# Patient Record
Sex: Male | Born: 1965 | Race: Black or African American | Hispanic: No | Marital: Married | Smoking: Never smoker
Health system: Southern US, Community
[De-identification: ages and names within clinical notes are randomized; demographics above are authoritative.]

## PROBLEM LIST (undated history)

## (undated) DIAGNOSIS — K219 Gastro-esophageal reflux disease without esophagitis: Secondary | ICD-10-CM

## (undated) DIAGNOSIS — K760 Fatty (change of) liver, not elsewhere classified: Secondary | ICD-10-CM

## (undated) DIAGNOSIS — J45909 Unspecified asthma, uncomplicated: Secondary | ICD-10-CM

## (undated) DIAGNOSIS — J189 Pneumonia, unspecified organism: Secondary | ICD-10-CM

## (undated) DIAGNOSIS — M4712 Other spondylosis with myelopathy, cervical region: Secondary | ICD-10-CM

## (undated) DIAGNOSIS — I10 Essential (primary) hypertension: Secondary | ICD-10-CM

## (undated) DIAGNOSIS — D649 Anemia, unspecified: Secondary | ICD-10-CM

## (undated) DIAGNOSIS — G610 Guillain-Barre syndrome: Secondary | ICD-10-CM

## (undated) DIAGNOSIS — R011 Cardiac murmur, unspecified: Secondary | ICD-10-CM

## (undated) HISTORY — PX: WISDOM TOOTH EXTRACTION: SHX21

## (undated) HISTORY — DX: Unspecified asthma, uncomplicated: J45.909

## (undated) HISTORY — DX: Fatty (change of) liver, not elsewhere classified: K76.0

## (undated) HISTORY — PX: NO PAST SURGERIES: SHX2092

---

## 1998-06-14 ENCOUNTER — Emergency Department (HOSPITAL_COMMUNITY): Admission: EM | Admit: 1998-06-14 | Discharge: 1998-06-14 | Payer: Self-pay | Admitting: Emergency Medicine

## 1998-06-15 ENCOUNTER — Emergency Department (HOSPITAL_COMMUNITY): Admission: EM | Admit: 1998-06-15 | Discharge: 1998-06-15 | Payer: Self-pay | Admitting: Emergency Medicine

## 1998-09-14 ENCOUNTER — Emergency Department (HOSPITAL_COMMUNITY): Admission: EM | Admit: 1998-09-14 | Discharge: 1998-09-14 | Payer: Self-pay | Admitting: Emergency Medicine

## 2001-03-07 ENCOUNTER — Emergency Department (HOSPITAL_COMMUNITY): Admission: EM | Admit: 2001-03-07 | Discharge: 2001-03-07 | Payer: Self-pay

## 2004-06-30 ENCOUNTER — Emergency Department (HOSPITAL_COMMUNITY): Admission: EM | Admit: 2004-06-30 | Discharge: 2004-06-30 | Payer: Self-pay | Admitting: Emergency Medicine

## 2009-02-15 ENCOUNTER — Emergency Department (HOSPITAL_BASED_OUTPATIENT_CLINIC_OR_DEPARTMENT_OTHER): Admission: EM | Admit: 2009-02-15 | Discharge: 2009-02-15 | Payer: Self-pay | Admitting: Emergency Medicine

## 2009-08-30 ENCOUNTER — Emergency Department (HOSPITAL_COMMUNITY): Admission: EM | Admit: 2009-08-30 | Discharge: 2009-08-30 | Payer: Self-pay | Admitting: Emergency Medicine

## 2010-02-03 ENCOUNTER — Emergency Department (HOSPITAL_COMMUNITY): Admission: EM | Admit: 2010-02-03 | Discharge: 2010-02-03 | Payer: Self-pay | Admitting: Emergency Medicine

## 2010-05-31 LAB — POCT I-STAT, CHEM 8
BUN: 16 mg/dL (ref 6–23)
Calcium, Ion: 1.11 mmol/L — ABNORMAL LOW (ref 1.12–1.32)
Chloride: 102 mEq/L (ref 96–112)
Creatinine, Ser: 1.5 mg/dL (ref 0.4–1.5)
Glucose, Bld: 104 mg/dL — ABNORMAL HIGH (ref 70–99)
HCT: 46 % (ref 39.0–52.0)
Hemoglobin: 15.6 g/dL (ref 13.0–17.0)
Potassium: 3.8 mEq/L (ref 3.5–5.1)
Sodium: 137 mEq/L (ref 135–145)
TCO2: 28 mmol/L (ref 0–100)

## 2010-06-22 LAB — POCT CARDIAC MARKERS
CKMB, poc: 1.1 ng/mL (ref 1.0–8.0)
CKMB, poc: <1 ng/mL — ABNORMAL LOW (ref 1.0–8.0)
Myoglobin, poc: 61.4 ng/mL (ref 12–200)
Myoglobin, poc: 65.7 ng/mL (ref 12–200)
Troponin i, poc: <0.05 ng/mL (ref 0.00–0.09)
Troponin i, poc: <0.05 ng/mL (ref 0.00–0.09)

## 2010-06-22 LAB — BASIC METABOLIC PANEL
BUN: 11 mg/dL (ref 6–23)
CO2: 28 mEq/L (ref 19–32)
Calcium: 9.2 mg/dL (ref 8.4–10.5)
Chloride: 103 mEq/L (ref 96–112)
Creatinine, Ser: 1 mg/dL (ref 0.4–1.5)
GFR calc Af Amer: >60 mL/min (ref >60)
GFR calc non Af Amer: >60 mL/min (ref >60)
Glucose, Bld: 105 mg/dL — ABNORMAL HIGH (ref 70–99)
Potassium: 4.2 mEq/L (ref 3.5–5.1)
Sodium: 140 mEq/L (ref 135–145)

## 2010-06-22 LAB — CBC
HCT: 40.2 % (ref 39.0–52.0)
Hemoglobin: 13.9 g/dL (ref 13.0–17.0)
MCHC: 34.5 g/dL (ref 30.0–36.0)
MCV: 90.3 fL (ref 78.0–100.0)
Platelets: 229 10*3/uL (ref 150–400)
RBC: 4.45 MIL/uL (ref 4.22–5.81)
RDW: 13.3 % (ref 11.5–15.5)
WBC: 5.9 10*3/uL (ref 4.0–10.5)

## 2010-11-24 ENCOUNTER — Other Ambulatory Visit: Payer: Self-pay

## 2010-11-24 ENCOUNTER — Emergency Department (INDEPENDENT_AMBULATORY_CARE_PROVIDER_SITE_OTHER): Payer: Self-pay

## 2010-11-24 ENCOUNTER — Emergency Department (HOSPITAL_BASED_OUTPATIENT_CLINIC_OR_DEPARTMENT_OTHER)
Admission: EM | Admit: 2010-11-24 | Discharge: 2010-11-24 | Disposition: A | Discharge status: Home / Self Care | Payer: Self-pay | Attending: Emergency Medicine | Admitting: Emergency Medicine

## 2010-11-24 DIAGNOSIS — I10 Essential (primary) hypertension: Secondary | ICD-10-CM | POA: Insufficient documentation

## 2010-11-24 DIAGNOSIS — R42 Dizziness and giddiness: Secondary | ICD-10-CM

## 2010-11-24 DIAGNOSIS — R0602 Shortness of breath: Secondary | ICD-10-CM | POA: Insufficient documentation

## 2010-11-24 DIAGNOSIS — R079 Chest pain, unspecified: Secondary | ICD-10-CM

## 2010-11-24 HISTORY — DX: Cardiac murmur, unspecified: R01.1

## 2010-11-24 HISTORY — DX: Essential (primary) hypertension: I10

## 2010-11-24 LAB — CBC
HCT: 41 % (ref 39.0–52.0)
Hemoglobin: 14.2 g/dL (ref 13.0–17.0)
MCV: 87.2 fL (ref 78.0–100.0)
RBC: 4.7 MIL/uL (ref 4.22–5.81)
WBC: 3.4 10*3/uL — ABNORMAL LOW (ref 4.0–10.5)

## 2010-11-24 LAB — CARDIAC PANEL(CRET KIN+CKTOT+MB+TROPI)
CK, MB: 2.3 ng/mL (ref 0.3–4.0)
Relative Index: 0.9 (ref 0.0–2.5)
Total CK: 263 U/L — ABNORMAL HIGH (ref 7–232)

## 2010-11-24 LAB — DIFFERENTIAL
Basophils Absolute: 0 10*3/uL (ref 0.0–0.1)
Eosinophils Relative: 1 % (ref 0–5)
Lymphocytes Relative: 26 % (ref 12–46)
Lymphs Abs: 0.9 10*3/uL (ref 0.7–4.0)
Monocytes Absolute: 0.4 10*3/uL (ref 0.1–1.0)
Neutro Abs: 2.1 10*3/uL (ref 1.7–7.7)

## 2010-11-24 LAB — BASIC METABOLIC PANEL
CO2: 26 mEq/L (ref 19–32)
Calcium: 9.5 mg/dL (ref 8.4–10.5)
Chloride: 99 mEq/L (ref 96–112)
Creatinine, Ser: 0.9 mg/dL (ref 0.50–1.35)
Glucose, Bld: 103 mg/dL — ABNORMAL HIGH (ref 70–99)

## 2010-11-24 NOTE — ED Notes (Signed)
C/o lightheaded, sob, head "pressure", chest tightness x days

## 2010-11-24 NOTE — ED Provider Notes (Signed)
History     CSN: [id number] Arrival date & time: 11/24/2010 11:25 AM  Chief Complaint  Patient presents with  . Dizziness  . Shortness of Breath   HPI Comments: Pt states that he is having intermittent, cp pressure, sob and lightheadedness:pt states that he has similar episode about 5 months ago and was seen in the er and sent home:pt states that he had a negative stress test with a cardiologist in [place] about 5 years ago:pt is not currently having any symptoms  Patient is a 45 y.o. male presenting with shortness of breath. The history is provided by the patient. No language interpreter was used.  Shortness of Breath  The current episode started 3 to 5 days ago. The onset was gradual. The problem occurs occasionally. The problem has been resolved. The problem is mild. The symptoms are relieved by nothing. The symptoms are aggravated by nothing. Associated symptoms include chest pressure and shortness of breath. Pertinent negatives include no fever. There was no intake of a foreign body. The Heimlich maneuver was not attempted. He was not exposed to toxic fumes. He has not inhaled smoke recently. He has had no prior hospitalizations. He has had no prior ICU admissions. He has had no prior intubations. He has been behaving normally. Urine output has been normal. There were no sick contacts.    Past Medical History  Diagnosis Date  . Hypertension   . Heart murmur     History reviewed. No pertinent past surgical history.  Family History  Problem Relation Age of Onset  . Hypertension Mother     History  Substance Use Topics  . Smoking status: Never Smoker   . Smokeless tobacco: Not on file  . Alcohol Use: No      Review of Systems  Constitutional: Negative for fever.  Respiratory: Positive for shortness of breath.   All other systems reviewed and are negative.    Physical Exam  BP 134/96  Pulse 68  Temp(Src) 98 F (36.7 C) (Oral)  Resp 20  Ht 5\' 11"  (1.803 m)  Wt  235 lb (106.595 kg)  BMI 32.78 kg/m2  SpO2 100%  Physical Exam  Nursing note and vitals reviewed. Constitutional: He is oriented to person, place, and time. He appears well-developed and well-nourished.  HENT:  Head: Atraumatic.  Eyes: Pupils are equal, round, and reactive to light.  Neck: Normal range of motion. Neck supple.  Cardiovascular: Normal rate and regular rhythm.   Pulmonary/Chest: Effort normal and breath sounds normal.  Abdominal: Soft. Bowel sounds are normal.  Musculoskeletal: Normal range of motion.  Neurological: He is alert and oriented to person, place, and time.  Skin: Skin is warm and dry.  Psychiatric: He has a normal mood and affect.    ED Course  Procedures Results for orders placed during the hospital encounter of 11/24/10  CBC      Component Value Range   WBC 3.4 (*) 4.0 - 10.5 (K/uL)   RBC 4.70  4.22 - 5.81 (MIL/uL)   Hemoglobin 14.2  13.0 - 17.0 (g/dL)   HCT 40.9  81.1 - 91.4 (%)   MCV 87.2  78.0 - 100.0 (fL)   MCH 30.2  26.0 - 34.0 (pg)   MCHC 34.6  30.0 - 36.0 (g/dL)   RDW 78.2  95.6 - 21.3 (%)   Platelets 240  150 - 400 (K/uL)  DIFFERENTIAL      Component Value Range   Neutrophils Relative 62  43 - 77 (%)  Neutro Abs 2.1  1.7 - 7.7 (K/uL)   Lymphocytes Relative 26  12 - 46 (%)   Lymphs Abs 0.9  0.7 - 4.0 (K/uL)   Monocytes Relative 11  3 - 12 (%)   Monocytes Absolute 0.4  0.1 - 1.0 (K/uL)   Eosinophils Relative 1  0 - 5 (%)   Eosinophils Absolute 0.0  0.0 - 0.7 (K/uL)   Basophils Relative 0  0 - 1 (%)   Basophils Absolute 0.0  0.0 - 0.1 (K/uL)  BASIC METABOLIC PANEL      Component Value Range   Sodium 136  135 - 145 (mEq/L)   Potassium 3.9  3.5 - 5.1 (mEq/L)   Chloride 99  96 - 112 (mEq/L)   CO2 26  19 - 32 (mEq/L)   Glucose, Bld 103 (*) 70 - 99 (mg/dL)   BUN 7  6 - 23 (mg/dL)   Creatinine, Ser 1.61  0.50 - 1.35 (mg/dL)   Calcium 9.5  8.4 - 09.6 (mg/dL)   GFR calc non Af Amer >60  >60 (mL/min)   GFR calc Af Amer >60  >60  (mL/min)  CARDIAC PANEL(CRET KIN+CKTOT+MB+TROPI)      Component Value Range   Total CK 263 (*) 7 - 232 (U/L)   CK, MB 2.3  0.3 - 4.0 (ng/mL)   Troponin I <0.30  <0.30 (ng/mL)   Relative Index 0.9  0.0 - 2.5    Dg Chest 2 View  11/24/2010  *RADIOLOGY REPORT*  Clinical Data: Chest pain and shortness of breath.  Dizziness and lightheadedness.  CHEST - 2 VIEW  Comparison: 08/30/2009  Findings: Heart size and mediastinal contours are normal.  Both lungs are clear.  No evidence of pleural effusion.  No mass or lymphadenopathy identified.  No significant change compared to prior exam.  IMPRESSION: Stable exam.  No active disease.  Original Report Authenticated By: Danae Orleans, M.D.    Date: 11/24/2010  Rate: 62  Rhythm: normal sinus rhythm  QRS Axis: normal  Intervals: normal  ST/T Wave abnormalities: normal  Conduction Disutrbances:none  Narrative Interpretation:   Old EKG Reviewed: none available   MDM Pt has been having symptoms for 3 days and pt is pain free at this time:will have pt follow up with his cardiologist for continued symptoms:pt not concerning for pe or pneumonia:no signs of failure noted   Medical screening examination/treatment/procedure(s) were performed by non-physician practitioner and as supervising physician I was immediately available for consultation/collaboration. Osvaldo Human, M.D.   Teressa Lower, NP 11/24/10 1531  Teressa Lower, NP 11/24/10 1533  Carleene Cooper III, MD 11/25/10 1027

## 2010-11-24 NOTE — ED Notes (Signed)
Pt amb to room 1 with quick steady gait in nad.  Pt reports feeling dizzy and at times sob since Monday, denies any dizzy feeling right now, or any sob.  Rt at bedside performing ekg and resp assessment during triage.

## 2010-11-24 NOTE — ED Notes (Signed)
Patient states he has had tightness in head and chest for 3 days. Patient states has no pain. I took vitals and had patient stay in waiting for nurse.

## 2011-09-11 ENCOUNTER — Encounter (HOSPITAL_BASED_OUTPATIENT_CLINIC_OR_DEPARTMENT_OTHER): Payer: Self-pay | Admitting: *Deleted

## 2011-09-11 ENCOUNTER — Emergency Department (HOSPITAL_BASED_OUTPATIENT_CLINIC_OR_DEPARTMENT_OTHER)
Admission: EM | Admit: 2011-09-11 | Discharge: 2011-09-11 | Disposition: A | Discharge status: Home / Self Care | Payer: 59 | Attending: Emergency Medicine | Admitting: Emergency Medicine

## 2011-09-11 DIAGNOSIS — I10 Essential (primary) hypertension: Secondary | ICD-10-CM | POA: Insufficient documentation

## 2011-09-11 DIAGNOSIS — J020 Streptococcal pharyngitis: Secondary | ICD-10-CM

## 2011-09-11 LAB — RAPID STREP SCREEN (MED CTR MEBANE ONLY): Streptococcus, Group A Screen (Direct): POSITIVE — AB

## 2011-09-11 MED ORDER — PENICILLIN G BENZATHINE 1200000 UNIT/2ML IM SUSP
1.2000 10*6.[IU] | Freq: Once | INTRAMUSCULAR | Status: AC
  Administered 2011-09-11: 1.2 10*6.[IU] via INTRAMUSCULAR
  Filled 2011-09-11: qty 2

## 2011-09-11 MED ORDER — IBUPROFEN 400 MG PO TABS
600.0000 mg | ORAL_TABLET | Freq: Once | ORAL | Status: AC
  Administered 2011-09-11: 600 mg via ORAL
  Filled 2011-09-11: qty 1

## 2011-09-11 NOTE — Discharge Instructions (Signed)

## 2011-09-11 NOTE — ED Provider Notes (Signed)
History     CSN: [id number]  Arrival date & time 09/11/11  0548   First MD Initiated Contact with Patient 09/11/11 [phone]      Chief Complaint  Patient presents with  . Sore Throat    (Consider location/radiation/quality/duration/timing/severity/associated sxs/prior treatment) HPI Comments: PT with sore throat since Wednesday, some HA, muscle achiness.  No N/V/D.  No sig coughing.  Has not taken any meds PTA, just trynig to tough it out to see if he would improve.  Pt's spouse now also sick with similar symptoms.  No prior h/o similar.    Patient is a 47 y.o. male presenting with pharyngitis. The history is provided by the patient and the spouse.  Sore Throat Associated symptoms include headaches.    Past Medical History  Diagnosis Date  . Hypertension   . Heart murmur     History reviewed. No pertinent past surgical history.  Family History  Problem Relation Age of Onset  . Hypertension Mother     History  Substance Use Topics  . Smoking status: Never Smoker   . Smokeless tobacco: Not on file  . Alcohol Use: No      Review of Systems  Constitutional: Positive for fever and chills.  HENT: Positive for sore throat. Negative for trouble swallowing and voice change.   Respiratory: Negative for cough.   Musculoskeletal: Positive for myalgias.  Skin: Negative for rash.  Neurological: Positive for headaches.    Allergies  Other  Home Medications   Current Outpatient Rx  Name Route Sig Dispense Refill  . EXFORGE PO Oral Take by mouth.        BP 149/96  Pulse 90  Temp 98.9 F (37.2 C)  SpO2 98%  Physical Exam  Nursing note and vitals reviewed. Constitutional: He appears well-developed and well-nourished. No distress.  HENT:  Mouth/Throat: Uvula is midline and mucous membranes are normal. Posterior oropharyngeal erythema present. No oropharyngeal exudate or tonsillar abscesses.  Neck: Phonation normal. Neck supple.  Pulmonary/Chest: Effort normal. No  stridor. No respiratory distress.  Lymphadenopathy:    He has cervical adenopathy.    ED Course  Procedures (including critical care time)  Labs Reviewed  RAPID STREP SCREEN - Abnormal; Notable for the following:    Streptococcus, Group A Screen (Direct) POSITIVE (*)     All other components within normal limits   No results found.   1. Strep pharyngitis       MDM  + strep, no sresp distress, will give IM PCN.  Work note.          Gavin Pound. Oletta Lamas, MD 09/11/11 [phone]

## 2011-09-11 NOTE — ED Notes (Signed)
Pt presents to ED today with sore throat and body aches since Wed.  Pt has tried no otc remedies PTA.

## 2011-12-24 ENCOUNTER — Emergency Department (HOSPITAL_BASED_OUTPATIENT_CLINIC_OR_DEPARTMENT_OTHER): Payer: 59

## 2011-12-24 ENCOUNTER — Emergency Department (HOSPITAL_BASED_OUTPATIENT_CLINIC_OR_DEPARTMENT_OTHER)
Admission: EM | Admit: 2011-12-24 | Discharge: 2011-12-24 | Disposition: A | Discharge status: Home / Self Care | Payer: 59 | Attending: Emergency Medicine | Admitting: Emergency Medicine

## 2011-12-24 ENCOUNTER — Encounter (HOSPITAL_BASED_OUTPATIENT_CLINIC_OR_DEPARTMENT_OTHER): Payer: Self-pay | Admitting: *Deleted

## 2011-12-24 DIAGNOSIS — Y9367 Activity, basketball: Secondary | ICD-10-CM | POA: Insufficient documentation

## 2011-12-24 DIAGNOSIS — Y998 Other external cause status: Secondary | ICD-10-CM | POA: Insufficient documentation

## 2011-12-24 DIAGNOSIS — I10 Essential (primary) hypertension: Secondary | ICD-10-CM | POA: Insufficient documentation

## 2011-12-24 DIAGNOSIS — W1801XA Striking against sports equipment with subsequent fall, initial encounter: Secondary | ICD-10-CM | POA: Insufficient documentation

## 2011-12-24 DIAGNOSIS — S62109A Fracture of unspecified carpal bone, unspecified wrist, initial encounter for closed fracture: Secondary | ICD-10-CM

## 2011-12-24 DIAGNOSIS — S52509A Unspecified fracture of the lower end of unspecified radius, initial encounter for closed fracture: Secondary | ICD-10-CM | POA: Insufficient documentation

## 2011-12-24 DIAGNOSIS — S52609A Unspecified fracture of lower end of unspecified ulna, initial encounter for closed fracture: Secondary | ICD-10-CM | POA: Insufficient documentation

## 2011-12-24 MED ORDER — OXYCODONE-ACETAMINOPHEN 5-325 MG PO TABS: 2.0000 | ORAL_TABLET | ORAL | Status: DC | PRN

## 2011-12-24 MED ORDER — OXYCODONE-ACETAMINOPHEN 5-325 MG PO TABS
2.0000 | ORAL_TABLET | Freq: Once | ORAL | Status: AC
  Administered 2011-12-24: 2 via ORAL
  Filled 2011-12-24 (×2): qty 2

## 2011-12-24 NOTE — ED Provider Notes (Signed)
History     CSN: [id number]  Arrival date & time 12/24/11  5284   First MD Initiated Contact with Patient 12/24/11 2032      Chief Complaint  Patient presents with  . Wrist Injury    (Consider location/radiation/quality/duration/timing/severity/associated sxs/prior treatment) Patient is a 46 y.o. male presenting with wrist pain. The history is provided by the patient. No language interpreter was used.  Wrist Pain This is a new problem. The current episode started today. The problem occurs constantly. Associated symptoms include joint swelling and myalgias. Nothing aggravates the symptoms. He has tried nothing for the symptoms. The treatment provided moderate relief.   Pt reports he was playing basketball and fell injuring her wrist Past Medical History  Diagnosis Date  . Hypertension   . Heart murmur     History reviewed. No pertinent past surgical history.  Family History  Problem Relation Age of Onset  . Hypertension Mother     History  Substance Use Topics  . Smoking status: Never Smoker   . Smokeless tobacco: Not on file  . Alcohol Use: No      Review of Systems  Musculoskeletal: Positive for myalgias and joint swelling.  All other systems reviewed and are negative.    Allergies  Other  Home Medications   Current Outpatient Rx  Name Route Sig Dispense Refill  . EXFORGE PO Oral Take by mouth.        BP 106/75  Pulse 84  Temp 98.9 F (37.2 C) (Oral)  Resp 20  Ht 5\' 11"  (1.803 m)  Wt 250 lb (113.399 kg)  BMI 34.87 kg/m2  SpO2 94%  Physical Exam  Nursing note and vitals reviewed. Constitutional: He is oriented to person, place, and time. He appears well-developed and well-nourished.  HENT:  Head: Normocephalic.  Musculoskeletal: He exhibits tenderness.       Swollen tender left wrist,  Decreased range of motion.  nv and ns intact  Neurological: He is alert and oriented to person, place, and time.  Skin: Skin is warm.  Psychiatric: He has a  normal mood and affect.    ED Course  Procedures (including critical care time)  Labs Reviewed - No data to display Dg Wrist Complete Left  12/24/2011  *RADIOLOGY REPORT*  Clinical Data: Post fall while playing basketball, now with pain and swelling  LEFT WRIST - COMPLETE 3+ VIEW  Comparison: None.  Findings:  There is a comminuted slightly impacted fracture of the distal radial metaphysis, apex volar, without definite intra-articular extension.   Minimally displaced fracture of the ulnar styloid process.  An osseous structures within the soft tissues dorsal to the distal carpal row may represent a concomitant triquetral fracture.  No definite fracture of the scaphoid.  No definite dislocation.  Expected adjacent soft tissue swelling and displace of the pronator quadratus fat pad.  No radiopaque foreign body.  IMPRESSION: 1.  Comminuted, slightly impacted fracture of the distal radial metaphysis without definite intra-articular extension. 2.  Minimally displaced fracture of the ulnar styloid process. 3.  Possible triquetral fracture.   Original Report Authenticated By: Waynard Reeds, M.D.      No diagnosis found.    MDM  I spoke to Dr. Melvyn Novas who advised to have pt see him in the office tomorrow at 4pm for evaluation.  Pt placed in a splint and sling        Lonia Skinner [place], [place] 12/24/11 2113

## 2011-12-24 NOTE — ED Notes (Signed)
PA at bedside.

## 2011-12-24 NOTE — ED Provider Notes (Signed)
Medical screening examination/treatment/procedure(s) were performed by non-physician practitioner and as supervising physician I was immediately available for consultation/collaboration.   Clevon Khader, MD 12/24/11 2328 

## 2011-12-24 NOTE — ED Notes (Signed)
Pt states he was playing bball and fell injuring his left wrist. Presents with obvious deformity. Ice, sling and splint applied at triage. +radial pulse. Moves fingers. Feels touch. Cap refill < 3 sec

## 2011-12-26 ENCOUNTER — Encounter (HOSPITAL_COMMUNITY): Payer: Self-pay | Admitting: *Deleted

## 2011-12-26 ENCOUNTER — Encounter (HOSPITAL_COMMUNITY): Payer: Self-pay | Admitting: Pharmacy Technician

## 2011-12-26 NOTE — Progress Notes (Signed)
I called and spoke with Wynona Canes at Dr. Bari Edward office and requested orders.

## 2011-12-27 ENCOUNTER — Encounter (HOSPITAL_COMMUNITY)
Admission: RE | Disposition: A | Discharge status: Home / Self Care | Payer: Self-pay | Source: Ambulatory Visit | Attending: Orthopedic Surgery

## 2011-12-27 ENCOUNTER — Ambulatory Visit (HOSPITAL_COMMUNITY)
Admission: RE | Admit: 2011-12-27 | Discharge: 2011-12-27 | Disposition: A | Discharge status: Home / Self Care | Payer: 59 | Source: Ambulatory Visit | Attending: Orthopedic Surgery | Admitting: Orthopedic Surgery

## 2011-12-27 ENCOUNTER — Encounter (HOSPITAL_COMMUNITY): Payer: Self-pay

## 2011-12-27 ENCOUNTER — Ambulatory Visit (HOSPITAL_COMMUNITY): Payer: 59

## 2011-12-27 DIAGNOSIS — Z01810 Encounter for preprocedural cardiovascular examination: Secondary | ICD-10-CM | POA: Insufficient documentation

## 2011-12-27 DIAGNOSIS — Z538 Procedure and treatment not carried out for other reasons: Secondary | ICD-10-CM | POA: Insufficient documentation

## 2011-12-27 SURGERY — CANCELLED PROCEDURE

## 2011-12-27 MED ORDER — CEFAZOLIN SODIUM-DEXTROSE 2-3 GM-% IV SOLR
2.0000 g | INTRAVENOUS | Status: DC
  Filled 2011-12-27: qty 50

## 2011-12-27 MED ORDER — CHLORHEXIDINE GLUCONATE 4 % EX LIQD: 60.0000 mL | Freq: Once | CUTANEOUS | Status: DC

## 2011-12-27 MED ORDER — MUPIROCIN 2 % EX OINT
TOPICAL_OINTMENT | Freq: Two times a day (BID) | CUTANEOUS | Status: DC
  Filled 2011-12-27 (×2): qty 22

## 2011-12-27 SURGICAL SUPPLY — 48 items
BANDAGE ELASTIC 3 VELCRO ST LF (GAUZE/BANDAGES/DRESSINGS) ×3 IMPLANT
BANDAGE ELASTIC 4 VELCRO ST LF (GAUZE/BANDAGES/DRESSINGS) ×3 IMPLANT
BANDAGE GAUZE ELAST BULKY 4 IN (GAUZE/BANDAGES/DRESSINGS) ×3 IMPLANT
BLADE SURG ROTATE 9660 (MISCELLANEOUS) IMPLANT
BNDG CMPR 9X4 STRL LF SNTH (GAUZE/BANDAGES/DRESSINGS) ×1
BNDG ESMARK 4X9 LF (GAUZE/BANDAGES/DRESSINGS) ×3 IMPLANT
CLOTH BEACON ORANGE TIMEOUT ST (SAFETY) ×3 IMPLANT
CORDS BIPOLAR (ELECTRODE) ×3 IMPLANT
COVER SURGICAL LIGHT HANDLE (MISCELLANEOUS) ×3 IMPLANT
CUFF TOURNIQUET SINGLE 18IN (TOURNIQUET CUFF) ×3 IMPLANT
CUFF TOURNIQUET SINGLE 24IN (TOURNIQUET CUFF) IMPLANT
DRAIN TLS ROUND 10FR (DRAIN) IMPLANT
DRAPE OEC MINIVIEW 54X84 (DRAPES) ×3 IMPLANT
DRAPE SURG 17X11 SM STRL (DRAPES) ×3 IMPLANT
DRSG ADAPTIC 3X8 NADH LF (GAUZE/BANDAGES/DRESSINGS) ×3 IMPLANT
ELECT REM PT RETURN 9FT ADLT (ELECTROSURGICAL)
ELECTRODE REM PT RTRN 9FT ADLT (ELECTROSURGICAL) IMPLANT
GAUZE SPONGE 4X4 16PLY XRAY LF (GAUZE/BANDAGES/DRESSINGS) ×3 IMPLANT
GLOVE BIOGEL PI IND STRL 8.5 (GLOVE) ×2 IMPLANT
GLOVE BIOGEL PI INDICATOR 8.5 (GLOVE) ×1
GLOVE SURG ORTHO 8.0 STRL STRW (GLOVE) ×3 IMPLANT
GOWN PREVENTION PLUS XLARGE (GOWN DISPOSABLE) ×3 IMPLANT
GOWN STRL NON-REIN LRG LVL3 (GOWN DISPOSABLE) ×9 IMPLANT
KIT BASIN OR (CUSTOM PROCEDURE TRAY) ×3 IMPLANT
KIT ROOM TURNOVER OR (KITS) ×3 IMPLANT
MANIFOLD NEPTUNE II (INSTRUMENTS) ×3 IMPLANT
NDL HYPO 25X1 1.5 SAFETY (NEEDLE) ×1 IMPLANT
NEEDLE HYPO 25X1 1.5 SAFETY (NEEDLE) ×2 IMPLANT
NS IRRIG 1000ML POUR BTL (IV SOLUTION) ×3 IMPLANT
PACK ORTHO EXTREMITY (CUSTOM PROCEDURE TRAY) ×3 IMPLANT
PAD ARMBOARD 7.5X6 YLW CONV (MISCELLANEOUS) ×6 IMPLANT
PAD CAST 4YDX4 CTTN HI CHSV (CAST SUPPLIES) ×2 IMPLANT
PADDING CAST COTTON 4X4 STRL (CAST SUPPLIES) ×2
SOAP 2 % CHG 4 OZ (WOUND CARE) ×3 IMPLANT
SPONGE GAUZE 4X4 12PLY (GAUZE/BANDAGES/DRESSINGS) ×3 IMPLANT
STRIP CLOSURE SKIN 1/2X4 (GAUZE/BANDAGES/DRESSINGS) IMPLANT
SUCTION FRAZIER TIP 10 FR DISP (SUCTIONS) ×3 IMPLANT
SUT ETHILON 4 0 PS 2 18 (SUTURE) ×3 IMPLANT
SUT MNCRL AB 4-0 PS2 18 (SUTURE) IMPLANT
SUT VIC AB 2-0 FS1 27 (SUTURE) ×3 IMPLANT
SUT VICRYL 4-0 PS2 18IN ABS (SUTURE) ×3 IMPLANT
SYR CONTROL 10ML LL (SYRINGE) IMPLANT
SYSTEM CHEST DRAIN TLS 7FR (DRAIN) IMPLANT
TOWEL OR 17X24 6PK STRL BLUE (TOWEL DISPOSABLE) ×3 IMPLANT
TOWEL OR 17X26 10 PK STRL BLUE (TOWEL DISPOSABLE) ×3 IMPLANT
TUBE CONNECTING 12X1/4 (SUCTIONS) ×3 IMPLANT
WATER STERILE IRR 1000ML POUR (IV SOLUTION) ×3 IMPLANT
YANKAUER SUCT BULB TIP NO VENT (SUCTIONS) IMPLANT

## 2011-12-27 NOTE — Progress Notes (Signed)
Dr. Orlan Leavens informed that the EKG was finished before I could stop it and he asked me to send it with patient. I informed him that I couldn't due to HIPPA restrictions and that Medical Records could fax it to the other location if they  call to request it.

## 2011-12-27 NOTE — Progress Notes (Signed)
Dr. Orlan Leavens called and asked how much we had done to get patient ready. I informed him that I was asking questions and we were doing an ekg. He said to hold off and asked to speak to patient.  Patient and patient's family spoke with Dr. Orlan Leavens then said that  Patient was to get dressed and that they were to go to another location for surgery.

## 2012-03-20 HISTORY — PX: WRIST SURGERY: SHX841

## 2012-08-24 DIAGNOSIS — R5381 Other malaise: Secondary | ICD-10-CM | POA: Insufficient documentation

## 2012-08-24 DIAGNOSIS — F411 Generalized anxiety disorder: Secondary | ICD-10-CM | POA: Insufficient documentation

## 2013-01-15 DIAGNOSIS — J45909 Unspecified asthma, uncomplicated: Secondary | ICD-10-CM | POA: Insufficient documentation

## 2013-01-15 DIAGNOSIS — J45901 Unspecified asthma with (acute) exacerbation: Secondary | ICD-10-CM | POA: Insufficient documentation

## 2013-10-15 ENCOUNTER — Emergency Department (HOSPITAL_COMMUNITY)
Admission: EM | Admit: 2013-10-15 | Discharge: 2013-10-15 | Disposition: A | Discharge status: Home / Self Care | Payer: BC Managed Care – PPO | Attending: Emergency Medicine | Admitting: Emergency Medicine

## 2013-10-15 ENCOUNTER — Encounter (HOSPITAL_COMMUNITY): Payer: Self-pay | Admitting: Emergency Medicine

## 2013-10-15 ENCOUNTER — Emergency Department (HOSPITAL_COMMUNITY): Payer: BC Managed Care – PPO

## 2013-10-15 DIAGNOSIS — R011 Cardiac murmur, unspecified: Secondary | ICD-10-CM | POA: Diagnosis not present

## 2013-10-15 DIAGNOSIS — I10 Essential (primary) hypertension: Secondary | ICD-10-CM | POA: Insufficient documentation

## 2013-10-15 DIAGNOSIS — Z23 Encounter for immunization: Secondary | ICD-10-CM | POA: Diagnosis not present

## 2013-10-15 DIAGNOSIS — Y9389 Activity, other specified: Secondary | ICD-10-CM | POA: Diagnosis not present

## 2013-10-15 DIAGNOSIS — S0181XA Laceration without foreign body of other part of head, initial encounter: Secondary | ICD-10-CM

## 2013-10-15 DIAGNOSIS — Y9289 Other specified places as the place of occurrence of the external cause: Secondary | ICD-10-CM | POA: Insufficient documentation

## 2013-10-15 DIAGNOSIS — S0180XA Unspecified open wound of other part of head, initial encounter: Secondary | ICD-10-CM | POA: Insufficient documentation

## 2013-10-15 DIAGNOSIS — Z79899 Other long term (current) drug therapy: Secondary | ICD-10-CM | POA: Diagnosis not present

## 2013-10-15 LAB — I-STAT CHEM 8, ED
BUN: 10 mg/dL (ref 6–23)
BUN: 12 mg/dL (ref 6–23)
CALCIUM ION: 0.96 mmol/L — AB (ref 1.12–1.23)
CALCIUM ION: 1.07 mmol/L — AB (ref 1.12–1.23)
Chloride: 106 mEq/L (ref 96–112)
Chloride: 110 mEq/L (ref 96–112)
Creatinine, Ser: 1.6 mg/dL — ABNORMAL HIGH (ref 0.50–1.35)
Creatinine, Ser: 1.8 mg/dL — ABNORMAL HIGH (ref 0.50–1.35)
GLUCOSE: 115 mg/dL — AB (ref 70–99)
Glucose, Bld: 120 mg/dL — ABNORMAL HIGH (ref 70–99)
HCT: 36 % — ABNORMAL LOW (ref 39.0–52.0)
HEMATOCRIT: 40 % (ref 39.0–52.0)
HEMOGLOBIN: 12.2 g/dL — AB (ref 13.0–17.0)
HEMOGLOBIN: 13.6 g/dL (ref 13.0–17.0)
POTASSIUM: 4.6 meq/L (ref 3.7–5.3)
Potassium: 3.6 mEq/L — ABNORMAL LOW (ref 3.7–5.3)
SODIUM: 140 meq/L (ref 137–147)
Sodium: 139 mEq/L (ref 137–147)
TCO2: 19 mmol/L (ref 0–100)
TCO2: 20 mmol/L (ref 0–100)

## 2013-10-15 LAB — CBC WITH DIFFERENTIAL/PLATELET
BASOS ABS: 0 10*3/uL (ref 0.0–0.1)
BASOS PCT: 0 % (ref 0–1)
EOS ABS: 0 10*3/uL (ref 0.0–0.7)
EOS PCT: 0 % (ref 0–5)
HEMATOCRIT: 35.8 % — AB (ref 39.0–52.0)
Hemoglobin: 11.8 g/dL — ABNORMAL LOW (ref 13.0–17.0)
Lymphocytes Relative: 21 % (ref 12–46)
Lymphs Abs: 2 10*3/uL (ref 0.7–4.0)
MCH: 31 pg (ref 26.0–34.0)
MCHC: 33 g/dL (ref 30.0–36.0)
MCV: 94 fL (ref 78.0–100.0)
MONO ABS: 0.3 10*3/uL (ref 0.1–1.0)
Monocytes Relative: 3 % (ref 3–12)
Neutro Abs: 7.4 10*3/uL (ref 1.7–7.7)
Neutrophils Relative %: 76 % (ref 43–77)
PLATELETS: 280 10*3/uL (ref 150–400)
RBC: 3.81 MIL/uL — ABNORMAL LOW (ref 4.22–5.81)
RDW: 13.6 % (ref 11.5–15.5)
WBC: 9.8 10*3/uL (ref 4.0–10.5)

## 2013-10-15 MED ORDER — IOHEXOL 300 MG/ML  SOLN
100.0000 mL | Freq: Once | INTRAMUSCULAR | Status: AC | PRN
  Administered 2013-10-15: 100 mL via INTRAVENOUS

## 2013-10-15 MED ORDER — TRAMADOL HCL 50 MG PO TABS: 50.0000 mg | ORAL_TABLET | Freq: Four times a day (QID) | ORAL | Status: DC | PRN

## 2013-10-15 MED ORDER — LIDOCAINE-EPINEPHRINE-TETRACAINE (LET) SOLUTION
3.0000 mL | Freq: Once | NASAL | Status: AC
  Administered 2013-10-15: 3 mL via TOPICAL
  Filled 2013-10-15: qty 3

## 2013-10-15 MED ORDER — SODIUM CHLORIDE 0.9 % IV BOLUS (SEPSIS)
2000.0000 mL | Freq: Once | INTRAVENOUS | Status: AC
Start: 2013-10-15 — End: 2013-10-15
  Administered 2013-10-15: 2000 mL via INTRAVENOUS

## 2013-10-15 MED ORDER — TETANUS-DIPHTH-ACELL PERTUSSIS 5-2.5-18.5 LF-MCG/0.5 IM SUSP
0.5000 mL | Freq: Once | INTRAMUSCULAR | Status: AC
  Administered 2013-10-15: 0.5 mL via INTRAMUSCULAR
  Filled 2013-10-15: qty 0.5

## 2013-10-15 NOTE — Discharge Instructions (Signed)
Facial Laceration  A facial laceration is a cut on the face. These injuries can be painful and cause bleeding. Lacerations usually heal quickly, but they need special care to reduce scarring. DIAGNOSIS  Your health care provider will take a medical history, ask for details about how the injury occurred, and examine the wound to determine how deep the cut is. TREATMENT  Some facial lacerations may not require closure. Others may not be able to be closed because of an increased risk of infection. The risk of infection and the chance for successful closure will depend on various factors, including the amount of time since the injury occurred. The wound may be cleaned to help prevent infection. If closure is appropriate, pain medicines may be given if needed. Your health care provider will use stitches (sutures), wound glue (adhesive), or skin adhesive strips to repair the laceration. These tools bring the skin edges together to allow for faster healing and a better cosmetic outcome. If needed, you may also be given a tetanus shot. HOME CARE INSTRUCTIONS  Only take over-the-counter or prescription medicines as directed by your health care provider.  Follow your health care provider's instructions for wound care. These instructions will vary depending on the technique used for closing the wound. For Sutures:  Keep the wound clean and dry.   If you were given a bandage (dressing), you should change it at least once a day. Also change the dressing if it becomes wet or dirty, or as directed by your health care provider.   Wash the wound with soap and water 2 times a day. Rinse the wound off with water to remove all soap. Pat the wound dry with a clean towel.   After cleaning, apply a thin layer of the antibiotic ointment recommended by your health care provider. This will help prevent infection and keep the dressing from sticking.   You may shower as usual after the first 24 hours. Do not soak the  wound in water until the sutures are removed.   Get your sutures removed as directed by your health care provider. With facial lacerations, sutures should usually be taken out after 4-5 days to avoid stitch marks.   Wait a few days after your sutures are removed before applying any makeup. For Skin Adhesive Strips:  Keep the wound clean and dry.   Do not get the skin adhesive strips wet. You may bathe carefully, using caution to keep the wound dry.   If the wound gets wet, pat it dry with a clean towel.   Skin adhesive strips will fall off on their own. You may trim the strips as the wound heals. Do not remove skin adhesive strips that are still stuck to the wound. They will fall off in time.  For Wound Adhesive:  You may briefly wet your wound in the shower or bath. Do not soak or scrub the wound. Do not swim. Avoid periods of heavy sweating until the skin adhesive has fallen off on its own. After showering or bathing, gently pat the wound dry with a clean towel.   Do not apply liquid medicine, cream medicine, ointment medicine, or makeup to your wound while the skin adhesive is in place. This may loosen the film before your wound is healed.   If a dressing is placed over the wound, be careful not to apply tape directly over the skin adhesive. This may cause the adhesive to be pulled off before the wound is healed.   Avoid   prolonged exposure to sunlight or tanning lamps while the skin adhesive is in place.  The skin adhesive will usually remain in place for 5-10 days, then naturally fall off the skin. Do not pick at the adhesive film.  After Healing: Once the wound has healed, cover the wound with sunscreen during the day for 1 full year. This can help minimize scarring. Exposure to ultraviolet light in the first year will darken the scar. It can take 1-2 years for the scar to lose its redness and to heal completely.  SEEK IMMEDIATE MEDICAL CARE IF:  You have redness, pain, or  swelling around the wound.   You see ayellowish-white fluid (pus) coming from the wound.   You have chills or a fever.  MAKE SURE YOU:  Understand these instructions.  Will watch your condition.  Will get help right away if you are not doing well or get worse. Document Released: 04/13/2004 Document Revised: 12/25/2012 Document Reviewed: 10/17/2012 Exit[employer] Patient Information 2015 [employer]. This information is not intended to replace advice given to you by your health care provider. Make sure you discuss any questions you have with your health care provider.  
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
## 2013-10-15 NOTE — ED Provider Notes (Signed)
Medical screening examination/treatment/procedure(s) were performed by non-physician practitioner and as supervising physician I was immediately available for consultation/collaboration.   EKG Interpretation None       Selso Mannor K Nevaeh Korte-Rasch, MD 10/15/13 2314 

## 2013-10-15 NOTE — ED Provider Notes (Signed)
LACERATION REPAIR Performed by: Raymon MuttonSciacca, Sinclaire Artiga Authorized by: Raymon MuttonSciacca, Gresham Caetano Consent: Verbal consent obtained. Risks and benefits: risks, benefits and alternatives were discussed Consent given by: patient Patient identity confirmed: provided demographic data Prepped and Draped in normal sterile fashion Wound explored Laceration Location: right eyebrow Laceration Length: 3.5 cm No Foreign Bodies seen or palpated Anesthesia: local infiltration Local anesthetic: lidocaine 2% with epinephrine Anesthetic total: 5 ml Irrigation method: syringe Amount of cleaning: standard Skin closure: approximate Number of sutures: 7 Technique: single interrupted, 6-0 Prolene Patient tolerance: Patient tolerated the procedure well with no immediate complications. Good hemostasis throughout the procedure. Negative focal neurological deficits noted - cranial nerves grossly intact. Discussed wound care. Discussed with patient that sutures need to be removed within 5 days, no more than 7 days. Discussed with patient to closely monitor symptoms and if symptoms are to worsen or change to report back to the ED - strict return instructions given.  Patient agreed to plan of care, understood, all questions answered.    Raymon MuttonMarissa Carreen Milius, PA-C 10/15/13 [phone]

## 2013-10-27 ENCOUNTER — Emergency Department (HOSPITAL_BASED_OUTPATIENT_CLINIC_OR_DEPARTMENT_OTHER)
Admission: EM | Admit: 2013-10-27 | Discharge: 2013-10-27 | Disposition: A | Discharge status: Home / Self Care | Payer: BC Managed Care – PPO | Attending: Emergency Medicine | Admitting: Emergency Medicine

## 2013-10-27 ENCOUNTER — Encounter (HOSPITAL_BASED_OUTPATIENT_CLINIC_OR_DEPARTMENT_OTHER): Payer: Self-pay | Admitting: Emergency Medicine

## 2013-10-27 DIAGNOSIS — I10 Essential (primary) hypertension: Secondary | ICD-10-CM | POA: Diagnosis not present

## 2013-10-27 DIAGNOSIS — Z87828 Personal history of other (healed) physical injury and trauma: Secondary | ICD-10-CM | POA: Diagnosis not present

## 2013-10-27 DIAGNOSIS — Z4802 Encounter for removal of sutures: Secondary | ICD-10-CM | POA: Insufficient documentation

## 2013-10-27 DIAGNOSIS — M25569 Pain in unspecified knee: Secondary | ICD-10-CM | POA: Insufficient documentation

## 2013-10-27 DIAGNOSIS — S8011XS Contusion of right lower leg, sequela: Secondary | ICD-10-CM

## 2013-10-27 DIAGNOSIS — R000 Tachycardia, unspecified: Secondary | ICD-10-CM | POA: Diagnosis not present

## 2013-10-27 DIAGNOSIS — Z79899 Other long term (current) drug therapy: Secondary | ICD-10-CM | POA: Insufficient documentation

## 2013-10-27 MED ORDER — IBUPROFEN 800 MG PO TABS: 800.0000 mg | ORAL_TABLET | Freq: Three times a day (TID) | ORAL | Status: DC

## 2013-10-27 NOTE — ED Provider Notes (Signed)
Medical screening examination/treatment/procedure(s) were performed by non-physician practitioner and as supervising physician I was immediately available for consultation/collaboration.   EKG Interpretation None        Jorge ChurnJohn David Rushie Brazel III, MD 10/27/13 [phone]

## 2013-10-27 NOTE — Discharge Instructions (Signed)
Suture Removal, Care After Refer to this sheet in the next few weeks. These instructions provide you with information on caring for yourself after your procedure. Your health care provider may also give you more specific instructions. Your treatment has been planned according to current medical practices, but problems sometimes occur. Call your health care provider if you have any problems or questions after your procedure. WHAT TO EXPECT AFTER THE PROCEDURE After your stitches (sutures) are removed, it is typical to have the following:  Some discomfort and swelling in the wound area.  Slight redness in the area. HOME CARE INSTRUCTIONS   If you have skin adhesive strips over the wound area, do not take the strips off. They will fall off on their own in a few days. If the strips remain in place after 14 days, you may remove them.  Change any bandages (dressings) at least once a day or as directed by your health care provider. If the bandage sticks, soak it off with warm, soapy water.  Apply cream or ointment only as directed by your health care provider. If using cream or ointment, wash the area with soap and water 2 times a day to remove all the cream or ointment. Rinse off the soap and pat the area dry with a clean towel.  Keep the wound area dry and clean. If the bandage becomes wet or dirty, or if it develops a bad smell, change it as soon as possible.  Continue to protect the wound from injury.  Use sunscreen when out in the sun. New scars become sunburned easily. SEEK MEDICAL CARE IF:  You have increasing redness, swelling, or pain in the wound.  You see pus coming from the wound.  You have a fever.  You notice a bad smell coming from the wound or dressing.  Your wound breaks open (edges not staying together). Document Released: 11/29/2000 Document Revised: 12/25/2012 Document Reviewed: 10/16/2012 [facility] Patient Information 2015 [place], [place]. This information is not  intended to replace advice given to you by your health care provider. Make sure you discuss any questions you have with your health care provider. Contusion A contusion is a deep bruise. Contusions are the result of an injury that caused bleeding under the skin. The contusion may turn blue, purple, or yellow. Minor injuries will give you a painless contusion, but more severe contusions may stay painful and swollen for a few weeks.  CAUSES  A contusion is usually caused by a blow, trauma, or direct force to an area of the body. SYMPTOMS   Swelling and redness of the injured area.  Bruising of the injured area.  Tenderness and soreness of the injured area.  Pain. DIAGNOSIS  The diagnosis can be made by taking a history and physical exam. An X-ray, CT scan, or MRI may be needed to determine if there were any associated injuries, such as fractures. TREATMENT  Specific treatment will depend on what area of the body was injured. In general, the best treatment for a contusion is resting, icing, elevating, and applying cold compresses to the injured area. Over-the-counter medicines may also be recommended for pain control. Ask your caregiver what the best treatment is for your contusion. HOME CARE INSTRUCTIONS   Put ice on the injured area.  Put ice in a plastic bag.  Place a towel between your skin and the bag.  Leave the ice on for 15-20 minutes, 3-4 times a day, or as directed by your health care provider.  Only  take over-the-counter or prescription medicines for pain, discomfort, or fever as directed by your caregiver. Your caregiver may recommend avoiding anti-inflammatory medicines (aspirin, ibuprofen, and naproxen) for 48 hours because these medicines may increase bruising.  Rest the injured area.  If possible, elevate the injured area to reduce swelling. SEEK IMMEDIATE MEDICAL CARE IF:   You have increased bruising or swelling.  You have pain that is getting worse.  Your  swelling or pain is not relieved with medicines. MAKE SURE YOU:   Understand these instructions.  Will watch your condition.  Will get help right away if you are not doing well or get worse. Document Released: 12/14/2004 Document Revised: 03/11/2013 Document Reviewed: 01/09/2011 [facility] Patient Information 2015 [place]. This information is not intended to replace advice given to you by your health care provider. Make sure you discuss any questions you have with your health care provider.
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
## 2014-04-02 ENCOUNTER — Encounter (HOSPITAL_COMMUNITY): Payer: Self-pay | Admitting: Orthopedic Surgery

## 2014-05-21 DIAGNOSIS — J40 Bronchitis, not specified as acute or chronic: Secondary | ICD-10-CM | POA: Insufficient documentation

## 2014-05-21 DIAGNOSIS — F1010 Alcohol abuse, uncomplicated: Secondary | ICD-10-CM | POA: Insufficient documentation

## 2015-08-23 DIAGNOSIS — F5101 Primary insomnia: Secondary | ICD-10-CM | POA: Diagnosis not present

## 2015-08-23 DIAGNOSIS — Z6838 Body mass index (BMI) 38.0-38.9, adult: Secondary | ICD-10-CM | POA: Diagnosis not present

## 2015-08-23 DIAGNOSIS — I10 Essential (primary) hypertension: Secondary | ICD-10-CM | POA: Diagnosis not present

## 2015-12-13 DIAGNOSIS — I10 Essential (primary) hypertension: Secondary | ICD-10-CM | POA: Diagnosis not present

## 2015-12-13 DIAGNOSIS — R42 Dizziness and giddiness: Secondary | ICD-10-CM | POA: Diagnosis not present

## 2015-12-13 DIAGNOSIS — J40 Bronchitis, not specified as acute or chronic: Secondary | ICD-10-CM | POA: Diagnosis not present

## 2015-12-13 DIAGNOSIS — Z6838 Body mass index (BMI) 38.0-38.9, adult: Secondary | ICD-10-CM | POA: Diagnosis not present

## 2016-01-03 DIAGNOSIS — I10 Essential (primary) hypertension: Secondary | ICD-10-CM | POA: Diagnosis not present

## 2016-01-03 DIAGNOSIS — Z6841 Body Mass Index (BMI) 40.0 and over, adult: Secondary | ICD-10-CM | POA: Diagnosis not present

## 2016-04-11 DIAGNOSIS — K122 Cellulitis and abscess of mouth: Secondary | ICD-10-CM | POA: Diagnosis not present

## 2016-04-11 DIAGNOSIS — Z6838 Body mass index (BMI) 38.0-38.9, adult: Secondary | ICD-10-CM | POA: Diagnosis not present

## 2016-04-11 DIAGNOSIS — J029 Acute pharyngitis, unspecified: Secondary | ICD-10-CM | POA: Diagnosis not present

## 2016-05-15 ENCOUNTER — Ambulatory Visit (HOSPITAL_COMMUNITY)
Admission: EM | Admit: 2016-05-15 | Discharge: 2016-05-15 | Disposition: A | Discharge status: Home / Self Care | Payer: BLUE CROSS/BLUE SHIELD | Attending: Family Medicine | Admitting: Family Medicine

## 2016-05-15 ENCOUNTER — Encounter (HOSPITAL_COMMUNITY): Payer: Self-pay | Admitting: Family Medicine

## 2016-05-15 DIAGNOSIS — R079 Chest pain, unspecified: Secondary | ICD-10-CM

## 2016-05-15 DIAGNOSIS — I10 Essential (primary) hypertension: Secondary | ICD-10-CM | POA: Diagnosis not present

## 2016-05-15 DIAGNOSIS — K2100 Gastro-esophageal reflux disease with esophagitis, without bleeding: Secondary | ICD-10-CM

## 2016-05-15 DIAGNOSIS — R42 Dizziness and giddiness: Secondary | ICD-10-CM

## 2016-05-15 DIAGNOSIS — G43A0 Cyclical vomiting, not intractable: Secondary | ICD-10-CM | POA: Diagnosis not present

## 2016-05-15 DIAGNOSIS — K2900 Acute gastritis without bleeding: Secondary | ICD-10-CM | POA: Diagnosis not present

## 2016-05-15 DIAGNOSIS — R1115 Cyclical vomiting syndrome unrelated to migraine: Secondary | ICD-10-CM

## 2016-05-15 MED ORDER — ONDANSETRON 8 MG PO TBDP: 8.0000 mg | ORAL_TABLET | Freq: Three times a day (TID) | ORAL | 0 refills | Status: DC | PRN

## 2016-05-15 MED ORDER — GI COCKTAIL ~~LOC~~
ORAL | Status: AC
  Filled 2016-05-15: qty 30

## 2016-05-15 MED ORDER — ONDANSETRON 4 MG PO TBDP
ORAL_TABLET | ORAL | Status: AC
  Filled 2016-05-15: qty 1

## 2016-05-15 MED ORDER — GI COCKTAIL ~~LOC~~
30.0000 mL | Freq: Once | ORAL | Status: AC
  Administered 2016-05-15: 30 mL via ORAL

## 2016-05-15 MED ORDER — ONDANSETRON 4 MG PO TBDP
4.0000 mg | ORAL_TABLET | Freq: Once | ORAL | Status: AC
  Administered 2016-05-15: 4 mg via ORAL

## 2016-05-15 MED ORDER — OMEPRAZOLE 20 MG PO CPDR: 20.0000 mg | DELAYED_RELEASE_CAPSULE | Freq: Every day | ORAL | 0 refills | Status: DC

## 2016-05-15 MED ORDER — MECLIZINE HCL 25 MG PO TABS: 25.0000 mg | ORAL_TABLET | Freq: Three times a day (TID) | ORAL | 0 refills | Status: DC | PRN

## 2016-05-15 NOTE — ED Provider Notes (Signed)
CSN: [id number]     Arrival date & time 05/15/16  1428 History   First MD Initiated Contact with Patient 05/15/16 1505     Chief Complaint  Patient presents with  . Chest Pain  . Cough   (Consider location/radiation/quality/duration/timing/severity/associated sxs/prior Treatment) Patient c/o nausea vomiting yesterday and today.  He is c/o chest pain.  He is c/o nausea that is mild.  He is c/o vertigo.   The history is provided by the patient.  Chest Pain  Pain location:  Epigastric Pain quality: aching   Pain radiates to:  Epigastrium Pain severity:  Mild Onset quality:  Sudden Duration:  2 days Timing:  Constant Progression:  Worsening Chronicity:  New Relieved by:  Nothing Worsened by:  Nothing Ineffective treatments:  None tried Associated symptoms: cough, dizziness and nausea   Cough  Associated symptoms: chest pain     Past Medical History:  Diagnosis Date  . Heart murmur   . Hypertension    Past Surgical History:  Procedure Laterality Date  . NO PAST SURGERIES     Family History  Problem Relation Age of Onset  . Hypertension Mother    Social History  Substance Use Topics  . Smoking status: Never Smoker  . Smokeless tobacco: Never Used  . Alcohol use 1.8 oz/week    3 Cans of beer per week    Review of Systems  Constitutional: Negative.   HENT: Negative.   Eyes: Negative.   Respiratory: Positive for cough.   Cardiovascular: Positive for chest pain.  Gastrointestinal: Positive for nausea. Negative for diarrhea.  Endocrine: Negative.   Genitourinary: Negative.   Skin: Negative.   Neurological: Positive for dizziness.  Hematological: Negative.   Psychiatric/Behavioral: Negative.     Allergies  Other  Home Medications   Prior to Admission medications   Medication Sig Start Date End Date Taking? Authorizing Provider  amLODipine-valsartan (EXFORGE) 5-320 MG per tablet Take 1 tablet by mouth daily.    Historical Provider, MD  ibuprofen  (ADVIL,MOTRIN) 800 MG tablet Take 1 tablet (800 mg total) by mouth 3 (three) times daily. 10/27/13   Elson AreasLeslie K Sofia, PA-C  meclizine (ANTIVERT) 25 MG tablet Take 1 tablet (25 mg total) by mouth 3 (three) times daily as needed for dizziness. 05/15/16   Deatra CanterWilliam J Connelly Spruell, FNP  omeprazole (PRILOSEC) 20 MG capsule Take 1 capsule (20 mg total) by mouth daily. 05/15/16   Deatra CanterWilliam J Aiken [place]hem, FNP  ondansetron (ZOFRAN ODT) 8 MG disintegrating tablet Take 1 tablet (8 mg total) by mouth every 8 (eight) hours as needed for nausea or vomiting. 05/15/16   Deatra CanterWilliam J Sabien Umland, FNP  traMADol (ULTRAM) 50 MG tablet Take 1 tablet (50 mg total) by mouth every 6 (six) hours as needed. 10/15/13   April Palumbo, MD   Meds Ordered and Administered this Visit   Medications  ondansetron (ZOFRAN-ODT) disintegrating tablet 4 mg (4 mg Oral Given 05/15/16 1550)  gi cocktail (Maalox,Lidocaine,Donnatal) (30 mLs Oral Given 05/15/16 1549)    BP (!) 146/101   Pulse 111   Temp 98.7 F (37.1 C)   Resp 18   SpO2 100%  No data found.   Physical Exam  Constitutional: He is oriented to person, place, and time. He appears well-developed and well-nourished.  HENT:  Head: Normocephalic and atraumatic.  Right Ear: External ear normal.  Left Ear: External ear normal.  Mouth/Throat: Oropharynx is clear and moist.  Eyes: Conjunctivae are normal. Pupils are equal, round, and reactive to light.  Neck:  Normal range of motion. Neck supple.  Cardiovascular: Normal rate, regular rhythm and normal heart sounds.   Pulmonary/Chest: Effort normal and breath sounds normal.  Abdominal: Soft. Bowel sounds are normal. There is tenderness.  Epigastric discomfort  Neurological: He is alert and oriented to person, place, and time.  Nursing note and vitals reviewed.   Urgent Care Course     Procedures (including critical care time)  Labs Review Labs Reviewed - No data to display  Imaging Review No results found.   Visual Acuity Review  Right  Eye Distance:   Left Eye Distance:   Bilateral Distance:    Right Eye Near:   Left Eye Near:    Bilateral Near:         MDM   1. Acute gastritis without hemorrhage, unspecified gastritis type   2. GERD with esophagitis   3. Non-intractable cyclical vomiting with nausea   4. Vertigo   5. Essential hypertension    Antivert 25mg  one po tid prn #30 Prilosec 20mg  one po qd #30 Zofran 8mg  one po tid prn #21  Follow up with PCP for HTN  Push po fluids, rest, tylenol and motrin otc prn as directed for fever, arthralgias, and myalgias.  Follow up prn if sx's continue or persist.    Deatra Canter, FNP 05/15/16 [phone]

## 2016-05-15 NOTE — ED Triage Notes (Signed)
Pt here for cough, chest pain and light headed for the past week.

## 2017-01-17 DIAGNOSIS — R7309 Other abnormal glucose: Secondary | ICD-10-CM | POA: Diagnosis not present

## 2017-01-17 DIAGNOSIS — I10 Essential (primary) hypertension: Secondary | ICD-10-CM | POA: Diagnosis not present

## 2017-01-17 DIAGNOSIS — Z13220 Encounter for screening for lipoid disorders: Secondary | ICD-10-CM | POA: Diagnosis not present

## 2017-01-18 DIAGNOSIS — R7303 Prediabetes: Secondary | ICD-10-CM | POA: Insufficient documentation

## 2017-03-28 ENCOUNTER — Encounter: Payer: Self-pay | Admitting: Family Medicine

## 2017-03-28 ENCOUNTER — Ambulatory Visit: Payer: BLUE CROSS/BLUE SHIELD | Admitting: Family Medicine

## 2017-03-28 VITALS — BP 132/78 | HR 84 | Temp 98.6°F | Ht 70.0 in | Wt 294.4 lb

## 2017-03-28 DIAGNOSIS — Z1211 Encounter for screening for malignant neoplasm of colon: Secondary | ICD-10-CM

## 2017-03-28 DIAGNOSIS — J181 Lobar pneumonia, unspecified organism: Secondary | ICD-10-CM | POA: Diagnosis not present

## 2017-03-28 DIAGNOSIS — R0681 Apnea, not elsewhere classified: Secondary | ICD-10-CM

## 2017-03-28 DIAGNOSIS — M7711 Lateral epicondylitis, right elbow: Secondary | ICD-10-CM | POA: Diagnosis not present

## 2017-03-28 DIAGNOSIS — J189 Pneumonia, unspecified organism: Secondary | ICD-10-CM

## 2017-03-28 MED ORDER — AZITHROMYCIN 250 MG PO TABS: ORAL_TABLET | ORAL | 0 refills | Status: DC

## 2017-03-28 NOTE — Patient Instructions (Addendum)
Continue to push fluids, practice good hand hygiene, and cover your mouth if you cough.  If you start having fevers, shaking or shortness of breath, seek immediate care.  Forearm strap for tennis elbow- Band-It is a brand you could try.   Ice/cold pack over area for 10-15 min every 2-3 hours while awake.  Ibuprofen 400-600 mg (2-3 over the counter strength tabs) every 6 hours as needed for pain.  OK to take Tylenol 1000 mg (2 extra strength tabs) or 975 mg (3 regular strength tabs) every 6 hours as needed.  Aim to do some physical exertion for 150 minutes per week. This is typically divided into 5 days per week, 30 minutes per day. The activity should be enough to get your heart rate up. Anything is better than nothing if you have time constraints.  If you do not hear anything about your referrals in the next 1-2 weeks, call our office and ask for an update.

## 2017-03-28 NOTE — Progress Notes (Signed)
Chief Complaint  Patient presents with  . Establish Care       New Patient Visit SUBJECTIVE: HPI: Jorge Weaver is an 52 y.o.male who is being seen for establishing care.  The patient has not had pcp in many years.  R outer elbow pain that started 2 mo ago. No injury or change in activity. He works with maintenance. Sharp pain. 4/10. No radiation.  No numbness, tingling, weakness, bruising, swelling. Ibuprofen, Tylenol, Advil, icy hot equivalent.   Duration: 3 weeks  Associated symptoms: itchy watery eyes, ear fullness and cough Denies: sinus congestion, sinus pain, rhinorrhea, ear pain, ear drainage, sore throat, shortness of breath, myalgia and fevers/rigors Treatment to date: Nyquil, Alkaseltzer Sick contacts: Yes  + apneic episodes, snoring, daytime fatigue. He was supposed to have a sleep study around 7-8 years ago, but never got one.   Allergies  Allergen Reactions  . Other     Pt denies any drug allegies   Past Medical History:  Diagnosis Date  . Heart murmur   . Hypertension    Past Surgical History:  Procedure Laterality Date  . NO PAST SURGERIES    . WRIST SURGERY Left 2014   Social History   Socioeconomic History  . Marital status: Married  Tobacco Use  . Smoking status: Never Smoker  . Smokeless tobacco: Never Used  Substance and Sexual Activity  . Alcohol use: Yes    Alcohol/week: 1.8 oz    Types: 3 Cans of beer per week  . Drug use: No   Family History  Problem Relation Age of Onset  . Hypertension Mother      Current Outpatient Medications:  .  amLODipine (NORVASC) 10 MG tablet, Take 10 mg by mouth daily., Disp: , Rfl:  .  hydrochlorothiazide (HYDRODIURIL) 25 MG tablet, Take 25 mg by mouth daily., Disp: , Rfl:  .  lisinopril (PRINIVIL,ZESTRIL) 20 MG tablet, Take 20 mg by mouth daily., Disp: , Rfl:  .  azithromycin (ZITHROMAX) 250 MG tablet, Take 2 tabs the first day and then 1 tab daily until you run out., Disp: 6 tablet, Rfl:  0  ROS Cardiovascular: Denies chest pain  Respiratory: +cough   OBJECTIVE: BP 132/78 (BP Location: Left Arm, Patient Position: Sitting, Cuff Size: Large)   Pulse 84   Temp 98.6 F (37 C) (Oral)   Ht 5\' 10"  (1.778 m)   Wt 294 lb 6 oz (133.5 kg)   SpO2 95%   BMI 42.24 kg/m   Constitutional: -  VS reviewed -  Well developed, well nourished, appears stated age -  No apparent distress  Psychiatric: -  Oriented to person, place, and time -  Memory intact -  Affect and mood normal -  Fluent conversation, good eye contact -  Judgment and insight age appropriate  Eye: -  Conjunctivae clear, no discharge -  Pupils symmetric, round, reactive to light  ENMT: -  MMM    Pharynx moist, no exudate, no erythema  Neck: -  No gross swelling, no palpable masses -  Thyroid midline, not enlarged, mobile, no palpable masses  Cardiovascular: -  RRR -  No LE edema  Respiratory: -  Normal respiratory effort, no accessory muscle use, no retraction -  +LLL coarse breath sounds  Neurological:  -  CN II - XII grossly intact -  Sensation intact to light touch, equal bilaterally  Musculoskeletal: -  No clubbing, no cyanosis -  +TTP over lat epicondyle, +pain with resisted R wrist extension -  No edema  Skin: -  No significant lesion on inspection -  Warm and dry to palpation   ASSESSMENT/PLAN: Lateral epicondylitis of right elbow  Community acquired pneumonia of left lower lobe of lung (HCC)  Screen for colon cancer - Plan: Ambulatory referral to Gastroenterology  Witnessed apneic spells - Plan: Ambulatory referral to Pulmonology  Ice, Tylenol, NSAIDs, forearm strap. Tx for abn breath sounds. Supportive care also. Referrals as above. Refused flu shot. Patient should return in 1 mo for CPE. The patient voiced understanding and agreement to the plan.   [name] [place], DO 03/28/17  4:48 PM

## 2017-03-28 NOTE — Progress Notes (Signed)
Pre visit review using our clinic review tool, if applicable. No additional management support is needed unless otherwise documented below in the visit note. 

## 2017-05-03 ENCOUNTER — Encounter: Payer: Self-pay | Admitting: Family Medicine

## 2017-05-04 ENCOUNTER — Encounter: Payer: BLUE CROSS/BLUE SHIELD | Admitting: Family Medicine

## 2017-05-07 ENCOUNTER — Ambulatory Visit (INDEPENDENT_AMBULATORY_CARE_PROVIDER_SITE_OTHER): Payer: BLUE CROSS/BLUE SHIELD | Admitting: Family Medicine

## 2017-05-07 ENCOUNTER — Encounter: Payer: Self-pay | Admitting: Family Medicine

## 2017-05-07 VITALS — BP 128/80 | HR 73 | Temp 98.6°F | Ht 70.0 in | Wt 292.0 lb

## 2017-05-07 DIAGNOSIS — Z1211 Encounter for screening for malignant neoplasm of colon: Secondary | ICD-10-CM

## 2017-05-07 DIAGNOSIS — Z0000 Encounter for general adult medical examination without abnormal findings: Secondary | ICD-10-CM | POA: Diagnosis not present

## 2017-05-07 DIAGNOSIS — Z125 Encounter for screening for malignant neoplasm of prostate: Secondary | ICD-10-CM | POA: Diagnosis not present

## 2017-05-07 NOTE — Progress Notes (Signed)
Pre visit review using our clinic review tool, if applicable. No additional management support is needed unless otherwise documented below in the visit note. 

## 2017-05-07 NOTE — Progress Notes (Signed)
Chief Complaint  Patient presents with  . Annual Exam    Well Male Jorge Weaver is here for a complete physical.   His last physical was >1 year ago.  Current diet: in general, an "unhealthy" diet   Current exercise: none Weight trend: stable  Does pt snore? No. Daytime fatigue? No. Seat belt? Yes.    Health maintenance Colonoscopy- No Tetanus- Yes HIV- Yes 6-7 mo Hep C- Yes 6-7 mo Prostate cancer screening- No   Past Medical History:  Diagnosis Date  . Heart murmur   . Hypertension     Past Surgical History:  Procedure Laterality Date  . NO PAST SURGERIES    . WRIST SURGERY Left 2014   Medications  Current Outpatient Medications on File Prior to Visit  Medication Sig Dispense Refill  . amLODipine (NORVASC) 10 MG tablet Take 10 mg by mouth daily.    . hydrochlorothiazide (HYDRODIURIL) 25 MG tablet Take 25 mg by mouth daily.    Marland Kitchen. lisinopril (PRINIVIL,ZESTRIL) 20 MG tablet Take 20 mg by mouth daily.     Allergies Allergies  Allergen Reactions  . Other     Pt denies any drug allegies   Family History Family History  Problem Relation Age of Onset  . Hypertension Mother     Review of Systems: Constitutional:  no fevers or chills Eye:  no recent significant change in vision Ear/Nose/Mouth/Throat:  Ears:  no hearing loss Nose/Mouth/Throat:  no complaints of nasal congestion or bleeding, no sore throat Cardiovascular:  no chest pain, no palpitations Respiratory:  no cough and no shortness of breath Gastrointestinal:  no abdominal pain, no change in bowel habits, no nausea, vomiting, diarrhea, or constipation and no black or bloody stool GU:  Male: negative for dysuria, frequency, and incontinence and negative for prostate symptoms Musculoskeletal/Extremities:  no pain, redness, or swelling of the joints Integumentary (Skin/Breast):  no abnormal skin lesions reported Neurologic:  no headaches Endocrine: No unexpected weight changes Hematologic/Lymphatic:  no  abnormal bleeding  Exam BP 128/80 (BP Location: Left Arm, Patient Position: Sitting, Cuff Size: Large)   Pulse 73   Temp 98.6 F (37 C) (Oral)   Ht 5\' 10"  (1.778 m)   Wt 292 lb (132.5 kg)   SpO2 97%   BMI 41.90 kg/m  General:  well developed, well nourished, in no apparent distress Skin:  no significant moles, warts, or growths Head:  no masses, lesions, or tenderness Eyes:  pupils equal and round, sclera anicteric without injection Ears:  canals without lesions, TMs shiny without retraction, no obvious effusion, no erythema Nose:  nares patent, septum midline, mucosa normal Throat/Pharynx:  lips and gingiva without lesion; tongue and uvula midline; non-inflamed pharynx; no exudates or postnasal drainage Neck: neck supple without adenopathy, thyromegaly, or masses Lungs:  clear to auscultation, breath sounds equal bilaterally, no respiratory distress Cardio:  regular rate and rhythm without murmurs, heart sounds without clicks or rubs Abdomen:  abdomen soft, nontender; bowel sounds normal; no masses or organomegaly Genital (male): circumcised penis, no lesions or discharge; testes present bilaterally without masses or tenderness Rectal: Deferred Musculoskeletal:  symmetrical muscle groups noted without atrophy or deformity Extremities:  no clubbing, cyanosis, or edema, no deformities, no skin discoloration Neuro:  gait normal; deep tendon reflexes normal and symmetric Psych: well oriented with normal range of affect and appropriate judgment/insight  Assessment and Plan  Well adult exam - Plan: Lipid panel, Comprehensive metabolic panel  Screening for prostate cancer - Plan: PSA  Screen for  colon cancer - Plan: Ambulatory referral to Gastroenterology   Well 52 y.o. male. Counseled on diet and exercise. Pt's mailbox has been full and he has not been receiving calls from GI team. I will refer him again.  Counseled on risks and benefits of prostate cancer screening with PSA. The  patient agrees to undergo testing. Immunizations, labs, and further orders as above. Follow up for fasting labs, 6 mo after that or prn pending results. The patient voiced understanding and agreement to the plan.  [name] [place], DO 05/07/17 3:30 PM

## 2017-05-07 NOTE — Patient Instructions (Addendum)
Aim to do some physical exertion for 150 minutes per week. This is typically divided into 5 days per week, 30 minutes per day. The activity should be enough to get your heart rate up. Anything is better than nothing if you have time constraints.  Clear our your voicemail so the GI team can get a hold of you.   Healthy Eating Plan Many factors influence your heart health, including eating and exercise habits. Heart (coronary) risk increases with abnormal blood fat (lipid) levels. Heart-healthy meal planning includes limiting unhealthy fats, increasing healthy fats, and making other small dietary changes. This includes maintaining a healthy body weight to help keep lipid levels within a normal range.  WHAT IS MY PLAN?  Your health care provider recommends that you:  Drink a glass of water before meals to help with satiety.  Eat slowly.  An alternative to the water is to add Metamucil. This will help with satiety as well. It does contain calories, unlike water.  WHAT TYPES OF FAT SHOULD I CHOOSE?  Choose healthy fats more often. Choose monounsaturated and polyunsaturated fats, such as olive oil and canola oil, flaxseeds, walnuts, almonds, and seeds.  Eat more omega-3 fats. Good choices include salmon, mackerel, sardines, tuna, flaxseed oil, and ground flaxseeds. Aim to eat fish at least two times each week.  Avoid foods with partially hydrogenated oils in them. These contain trans fats. Examples of foods that contain trans fats are stick margarine, some tub margarines, cookies, crackers, and other baked goods. If you are going to avoid a fat, this is the one to avoid!  WHAT GENERAL GUIDELINES DO I NEED TO FOLLOW?  Check food labels carefully to identify foods with trans fats. Avoid these types of options when possible.  Fill one half of your plate with vegetables and green salads. Eat 4-5 servings of vegetables per day. A serving of vegetables equals 1 cup of raw leafy vegetables,  cup of  raw or cooked cut-up vegetables, or  cup of vegetable juice.  Fill one fourth of your plate with whole grains. Look for the word "whole" as the first word in the ingredient list.  Fill one fourth of your plate with lean protein foods.  Eat 4-5 servings of fruit per day. A serving of fruit equals one medium whole fruit,  cup of dried fruit,  cup of fresh, frozen, or canned fruit. Try to avoid fruits in cups/syrups as the sugar content can be high.  Eat more foods that contain soluble fiber. Examples of foods that contain this type of fiber are apples, broccoli, carrots, beans, peas, and barley. Aim to get 20-30 g of fiber per day.  Eat more home-cooked food and less restaurant, buffet, and fast food.  Limit or avoid alcohol.  Limit foods that are high in starch and sugar.  Avoid fried foods when able.  Cook foods by using methods other than frying. Baking, boiling, grilling, and broiling are all great options. Other fat-reducing suggestions include: ? Removing the skin from poultry. ? Removing all visible fats from meats. ? Skimming the fat off of stews, soups, and gravies before serving them. ? Steaming vegetables in water or broth.  Lose weight if you are overweight. Losing just 5-10% of your initial body weight can help your overall health and prevent diseases such as diabetes and heart disease.  Increase your consumption of nuts, legumes, and seeds to 4-5 servings per week. One serving of dried beans or legumes equals  cup after being cooked,  one serving of nuts equals 1 ounces, and one serving of seeds equals  ounce or 1 tablespoon.  WHAT ARE GOOD FOODS CAN I EAT? Grains Grainy breads (try to find bread that is 3 g of fiber per slice or greater), oatmeal, light popcorn. Whole-grain cereals. Rice and pasta, including brown rice and those that are made with whole wheat. Edamame pasta is a great alternative to grain pasta. It has a higher protein content. Try to avoid significant  consumption of white bread, sugary cereals, or pastries/baked goods.  Vegetables All vegetables. Cooked white potatoes do not count as vegetables.  Fruits All fruits, but limit pineapple and bananas as these fruits have a higher sugar content.  Meats and Other Protein Sources Lean, well-trimmed beef, veal, pork, and lamb. Chicken and [place] without skin. All fish and shellfish. Wild duck, rabbit, pheasant, and venison. Egg whites or low-cholesterol egg substitutes. Dried beans, peas, lentils, and tofu.Seeds and most nuts.  Dairy Low-fat or nonfat cheeses, including ricotta, string, and mozzarella. Skim or 1% milk that is liquid, powdered, or evaporated. Buttermilk that is made with low-fat milk. Nonfat or low-fat yogurt. Soy/Almond milk are good alternatives if you cannot handle dairy.  Beverages Water is the best for you. Sports drinks with less sugar are more desirable unless you are a highly active athlete.  Sweets and Desserts Sherbets and fruit ices. Honey, jam, marmalade, jelly, and syrups. Dark chocolate.  Eat all sweets and desserts in moderation.  Fats and Oils Nonhydrogenated (trans-free) margarines. Vegetable oils, including soybean, sesame, sunflower, olive, peanut, safflower, corn, canola, and cottonseed. Salad dressings or mayonnaise that are made with a vegetable oil. Limit added fats and oils that you use for cooking, baking, salads, and as spreads.  Other Cocoa powder. Coffee and tea. Most condiments.  The items listed above may not be a complete list of recommended foods or beverages. Contact your dietitian for more options.

## 2017-05-09 ENCOUNTER — Other Ambulatory Visit (INDEPENDENT_AMBULATORY_CARE_PROVIDER_SITE_OTHER): Payer: BLUE CROSS/BLUE SHIELD

## 2017-05-09 ENCOUNTER — Other Ambulatory Visit: Payer: Self-pay | Admitting: Family Medicine

## 2017-05-09 DIAGNOSIS — Z0000 Encounter for general adult medical examination without abnormal findings: Secondary | ICD-10-CM | POA: Diagnosis not present

## 2017-05-09 DIAGNOSIS — R739 Hyperglycemia, unspecified: Secondary | ICD-10-CM

## 2017-05-09 LAB — LIPID PANEL
CHOL/HDL RATIO: 3
Cholesterol: 147 mg/dL (ref 0–200)
HDL: 57.3 mg/dL (ref >39.00)
LDL Cholesterol: 82 mg/dL (ref 0–99)
NONHDL: 89.66
TRIGLYCERIDES: 36 mg/dL (ref 0.0–149.0)
VLDL: 7.2 mg/dL (ref 0.0–40.0)

## 2017-05-09 LAB — COMPREHENSIVE METABOLIC PANEL
ALT: 14 U/L (ref 0–53)
AST: 16 U/L (ref 0–37)
Albumin: 3.9 g/dL (ref 3.5–5.2)
Alkaline Phosphatase: 64 U/L (ref 39–117)
BILIRUBIN TOTAL: 0.3 mg/dL (ref 0.2–1.2)
BUN: 16 mg/dL (ref 6–23)
CO2: 35 meq/L — AB (ref 19–32)
Calcium: 9.6 mg/dL (ref 8.4–10.5)
Chloride: 98 mEq/L (ref 96–112)
Creatinine, Ser: 1.01 mg/dL (ref 0.40–1.50)
GFR: 99.22 mL/min (ref >60.00)
GLUCOSE: 106 mg/dL — AB (ref 70–99)
POTASSIUM: 4 meq/L (ref 3.5–5.1)
Sodium: 139 mEq/L (ref 135–145)
Total Protein: 7.2 g/dL (ref 6.0–8.3)

## 2017-05-09 LAB — PSA: PSA: 0.69 ng/mL (ref 0.10–4.00)

## 2017-05-10 ENCOUNTER — Other Ambulatory Visit (INDEPENDENT_AMBULATORY_CARE_PROVIDER_SITE_OTHER): Payer: BLUE CROSS/BLUE SHIELD

## 2017-05-10 ENCOUNTER — Other Ambulatory Visit: Payer: Self-pay | Admitting: Family Medicine

## 2017-05-10 DIAGNOSIS — R739 Hyperglycemia, unspecified: Secondary | ICD-10-CM | POA: Diagnosis not present

## 2017-05-10 DIAGNOSIS — R7303 Prediabetes: Secondary | ICD-10-CM

## 2017-05-10 LAB — HEMOGLOBIN A1C: HEMOGLOBIN A1C: 6.3 % (ref 4.6–6.5)

## 2017-06-05 ENCOUNTER — Encounter: Payer: Self-pay | Admitting: Family Medicine

## 2017-07-09 ENCOUNTER — Encounter: Payer: Self-pay | Admitting: Family Medicine

## 2017-08-07 ENCOUNTER — Other Ambulatory Visit (INDEPENDENT_AMBULATORY_CARE_PROVIDER_SITE_OTHER): Payer: BLUE CROSS/BLUE SHIELD

## 2017-08-07 DIAGNOSIS — R7303 Prediabetes: Secondary | ICD-10-CM | POA: Diagnosis not present

## 2017-08-07 LAB — HEMOGLOBIN A1C: Hgb A1c MFr Bld: 6.1 % (ref 4.6–6.5)

## 2017-09-12 ENCOUNTER — Telehealth: Payer: Self-pay | Admitting: *Deleted

## 2017-09-12 ENCOUNTER — Ambulatory Visit: Payer: Self-pay

## 2017-09-12 NOTE — Telephone Encounter (Signed)
Called the patient left message to call back. PCP offer to see him at 11:30//or he can just wait and see how it goes.

## 2017-09-12 NOTE — Telephone Encounter (Signed)
Pt reports "Bug got in my ear yesterday."  States heard the insect "Buzzing." States irrigated ear with hydrogen peroxide; denies, pain, drainage. Does not feel like it's in ear presently. States unsure if he flushed it out, also "Picking at it and using Q-Tip." States no longer hears it. Questioning if needs to be seen;  Please advise: [phone]

## 2017-09-12 NOTE — Telephone Encounter (Signed)
Pt returned call; addressed in telephone encounter.

## 2018-02-25 ENCOUNTER — Ambulatory Visit: Payer: Self-pay

## 2018-02-25 NOTE — Telephone Encounter (Signed)
Phone call from pt. with c/o 3 day hx of headache, body aches, chills, sore throat, earache, chest tightness, and productive cough with "tan-brown phlegm."   Denied nasal drainage.  Reported cough is intermittent, and has had difficulty coughing up the mucus.  Denied shortness or breath, or any resp. Distress, with the c/o chest tightness.  Stated he has not checked temperature, but has chills and body aches.  Reported has been taking Catering managerAlka Seltzer for his symptoms.  Appt. Scheduled with PCP office in AM.  Care advice given per protocol.  Verb. Understanding.  Pt. Advised to call back or go to UC if symptoms worsen.  Verb. Understanding.  Agrees with plan.     Reason for Disposition . Fever present > 3 days (72 hours)  Answer Assessment - Initial Assessment Questions 1. WORST SYMPTOM: "What is your worst symptom?" (e.g., cough, runny nose, muscle aches, headache, sore throat, fever)      "all of it."  C/o Headache, body aches, chills, sore throat, chest tightness, prod. Cough. earache 2. ONSET: "When did your flu symptoms start?"      Friday 3. COUGH: "How bad is the cough?"       Intermittent  4. RESPIRATORY DISTRESS: "Describe your breathing."      Denied  5. FEVER: "Do you have a fever?" If so, ask: "What is your temperature, how was it measured, and when did it start?"     Chills  6. EXPOSURE: "Were you exposed to someone with influenza?"       Not aware   7. FLU VACCINE: "Did you get a flu shot this year?"     No 8.  HIGH RISK DISEASE: "Do you any chronic medical problems?" (e.g., heart or lung disease, asthma, weak immune system, or other HIGH RISK conditions)     Hx of heart murmur; denied any other chronic medical problems.  9. PREGNANCY: "Is there any chance you are pregnant?" "When was your last menstrual period?"     N/a  10. OTHER SYMPTOMS: "Do you have any other symptoms?"  (e.g., runny nose, muscle aches, headache, sore throat)       See # 1; multiple symptoms  Protocols used:  INFLUENZA - SEASONAL-A-AH

## 2018-02-26 ENCOUNTER — Ambulatory Visit: Payer: BLUE CROSS/BLUE SHIELD | Admitting: Medical

## 2018-02-26 ENCOUNTER — Telehealth: Payer: Self-pay | Admitting: Medical

## 2018-02-26 ENCOUNTER — Encounter: Payer: Self-pay | Admitting: Medical

## 2018-02-26 VITALS — BP 127/87 | HR 70 | Temp 98.1°F | Resp 16 | Ht 70.0 in | Wt 283.8 lb

## 2018-02-26 DIAGNOSIS — J029 Acute pharyngitis, unspecified: Secondary | ICD-10-CM

## 2018-02-26 DIAGNOSIS — J40 Bronchitis, not specified as acute or chronic: Secondary | ICD-10-CM

## 2018-02-26 MED ORDER — ALBUTEROL SULFATE HFA 108 (90 BASE) MCG/ACT IN AERS: 2.0000 | INHALATION_SPRAY | Freq: Four times a day (QID) | RESPIRATORY_TRACT | 2 refills | Status: DC | PRN

## 2018-02-26 MED ORDER — DOXYCYCLINE HYCLATE 100 MG PO TABS: 100.0000 mg | ORAL_TABLET | Freq: Two times a day (BID) | ORAL | 0 refills | Status: DC

## 2018-02-26 MED ORDER — BENZONATATE 100 MG PO CAPS: 100.0000 mg | ORAL_CAPSULE | Freq: Three times a day (TID) | ORAL | 0 refills | Status: DC | PRN

## 2018-02-26 NOTE — Patient Instructions (Addendum)
You appear to have bronchitis following viral syndrome like illness. Marland Kitchen. Rest hydrate and tylenol for fever. I am prescribing cough medicine benzonate, and doxycycline antibiotic.  Both flu and rapid strep were negative.  Will rx albuterol inhaler to use if wheezing worsens.   You should gradually get better. If not then notify us and would recommend a chest xray.  Follow up in 7-10 days or as needed

## 2018-02-26 NOTE — Progress Notes (Signed)
Subjective:    Patient ID: Jasper LoserJoe Leising, male    DOB: 1965/08/05, 52 y.o.   MRN: [id number]  HPI   Pt in for sick since Friday.  Pt states on Friday he started to feel tired. He states he had st and diffuse body aches all over. He was fatigued with fever and chills. Monday he felt better. Now residual body aches.   Pt is coughing up mucus and  chest congested now. Occasional wheeze.  Never smoked.   Review of Systems  Constitutional: Positive for chills and fever. Negative for fatigue.  HENT: Positive for sore throat. Negative for congestion, facial swelling, mouth sores, postnasal drip, sinus pressure and sinus pain.   Respiratory: Positive for cough and wheezing. Negative for chest tightness and shortness of breath.        Mild rare wheeze.  Chest congestion.  Cardiovascular: Negative for chest pain and palpitations.  Gastrointestinal: Negative for abdominal pain, constipation, diarrhea and nausea.  Musculoskeletal: Positive for myalgias. Negative for back pain and neck stiffness.  Skin: Negative for rash.  Neurological: Negative for dizziness, syncope, weakness and headaches.  Hematological: Negative for adenopathy. Does not bruise/bleed easily.  Psychiatric/Behavioral: Negative for behavioral problems and confusion. The patient is not nervous/anxious.     Past Medical History:  Diagnosis Date  . Heart murmur   . Hypertension      Social History   Socioeconomic History  . Marital status: Married    Spouse name: Not on file  . Number of children: Not on file  . Years of education: Not on file  . Highest education level: Not on file  Occupational History  . Not on file  Social Needs  . Financial resource strain: Not on file  . Food insecurity:    Worry: Not on file    Inability: Not on file  . Transportation needs:    Medical: Not on file    Non-medical: Not on file  Tobacco Use  . Smoking status: Never Smoker  . Smokeless tobacco: Never Used  Substance  and Sexual Activity  . Alcohol use: Yes    Alcohol/week: 3.0 standard drinks    Types: 3 Cans of beer per week  . Drug use: No  . Sexual activity: Not on file  Lifestyle  . Physical activity:    Days per week: Not on file    Minutes per session: Not on file  . Stress: Not on file  Relationships  . Social connections:    Talks on phone: Not on file    Gets together: Not on file    Attends religious service: Not on file    Active member of club or organization: Not on file    Attends meetings of clubs or organizations: Not on file    Relationship status: Not on file  . Intimate partner violence:    Fear of current or ex partner: Not on file    Emotionally abused: Not on file    Physically abused: Not on file    Forced sexual activity: Not on file  Other Topics Concern  . Not on file  Social History Narrative  . Not on file    Past Surgical History:  Procedure Laterality Date  . NO PAST SURGERIES    . WRIST SURGERY Left 2014    Family History  Problem Relation Age of Onset  . Hypertension Mother     Allergies  Allergen Reactions  . Other     Pt denies any  drug allegies    Current Outpatient Medications on File Prior to Visit  Medication Sig Dispense Refill  . amLODipine (NORVASC) 10 MG tablet Take 10 mg by mouth daily.    . hydrochlorothiazide (HYDRODIURIL) 25 MG tablet Take 25 mg by mouth daily.    Marland Kitchen lisinopril (PRINIVIL,ZESTRIL) 20 MG tablet Take 20 mg by mouth daily.     No current facility-administered medications on file prior to visit.     Pulse 70   Temp 98.1 F (36.7 C) (Oral) Comment (Src): o  Resp 16   Ht 5\' 10"  (1.778 m)   Wt 283 lb 12.8 oz (128.7 kg)   SpO2 97%   BMI 40.72 kg/m       Objective:   Physical Exam  General  Mental Status - Alert. General Appearance - Well groomed. Not in acute distress.  Skin Rashes- No Rashes.  HEENT Head- Normal. Ear Auditory Canal - Left- Normal. Right - Normal.Tympanic Membrane- Left- Normal.  Right- Normal. Eye Sclera/Conjunctiva- Left- Normal. Right- Normal. Nose & Sinuses Nasal Mucosa- Left-  Boggy and Congested. Right-  Boggy and  Congested.Bilateral no maxillary and no frontal sinus pressure. Mouth & Throat Lips: Upper Lip- Normal: no dryness, cracking, pallor, cyanosis, or vesicular eruption. Lower Lip-Normal: no dryness, cracking, pallor, cyanosis or vesicular eruption. Buccal Mucosa- Bilateral- No Aphthous ulcers. Oropharynx- No Discharge or Erythema. Tonsils: Characteristics- Bilateral- moderate Erythema. Size/Enlargement- Bilateral- No enlargement. Discharge- bilateral-None.  Neck Neck- Supple. No Masses. Faint submandibular node enlargement.   Chest and Lung Exam Auscultation: Breath Sounds:-even and unlabored. Faint upper lobe rough breath sounds  Cardiovascular Auscultation:Rythm- Regular, rate and rhythm. Murmurs & Other Heart Sounds:Ausculatation of the heart reveal- No Murmurs.  Lymphatic Head & Neck General Head & Neck Lymphatics: Bilateral: Description- No Localized lymphadenopathy.       Assessment & Plan:  You appear to have bronchitis following viral syndrome like illness. Marland Kitchen Rest hydrate and tylenol for fever. I am prescribing cough medicine benzonate, and doxycycline  Antibiotic.  Both flu and rapid strep were negative.  Will rx albuterol inhaler to use if wheezing worsens.   You should gradually get better. If not then notify us and would recommend a chest xray.  Follow up in 7-10 days or as needed  [employer], [employer]

## 2018-02-26 NOTE — Telephone Encounter (Signed)
Pt bp not on vital section. Will you put that in if was done.

## 2018-05-30 ENCOUNTER — Ambulatory Visit: Payer: BLUE CROSS/BLUE SHIELD | Admitting: Family Medicine

## 2018-05-30 ENCOUNTER — Encounter: Payer: Self-pay | Admitting: Family Medicine

## 2018-05-30 ENCOUNTER — Other Ambulatory Visit: Payer: Self-pay

## 2018-05-30 VITALS — BP 132/90 | HR 42 | Temp 98.5°F | Ht 70.0 in | Wt 285.5 lb

## 2018-05-30 DIAGNOSIS — J029 Acute pharyngitis, unspecified: Secondary | ICD-10-CM | POA: Diagnosis not present

## 2018-05-30 DIAGNOSIS — R030 Elevated blood-pressure reading, without diagnosis of hypertension: Secondary | ICD-10-CM

## 2018-05-30 LAB — POCT RAPID STREP A (OFFICE): Rapid Strep A Screen: NEGATIVE

## 2018-05-30 MED ORDER — METHYLPREDNISOLONE ACETATE 80 MG/ML IJ SUSP
80.0000 mg | Freq: Once | INTRAMUSCULAR | Status: AC
  Administered 2018-05-30: 80 mg via INTRAMUSCULAR

## 2018-05-30 NOTE — Progress Notes (Signed)
Chief Complaint  Patient presents with  . Sore Throat  . Fatigue  . Nasal Congestion    Jorge Weaver here for URI complaints.  Duration: 2 weeks  Associated symptoms: subjective fever, sinus headache, sinus congestion, rhinorrhea, itchy watery eyes, ST, wheezing and cough Denies: ear fullness, ear pain, ear drainage, shortness of breath and myalgia Treatment to date: none Sick contacts: Yes  ROS:  HEENT: As noted in HPI Lungs: No SOB  Past Medical History:  Diagnosis Date  . Heart murmur   . Hypertension     BP 132/90 (BP Location: Left Arm, Patient Position: Sitting, Cuff Size: Large)   Pulse (!) 42   Temp 98.5 F (36.9 C)   Ht 5\' 10"  (1.778 m)   Wt 285 lb 8 oz (129.5 kg)   SpO2 95%   BMI 40.96 kg/m  General: Awake, alert, appears stated age HEENT: AT, [place], ears patent b/l and TM's neg, nares patent w/o discharge, pharynx erythematous though without exudates, MMM Neck: No masses or asymmetry Heart: Bradycardic, reg rhythm Lungs: CTAB, no accessory muscle use Psych: Age appropriate judgment and insight, normal mood and flat affect  Sore throat - Plan: POCT rapid strep A, methylPREDNISolone acetate (DEPO-MEDROL) injection 80 mg  Orders as above. Pred start tomorrow, [name]. Continue to push fluids, practice good hand hygiene, cover mouth when coughing. F/u prn. If starting to experience fevers, shaking, or shortness of breath, seek immediate care. Pt voiced understanding and agreement to the plan.  [name] [place], DO 05/30/18 10:45 AM

## 2018-06-14 ENCOUNTER — Emergency Department (HOSPITAL_BASED_OUTPATIENT_CLINIC_OR_DEPARTMENT_OTHER): Payer: BLUE CROSS/BLUE SHIELD

## 2018-06-14 ENCOUNTER — Encounter (HOSPITAL_BASED_OUTPATIENT_CLINIC_OR_DEPARTMENT_OTHER): Payer: Self-pay

## 2018-06-14 ENCOUNTER — Emergency Department (HOSPITAL_BASED_OUTPATIENT_CLINIC_OR_DEPARTMENT_OTHER)
Admission: EM | Admit: 2018-06-14 | Discharge: 2018-06-14 | Disposition: A | Discharge status: Home / Self Care | Payer: BLUE CROSS/BLUE SHIELD | Attending: Emergency Medicine | Admitting: Emergency Medicine

## 2018-06-14 ENCOUNTER — Other Ambulatory Visit: Payer: Self-pay

## 2018-06-14 DIAGNOSIS — R945 Abnormal results of liver function studies: Secondary | ICD-10-CM | POA: Diagnosis not present

## 2018-06-14 DIAGNOSIS — Z79899 Other long term (current) drug therapy: Secondary | ICD-10-CM | POA: Diagnosis not present

## 2018-06-14 DIAGNOSIS — R5383 Other fatigue: Secondary | ICD-10-CM | POA: Insufficient documentation

## 2018-06-14 DIAGNOSIS — I10 Essential (primary) hypertension: Secondary | ICD-10-CM | POA: Diagnosis not present

## 2018-06-14 DIAGNOSIS — R748 Abnormal levels of other serum enzymes: Secondary | ICD-10-CM | POA: Diagnosis not present

## 2018-06-14 DIAGNOSIS — R05 Cough: Secondary | ICD-10-CM | POA: Diagnosis not present

## 2018-06-14 DIAGNOSIS — R7989 Other specified abnormal findings of blood chemistry: Secondary | ICD-10-CM

## 2018-06-14 LAB — CBC
HCT: 38.1 % — ABNORMAL LOW (ref 39.0–52.0)
Hemoglobin: 12.8 g/dL — ABNORMAL LOW (ref 13.0–17.0)
MCH: 30.5 pg (ref 26.0–34.0)
MCHC: 33.6 g/dL (ref 30.0–36.0)
MCV: 90.7 fL (ref 80.0–100.0)
Platelets: 178 10*3/uL (ref 150–400)
RBC: 4.2 MIL/uL — ABNORMAL LOW (ref 4.22–5.81)
RDW: 15.1 % (ref 11.5–15.5)
WBC: 3.3 10*3/uL — ABNORMAL LOW (ref 4.0–10.5)
nRBC: 0 % (ref 0.0–0.2)

## 2018-06-14 LAB — URINALYSIS, ROUTINE W REFLEX MICROSCOPIC
BILIRUBIN URINE: NEGATIVE
Glucose, UA: NEGATIVE mg/dL
Ketones, ur: NEGATIVE mg/dL
Leukocytes,Ua: NEGATIVE
NITRITE: NEGATIVE
Protein, ur: NEGATIVE mg/dL
Specific Gravity, Urine: 1.01 (ref 1.005–1.030)
pH: 6 (ref 5.0–8.0)

## 2018-06-14 LAB — COMPREHENSIVE METABOLIC PANEL
ALT: 420 U/L — ABNORMAL HIGH (ref 0–44)
AST: 758 U/L — ABNORMAL HIGH (ref 15–41)
Albumin: 3.1 g/dL — ABNORMAL LOW (ref 3.5–5.0)
Alkaline Phosphatase: 117 U/L (ref 38–126)
Anion gap: 8 (ref 5–15)
BUN: 11 mg/dL (ref 6–20)
CO2: 28 mmol/L (ref 22–32)
CREATININE: 1 mg/dL (ref 0.61–1.24)
Calcium: 8 mg/dL — ABNORMAL LOW (ref 8.9–10.3)
Chloride: 101 mmol/L (ref 98–111)
GFR calc Af Amer: >60 mL/min (ref >60)
GFR calc non Af Amer: >60 mL/min (ref >60)
Glucose, Bld: 140 mg/dL — ABNORMAL HIGH (ref 70–99)
Potassium: 3.2 mmol/L — ABNORMAL LOW (ref 3.5–5.1)
Sodium: 137 mmol/L (ref 135–145)
Total Bilirubin: 3.1 mg/dL — ABNORMAL HIGH (ref 0.3–1.2)
Total Protein: 6.8 g/dL (ref 6.5–8.1)

## 2018-06-14 LAB — LIPASE, BLOOD: Lipase: 29 U/L (ref 11–51)

## 2018-06-14 LAB — URINALYSIS, MICROSCOPIC (REFLEX)

## 2018-06-14 NOTE — ED Triage Notes (Signed)
Pt is very vague about symptoms, states he doesn't feel good and has not felt good for about a month. He denies fevers, denies known exposure to COVID, is not taking anything to help his symptoms and is a very passive participant in triage

## 2018-06-14 NOTE — ED Provider Notes (Signed)
[facility] EMERGENCY DEPARTMENT Provider Note   CSN: [id number] Arrival date & time: 06/14/18  1707    History   Chief Complaint Chief Complaint  Patient presents with   Fatigue    HPI Jorge Weaver is a 53 y.o. male.     HPI Patient is a 53 year old male who reports for the last 4 weeks he is generally not felt well.  He cannot describe this much further.  He states generalized fatigue and decreased energy.  He denies nausea vomiting.  Denies chest pain and abdominal pain.  Denies urinary symptoms.  No fevers.  No rash.  No weakness of his arms or legs.  He cannot describe this symptoms any further than "I just do not feel good".  No fevers or recent travel.  No recent interaction with COVID-19 known persons or persons under investigation   Past Medical History:  Diagnosis Date   Heart murmur    Hypertension     Patient Active Problem List   Diagnosis Date Noted   Witnessed apneic spells 03/28/2017    Past Surgical History:  Procedure Laterality Date   NO PAST SURGERIES     WRIST SURGERY Left 2014        Home Medications    Prior to Admission medications   Medication Sig Start Date End Date Taking? Authorizing Provider  albuterol (PROVENTIL HFA;VENTOLIN HFA) 108 (90 Base) MCG/ACT inhaler Inhale 2 puffs into the lungs every 6 (six) hours as needed for wheezing or shortness of breath. 02/26/18   Saguier, Ramon Dredge, PA-C  amLODipine (NORVASC) 10 MG tablet Take 10 mg by mouth daily.    [provider]  benzonatate (TESSALON) 100 MG capsule Take 1 capsule (100 mg total) by mouth 3 (three) times daily as needed for cough. 02/26/18   Saguier, Ramon Dredge, PA-C  doxycycline (VIBRA-TABS) 100 MG tablet Take 1 tablet (100 mg total) by mouth 2 (two) times daily. Can give caps or generic 02/26/18   Saguier, Ramon Dredge, PA-C  hydrochlorothiazide (HYDRODIURIL) 25 MG tablet Take 25 mg by mouth daily.    [provider]  lisinopril (PRINIVIL,ZESTRIL) 20 MG  tablet Take 20 mg by mouth daily.    [provider]    Family History Family History  Problem Relation Age of Onset   Hypertension Mother     Social History Social History   Tobacco Use   Smoking status: Never Smoker   Smokeless tobacco: Never Used  Substance Use Topics   Alcohol use: Yes    Alcohol/week: 3.0 standard drinks    Types: 3 Cans of beer per week   Drug use: No     Allergies   Patient has no known allergies.   Review of Systems Review of Systems  All other systems reviewed and are negative.    Physical Exam Updated Vital Signs BP (!) 129/96 (BP Location: Left Arm)    Pulse 94    Temp 98 F (36.7 C) (Oral)    Resp 20    Ht 5\' 10"  (1.778 m)    Wt 129.5 kg    SpO2 94%    BMI 40.96 kg/m   Physical Exam Vitals signs and nursing note reviewed.  Constitutional:      Appearance: He is well-developed.  HENT:     Head: Normocephalic and atraumatic.  Neck:     Musculoskeletal: Normal range of motion.  Cardiovascular:     Rate and Rhythm: Normal rate and regular rhythm.     Heart sounds: Normal  heart sounds.  Pulmonary:     Effort: Pulmonary effort is normal. No respiratory distress.     Breath sounds: Normal breath sounds.  Abdominal:     General: There is no distension.     Palpations: Abdomen is soft.     Tenderness: There is no abdominal tenderness.  Musculoskeletal: Normal range of motion.  Skin:    General: Skin is warm and dry.  Neurological:     Mental Status: He is alert and oriented to person, place, and time.  Psychiatric:        Judgment: Judgment normal.      ED Treatments / Results  Labs (all labs ordered are listed, but only abnormal results are displayed) Labs Reviewed  CBC - Abnormal; Notable for the following components:      Result Value   WBC 3.3 (*)    RBC 4.20 (*)    Hemoglobin 12.8 (*)    HCT 38.1 (*)    All other components within normal limits  COMPREHENSIVE METABOLIC PANEL - Abnormal; Notable for  the following components:   Potassium 3.2 (*)    Glucose, Bld 140 (*)    Calcium 8.0 (*)    Albumin 3.1 (*)    AST 758 (*)    ALT 420 (*)    Total Bilirubin 3.1 (*)    All other components within normal limits  URINALYSIS, ROUTINE W REFLEX MICROSCOPIC - Abnormal; Notable for the following components:   Hgb urine dipstick TRACE (*)    All other components within normal limits  URINALYSIS, MICROSCOPIC (REFLEX) - Abnormal; Notable for the following components:   Bacteria, UA RARE (*)    All other components within normal limits  LIPASE, BLOOD  HEPATITIS PANEL, ACUTE    EKG None  Radiology Dg Chest 2 View  Result Date: 06/14/2018 CLINICAL DATA:  Cough generalized weakness. Headache and body aches. Symptoms for 4 weeks. EXAM: CHEST - 2 VIEW COMPARISON:  10/15/2014 FINDINGS: Low lung volumes with bronchovascular crowding.The cardiomediastinal contours are normal. Minor bibasilar atelectasis. Pulmonary vasculature is normal. No confluent consolidation, pleural effusion, or pneumothorax. No acute osseous abnormalities are seen. IMPRESSION: Low lung volumes with mild bibasilar atelectasis. Electronically Signed   By: Narda Rutherford M.D.   On: 06/14/2018 19:00   US Abdomen Limited Ruq  Result Date: 06/14/2018 CLINICAL DATA:  Elevated LFTs. EXAM: ULTRASOUND ABDOMEN LIMITED RIGHT UPPER QUADRANT COMPARISON:  CT 10/15/2013 FINDINGS: Gallbladder: Physiologically distended. No gallstones or wall thickening visualized. No sonographic Murphy sign noted by sonographer. Common bile duct: Diameter: 3 mm, but not well visualized.  No biliary dilatation. Liver: No focal lesion identified. Diffusely increased and heterogeneous in parenchymal echogenicity. Portal vein is patent on color Doppler imaging with normal direction of blood flow towards the liver. IMPRESSION: 1. Hepatic steatosis.  No focal hepatic abnormality. 2. Unremarkable sonographic appearance of the gallbladder. No biliary dilatation.  Electronically Signed   By: Narda Rutherford M.D.   On: 06/14/2018 20:37    Procedures Procedures (including critical care time)  Medications Ordered in ED Medications - No data to display   Initial Impression / Assessment and Plan / ED Course  I have reviewed the triage vital signs and the nursing notes.  Pertinent labs & imaging results that were available during my care of the patient were reviewed by me and considered in my medical decision making (see chart for details).       Likely with some type of hepatitis.  Elevated liver function test.  Ultrasound is reassuring.  No other significant symptoms.  Vital signs are stable.  Acute hepatitis panel ordered.  He will need close outpatient follow-up with his primary care physician and with a gastroenterologist.  No indication for additional management or acute treatment here in the emergency department tonight.  He does not need admission to the hospital.  He understands the importance of close GI follow-up and repeat liver function tests and the results of his acute hepatitis panel   Final Clinical Impressions(s) / ED Diagnoses   Final diagnoses:  Elevated liver enzymes  Elevated liver function tests  Fatigue, unspecified type    ED Discharge Orders    None       Azalia Bilis, MD 06/14/18 2156

## 2018-06-14 NOTE — Discharge Instructions (Addendum)
Please call and follow up with the gastroenterologist regarding your abnormal liver function tests  Follow-up and additional testing is required

## 2018-06-14 NOTE — ED Notes (Signed)
Patient transported to Ultrasound 

## 2018-06-16 LAB — HEPATITIS PANEL, ACUTE
HCV Ab: <0.1 s/co ratio (ref 0.0–0.9)
Hep A IgM: NEGATIVE
Hep B C IgM: NEGATIVE
Hepatitis B Surface Ag: NEGATIVE

## 2018-06-17 ENCOUNTER — Encounter: Payer: Self-pay | Admitting: Family Medicine

## 2018-06-17 ENCOUNTER — Ambulatory Visit: Payer: BLUE CROSS/BLUE SHIELD | Admitting: Family Medicine

## 2018-06-17 ENCOUNTER — Other Ambulatory Visit: Payer: Self-pay

## 2018-06-17 VITALS — BP 138/90 | HR 91 | Temp 98.7°F | Ht 70.0 in | Wt 278.0 lb

## 2018-06-17 DIAGNOSIS — E876 Hypokalemia: Secondary | ICD-10-CM | POA: Diagnosis not present

## 2018-06-17 DIAGNOSIS — Z800 Family history of malignant neoplasm of digestive organs: Secondary | ICD-10-CM

## 2018-06-17 DIAGNOSIS — D649 Anemia, unspecified: Secondary | ICD-10-CM

## 2018-06-17 DIAGNOSIS — R5383 Other fatigue: Secondary | ICD-10-CM | POA: Diagnosis not present

## 2018-06-17 DIAGNOSIS — Z114 Encounter for screening for human immunodeficiency virus [HIV]: Secondary | ICD-10-CM | POA: Diagnosis not present

## 2018-06-17 DIAGNOSIS — I10 Essential (primary) hypertension: Secondary | ICD-10-CM

## 2018-06-17 DIAGNOSIS — K760 Fatty (change of) liver, not elsewhere classified: Secondary | ICD-10-CM

## 2018-06-17 LAB — COMPREHENSIVE METABOLIC PANEL
ALBUMIN: 3.3 g/dL — AB (ref 3.5–5.2)
ALT: 324 U/L — ABNORMAL HIGH (ref 0–53)
AST: 363 U/L — ABNORMAL HIGH (ref 0–37)
Alkaline Phosphatase: 147 U/L — ABNORMAL HIGH (ref 39–117)
BUN: 12 mg/dL (ref 6–23)
CO2: 31 mEq/L (ref 19–32)
Calcium: 8.5 mg/dL (ref 8.4–10.5)
Chloride: 95 mEq/L — ABNORMAL LOW (ref 96–112)
Creatinine, Ser: 1.17 mg/dL (ref 0.40–1.50)
GFR: 78.45 mL/min (ref >60.00)
Glucose, Bld: 118 mg/dL — ABNORMAL HIGH (ref 70–99)
Potassium: 3.4 mEq/L — ABNORMAL LOW (ref 3.5–5.1)
SODIUM: 137 meq/L (ref 135–145)
Total Bilirubin: 2.3 mg/dL — ABNORMAL HIGH (ref 0.2–1.2)
Total Protein: 7.1 g/dL (ref 6.0–8.3)

## 2018-06-17 LAB — CBC
HCT: 40.2 % (ref 39.0–52.0)
Hemoglobin: 13.9 g/dL (ref 13.0–17.0)
MCHC: 34.4 g/dL (ref 30.0–36.0)
MCV: 91.1 fl (ref 78.0–100.0)
Platelets: 183 10*3/uL (ref 150.0–400.0)
RBC: 4.42 Mil/uL (ref 4.22–5.81)
RDW: 16.5 % — ABNORMAL HIGH (ref 11.5–15.5)
WBC: 3.6 10*3/uL — ABNORMAL LOW (ref 4.0–10.5)

## 2018-06-17 MED ORDER — LOSARTAN POTASSIUM-HCTZ 100-25 MG PO TABS: 1.0000 | ORAL_TABLET | Freq: Every day | ORAL | 3 refills | Status: DC

## 2018-06-17 MED ORDER — AMLODIPINE BESYLATE 10 MG PO TABS: 10.0000 mg | ORAL_TABLET | Freq: Every day | ORAL | 3 refills | Status: DC

## 2018-06-17 NOTE — Patient Instructions (Signed)
If you do not hear anything about your referral in the next 1-2 weeks, call our office and ask for an update.  Give [place] 2-3 business days to get the results of your labs back.   Keep the diet clean and stay active.  Stay hydrated- 64-80 oz is a good goal to shoot for. Consider something with electrolytes (Gatorade/Powerade) to help with cramping.   Take your blood pressure medication daily.  Let us know if you need anything.

## 2018-06-17 NOTE — Progress Notes (Signed)
Chief Complaint  Patient presents with  . Hospitalization Follow-up    fatigue    Subjective: Patient is a 53 y.o. male here for f/u fatigue.  Around 1 mo, has been having low energy. Assoc intermittent sob, back pain and cramping. Admits to drinking around 32 oz daily of water. Not physically active. Went to ED, CXR showed b/l atelectasis in addition to having anemia on CBC. He has not responded to outreach to setting up colonoscopy, but is very interested in this today. His sister was dx'd w colon cancer at age 19. He denies any unintentional wt loss, fevers, ST or other URI s/s's, recent travel, rashes, bleeding, stool changes or urinary complaints.   ROS: Const: No fevers Endo: No wt loss  Past Medical History:  Diagnosis Date  . Fatty liver   . Heart murmur   . Hypertension     Objective: BP 138/90 (BP Location: Left Arm, Patient Position: Sitting, Cuff Size: Large)   Pulse 91   Temp 98.7 F (37.1 C) (Oral)   Ht 5\' 10"  (1.778 m)   Wt 278 lb (126.1 kg)   SpO2 94%   BMI 39.89 kg/m  General: Awake, appears stated age HEENT: MMM, EOMi, ears neg, nares patent w/o dc, no pharyngeal exudate/erythema Heart: RRR, no LE edema Abd: S, mild distension, NT, no masses or organomegal Lungs: CTAB, no rales, wheezes or rhonchi. No accessory muscle use Psych: Age appropriate judgment and insight, normal affect and mood  Assessment and Plan: Family history of colon cancer - Plan: Ambulatory referral to Gastroenterology  Fatty liver - Plan: Comprehensive metabolic panel  Normocytic anemia - Plan: CBC, Ambulatory referral to Gastroenterology  Fatigue, unspecified type - Plan: TSH, T4, free, Vitamin D (25 hydroxy)  Hypokalemia - Plan: Comprehensive metabolic panel  Screening for HIV (human immunodeficiency virus) - Plan: HIV Antibody (routine testing w rflx)  Needs to see GI. Referral placed yet again. Reck labs. US showed fatty liver.  Needs to stay hydrated. 64-80 oz daily  rec'd.  Counseled on diet and exercise. I think his sob is related to deconditioning. He needs to move that will force him to take deep breaths and thus open up his lungs more.  Take BP meds daily.  Fu pending above.  The patient voiced understanding and agreement to the plan.  [name] [place], DO 06/17/18  1:55 PM

## 2018-06-18 ENCOUNTER — Other Ambulatory Visit: Payer: Self-pay | Admitting: Family Medicine

## 2018-06-18 LAB — VITAMIN D 25 HYDROXY (VIT D DEFICIENCY, FRACTURES): VITD: 12.85 ng/mL — ABNORMAL LOW (ref 30.00–100.00)

## 2018-06-18 LAB — HIV ANTIBODY (ROUTINE TESTING W REFLEX): HIV 1&2 Ab, 4th Generation: NONREACTIVE

## 2018-06-18 LAB — T4, FREE: Free T4: 1.04 ng/dL (ref 0.60–1.60)

## 2018-06-18 LAB — TSH: TSH: 0.56 u[IU]/mL (ref 0.35–4.50)

## 2018-06-18 MED ORDER — VITAMIN D (ERGOCALCIFEROL) 1.25 MG (50000 UNIT) PO CAPS: 50000.0000 [IU] | ORAL_CAPSULE | ORAL | 0 refills | Status: DC

## 2018-06-19 ENCOUNTER — Other Ambulatory Visit: Payer: Self-pay | Admitting: Family Medicine

## 2018-06-19 DIAGNOSIS — E559 Vitamin D deficiency, unspecified: Secondary | ICD-10-CM

## 2018-06-21 ENCOUNTER — Telehealth: Payer: Self-pay

## 2018-06-21 NOTE — Telephone Encounter (Signed)
Assuming she is on HIPPA form for him: Jorge Weaver needs to increase his fluid intake and activity levels. His vitamin D levels are very low. Additionally, his blood count was slightly low in ED, thus, we need to have him see a GI specialist for colonoscopy as this can reveal GI bleeding. We have tried to set this up in past, and are trying again. Ty.

## 2018-06-21 NOTE — Telephone Encounter (Signed)
Copied from CRM [id number]. Topic: General - Inquiry >> Jun 21, 2018 10:58 AM Terisa Starr wrote: Reason for CRM: patient's wife, [name] is calling to see if she can get some information from Dr [name] about his test that he done at the ER. She said she was unable to go due to the restrictions. She said it is like pulling teeth to get her husband to make sense of what is wrong with him and she is trying to make sure he is doing what he needs to do.

## 2018-06-21 NOTE — Telephone Encounter (Signed)
CALLED THE patient/wife left message to call back.

## 2018-06-25 NOTE — Telephone Encounter (Signed)
Called unable to get on the phone 

## 2018-06-26 ENCOUNTER — Encounter: Payer: Self-pay | Admitting: Family Medicine

## 2018-06-26 ENCOUNTER — Telehealth: Payer: Self-pay

## 2018-06-26 ENCOUNTER — Ambulatory Visit (INDEPENDENT_AMBULATORY_CARE_PROVIDER_SITE_OTHER): Payer: BLUE CROSS/BLUE SHIELD | Admitting: Family Medicine

## 2018-06-26 ENCOUNTER — Other Ambulatory Visit: Payer: Self-pay

## 2018-06-26 ENCOUNTER — Telehealth: Payer: Self-pay | Admitting: *Deleted

## 2018-06-26 ENCOUNTER — Ambulatory Visit: Payer: Self-pay | Admitting: Family Medicine

## 2018-06-26 DIAGNOSIS — K219 Gastro-esophageal reflux disease without esophagitis: Secondary | ICD-10-CM | POA: Diagnosis not present

## 2018-06-26 DIAGNOSIS — I10 Essential (primary) hypertension: Secondary | ICD-10-CM | POA: Diagnosis not present

## 2018-06-26 LAB — BASIC METABOLIC PANEL
BUN: 33 mg/dL — ABNORMAL HIGH (ref 6–23)
CO2: 31 mEq/L (ref 19–32)
Calcium: 9.1 mg/dL (ref 8.4–10.5)
Chloride: 83 mEq/L — ABNORMAL LOW (ref 96–112)
Creatinine, Ser: 3.33 mg/dL — ABNORMAL HIGH (ref 0.40–1.50)
GFR: 23.46 mL/min — ABNORMAL LOW (ref >60.00)
Glucose, Bld: 127 mg/dL — ABNORMAL HIGH (ref 70–99)
Potassium: 3 mEq/L — ABNORMAL LOW (ref 3.5–5.1)
Sodium: 128 mEq/L — ABNORMAL LOW (ref 135–145)

## 2018-06-26 MED ORDER — PANTOPRAZOLE SODIUM 40 MG PO TBEC: 40.0000 mg | DELAYED_RELEASE_TABLET | Freq: Every day | ORAL | 3 refills | Status: DC

## 2018-06-26 NOTE — Telephone Encounter (Signed)
Letter was faxed by Silas Flood. And faxed to correct number

## 2018-06-26 NOTE — Telephone Encounter (Signed)
Work note faxed to employer at [phone] with confirmation/SLS 04/08

## 2018-06-26 NOTE — Telephone Encounter (Addendum)
Received call from [facility] center, pt's wife had called about patient [spouse] having lightheadedness and vomiting. Spoke with wife and she conferenced in patient on a 3-way call.  Message relayed to Dr, [name] via Carollee Herter: Dr. [name], spoke with Jorge Weaver and his wife MRN [id number], he has been having lightheadedness, nausea & vomiting since Fri or Sat. He reports his dizziness is from changing positions, he reports pain in left shoulder and lower back, he reports sweats & chills, no diarrhea [he has been drinking water]. He reports starting new BP medication on 06/17/18 Losartan-HCTZ and feels like it is coming from medication. He reports has already taken today. No fever reported. His wife is going out to his work and is going to take orthostatic BP readings and call me back. PLease Advise & Thanks !   Per Carollee Herter, Dr. [name] wants a visual visit with patient today after we receive BP readings from wife. Awaiting call back/SLS 04/08  Wife returned call, pt is mild Orhtostatic: Sit 101/62 and Stand 92/59, pt placed on schedule for 12pm/SLS 04/08

## 2018-06-26 NOTE — Progress Notes (Signed)
Virtual Visit via Video Note  I connected with Jorge Weaver on 06/26/18 at 11:15 AM EDT by a video enabled telemedicine application and verified that I am speaking with the correct person using two identifiers.   I discussed the limitations of evaluation and management by telemedicine and the availability of in person appointments. The patient expressed understanding and agreed to proceed.  History of Present Illness: Over past couple days, has been light headed, nauseated, and w poor energy/appetite. BP's have been low. Currently on Hyzaar 100-25 mg/d and Norvasc 10 mg/d, reports compliance. Diet is fair, not active. No CP or SOB. BP today was 102/58 and it decreased when he stood up.  Hx of reflux. Not taking anything currently. Will also regurgitate, which for him is making it difficult to keep down fluids.    Observations/Objective: No conversational dyspnea Age appropriate judgment and insight Nml affect and mood  Assessment and Plan: Essential hypertension - Plan: Basic metabolic panel  Gastroesophageal reflux disease, esophagitis presence not specified - Plan: pantoprazole (PROTONIX) 40 MG tablet  Orders as above. Cont Hyzaar, take away Norvasc. Ck lytes. Counseled on diet and exercise. Monitor BP. Add Protonix. Elevate HOB, monitor food triggers. Needs to contact GI.   Follow Up Instructions: 1 week   I discussed the assessment and treatment plan with the patient. The patient was provided an opportunity to ask questions and all were answered. The patient agreed with the plan and demonstrated an understanding of the instructions.   The patient was advised to call back or seek an in-person evaluation if the symptoms worsen or if the condition fails to improve as anticipated.   [name] [place], DO

## 2018-06-26 NOTE — Telephone Encounter (Signed)
I returned wife's call.  Her husband (pt) is at work.   She had called yesterday.   I let her know Robin from Dr. Hollie Beach office had tried to call them back yesterday.  She replied,  "They must have called his phone and he didn't answer".   I called the [place] office to connect her with Robin Ewing.  Zella Ball was out of the office so they connected me with someone else.   When I attempted to conference the wife into the office she got disconnected.   I got a Skype message from the agent saying she had Mrs. Sterling on her line.   I instructed her to put her through to the office because they had been trying to get a hold of her. I let the person I was talking to know and she said she would go up front to let the front desk know she needed to talk with the wife.     Answer Assessment - Initial Assessment Questions 1. SYMPTOMS: "Do you have any symptoms?"     Wife concerned.    Pt is lightheaded.  He is at work now.  It's hard to find out what is going on with him from him.   2. SEVERITY: If symptoms are present, ask "Are they mild, moderate or severe?"     He vomited.  Protocols used: MEDICATION QUESTION CALL-A-AH

## 2018-06-26 NOTE — Telephone Encounter (Signed)
Copied from CRM [id number]. Topic: General - Other >> Jun 26, 2018 12:28 PM Lorrine Kin, NT wrote: Reason for CRM: Patient's wife calling and states that the patient was just on a virtual visit with Dr [name] and had given the wrong fax number for his work. States that a letter is supposed to be sent to employer.  Updated fax#: [phone]

## 2018-06-26 NOTE — Addendum Note (Signed)
Addended by: Mervin Kung A on: 06/26/2018 01:18 PM   Modules accepted: Orders

## 2018-06-27 ENCOUNTER — Other Ambulatory Visit: Payer: Self-pay | Admitting: Family Medicine

## 2018-06-27 DIAGNOSIS — E876 Hypokalemia: Secondary | ICD-10-CM

## 2018-06-27 DIAGNOSIS — N179 Acute kidney failure, unspecified: Secondary | ICD-10-CM

## 2018-06-27 MED ORDER — POTASSIUM CHLORIDE ER 10 MEQ PO TBCR: 20.0000 meq | EXTENDED_RELEASE_TABLET | Freq: Every day | ORAL | 0 refills | Status: DC

## 2018-06-27 MED ORDER — METOPROLOL SUCCINATE ER 50 MG PO TB24: 50.0000 mg | ORAL_TABLET | Freq: Every day | ORAL | 3 refills | Status: DC

## 2018-06-27 NOTE — Progress Notes (Signed)
Stop ARB/HCTZ. Go back on Norvasc, add BB. Temp K supp, reck on Mon.

## 2018-07-01 ENCOUNTER — Other Ambulatory Visit (INDEPENDENT_AMBULATORY_CARE_PROVIDER_SITE_OTHER): Payer: BLUE CROSS/BLUE SHIELD

## 2018-07-01 ENCOUNTER — Other Ambulatory Visit: Payer: Self-pay

## 2018-07-01 DIAGNOSIS — E876 Hypokalemia: Secondary | ICD-10-CM | POA: Diagnosis not present

## 2018-07-01 LAB — BASIC METABOLIC PANEL
BUN: 17 mg/dL (ref 6–23)
CO2: 28 mEq/L (ref 19–32)
Calcium: 8.9 mg/dL (ref 8.4–10.5)
Chloride: 98 mEq/L (ref 96–112)
Creatinine, Ser: 1.28 mg/dL (ref 0.40–1.50)
GFR: 70.72 mL/min (ref >60.00)
Glucose, Bld: 95 mg/dL (ref 70–99)
Potassium: 3.4 mEq/L — ABNORMAL LOW (ref 3.5–5.1)
Sodium: 135 mEq/L (ref 135–145)

## 2018-07-02 ENCOUNTER — Other Ambulatory Visit: Payer: Self-pay | Admitting: Family Medicine

## 2018-07-02 DIAGNOSIS — E876 Hypokalemia: Secondary | ICD-10-CM

## 2018-07-02 MED ORDER — POTASSIUM CHLORIDE ER 10 MEQ PO TBCR: 20.0000 meq | EXTENDED_RELEASE_TABLET | Freq: Every day | ORAL | 0 refills | Status: DC

## 2018-07-02 NOTE — Progress Notes (Signed)
Reck and also ck mag.

## 2018-07-05 ENCOUNTER — Other Ambulatory Visit: Payer: Self-pay

## 2018-07-05 ENCOUNTER — Other Ambulatory Visit (INDEPENDENT_AMBULATORY_CARE_PROVIDER_SITE_OTHER): Payer: BLUE CROSS/BLUE SHIELD

## 2018-07-05 ENCOUNTER — Other Ambulatory Visit: Payer: Self-pay | Admitting: Family Medicine

## 2018-07-05 DIAGNOSIS — E876 Hypokalemia: Secondary | ICD-10-CM | POA: Diagnosis not present

## 2018-07-05 LAB — BASIC METABOLIC PANEL
BUN: 12 mg/dL (ref 6–23)
CO2: 27 mEq/L (ref 19–32)
Calcium: 8.7 mg/dL (ref 8.4–10.5)
Chloride: 104 mEq/L (ref 96–112)
Creatinine, Ser: 1.05 mg/dL (ref 0.40–1.50)
GFR: 88.87 mL/min (ref >60.00)
Glucose, Bld: 89 mg/dL (ref 70–99)
Potassium: 4.3 mEq/L (ref 3.5–5.1)
Sodium: 138 mEq/L (ref 135–145)

## 2018-07-05 LAB — MAGNESIUM: Magnesium: 1.6 mg/dL (ref 1.5–2.5)

## 2018-08-02 ENCOUNTER — Other Ambulatory Visit: Payer: BLUE CROSS/BLUE SHIELD

## 2018-08-30 ENCOUNTER — Encounter: Payer: Self-pay | Admitting: Family Medicine

## 2018-08-30 ENCOUNTER — Ambulatory Visit: Payer: BC Managed Care – PPO | Admitting: Family Medicine

## 2018-08-30 ENCOUNTER — Other Ambulatory Visit: Payer: Self-pay

## 2018-08-30 VITALS — BP 114/76 | HR 80 | Temp 98.9°F | Ht 70.0 in | Wt 270.0 lb

## 2018-08-30 DIAGNOSIS — R0681 Apnea, not elsewhere classified: Secondary | ICD-10-CM | POA: Diagnosis not present

## 2018-08-30 DIAGNOSIS — L219 Seborrheic dermatitis, unspecified: Secondary | ICD-10-CM | POA: Diagnosis not present

## 2018-08-30 MED ORDER — KETOCONAZOLE 2 % EX CREA: 1.0000 "application " | TOPICAL_CREAM | Freq: Every day | CUTANEOUS | 0 refills | Status: DC

## 2018-08-30 NOTE — Progress Notes (Signed)
Chief Complaint  Patient presents with  . Rash    face    Jorge Weaver is a 53 y.o. male here for a skin complaint.  Duration: 1 week Location: face Pruritic? No Painful? No Drainage? No New soaps/lotions/topicals/detergents? No Sick contacts? No Other associated symptoms: scaling Therapies tried thus far: facial lotion  Continued fatigue. Does snore, +witnessed apneic episodes. He was referred to pulm 1.5 yrs ago but never went. He is motivated now. No exercise, diet is poor.   ROS:  Const: No fevers Skin: As noted in HPI  Past Medical History:  Diagnosis Date  . Fatty liver   . Heart murmur   . Hypertension     BP 114/76 (BP Location: Left Arm, Patient Position: Sitting, Cuff Size: Large)   Pulse 80   Temp 98.9 F (37.2 C) (Oral)   Ht 5\' 10"  (1.778 m)   Wt 270 lb (122.5 kg)   SpO2 93%   BMI 38.74 kg/m  Gen: awake, alert, appearing stated age Lungs: No accessory muscle use Skin: See below. No drainage, erythema, TTP, fluctuance, excoriation Psych: Age appropriate judgment and insight        Seborrheic dermatitis - Plan: ketoconazole (NIZORAL) 2 % cream, Trial cream, if no better will trial 1% HC cream.   Witnessed apneic spells - Plan: Ambulatory referral to Pulmonology, likely has OSA.   Orders as above. F/u prn. The patient voiced understanding and agreement to the plan.  Wal[place], DO 08/30/18 1:18 PM

## 2018-08-30 NOTE — Patient Instructions (Signed)
Try to keep the area clean and dry.  Let us know if you need anything.

## 2018-09-12 ENCOUNTER — Telehealth: Payer: Self-pay | Admitting: Family Medicine

## 2018-09-12 DIAGNOSIS — K219 Gastro-esophageal reflux disease without esophagitis: Secondary | ICD-10-CM

## 2018-09-12 DIAGNOSIS — L219 Seborrheic dermatitis, unspecified: Secondary | ICD-10-CM

## 2018-09-12 NOTE — Telephone Encounter (Signed)
REFILL amLODipine (NORVASC) 10 MG tablet  ketoconazole (NIZORAL) 2 % cream metoprolol succinate (TOPROL-XL) 50 MG 24 hr  pantoprazole (PROTONIX) 40 MG tablet potassium chloride (KLOR-CON 10) 10 MEQ tablet  Vitamin D, Ergocalciferol, (DRISDOL) 1.25 MG (50000 UT) CAPS capsule  PH[facility]Y [id number] - [place], [place] - [address] AT [address] [phone] (Phone) [phone] (Fax)   Significant History/Details

## 2018-09-13 MED ORDER — METOPROLOL SUCCINATE ER 50 MG PO TB24: 50.0000 mg | ORAL_TABLET | Freq: Every day | ORAL | 3 refills | Status: DC

## 2018-09-13 MED ORDER — KETOCONAZOLE 2 % EX CREA: 1.0000 "application " | TOPICAL_CREAM | Freq: Every day | CUTANEOUS | 0 refills | Status: DC

## 2018-09-13 MED ORDER — PANTOPRAZOLE SODIUM 40 MG PO TBEC: 40.0000 mg | DELAYED_RELEASE_TABLET | Freq: Every day | ORAL | 3 refills | Status: DC

## 2018-09-13 MED ORDER — AMLODIPINE BESYLATE 10 MG PO TABS: 10.0000 mg | ORAL_TABLET | Freq: Every day | ORAL | 3 refills | Status: DC

## 2018-09-13 MED ORDER — VITAMIN D (ERGOCALCIFEROL) 1.25 MG (50000 UNIT) PO CAPS: 50000.0000 [IU] | ORAL_CAPSULE | ORAL | 0 refills | Status: DC

## 2018-09-13 MED ORDER — POTASSIUM CHLORIDE ER 10 MEQ PO TBCR: 20.0000 meq | EXTENDED_RELEASE_TABLET | Freq: Every day | ORAL | 0 refills | Status: DC

## 2018-09-18 ENCOUNTER — Other Ambulatory Visit (INDEPENDENT_AMBULATORY_CARE_PROVIDER_SITE_OTHER): Payer: BC Managed Care – PPO

## 2018-09-18 ENCOUNTER — Other Ambulatory Visit: Payer: Self-pay | Admitting: Family Medicine

## 2018-09-18 ENCOUNTER — Other Ambulatory Visit: Payer: Self-pay

## 2018-09-18 DIAGNOSIS — E559 Vitamin D deficiency, unspecified: Secondary | ICD-10-CM

## 2018-09-18 DIAGNOSIS — E876 Hypokalemia: Secondary | ICD-10-CM | POA: Diagnosis not present

## 2018-09-18 LAB — BASIC METABOLIC PANEL
BUN: 11 mg/dL (ref 6–23)
CO2: 28 mEq/L (ref 19–32)
Calcium: 8.1 mg/dL — ABNORMAL LOW (ref 8.4–10.5)
Chloride: 100 mEq/L (ref 96–112)
Creatinine, Ser: 1.04 mg/dL (ref 0.40–1.50)
GFR: 89.79 mL/min (ref >60.00)
Glucose, Bld: 110 mg/dL — ABNORMAL HIGH (ref 70–99)
Potassium: 3.2 mEq/L — ABNORMAL LOW (ref 3.5–5.1)
Sodium: 139 mEq/L (ref 135–145)

## 2018-09-18 LAB — VITAMIN D 25 HYDROXY (VIT D DEFICIENCY, FRACTURES): VITD: 36.32 ng/mL (ref 30.00–100.00)

## 2018-09-18 LAB — MAGNESIUM: Magnesium: 1.7 mg/dL (ref 1.5–2.5)

## 2018-09-18 MED ORDER — POTASSIUM CHLORIDE ER 10 MEQ PO TBCR: 20.0000 meq | EXTENDED_RELEASE_TABLET | Freq: Every day | ORAL | 0 refills | Status: DC

## 2018-09-23 ENCOUNTER — Encounter: Payer: Self-pay | Admitting: Family Medicine

## 2018-09-23 ENCOUNTER — Telehealth: Payer: Self-pay | Admitting: Family Medicine

## 2018-09-23 NOTE — Telephone Encounter (Signed)
Called again left message to call back. See result notes

## 2018-09-23 NOTE — Telephone Encounter (Signed)
Pt calling back

## 2018-09-23 NOTE — Telephone Encounter (Signed)
Copied from [place] [id number]. Topic: General - Other >> Sep 23, 2018 10:43 AM Nils Flack wrote: Reason for CRM: pt is calling back, he says he had a missed call Please call [phone]   Please see result notes///I have called him twice left messages

## 2018-09-24 ENCOUNTER — Other Ambulatory Visit: Payer: Self-pay | Admitting: Family Medicine

## 2018-09-24 ENCOUNTER — Telehealth: Payer: Self-pay | Admitting: Family Medicine

## 2018-09-24 DIAGNOSIS — E876 Hypokalemia: Secondary | ICD-10-CM

## 2018-09-24 NOTE — Telephone Encounter (Signed)
Pt returning your call about lab results  

## 2018-09-24 NOTE — Telephone Encounter (Signed)
Have spoken to the patient already////see result notes

## 2018-09-25 ENCOUNTER — Telehealth: Payer: Self-pay | Admitting: Family Medicine

## 2018-09-25 ENCOUNTER — Encounter: Payer: Self-pay | Admitting: Family Medicine

## 2018-09-25 ENCOUNTER — Other Ambulatory Visit: Payer: Self-pay

## 2018-09-25 ENCOUNTER — Ambulatory Visit (INDEPENDENT_AMBULATORY_CARE_PROVIDER_SITE_OTHER): Payer: BC Managed Care – PPO | Admitting: Family Medicine

## 2018-09-25 DIAGNOSIS — R5383 Other fatigue: Secondary | ICD-10-CM | POA: Diagnosis not present

## 2018-09-25 DIAGNOSIS — Z20828 Contact with and (suspected) exposure to other viral communicable diseases: Secondary | ICD-10-CM

## 2018-09-25 DIAGNOSIS — M791 Myalgia, unspecified site: Secondary | ICD-10-CM | POA: Diagnosis not present

## 2018-09-25 NOTE — Telephone Encounter (Signed)
Pt wife called stating pt is still not feeling well. Yesterday he came home from work early, he was hanging his head, and just doesn't seem like himself. He started the new medication yesterday. She said he is nauseous, dizzy, lightheaded, and generally tired. He slept all evening/night last night. She mentioned pts father passed last year and that has been hard for him too. Please call pt at [phone].  Wife also asking for a return call at [phone].

## 2018-09-25 NOTE — Telephone Encounter (Signed)
Spoke to the patient informed of PCP instructions, He discussed his symptoms as exhaustion//nausea//vomiting and thinks something else going on. Scheduled doxy today with PCP to evaluate

## 2018-09-25 NOTE — Telephone Encounter (Signed)
Please schedule testing.

## 2018-09-25 NOTE — Telephone Encounter (Signed)
Copied from [place] [id number]. Topic: General - Other >> Sep 25, 2018 11:59 AM Keene Breath wrote: Reason for CRM: Patient's wife called to speak with the nurse regarding an upcoming appt.  CB# [phone] Patient has virtual visit with PCP today and will discuss

## 2018-09-25 NOTE — Progress Notes (Signed)
Chief Complaint  Patient presents with  . Fatigue  . Nausea  . Emesis    Subjective: Patient is a 53 y.o. male here for fatigue. Due to COVID-19 pandemic, we are interacting via web portal for an electronic face-to-face visit. I verified patient's ID using 2 identifiers. Patient agreed to proceed with visit via this method. Patient is at home, I am at office. Patient and I are present for visit.   Over past week, fatigue and vomiting. Has had muscle aches also. No sick contacts. Has not tried anything at home. Needs colonoscopy due to strong famhx in addition to sleep study for likely OSA which he has not had. Denies URI s/s's that are new, fevers, diarrhea.   ROS: Const: No fevers  Past Medical History:  Diagnosis Date  . Fatty liver   . Heart murmur   . Hypertension     Objective: No conversational dyspnea Age appropriate judgment and insight Nml affect and mood  Assessment and Plan: Myalgia - Plan: screen for covid  Fatigue, unspecified type - Plan: COVID screen, needs to set up appts with GI and pulm.   Will fax letter to work, will excuse through [place].  The patient voiced understanding and agreement to the plan.  [place], DO 09/25/18  3:33 PM

## 2018-09-25 NOTE — Telephone Encounter (Signed)
What is the status of his pulm referral for sleep study? That's the top of my list for fatigue and not feeling his normal self. I would not wait too long to schedule colonoscopy either. If he is able to monitor his BP at home, we could hold the metoprolol until these appointments are taken care of. Ty.

## 2018-09-26 ENCOUNTER — Telehealth: Payer: Self-pay | Admitting: General Practice

## 2018-09-26 DIAGNOSIS — Z20822 Contact with and (suspected) exposure to covid-19: Secondary | ICD-10-CM

## 2018-09-26 NOTE — Telephone Encounter (Signed)
lvm for pt at [facility] to return call for Covid test scheduling.  °

## 2018-09-26 NOTE — Telephone Encounter (Signed)
Pt has been scheduled for covid testing.  Scheduled apt with pt directly. Pt was referred by: [name], DO

## 2018-09-26 NOTE — Addendum Note (Signed)
Addended by: Dimple Nanas on: 09/26/2018 01:28 PM   Modules accepted: Orders

## 2018-09-27 ENCOUNTER — Other Ambulatory Visit: Payer: Self-pay | Admitting: Family Medicine

## 2018-09-27 ENCOUNTER — Other Ambulatory Visit: Payer: Self-pay

## 2018-09-27 DIAGNOSIS — R6889 Other general symptoms and signs: Secondary | ICD-10-CM | POA: Diagnosis not present

## 2018-09-27 DIAGNOSIS — Z20822 Contact with and (suspected) exposure to covid-19: Secondary | ICD-10-CM

## 2018-09-30 ENCOUNTER — Other Ambulatory Visit: Payer: BC Managed Care – PPO

## 2018-09-30 DIAGNOSIS — Z20822 Contact with and (suspected) exposure to covid-19: Secondary | ICD-10-CM

## 2018-10-01 LAB — NOVEL CORONAVIRUS, NAA: SARS-CoV-2, NAA: NOT DETECTED

## 2018-10-02 ENCOUNTER — Telehealth: Payer: Self-pay | Admitting: Family Medicine

## 2018-10-02 NOTE — Telephone Encounter (Signed)
Patient informed of negative test results.

## 2018-10-03 NOTE — Telephone Encounter (Signed)
Results faxed.

## 2018-10-03 NOTE — Telephone Encounter (Signed)
Pt's spouse called in requesting to have pt's results faxed to pt's employer?   Fax: [phone]- [address]

## 2018-10-15 ENCOUNTER — Other Ambulatory Visit: Payer: BC Managed Care – PPO

## 2018-10-18 ENCOUNTER — Other Ambulatory Visit (INDEPENDENT_AMBULATORY_CARE_PROVIDER_SITE_OTHER): Payer: BC Managed Care – PPO

## 2018-10-18 ENCOUNTER — Other Ambulatory Visit: Payer: Self-pay

## 2018-10-18 DIAGNOSIS — E876 Hypokalemia: Secondary | ICD-10-CM

## 2018-10-18 LAB — BASIC METABOLIC PANEL
BUN: 6 mg/dL (ref 6–23)
CO2: 22 mEq/L (ref 19–32)
Calcium: 8.9 mg/dL (ref 8.4–10.5)
Chloride: 103 mEq/L (ref 96–112)
Creatinine, Ser: 1.18 mg/dL (ref 0.40–1.50)
GFR: 77.59 mL/min (ref >60.00)
Glucose, Bld: 109 mg/dL — ABNORMAL HIGH (ref 70–99)
Potassium: 4.1 mEq/L (ref 3.5–5.1)
Sodium: 137 mEq/L (ref 135–145)

## 2018-10-21 ENCOUNTER — Encounter: Payer: Self-pay | Admitting: Family Medicine

## 2018-11-07 ENCOUNTER — Ambulatory Visit: Payer: Self-pay

## 2018-11-07 NOTE — Telephone Encounter (Signed)
Pt called stating that he feels weak He has numbness in his legs and feet He has back pain. His feet are cold to touch. He denies any discoloration to his feet. He states sometime he is numb between his legs groin area. He is SOB and light headed at times. He feels he has no appetite. Per protocol patient will go to ER for evaluation of symptoms. Care advice read to patient. Patient verbalized understanding of all instructions.  Reason for Disposition . [1] Back pain AND [2] numbness (loss of sensation) in groin or rectal area  Answer Assessment - Initial Assessment Questions 1. SYMPTOM: "What is the main symptom you are concerned about?" (e.g., weakness, numbness)     Weak numb feet feet cold 2. ONSET: "When did this start?" (minutes, hours, days; while sleeping)    4-5 months ago 3. LAST NORMAL: "When was the last time you were normal (no symptoms)?"    4-5 months 4. PATTERN "Does this come and go, or has it been constant since it started?"  "Is it present now?"   Relieves for moment and then comes back 5. CARDIAC SYMPTOMS: "Have you had any of the following symptoms: chest pain, difficulty breathing, palpitations?"     SOB sometimes 6. NEUROLOGIC SYMPTOMS: "Have you had any of the following symptoms: headache, dizziness, vision loss, double vision, changes in speech, unsteady on your feet?"     lightheaded 7. OTHER SYMPTOMS: "Do you have any other symptoms?"     No appitite 8. PREGNANCY: "Is there any chance you are pregnant?" "When was your last menstrual period?"     N/A  Protocols used: NEUROLOGIC DEFICIT-A-AH

## 2018-11-08 ENCOUNTER — Other Ambulatory Visit: Payer: Self-pay

## 2018-11-08 ENCOUNTER — Encounter: Payer: Self-pay | Admitting: Family Medicine

## 2018-11-08 ENCOUNTER — Ambulatory Visit (INDEPENDENT_AMBULATORY_CARE_PROVIDER_SITE_OTHER): Payer: BC Managed Care – PPO | Admitting: Family Medicine

## 2018-11-08 DIAGNOSIS — R0681 Apnea, not elsewhere classified: Secondary | ICD-10-CM | POA: Diagnosis not present

## 2018-11-08 DIAGNOSIS — M791 Myalgia, unspecified site: Secondary | ICD-10-CM

## 2018-11-08 DIAGNOSIS — R5383 Other fatigue: Secondary | ICD-10-CM | POA: Diagnosis not present

## 2018-11-08 MED ORDER — DULOXETINE HCL 30 MG PO CPEP: 30.0000 mg | ORAL_CAPSULE | Freq: Every day | ORAL | 3 refills | Status: DC

## 2018-11-08 NOTE — Telephone Encounter (Signed)
He would like an appt /// virtual or office visit.

## 2018-11-08 NOTE — Telephone Encounter (Signed)
Noted. Plz reach out to pt to see how he is doing. See if he had his colonoscopy yet also. Ty.

## 2018-11-08 NOTE — Telephone Encounter (Signed)
Colonscopy has not been scheduled and did give him the number to GI to schedule appt Symptoms are still the same.

## 2018-11-08 NOTE — Progress Notes (Signed)
CC: f/u fatigue  Subjective: Patient is a 53 y.o. male here for f/u. Due to COVID-19 pandemic, we are interacting via web portal for an electronic face-to-face visit. I verified patient's ID using 2 identifiers. Patient agreed to proceed with visit via this method. Patient is at home, I am at office. Patient and I are present for visit.   Pt continues to have fatigue. He also has diffuse aches and some tingling in his toes. He was referred to sleep team in June but has not gone yet.  He also has not had his colonoscopy.  There have been no changes in his diet or physical activity.  He is not having any fevers, nausea, or vomiting.  ROS: Endo: No wt changes Const: no fevers  Past Medical History:  Diagnosis Date  . Fatty liver   . Heart murmur   . Hypertension     Objective: No conversational dyspnea Age appropriate judgment and insight Nml affect and mood  Assessment and Plan: Witnessed apneic spells  Fatigue, unspecified type - Plan: DULoxetine (CYMBALTA) 30 MG capsule  Myalgia - Plan: DULoxetine (CYMBALTA) 30 MG capsule  Contact info for [facility]m provided. He was given [facility] info earlier today. I think he has sleep apnea causing most of his issues. Trial SNRI as above to see if things are helpful while he awaits specialist evaluation. F/u in 6 weeks.  The patient voiced understanding and agreement to the plan.  [place], DO 11/08/18  11:52 AM

## 2018-11-20 ENCOUNTER — Encounter: Payer: Self-pay | Admitting: Pulmonary Disease

## 2018-11-20 ENCOUNTER — Ambulatory Visit: Payer: BC Managed Care – PPO | Admitting: Pulmonary Disease

## 2018-11-20 ENCOUNTER — Other Ambulatory Visit: Payer: Self-pay

## 2018-11-20 VITALS — BP 124/80 | HR 100 | Temp 98.1°F | Ht 70.0 in | Wt 257.8 lb

## 2018-11-20 DIAGNOSIS — R0683 Snoring: Secondary | ICD-10-CM

## 2018-11-20 MED ORDER — ZOLPIDEM TARTRATE 10 MG PO TABS: 10.0000 mg | ORAL_TABLET | Freq: Every evening | ORAL | 1 refills | Status: DC | PRN

## 2018-11-20 NOTE — Patient Instructions (Signed)
Sleep onset and sleep maintenance insomnia The probability of significant obstructive sleep apnea  We will set you up for home sleep study  Trial with Ambien 10 mg nightly  We will see you back in the office in a couple of months  Call with significant concerns

## 2018-11-20 NOTE — Progress Notes (Signed)
Jorge Weaver    [id number]    1965-04-03  Primary Care Physician:Wendling, Crosby Oyster, DO  Referring Physician: [name], [address] [place],  [place] [address]  Chief complaint:   Patient with tiredness, fatigue  HPI:  Has had sleep issues for many years Has difficulty falling asleep, difficulty staying asleep  Normal bedtimes about 10 PM, takes him about 2 to 3 hours before he falls asleep wakes up between 2 and 3 times a night Final awakening time is 7 AM  Has a history of snoring, no witnessed apneas Admits to dryness of his mouth in the mornings No morning headaches  Occasional gasping respirations noted at night  No family history of obstructive sleep apnea Does not smoke, occasional beer  Memory fluctuates  Sleep is nonrestorative   Pets: No pets Occupation: No pertinent occupational history Exposures: No significant history of exposure Smoking history: Does not smoke  Outpatient Encounter Medications as of 11/20/2018  Medication Sig  . amLODipine (NORVASC) 10 MG tablet Take 1 tablet (10 mg total) by mouth daily.  . DULoxetine (CYMBALTA) 30 MG capsule Take 1 capsule (30 mg total) by mouth daily.  . metoprolol succinate (TOPROL-XL) 50 MG 24 hr tablet Take 1 tablet (50 mg total) by mouth daily. Take with or immediately following a meal.  . pantoprazole (PROTONIX) 40 MG tablet Take 1 tablet (40 mg total) by mouth daily.  . potassium chloride (KLOR-CON 10) 10 MEQ tablet Take 2 tablets (20 mEq total) by mouth daily.  . Vitamin D, Ergocalciferol, (DRISDOL) 1.25 MG (50000 UT) CAPS capsule Take 1 capsule (50,000 Units total) by mouth every 7 (seven) days.  Marland Kitchen ketoconazole (NIZORAL) 2 % cream Apply 1 application topically daily. (Patient not taking: Reported on 11/20/2018)  . zolpidem (AMBIEN) 10 MG tablet Take 1 tablet (10 mg total) by mouth at bedtime as needed for sleep.   No facility-administered encounter medications  on file as of 11/20/2018.     Allergies as of 11/20/2018  . (No Known Allergies)    Past Medical History:  Diagnosis Date  . Fatty liver   . Heart murmur   . Hypertension     Past Surgical History:  Procedure Laterality Date  . NO PAST SURGERIES    . WRIST SURGERY Left 2014    Family History  Problem Relation Age of Onset  . Hypertension Mother     Social History   Socioeconomic History  . Marital status: Married    Spouse name: Not on file  . Number of children: Not on file  . Years of education: Not on file  . Highest education level: Not on file  Occupational History  . Not on file  Social Needs  . Financial resource strain: Not on file  . Food insecurity    Worry: Not on file    Inability: Not on file  . Transportation needs    Medical: Not on file    Non-medical: Not on file  Tobacco Use  . Smoking status: Never Smoker  . Smokeless tobacco: Never Used  Substance and Sexual Activity  . Alcohol use: Yes    Alcohol/week: 3.0 standard drinks    Types: 3 Cans of beer per week  . Drug use: No  . Sexual activity: Not on file  Lifestyle  . Physical activity    Days per week: Not on file    Minutes per session: Not on  file  . Stress: Not on file  Relationships  . Social Musicianconnections    Talks on phone: Not on file    Gets together: Not on file    Attends religious service: Not on file    Active member of club or organization: Not on file    Attends meetings of clubs or organizations: Not on file    Relationship status: Not on file  . Intimate partner violence    Fear of current or ex partner: Not on file    Emotionally abused: Not on file    Physically abused: Not on file    Forced sexual activity: Not on file  Other Topics Concern  . Not on file  Social History Narrative  . Not on file    Review of Systems  Constitutional: Negative.   HENT: Negative.   Respiratory: Negative.  Negative for apnea and shortness of breath.   Cardiovascular:  Negative.   Gastrointestinal: Negative.   Psychiatric/Behavioral: Positive for sleep disturbance.    Vitals:   11/20/18 1351  BP: 124/80  Pulse: 100  Temp: 98.1 F (36.7 C)  SpO2: 95%     Physical Exam  Constitutional: He appears well-developed and well-nourished.  HENT:  Head: Normocephalic and atraumatic.  Mallampati 3, crowded oropharynx  Eyes: Conjunctivae are normal. Right eye exhibits no discharge.  Neck: Normal range of motion. Neck supple. No tracheal deviation present. No thyromegaly present.  Cardiovascular: Normal rate, regular rhythm and normal heart sounds.  Pulmonary/Chest: Breath sounds normal. No respiratory distress. He has no wheezes. He has no rales.  Abdominal: Soft. Bowel sounds are normal. He exhibits no distension. There is no abdominal tenderness. There is no rebound.   Epworth sleepiness scale of 4   Assessment:  Sleep onset and sleep maintenance insomnia -He is a Product/process development scientistworrier -No history of depression or anxiety  Fatigue  Nonrestorative sleep  Plan/Recommendations: Pathophysiology of sleep disordered breathing discussed with the patient Treatment options for sleep disordered breathing discussed with the patient Importance of regular exercises discussed with patient  We will schedule him for home sleep study  Trial with Ambien 10 mg for management of insomnia  Encouraged to call with any significant concerns   Virl DiamondAdewale Lori Popowski MD [facility] 11/20/2018, 2:08 PM  CC: Sharlene DoryWendling, Nicholas Paul*

## 2018-11-28 ENCOUNTER — Other Ambulatory Visit: Payer: Self-pay

## 2018-11-29 ENCOUNTER — Ambulatory Visit: Payer: BC Managed Care – PPO | Admitting: Medical

## 2018-11-29 ENCOUNTER — Encounter: Payer: Self-pay | Admitting: Medical

## 2018-11-29 ENCOUNTER — Ambulatory Visit (HOSPITAL_BASED_OUTPATIENT_CLINIC_OR_DEPARTMENT_OTHER)
Admission: RE | Admit: 2018-11-29 | Discharge: 2018-11-29 | Disposition: A | Discharge status: Home / Self Care | Payer: BC Managed Care – PPO | Source: Ambulatory Visit | Attending: Medical | Admitting: Medical

## 2018-11-29 VITALS — BP 116/85 | HR 94 | Temp 98.3°F | Resp 16 | Ht 70.0 in | Wt 262.4 lb

## 2018-11-29 DIAGNOSIS — G8929 Other chronic pain: Secondary | ICD-10-CM

## 2018-11-29 DIAGNOSIS — M545 Low back pain, unspecified: Secondary | ICD-10-CM

## 2018-11-29 DIAGNOSIS — M5137 Other intervertebral disc degeneration, lumbosacral region: Secondary | ICD-10-CM | POA: Diagnosis not present

## 2018-11-29 DIAGNOSIS — M4317 Spondylolisthesis, lumbosacral region: Secondary | ICD-10-CM | POA: Diagnosis not present

## 2018-11-29 MED ORDER — CYCLOBENZAPRINE HCL 5 MG PO TABS: ORAL_TABLET | ORAL | 0 refills | Status: DC

## 2018-11-29 MED ORDER — DICLOFENAC SODIUM 75 MG PO TBEC: 75.0000 mg | DELAYED_RELEASE_TABLET | Freq: Two times a day (BID) | ORAL | 0 refills | Status: DC

## 2018-11-29 NOTE — Patient Instructions (Addendum)
You had about 3 months of lumbar area back pain.  Some intermittent episodes of numbness both medial thigh areas of time.  Also some moderate to severe pain at times.  Will prescribe diclofenac and a low dose Flexeril.  Hopefully this combination will give you some relief.  Red flag signs symptoms reviewed that would indicate need for immediate evaluation.  I do want to go ahead and get lumbar spine x-ray today.  If your back pain begins to taper down then I want you to start back exercises below.  If symptoms persist or worsen will consider referral to sports medicine.  You might need MRI if symptoms persist or worsen.  Follow-up in 7 to 10 days or as needed.   Back Exercises These exercises help to make your trunk and back strong. They also help to keep the lower back flexible. Doing these exercises can help to prevent back pain or lessen existing pain.  If you have back pain, try to do these exercises 2-3 times each day or as told by your doctor.  As you get better, do the exercises once each day. Repeat the exercises more often as told by your doctor.  To stop back pain from coming back, do the exercises once each day, or as told by your doctor. Exercises Single knee to chest Do these steps 3-5 times in a row for each leg: 1. Lie on your back on a firm bed or the floor with your legs stretched out. 2. Bring one knee to your chest. 3. Grab your knee or thigh with both hands and hold them it in place. 4. Pull on your knee until you feel a gentle stretch in your lower back or buttocks. 5. Keep doing the stretch for 10-30 seconds. 6. Slowly let go of your leg and straighten it. Pelvic tilt Do these steps 5-10 times in a row: 1. Lie on your back on a firm bed or the floor with your legs stretched out. 2. Bend your knees so they point up to the ceiling. Your feet should be flat on the floor. 3. Tighten your lower belly (abdomen) muscles to press your lower back against the floor. This will  make your tailbone point up to the ceiling instead of pointing down to your feet or the floor. 4. Stay in this position for 5-10 seconds while you gently tighten your muscles and breathe evenly. Cat-cow Do these steps until your lower back bends more easily: 1. Get on your hands and knees on a firm surface. Keep your hands under your shoulders, and keep your knees under your hips. You may put padding under your knees. 2. Let your head hang down toward your chest. Tighten (contract) the muscles in your belly. Point your tailbone toward the floor so your lower back becomes rounded like the back of a cat. 3. Stay in this position for 5 seconds. 4. Slowly lift your head. Let the muscles of your belly relax. Point your tailbone up toward the ceiling so your back forms a sagging arch like the back of a cow. 5. Stay in this position for 5 seconds.  Press-ups Do these steps 5-10 times in a row: 1. Lie on your belly (face-down) on the floor. 2. Place your hands near your head, about shoulder-width apart. 3. While you keep your back relaxed and keep your hips on the floor, slowly straighten your arms to raise the top half of your body and lift your shoulders. Do not use your back  muscles. You may change where you place your hands in order to make yourself more comfortable. 4. Stay in this position for 5 seconds. 5. Slowly return to lying flat on the floor.  Bridges Do these steps 10 times in a row: 1. Lie on your back on a firm surface. 2. Bend your knees so they point up to the ceiling. Your feet should be flat on the floor. Your arms should be flat at your sides, next to your body. 3. Tighten your butt muscles and lift your butt off the floor until your waist is almost as high as your knees. If you do not feel the muscles working in your butt and the back of your thighs, slide your feet 1-2 inches farther away from your butt. 4. Stay in this position for 3-5 seconds. 5. Slowly lower your butt to the  floor, and let your butt muscles relax. If this exercise is too easy, try doing it with your arms crossed over your chest. Belly crunches Do these steps 5-10 times in a row: 1. Lie on your back on a firm bed or the floor with your legs stretched out. 2. Bend your knees so they point up to the ceiling. Your feet should be flat on the floor. 3. Cross your arms over your chest. 4. Tip your chin a little bit toward your chest but do not bend your neck. 5. Tighten your belly muscles and slowly raise your chest just enough to lift your shoulder blades a tiny bit off of the floor. Avoid raising your body higher than that, because it can put too much stress on your low back. 6. Slowly lower your chest and your head to the floor. Back lifts Do these steps 5-10 times in a row: 1. Lie on your belly (face-down) with your arms at your sides, and rest your forehead on the floor. 2. Tighten the muscles in your legs and your butt. 3. Slowly lift your chest off of the floor while you keep your hips on the floor. Keep the back of your head in line with the curve in your back. Look at the floor while you do this. 4. Stay in this position for 3-5 seconds. 5. Slowly lower your chest and your face to the floor. Contact a doctor if:  Your back pain gets a lot worse when you do an exercise.  Your back pain does not get better 2 hours after you exercise. If you have any of these problems, stop doing the exercises. Do not do them again unless your doctor says it is okay. Get help right away if:  You have sudden, very bad back pain. If this happens, stop doing the exercises. Do not do them again unless your doctor says it is okay. This information is not intended to replace advice given to you by your health care provider. Make sure you discuss any questions you have with your health care provider. Document Released: 04/08/2010 Document Revised: 11/29/2017 Document Reviewed: 11/29/2017 Elsevier Patient Education   2020 [employer].

## 2018-11-29 NOTE — Progress Notes (Signed)
Subjective:    Patient ID: Jorge Weaver, male    DOB: 1965-12-26, 53 y.o.   MRN: [id number]  HPI  Pt in for some back pain. Pt states pain for 2-3 months. Pain is gradually getting worse. No trauma or fall prior to onset. Pain lower back and beginning to have numbness medial aspect of thigh. Numbness comes and goes but no sharp radiating pain. Some pain in rt hip area.  Pain can vary and increase with activity but some times hurts just to sit.  Pt has not been evaluated for this pain. Pt has not been taking anything for pain.  At times will get 7-8/10 level pain. On average about 5/10 pain.   Review of Systems  Constitutional: Negative for chills, fatigue and fever.  Respiratory: Negative for cough, chest tightness, shortness of breath and wheezing.   Cardiovascular: Negative for chest pain and palpitations.  Gastrointestinal: Negative for abdominal pain.  Musculoskeletal: Positive for back pain.  Skin: Negative for rash.  Neurological: Negative for dizziness, speech difficulty, weakness and numbness.       Both side medial thigh sensation of numbness intermittent.   No incontinence or leg weakness. No sharp radicular type pain.  Hematological: Negative for adenopathy. Does not bruise/bleed easily.  Psychiatric/Behavioral: Negative for behavioral problems and confusion.    Past Medical History:  Diagnosis Date   Fatty liver    Heart murmur    Hypertension      Social History   Socioeconomic History   Marital status: Married    Spouse name: Not on file   Number of children: Not on file   Years of education: Not on file   Highest education level: Not on file  Occupational History   Not on file  Social Needs   Financial resource strain: Not on file   Food insecurity    Worry: Not on file    Inability: Not on file   Transportation needs    Medical: Not on file    Non-medical: Not on file  Tobacco Use   Smoking status: Never Smoker   Smokeless  tobacco: Never Used  Substance and Sexual Activity   Alcohol use: Yes    Alcohol/week: 3.0 standard drinks    Types: 3 Cans of beer per week   Drug use: No   Sexual activity: Not on file  Lifestyle   Physical activity    Days per week: Not on file    Minutes per session: Not on file   Stress: Not on file  Relationships   Social connections    Talks on phone: Not on file    Gets together: Not on file    Attends religious service: Not on file    Active member of club or organization: Not on file    Attends meetings of clubs or organizations: Not on file    Relationship status: Not on file   Intimate partner violence    Fear of current or ex partner: Not on file    Emotionally abused: Not on file    Physically abused: Not on file    Forced sexual activity: Not on file  Other Topics Concern   Not on file  Social History Narrative   Not on file    Past Surgical History:  Procedure Laterality Date   NO PAST SURGERIES     WRIST SURGERY Left 2014    Family History  Problem Relation Age of Onset   Hypertension Mother  No Known Allergies  Current Outpatient Medications on File Prior to Visit  Medication Sig Dispense Refill   amLODipine (NORVASC) 10 MG tablet Take 1 tablet (10 mg total) by mouth daily. 90 tablet 3   DULoxetine (CYMBALTA) 30 MG capsule Take 1 capsule (30 mg total) by mouth daily. 30 capsule 3   ketoconazole (NIZORAL) 2 % cream Apply 1 application topically daily. 15 g 0   metoprolol succinate (TOPROL-XL) 50 MG 24 hr tablet Take 1 tablet (50 mg total) by mouth daily. Take with or immediately following a meal. 90 tablet 3   pantoprazole (PROTONIX) 40 MG tablet Take 1 tablet (40 mg total) by mouth daily. 90 tablet 3   potassium chloride (KLOR-CON 10) 10 MEQ tablet Take 2 tablets (20 mEq total) by mouth daily. 180 tablet 0   Vitamin D, Ergocalciferol, (DRISDOL) 1.25 MG (50000 UT) CAPS capsule Take 1 capsule (50,000 Units total) by mouth every  7 (seven) days. 12 capsule 0   zolpidem (AMBIEN) 10 MG tablet Take 1 tablet (10 mg total) by mouth at bedtime as needed for sleep. 30 tablet 1   No current facility-administered medications on file prior to visit.     There were no vitals taken for this visit.      Objective:   Physical Exam  General Appearance- Not in acute distress.    Chest and Lung Exam Auscultation: Breath sounds:-Normal. Clear even and unlabored. Adventitious sounds:- No Adventitious sounds.  Cardiovascular Auscultation:Rythm - Regular, rate and rythm. Heart Sounds -Normal heart sounds.  Abdomen Inspection:-Inspection Normal.  Palpation/Perucssion: Palpation and Percussion of the abdomen reveal- Non Tender, No Rebound tenderness, No rigidity(Guarding) and No Palpable abdominal masses.  Liver:-Normal.  Spleen:- Normal.   Back Mid lumbar spine tenderness to palpation.  Bilateral SI tenderness to palpation. Pain on straight leg lift. Pain on lateral movements and flexion/extension of the spine.  Lower ext neurologic  L5-S1 sensation intact bilaterally. Normal patellar reflexes bilaterally. No foot drop bilaterally. No weakness of lower extremity.     Assessment & Plan:  You had about 3 months of lumbar area back pain.  Some intermittent episodes of numbness both medial thigh areas of time.  Also some moderate to severe pain at times.  Will prescribe diclofenac and a low dose Flexeril.  Hopefully this combination will give you some relief.  Red flag signs symptoms reviewed that would indicate need for immediate evaluation.  I do want to go ahead and get lumbar spine x-ray today.  If your back pain begins to taper down then I want you to start back exercises below.  If symptoms persist or worsen will consider referral to sports medicine.  You might need MRI if symptoms persist or worsen.  Follow-up in 7 to 10 days or as needed.  Mackie Pai, PA-C

## 2018-11-30 ENCOUNTER — Telehealth: Payer: Self-pay | Admitting: Medical

## 2018-11-30 DIAGNOSIS — G8929 Other chronic pain: Secondary | ICD-10-CM

## 2018-11-30 DIAGNOSIS — M5442 Lumbago with sciatica, left side: Secondary | ICD-10-CM

## 2018-11-30 NOTE — Telephone Encounter (Signed)
Mri lumbar spine placed.

## 2018-12-04 ENCOUNTER — Telehealth: Payer: Self-pay | Admitting: Medical

## 2018-12-04 MED ORDER — CYCLOBENZAPRINE HCL 5 MG PO TABS: ORAL_TABLET | ORAL | 0 refills | Status: DC

## 2018-12-04 MED ORDER — HYDROCODONE-ACETAMINOPHEN 5-325 MG PO TABS: 1.0000 | ORAL_TABLET | Freq: Four times a day (QID) | ORAL | 0 refills | Status: DC | PRN

## 2018-12-04 NOTE — Telephone Encounter (Signed)
Rx sent to pt pharmacy 

## 2018-12-07 ENCOUNTER — Ambulatory Visit (HOSPITAL_BASED_OUTPATIENT_CLINIC_OR_DEPARTMENT_OTHER)
Admission: RE | Admit: 2018-12-07 | Discharge: 2018-12-07 | Disposition: A | Discharge status: Home / Self Care | Payer: BC Managed Care – PPO | Source: Ambulatory Visit | Attending: Medical | Admitting: Medical

## 2018-12-07 ENCOUNTER — Telehealth: Payer: Self-pay | Admitting: Medical

## 2018-12-07 ENCOUNTER — Other Ambulatory Visit: Payer: Self-pay

## 2018-12-07 DIAGNOSIS — M5126 Other intervertebral disc displacement, lumbar region: Secondary | ICD-10-CM

## 2018-12-07 DIAGNOSIS — M5136 Other intervertebral disc degeneration, lumbar region: Secondary | ICD-10-CM | POA: Diagnosis not present

## 2018-12-07 DIAGNOSIS — M5127 Other intervertebral disc displacement, lumbosacral region: Secondary | ICD-10-CM | POA: Diagnosis not present

## 2018-12-07 DIAGNOSIS — G8929 Other chronic pain: Secondary | ICD-10-CM

## 2018-12-07 DIAGNOSIS — M5442 Lumbago with sciatica, left side: Secondary | ICD-10-CM | POA: Insufficient documentation

## 2018-12-07 DIAGNOSIS — M4807 Spinal stenosis, lumbosacral region: Secondary | ICD-10-CM | POA: Diagnosis not present

## 2018-12-07 DIAGNOSIS — M5441 Lumbago with sciatica, right side: Secondary | ICD-10-CM | POA: Diagnosis not present

## 2018-12-07 NOTE — Telephone Encounter (Signed)
Referral to neurosurgeon placed. 

## 2018-12-10 ENCOUNTER — Telehealth: Payer: Self-pay | Admitting: *Deleted

## 2018-12-10 DIAGNOSIS — Z6837 Body mass index (BMI) 37.0-37.9, adult: Secondary | ICD-10-CM | POA: Diagnosis not present

## 2018-12-10 DIAGNOSIS — M4317 Spondylolisthesis, lumbosacral region: Secondary | ICD-10-CM | POA: Diagnosis not present

## 2018-12-10 DIAGNOSIS — I10 Essential (primary) hypertension: Secondary | ICD-10-CM | POA: Diagnosis not present

## 2018-12-10 NOTE — Telephone Encounter (Signed)
Will await notes

## 2018-12-10 NOTE — Telephone Encounter (Signed)
Copied from [place] [id number]. Topic: General - Inquiry >> Dec 10, 2018 12:12 PM Richardo Priest, NT wrote: Reason for CRM: Patient's wife called in stating that husband has gone ahead and seen a neurologist. Neurologist is going to be sending over notes from findings, however, is wanting to have an order placed for a colonoscopy, prostate check, neurology, and testosterone level check ASAP. Doctor is wanting this all done ASAP before October 22nd so that way they can reevaluate patient at next appointment. Please advise and call wife back at [phone], when ordered and ready to schedule.

## 2018-12-11 ENCOUNTER — Other Ambulatory Visit: Payer: Self-pay

## 2018-12-11 ENCOUNTER — Ambulatory Visit: Payer: BC Managed Care – PPO

## 2018-12-11 DIAGNOSIS — R0683 Snoring: Secondary | ICD-10-CM

## 2018-12-11 DIAGNOSIS — G4710 Hypersomnia, unspecified: Secondary | ICD-10-CM | POA: Diagnosis not present

## 2018-12-13 ENCOUNTER — Encounter: Payer: Self-pay | Admitting: Gastroenterology

## 2018-12-24 ENCOUNTER — Ambulatory Visit: Payer: BC Managed Care – PPO | Admitting: Family Medicine

## 2018-12-24 ENCOUNTER — Encounter: Payer: Self-pay | Admitting: Family Medicine

## 2018-12-24 ENCOUNTER — Other Ambulatory Visit: Payer: Self-pay

## 2018-12-24 VITALS — BP 130/88 | HR 101 | Temp 95.0°F | Ht 70.0 in | Wt 270.0 lb

## 2018-12-24 DIAGNOSIS — G4710 Hypersomnia, unspecified: Secondary | ICD-10-CM | POA: Diagnosis not present

## 2018-12-24 DIAGNOSIS — R634 Abnormal weight loss: Secondary | ICD-10-CM

## 2018-12-24 DIAGNOSIS — Z125 Encounter for screening for malignant neoplasm of prostate: Secondary | ICD-10-CM

## 2018-12-24 DIAGNOSIS — M48061 Spinal stenosis, lumbar region without neurogenic claudication: Secondary | ICD-10-CM

## 2018-12-24 MED ORDER — GABAPENTIN 400 MG PO CAPS: 400.0000 mg | ORAL_CAPSULE | Freq: Three times a day (TID) | ORAL | 2 refills | Status: DC

## 2018-12-24 NOTE — Patient Instructions (Addendum)
Call the sleep team and ask for results of your sleep study.  If you do not hear anything about your referral in the next 1-2 weeks, call our office and ask for an update.  Heat (pad or rice pillow in microwave) over affected area, 10-15 minutes twice daily.   Ice/cold pack over area for 10-15 min twice daily.  OK to take Tylenol 1000 mg (2 extra strength tabs) or 975 mg (3 regular strength tabs) every 6 hours as needed.  Gabapentin/Neurontin: take 1 capsule at night for 3 days. If you do OK, add a capsule in the morning and continue the evening dose for 3 days. Lastly, add one in the afternoon for 3 times daily.   Let us know if you need anything.

## 2018-12-24 NOTE — Progress Notes (Signed)
Chief Complaint  Patient presents with  . Follow-up    Subjective: Patient is a 53 y.o. male here for f/u.  Patient was having issues with unintentional weight loss.  His last PSA was over a year ago.  It was recommended he have this checked again today.  He is okay to have his labs drawn.  The patient has a history of spinal stenosis and chronic low back pain.  He had an MRI that showed stenosis and a disc bulge without nerve impingement.  He will have intermittent weakness and numbness/tingling in his lower extremities.  He does not exercise routinely and has put on some weight again.  He has not had any issues with bowel/bladder incontinence.  He has tried without success anti-inflammatories, muscle relaxers, prednisone, and narcotics.  He has a history of a sleep disordered breathing with likely sleep apnea waiting on the result of his home sleep test.  He has not called his sleep team yet.  He has a colonoscopy later this month.   ROS: MSK: +back pain Neuro: +intermittent weakness  Past Medical History:  Diagnosis Date  . Fatty liver   . Heart murmur   . Hypertension     Objective: BP 130/88 (BP Location: Left Arm, Patient Position: Sitting, Cuff Size: Large)   Pulse (!) 101   Temp (!) 95 F (35 C) (Temporal)   Ht 5\' 10"  (1.778 m)   Wt 270 lb (122.5 kg)   SpO2 93%   BMI 38.74 kg/m  General: Awake, appears stated age HEENT: MMM, EOMi Heart: RRR Lungs: CTAB, no rales, wheezes or rhonchi. No accessory muscle use Neuro: DTRs equal and symmetric in the lower extremities bilaterally, no clonus, no cerebellar signs, antalgic gait, negative straight leg bilaterally MSK: Mild tenderness to palpation over the right lateral erector spinae muscle group at approximately T10-L2; there is very poor range of motion of his hamstrings; no tenderness to palpation over the midline, SI joint, or lumbar paraspinal musculature Psych: Age appropriate judgment and insight, normal affect and  mood  Assessment and Plan: Spinal stenosis of lumbar region, unspecified whether neurogenic claudication present - Plan: Ambulatory referral to Physical Therapy, gabapentin (NEURONTIN) 400 MG capsule  Screening for prostate cancer - Plan: PSA  Unintentional weight loss  Refer to physical therapy, heat, encourage (intentional) weight loss. Check PSA. I think his unintentional weight loss is improved as has many other of his issues since his neurosurgery visit.  Does not sound like a surgical issue so will treat conservatively, which he is okay with.  I will add gabapentin. Follow-up in 6 weeks. The patient voiced understanding and agreement to the plan.  [place], DO 12/24/18  5:05 PM

## 2018-12-25 ENCOUNTER — Telehealth: Payer: Self-pay | Admitting: Pulmonary Disease

## 2018-12-25 LAB — PSA: PSA: 0.3 ng/mL (ref 0.10–4.00)

## 2018-12-25 NOTE — Telephone Encounter (Signed)
Home sleep study results and recommendations-  Dr. Ander Slade has reviewed the home sleep test this test was negative for sleep apnea.  Dr . Ander Slade recommendation are optimize medications for sleep.   Sleep position optimization by encouraging sleep in lateral position, elevating the head of the bed by 30 degrees may help.   Encourage regular exercise and weight loss efforts. Caution against driving when sleepy and against medications with sedative side effects.  Called and spoke with Patient.  Dr. Judson Roch results and recommendations given.  Understanding stated.  Nothing further at this time.

## 2018-12-27 ENCOUNTER — Telehealth: Payer: Self-pay | Admitting: Family Medicine

## 2018-12-27 ENCOUNTER — Encounter: Payer: Self-pay | Admitting: Family Medicine

## 2018-12-27 DIAGNOSIS — R296 Repeated falls: Secondary | ICD-10-CM

## 2018-12-27 DIAGNOSIS — R531 Weakness: Secondary | ICD-10-CM

## 2018-12-27 NOTE — Telephone Encounter (Signed)
Patient's wife requesting a call back on her cell # [phone]

## 2018-12-27 NOTE — Telephone Encounter (Signed)
Called left message to call back 

## 2018-12-27 NOTE — Telephone Encounter (Signed)
The patients wife called this am with concern. The patient has been having many falls, they found him at the bottom of the steps last night and when trying to get him up his legs he was unable to move.  She also said he has lost control of his bladder many times and having numbness of legs and is causing falls.  She did say the neurosurgeon he saw was to send over notes of his visit with him. She is terrible concerned and does not know what to do and states the patient has not given PCP all information. She thinks he should see a neurologist Of referral is made please attach note to call his wife Lovell Sheehan as contact number [phone].

## 2018-12-27 NOTE — Telephone Encounter (Signed)
Pt's wife called back.  [facility] line states not in service.  Can't get through to appts line.

## 2018-12-27 NOTE — Telephone Encounter (Signed)
Jorge Weaver did not tell me he was falling or experiencing continued incontinence and it was not in note from [facility]. Agree with Neuro referral. I think that PT is still a good idea. Order placed. Ty.

## 2018-12-30 NOTE — Telephone Encounter (Signed)
Called and spoke to the patient informed of PCP response/referral placed. She verbalized understanding...had no other questions or concerns. Did let her know I will followup with referral coordinator on his PT/Neurology referral and make sure number to call is his wife's number for appts.

## 2018-12-31 ENCOUNTER — Encounter: Payer: Self-pay | Admitting: Diagnostic Neuroimaging

## 2018-12-31 ENCOUNTER — Other Ambulatory Visit: Payer: Self-pay

## 2018-12-31 ENCOUNTER — Encounter: Payer: Self-pay | Admitting: *Deleted

## 2018-12-31 ENCOUNTER — Ambulatory Visit: Payer: BC Managed Care – PPO | Admitting: Diagnostic Neuroimaging

## 2018-12-31 ENCOUNTER — Telehealth: Payer: Self-pay | Admitting: Diagnostic Neuroimaging

## 2018-12-31 VITALS — BP 121/82 | HR 93 | Temp 98.1°F | Ht 70.0 in | Wt 273.6 lb

## 2018-12-31 DIAGNOSIS — G959 Disease of spinal cord, unspecified: Secondary | ICD-10-CM

## 2018-12-31 NOTE — Patient Instructions (Signed)
-   MRI cervical and thoracic spine  - use cane  - no ladders or lifting weights

## 2018-12-31 NOTE — Telephone Encounter (Signed)
[employer] Auth: [id number] (exp. 12/31/18 to 06/28/19) order faxed to [facility] they will reach out to the patient to schedule. They will reach out to the patient to schedule.

## 2018-12-31 NOTE — Progress Notes (Signed)
[facility]  PATIENT: Jorge Weaver DOB: 01/05/1966  REFERRING CLINICIAN: N Wendling HISTORY FROM: patient  REASON FOR VISIT: new consult    HISTORICAL  CHIEF COMPLAINT:  Chief Complaint  Patient presents with  . Weakness, freq falls    rm 6 New Pt, "in past month my legs, feet went numb, can't keep my balance, loss of strength in my legs"    HISTORY OF PRESENT ILLNESS:   53 year old male here for evaluation of lower extremity numbness and weakness.  Symptoms started in June 2020 with numbness and weakness in his feet and legs.  Also having pain in his thighs, cramps in his hands, pain and low back.  Patient having erectile dysfunction and bladder incontinence.  Patient went to PCP and had MRI of the lumbar spine showing some degenerative changes, followed up with neurosurgery who recommended conservative management.    REVIEW OF SYSTEMS: Full 14 system review of systems performed and negative with exception of: As per HPI.  ALLERGIES: No Known Allergies  HOME MEDICATIONS: Outpatient Medications Prior to Visit  Medication Sig Dispense Refill  . amLODipine (NORVASC) 10 MG tablet Take 1 tablet (10 mg total) by mouth daily. 90 tablet 3  . diclofenac (VOLTAREN) 75 MG EC tablet Take 1 tablet (75 mg total) by mouth 2 (two) times daily. 30 tablet 0  . DULoxetine (CYMBALTA) 30 MG capsule Take 1 capsule (30 mg total) by mouth daily. 30 capsule 3  . gabapentin (NEURONTIN) 400 MG capsule Take 1 capsule (400 mg total) by mouth 3 (three) times daily. 90 capsule 2  . metoprolol succinate (TOPROL-XL) 50 MG 24 hr tablet Take 1 tablet (50 mg total) by mouth daily. Take with or immediately following a meal. 90 tablet 3  . potassium chloride (KLOR-CON 10) 10 MEQ tablet Take 2 tablets (20 mEq total) by mouth daily. 180 tablet 0  . pantoprazole (PROTONIX) 40 MG tablet Take 1 tablet (40 mg total) by mouth daily. 90 tablet 3  . Vitamin D, Ergocalciferol, (DRISDOL) 1.25 MG  (50000 UT) CAPS capsule Take 1 capsule (50,000 Units total) by mouth every 7 (seven) days. 12 capsule 0  . zolpidem (AMBIEN) 10 MG tablet Take 1 tablet (10 mg total) by mouth at bedtime as needed for sleep. 30 tablet 1   No facility-administered medications prior to visit.     PAST MEDICAL HISTORY: Past Medical History:  Diagnosis Date  . Fatty liver   . Heart murmur   . Hypertension     PAST SURGICAL HISTORY: Past Surgical History:  Procedure Laterality Date  . NO PAST SURGERIES    . WISDOM TOOTH EXTRACTION    . WRIST SURGERY Left 2014   fx    FAMILY HISTORY: Family History  Problem Relation Age of Onset  . Hypertension Mother   . Heart attack Father     SOCIAL HISTORY: Social History   Socioeconomic History  . Marital status: Married    Spouse name: Ella Bodo  . Number of children: 5  . Years of education: Not on file  . Highest education level: Associate degree: occupational, Hotel manager, or vocational program  Occupational History    Comment: electrician, heat/AC  Social Needs  . Financial resource strain: Not on file  . Food insecurity    Worry: Not on file    Inability: Not on file  . Transportation needs    Medical: Not on file    Non-medical: Not on file  Tobacco Use  . Smoking status:  Never Smoker  . Smokeless tobacco: Never Used  Substance and Sexual Activity  . Alcohol use: Yes    Alcohol/week: 3.0 standard drinks    Types: 3 Cans of beer per week  . Drug use: No  . Sexual activity: Not on file  Lifestyle  . Physical activity    Days per week: Not on file    Minutes per session: Not on file  . Stress: Not on file  Relationships  . Social Musician on phone: Not on file    Gets together: Not on file    Attends religious service: Not on file    Active member of club or organization: Not on file    Attends meetings of clubs or organizations: Not on file    Relationship status: Not on file  . Intimate partner violence    Fear of  current or ex partner: Not on file    Emotionally abused: Not on file    Physically abused: Not on file    Forced sexual activity: Not on file  Other Topics Concern  . Not on file  Social History Narrative   Lives with wife, child   Caffeine- occas     PHYSICAL EXAM  GENERAL EXAM/CONSTITUTIONAL: Vitals:  Vitals:   12/31/18 1051  BP: 121/82  Pulse: 93  Temp: 98.1 F (36.7 C)  Weight: 273 lb 9.6 oz (124.1 kg)  Height: 5\' 10"  (1.778 m)     Body mass index is 39.26 kg/m. Wt Readings from Last 3 Encounters:  12/31/18 273 lb 9.6 oz (124.1 kg)  12/24/18 270 lb (122.5 kg)  11/29/18 262 lb 6.4 oz (119 kg)     Patient is in no distress; well developed, nourished and groomed; neck is supple  CARDIOVASCULAR:  Examination of carotid arteries is normal; no carotid bruits  Regular rate and rhythm, no murmurs  Examination of peripheral vascular system by observation and palpation is normal  EYES:  Ophthalmoscopic exam of optic discs and posterior segments is normal; no papilledema or hemorrhages  No exam data present  MUSCULOSKELETAL:  Gait, strength, tone, movements noted in Neurologic exam below  NEUROLOGIC: MENTAL STATUS:  No flowsheet data found.  awake, alert, oriented to person, place and time  recent and remote memory intact  normal attention and concentration  language fluent, comprehension intact, naming intact  fund of knowledge appropriate  CRANIAL NERVE:   2nd - no papilledema on fundoscopic exam  2nd, 3rd, 4th, 6th - pupils equal and reactive to light, visual fields full to confrontation, extraocular muscles intact, no nystagmus  5th - facial sensation symmetric  7th - facial strength symmetric  8th - hearing intact  9th - palate elevates symmetrically, uvula midline  11th - shoulder shrug symmetric  12th - tongue protrusion midline  MOTOR:   normal bulk and tone, full strength in the BUE, BLE; EXCEPT DECR IN RIGHT TRICEPS (4) AND  RIGHT HIP FLEXION (4); SLIGHTLY INCR TONE IN RLE  SENSORY:   normal and symmetric to light touch; DECR IN RIGHT ARM AND RIGHT LEG  COORDINATION:   finger-nose-finger, fine finger movements normal  REFLEXES:   deep tendon reflexes --> SLIGHTLY BRISK IN RUE AND BLE; POSITIVE HOFFMAN'S BILATERALLY  GAIT/STATION:   ANTALGIC, UNSTEADY GAIT     DIAGNOSTIC DATA (LABS, IMAGING, TESTING) - I reviewed patient records, labs, notes, testing and imaging myself where available.  Lab Results  Component Value Date   WBC 3.6 (L) 06/17/2018   HGB  13.9 06/17/2018   HCT 40.2 06/17/2018   MCV 91.1 06/17/2018   PLT 183.0 06/17/2018      Component Value Date/Time   NA 137 10/18/2018 1021   K 4.1 10/18/2018 1021   CL 103 10/18/2018 1021   CO2 22 10/18/2018 1021   GLUCOSE 109 (H) 10/18/2018 1021   BUN 6 10/18/2018 1021   CREATININE 1.18 10/18/2018 1021   CALCIUM 8.9 10/18/2018 1021   PROT 7.1 06/17/2018 1355   ALBUMIN 3.3 (L) 06/17/2018 1355   AST 363 (H) 06/17/2018 1355   ALT 324 (H) 06/17/2018 1355   ALKPHOS 147 (H) 06/17/2018 1355   BILITOT 2.3 (H) 06/17/2018 1355   GFRNONAA >60 06/14/2018 1800   GFRAA >60 06/14/2018 1800   Lab Results  Component Value Date   CHOL 147 05/09/2017   HDL 57.30 05/09/2017   LDLCALC 82 05/09/2017   TRIG 36.0 05/09/2017   CHOLHDL 3 05/09/2017   Lab Results  Component Value Date   HGBA1C 6.1 08/07/2017   No results found for: VITAMINB12 Lab Results  Component Value Date   TSH 0.56 06/17/2018    12/07/18 MRI LUMBAR SPINE [I reviewed images myself and agree with interpretation. -VRP]  1.  No acute osseous injury of the lumbar spine. 2. At L5-S1 there is a broad-based disc bulge. Moderate bilateral facet arthropathy. Mild right and severe left foraminal stenosis. Prominence of the epidural fat deforming the thecal sac as can be seen with epidural lipomatosis. 3. At L4-5 there is a mild broad-based disc bulge with a right lateral annular  fissure. Moderate bilateral facet arthropathy with bilateral facet effusions. Mild spinal stenosis.     ASSESSMENT AND PLAN  53 y.o. year old male here with new onset upper and lower extremity weakness, numbness, erectile dysfunction, bladder dysfunction; exam shows right upper and lower extremity weakness, unsteady spastic gait, hyperreflexia.  Concern for progressive cervical myelopathy.  Localization:  Ddx:  1. Myelopathy (HCC)      PLAN:  - MRI cervical and thoracic spine (eval for myelopathy) - STAT - use cane; fall precautions - no ladders or lifting weights; no work until work further testing (works as Teaching laboratory technicianmaintenance supervisor)  Orders Placed This Encounter  Procedures  . MR CERVICAL SPINE WO CONTRAST  . MR THORACIC SPINE WO CONTRAST   Return pending test results, for pending if symptoms worsen or fail to improve.    Suanne MarkerVIKRAM R. Kuper Rennels, MD 12/31/2018, 11:52 AM Certified in Neurology, Neurophysiology and Neuroimaging  [facility] [address], [address] [place], [place] [id number] [phone]

## 2019-01-01 ENCOUNTER — Ambulatory Visit (AMBULATORY_SURGERY_CENTER): Payer: Self-pay | Admitting: *Deleted

## 2019-01-01 ENCOUNTER — Other Ambulatory Visit: Payer: Self-pay

## 2019-01-01 VITALS — Temp 95.1°F | Ht 70.0 in | Wt 274.8 lb

## 2019-01-01 DIAGNOSIS — Z1211 Encounter for screening for malignant neoplasm of colon: Secondary | ICD-10-CM

## 2019-01-01 MED ORDER — NA SULFATE-K SULFATE-MG SULF 17.5-3.13-1.6 GM/177ML PO SOLN: 1.0000 | Freq: Once | ORAL | 0 refills | Status: AC

## 2019-01-01 NOTE — Progress Notes (Signed)
No egg or soy allergy known to patient  No issues with past sedation with any surgeries  or procedures, no intubation problems  No diet pills per patient No home 02 use per patient  No blood thinners per patient  Pt denies issues with constipation  No A fib or A flutter  EMMI video sent to pt's e mail   Due to the COVID-19 pandemic we are asking patients to follow these guidelines. Please only bring one care partner. Please be aware that your care partner may wait in the car in the parking lot or if they feel like they will be too hot to wait in the car, they may wait in the lobby on the 4th floor. All care partners are required to wear a mask the entire time (we do not have any that we can provide them), they need to practice social distancing, and we will do a Covid check for all patient's and care partners when you arrive. Also we will check their temperature and your temperature. If the care partner waits in their car they need to stay in the parking lot the entire time and we will call them on their cell phone when the patient is ready for discharge so they can bring the car to the front of the building. Also all patient's will need to wear a mask into building.  Pt. Declined covid screening test stated "I just took one."

## 2019-01-01 NOTE — Telephone Encounter (Signed)
pt scheduled for 01/02/19 at [place].

## 2019-01-02 DIAGNOSIS — G959 Disease of spinal cord, unspecified: Secondary | ICD-10-CM | POA: Diagnosis not present

## 2019-01-02 DIAGNOSIS — M47812 Spondylosis without myelopathy or radiculopathy, cervical region: Secondary | ICD-10-CM | POA: Diagnosis not present

## 2019-01-02 DIAGNOSIS — M47819 Spondylosis without myelopathy or radiculopathy, site unspecified: Secondary | ICD-10-CM | POA: Diagnosis not present

## 2019-01-03 ENCOUNTER — Telehealth: Payer: Self-pay | Admitting: Neurology

## 2019-01-03 ENCOUNTER — Encounter: Payer: Self-pay | Admitting: Gastroenterology

## 2019-01-03 DIAGNOSIS — G959 Disease of spinal cord, unspecified: Secondary | ICD-10-CM

## 2019-01-03 NOTE — Telephone Encounter (Signed)
I got a page from [facility], re: MRI c spine showing abn finding:  Spoke with Margaretha Sheffield, the MR technologist, who read report to me and also spoke with the radiologist, who read the T and C spine MRIs:  The scans were wo contrast and pt has degen. Spondylosis on multiple levels, no overt disc herniation, signal abn in the cord at C5/6 and C6/7 and DD: would include sp cord contusion, however, no Hx of trauma documented. Also, albeit less typical presentation, could be underlying demyelinating d/s vs. Sp cord metastasis. Rec: MRI c spine w contrast.  I also wonder, if a brain MRI w/wo should be added?  Dr. Leta Baptist, pls review on Monday and order additional test and get in touch with pt. Thanks.

## 2019-01-06 ENCOUNTER — Telehealth: Payer: Self-pay | Admitting: Diagnostic Neuroimaging

## 2019-01-06 DIAGNOSIS — G959 Disease of spinal cord, unspecified: Secondary | ICD-10-CM

## 2019-01-06 NOTE — Telephone Encounter (Signed)
MRI cervical spine with contrast placed.

## 2019-01-06 NOTE — Telephone Encounter (Signed)
Jorge Weaver with [facility] said the MRI Cervical spine needs to be MR Cervical spine with contrast. When you get a chance can you switch the order thank you!

## 2019-01-06 NOTE — Telephone Encounter (Signed)
I reviewed imaging via disc and there is T2 hyperintense signal within the spinal cord at the C6-7 level adjacent to degenerative spondylosis and disc protrusion resulting in moderate spinal stenosis.  However abnormal signal extends cranially and caudally.  Could represent degenerative cervical myelopathy, spinal cord contusion versus other autoimmune, inflammatory or neoplastic etiologies.  Will order MRI brain with and without, postcontrast cervical spine imaging, for expedited work-up.  We will also refer to neurosurgery for consultation. Overall, I think this finding is more likely related to degenerative spondylosis and myelopathy rather than an autoimmune, inflammatory or neoplastic etiology.   Orders Placed This Encounter  Procedures  . MR BRAIN W WO CONTRAST  . MR CERVICAL SPINE W WO CONTRAST  . Ambulatory referral to Neurosurgery    Penni Bombard, MD 76/22/6333, 54:56 AM Certified in Neurology, Neurophysiology and Neuroimaging  [facility] [address] [place], [place] [address] [phone]

## 2019-01-06 NOTE — Addendum Note (Signed)
Addended by: Andrey Spearman R on: 01/06/2019 10:38 AM   Modules accepted: Orders

## 2019-01-06 NOTE — Addendum Note (Signed)
Addended by: Florian Buff C on: 01/06/2019 12:01 PM   Modules accepted: Orders

## 2019-01-06 NOTE — Telephone Encounter (Signed)
Received fax from [facility] re:of MRI reports of cervical, thoracic spine. Placed on Dr [employer] desk for review.

## 2019-01-06 NOTE — Telephone Encounter (Signed)
[employer] Auth: [id number] (exp. 01/06/19 to 07/04/19) order faxed to [facility] they will reach out to the patient to schedule.

## 2019-01-07 NOTE — Telephone Encounter (Signed)
Patient wants to know why so many MRI's are being ing ordered. Please call .

## 2019-01-07 NOTE — Telephone Encounter (Signed)
Just an Delena Serve with [facility] spoke to the patient  Okay so I just spoke with Jorge Weaver and he wants the results of the first scans before he schedules these 2 scans. He said no one has called him with results nor told him he needs more exams.  I told him to call your office for the results and discuss with Dr. Leta Baptist.

## 2019-01-07 NOTE — Telephone Encounter (Signed)
Called patient and male answered. When I asked to speak to patient the call was ended. I called back, went to voice mail. I LVM requesting call back for MRI results.

## 2019-01-07 NOTE — Telephone Encounter (Signed)
See phone note dated 01/03/2019.

## 2019-01-07 NOTE — Telephone Encounter (Signed)
I called patient's wife to review MRI results.  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  He has neurosurgery appointment on Thursday and follow-up MRI scans next week, to rule out other autoimmune or inflammatory etiologies. - VRP

## 2019-01-07 NOTE — Telephone Encounter (Signed)
Patient will go ahead with scans now. -VRP

## 2019-01-07 NOTE — Telephone Encounter (Signed)
Patient wife called back wanting the results of the MRI. The best number to contact her is [phone].

## 2019-01-07 NOTE — Telephone Encounter (Signed)
Patients wife call wanting her husband MRI results. I was advise by Stanton Kidney RN  to transfer call to [place]. The call was transfer for results and test order.

## 2019-01-09 ENCOUNTER — Other Ambulatory Visit: Payer: Self-pay | Admitting: Neurosurgery

## 2019-01-09 DIAGNOSIS — M4712 Other spondylosis with myelopathy, cervical region: Secondary | ICD-10-CM | POA: Diagnosis not present

## 2019-01-13 NOTE — Progress Notes (Signed)
[facility] - [place],  - [address] [place] [address] Phone: [phone] Fax: [phone]  [id number], [place] - [address] AT [facility] [address] [place] [address] Phone: [phone] Fax: [phone]    Your procedure is scheduled on Thursday, October 29th.  Report to [facility] Main Entrance "A" at 10:30 A.M., and check in at the Admitting office.  Call this number if you have problems the morning of surgery:  [phone]  Call [phone] if you have any questions prior to your surgery date Monday-Friday 8am-4pm   Remember:  Do not eat or drink after midnight the night before your surgery   Take these medicines the morning of surgery with A SIP OF WATER  amLODipine (NORVASC)  metoprolol succinate (TOPROL-XL)   If needed -   As of today, STOP taking any Aspirin (unless otherwise instructed by your surgeon), diclofenac (VOLTAREN), Aleve, Naproxen, Ibuprofen, Motrin, Advil, Goody's, BC's, all herbal medications, fish oil, and all vitamins.    The Morning of Surgery  Do not wear jewelry, make-up or nail polish.  Do not wear lotions, powders, colognes, or deodorant  Do not shave 48 hours prior to surgery.    Men may shave face and neck.  Do not bring valuables to the hospital.  [facility] is not responsible for any belongings or valuables.  If you are a smoker, DO NOT Smoke 24 hours prior to surgery IF you wear a CPAP at night please bring your mask, tubing, and machine the morning of surgery   Remember that you must have someone to transport you home after your surgery, and remain with you for 24 hours if you are discharged the same day.  Contacts, glasses, hearing aids, dentures or bridgework may not be worn into surgery.   Leave your suitcase in the car.  After surgery it may be brought to your room.  For patients admitted to the hospital, discharge time will be determined by your  treatment team.  Patients discharged the day of surgery will not be allowed to drive home.   Special instructions:   [facility]- Preparing For Surgery  Before surgery, you can play an important role. Because skin is not sterile, your skin needs to be as free of germs as possible. You can reduce the number of germs on your skin by washing with CHG (chlorahexidine gluconate) Soap before surgery.  CHG is an antiseptic cleaner which kills germs and bonds with the skin to continue killing germs even after washing.    Oral Hygiene is also important to reduce your risk of infection.  Remember - BRUSH YOUR TEETH THE MORNING OF SURGERY WITH YOUR REGULAR TOOTHPASTE  Please do not use if you have an allergy to CHG or antibacterial soaps. If your skin becomes reddened/irritated stop using the CHG.  Do not shave (including legs and underarms) for at least 48 hours prior to first CHG shower. It is OK to shave your face.  Please follow these instructions carefully.   1. Shower the NIGHT BEFORE SURGERY and the MORNING OF SURGERY with CHG Soap.   2. If you chose to wash your hair, wash your hair first as usual with your normal shampoo.  3. After you shampoo, rinse your hair and body thoroughly to remove the shampoo.  4. Use CHG as you would any other liquid soap. You can apply CHG directly to the skin and wash  gently with a scrungie or a clean washcloth.   5. Apply the CHG Soap to your body ONLY FROM THE NECK DOWN.  Do not use on open wounds or open sores. Avoid contact with your eyes, ears, mouth and genitals (private parts). Wash Face and genitals (private parts)  with your normal soap.   6. Wash thoroughly, paying special attention to the area where your surgery will be performed.  7. Thoroughly rinse your body with warm water from the neck down.  8. DO NOT shower/wash with your normal soap after using and rinsing off the CHG Soap.  9. Pat yourself dry with a CLEAN TOWEL.  10. Wear CLEAN PAJAMAS  to bed the night before surgery, wear comfortable clothes the morning of surgery  11. Place CLEAN SHEETS on your bed the night of your first shower and DO NOT SLEEP WITH PETS.  Day of Surgery: Do not apply any deodorants/lotions. Please shower the morning of surgery with the CHG soap  Please wear clean clothes to the hospital/surgery center.   Remember to brush your teeth WITH YOUR REGULAR TOOTHPASTE.  Please read over the following fact sheets that you were given.

## 2019-01-14 ENCOUNTER — Other Ambulatory Visit: Payer: Self-pay

## 2019-01-14 ENCOUNTER — Encounter (HOSPITAL_COMMUNITY): Payer: Self-pay

## 2019-01-14 ENCOUNTER — Other Ambulatory Visit (HOSPITAL_COMMUNITY)
Admission: RE | Admit: 2019-01-14 | Discharge: 2019-01-14 | Disposition: A | Discharge status: Home / Self Care | Payer: BC Managed Care – PPO | Source: Ambulatory Visit | Attending: Neurosurgery | Admitting: Neurosurgery

## 2019-01-14 ENCOUNTER — Encounter (HOSPITAL_COMMUNITY)
Admission: RE | Admit: 2019-01-14 | Discharge: 2019-01-14 | Disposition: A | Discharge status: Home / Self Care | Payer: BC Managed Care – PPO | Source: Ambulatory Visit | Attending: Neurosurgery | Admitting: Neurosurgery

## 2019-01-14 DIAGNOSIS — Z01818 Encounter for other preprocedural examination: Secondary | ICD-10-CM | POA: Insufficient documentation

## 2019-01-14 DIAGNOSIS — I10 Essential (primary) hypertension: Secondary | ICD-10-CM | POA: Insufficient documentation

## 2019-01-14 HISTORY — DX: Anemia, unspecified: D64.9

## 2019-01-14 HISTORY — DX: Gastro-esophageal reflux disease without esophagitis: K21.9

## 2019-01-14 HISTORY — DX: Pneumonia, unspecified organism: J18.9

## 2019-01-14 LAB — CBC
HCT: 42.1 % (ref 39.0–52.0)
Hemoglobin: 14.2 g/dL (ref 13.0–17.0)
MCH: 35.1 pg — ABNORMAL HIGH (ref 26.0–34.0)
MCHC: 33.7 g/dL (ref 30.0–36.0)
MCV: 104.2 fL — ABNORMAL HIGH (ref 80.0–100.0)
Platelets: 203 10*3/uL (ref 150–400)
RBC: 4.04 MIL/uL — ABNORMAL LOW (ref 4.22–5.81)
RDW: 14.1 % (ref 11.5–15.5)
WBC: 4.4 10*3/uL (ref 4.0–10.5)
nRBC: 0 % (ref 0.0–0.2)

## 2019-01-14 LAB — SARS CORONAVIRUS 2 (TAT 6-24 HRS): SARS Coronavirus 2: NEGATIVE

## 2019-01-14 LAB — BASIC METABOLIC PANEL
Anion gap: 11 (ref 5–15)
BUN: 3 mg/dL — ABNORMAL LOW (ref 6–20)
CO2: 27 mmol/L (ref 22–32)
Calcium: 9.1 mg/dL (ref 8.9–10.3)
Chloride: 102 mmol/L (ref 98–111)
Creatinine, Ser: 1.02 mg/dL (ref 0.61–1.24)
GFR calc Af Amer: >60 mL/min (ref >60)
GFR calc non Af Amer: >60 mL/min (ref >60)
Glucose, Bld: 111 mg/dL — ABNORMAL HIGH (ref 70–99)
Potassium: 3.8 mmol/L (ref 3.5–5.1)
Sodium: 140 mmol/L (ref 135–145)

## 2019-01-14 LAB — ABO/RH: ABO/RH(D): A POS

## 2019-01-14 LAB — TYPE AND SCREEN
ABO/RH(D): A POS
Antibody Screen: NEGATIVE

## 2019-01-14 LAB — SURGICAL PCR SCREEN
MRSA, PCR: NEGATIVE
Staphylococcus aureus: POSITIVE — AB

## 2019-01-14 NOTE — Progress Notes (Addendum)
PCP - Dr. Riki Sheer Cardiologist - denies  PPM/ICD - N/A Device Orders -  Rep Notified -   Chest x-ray - N/A EKG - today Stress Test - denies ECHO - denies Cardiac Cath - denies  Sleep Study - 12/11/18 (negative per pt) CPAP - no  Fasting Blood Sugar -  N/A Checks Blood Sugar _____ times a day  Blood Thinner Instructions: N/A Aspirin Instructions: N/A  ERAS Protcol - No PRE-SURGERY Ensure or G2- N/A  COVID TEST- to be done today 82/70/78  Visitation policy reviewed with pt and he voiced understanding.   Anesthesia review: No  Patient denies shortness of breath, fever, cough and chest pain at PAT appointment   All instructions explained to the patient, with a verbal understanding of the material. Patient agrees to go over the instructions while at home for a better understanding. Patient also instructed to self quarantine after being tested for COVID-19. The opportunity to ask questions was provided.

## 2019-01-14 NOTE — Pre-Procedure Instructions (Signed)
Jorge Weaver  01/14/2019    Your procedure is scheduled on Thursday, January 16, 2019 at 12:30 PM.   Report to [facility] Entrance "A" Admitting Office at 10:30 AM.   Call this number if you have problems the morning of surgery: [phone]   Questions prior to day of surgery, please call [phone] between 8 & 4 PM.   Remember:  Do not eat or drink after midnight Wednesday, 01/15/19.  Take these medicines the morning of surgery with A SIP OF WATER: Amlodipine (Norvasc), Metoprolol (Toprol XL)  Stop NSAIDS (Diclofenac (Voltaren), Ibuprofen, Aleve, etc) as of today prior to surgery. Do not use Aspirin products (BC Powders, Goody's, etc), Fish Oil, Herbal medications or Multivitamins prior to surgery.    Do not wear jewelry.  Do not wear lotions, powders, cologne deodorant.  Men may shave face and neck.  Do not bring valuables to the hospital.  [facility] is not responsible for any belongings or valuables.  Contacts, dentures or bridgework may not be worn into surgery.  Leave your suitcase in the car.  After surgery it may be brought to your room.  For patients admitted to the hospital, discharge time will be determined by your treatment team.  Patients discharged the day of surgery will not be allowed to drive home.   [facility] - Preparing for Surgery  Before surgery, you can play an important role.  Because skin is not sterile, your skin needs to be as free of germs as possible.  You can reduce the number of germs on you skin by washing with CHG (chlorahexidine gluconate) soap before surgery.  CHG is an antiseptic cleaner which kills germs and bonds with the skin to continue killing germs even after washing.  Oral Hygiene is also important in reducing the risk of infection.  Remember to brush your teeth with your regular toothpaste the morning of surgery.  Please DO NOT use if you have an allergy to CHG or antibacterial soaps.  If your skin becomes  reddened/irritated stop using the CHG and inform your nurse when you arrive at Short Stay.  Do not shave (including legs and underarms) for at least 48 hours prior to the first CHG shower.  You may shave your face.  Please follow these instructions carefully:   1.  Shower with CHG Soap the night before surgery and the morning of Surgery.  2.  If you choose to wash your hair, wash your hair first as usual with your normal shampoo.  3.  After you shampoo, rinse your hair and body thoroughly to remove the shampoo. 4.  Use CHG as you would any other liquid soap.  You can apply chg directly to the skin and wash gently with a      scrungie or washcloth.           5.  Apply the CHG Soap to your body ONLY FROM THE NECK DOWN.   Do not use on open wounds or open sores. Avoid contact with your eyes, ears, mouth and genitals (private parts).  Wash genitals (private parts) with your normal soap - do this prior to using the CHG soap.  6.  Wash thoroughly, paying special attention to the area where your surgery will be performed.  7.  Thoroughly rinse your body with warm water from the neck down.  8.  DO NOT shower/wash with your normal soap after using and rinsing off the CHG Soap.  9.  Pat yourself dry  with a clean towel.            10.  Wear clean pajamas.            11.  Place clean sheets on your bed the night of your first shower and do not sleep with pets.  Day of Surgery  Shower as above. Do not apply any lotions/deodorants the morning of surgery.   Please wear clean clothes to the hospital. Remember to brush your teeth with toothpaste.   Please read over the fact sheets that you were given.

## 2019-01-14 NOTE — Telephone Encounter (Signed)
Jorge Weaver with [facility] messaged me and informed me  Patient no show for his double study.  Wife just called and said they were at a preop appt. at [facility] for surgery.  She said she will call back to r/s.

## 2019-01-15 ENCOUNTER — Encounter: Payer: BC Managed Care – PPO | Admitting: Gastroenterology

## 2019-01-15 MED ORDER — DEXTROSE 5 % IV SOLN
3.0000 g | INTRAVENOUS | Status: AC
  Administered 2019-01-16: 3 g via INTRAVENOUS
  Filled 2019-01-15: qty 3

## 2019-01-16 ENCOUNTER — Ambulatory Visit (HOSPITAL_COMMUNITY): Payer: BC Managed Care – PPO

## 2019-01-16 ENCOUNTER — Observation Stay (HOSPITAL_COMMUNITY)
Admission: AD | Admit: 2019-01-16 | Discharge: 2019-01-17 | DRG: 473 | Disposition: A | Discharge status: Home / Self Care | Payer: BC Managed Care – PPO | Attending: Neurosurgery | Admitting: Neurosurgery

## 2019-01-16 ENCOUNTER — Encounter (HOSPITAL_COMMUNITY)
Admission: AD | Disposition: A | Discharge status: Home / Self Care | Payer: Self-pay | Source: Home / Self Care | Attending: Neurosurgery

## 2019-01-16 ENCOUNTER — Other Ambulatory Visit: Payer: Self-pay

## 2019-01-16 ENCOUNTER — Encounter (HOSPITAL_COMMUNITY): Payer: Self-pay

## 2019-01-16 DIAGNOSIS — R32 Unspecified urinary incontinence: Secondary | ICD-10-CM | POA: Insufficient documentation

## 2019-01-16 DIAGNOSIS — I10 Essential (primary) hypertension: Secondary | ICD-10-CM | POA: Insufficient documentation

## 2019-01-16 DIAGNOSIS — Z79899 Other long term (current) drug therapy: Secondary | ICD-10-CM | POA: Insufficient documentation

## 2019-01-16 DIAGNOSIS — M4722 Other spondylosis with radiculopathy, cervical region: Secondary | ICD-10-CM | POA: Insufficient documentation

## 2019-01-16 DIAGNOSIS — N539 Unspecified male sexual dysfunction: Secondary | ICD-10-CM | POA: Insufficient documentation

## 2019-01-16 DIAGNOSIS — M4712 Other spondylosis with myelopathy, cervical region: Secondary | ICD-10-CM | POA: Diagnosis present

## 2019-01-16 DIAGNOSIS — R2689 Other abnormalities of gait and mobility: Secondary | ICD-10-CM | POA: Insufficient documentation

## 2019-01-16 DIAGNOSIS — M4802 Spinal stenosis, cervical region: Secondary | ICD-10-CM | POA: Diagnosis not present

## 2019-01-16 DIAGNOSIS — Z419 Encounter for procedure for purposes other than remedying health state, unspecified: Secondary | ICD-10-CM

## 2019-01-16 DIAGNOSIS — F419 Anxiety disorder, unspecified: Secondary | ICD-10-CM | POA: Diagnosis not present

## 2019-01-16 DIAGNOSIS — M4322 Fusion of spine, cervical region: Secondary | ICD-10-CM | POA: Diagnosis not present

## 2019-01-16 DIAGNOSIS — J45909 Unspecified asthma, uncomplicated: Secondary | ICD-10-CM | POA: Diagnosis not present

## 2019-01-16 DIAGNOSIS — R7303 Prediabetes: Secondary | ICD-10-CM | POA: Diagnosis not present

## 2019-01-16 HISTORY — PX: ANTERIOR CERVICAL DECOMP/DISCECTOMY FUSION: SHX1161

## 2019-01-16 HISTORY — DX: Other spondylosis with myelopathy, cervical region: M47.12

## 2019-01-16 LAB — GLUCOSE, CAPILLARY: Glucose-Capillary: 203 mg/dL — ABNORMAL HIGH (ref 70–99)

## 2019-01-16 SURGERY — ANTERIOR CERVICAL DECOMPRESSION/DISCECTOMY FUSION 2 LEVELS
Anesthesia: General | Site: Neck

## 2019-01-16 MED ORDER — ACETAMINOPHEN 500 MG PO TABS
1000.0000 mg | ORAL_TABLET | Freq: Four times a day (QID) | ORAL | Status: AC
  Administered 2019-01-16 – 2019-01-17 (×3): 1000 mg via ORAL
  Filled 2019-01-16 (×3): qty 2

## 2019-01-16 MED ORDER — POTASSIUM CHLORIDE ER 10 MEQ PO TBCR
10.0000 meq | EXTENDED_RELEASE_TABLET | Freq: Two times a day (BID) | ORAL | Status: DC
  Administered 2019-01-16 – 2019-01-17 (×2): 10 meq via ORAL
  Filled 2019-01-16 (×3): qty 1

## 2019-01-16 MED ORDER — THROMBIN 5000 UNITS EX SOLR
CUTANEOUS | Status: AC
  Filled 2019-01-16: qty 5000

## 2019-01-16 MED ORDER — CHLORHEXIDINE GLUCONATE CLOTH 2 % EX PADS: 6.0000 | MEDICATED_PAD | Freq: Once | CUTANEOUS | Status: DC

## 2019-01-16 MED ORDER — ALBUTEROL SULFATE HFA 108 (90 BASE) MCG/ACT IN AERS
INHALATION_SPRAY | RESPIRATORY_TRACT | Status: AC
  Filled 2019-01-16: qty 6.7

## 2019-01-16 MED ORDER — DEXAMETHASONE SODIUM PHOSPHATE 10 MG/ML IJ SOLN
INTRAMUSCULAR | Status: DC | PRN
  Administered 2019-01-16: 10 mg via INTRAVENOUS

## 2019-01-16 MED ORDER — PROPOFOL 10 MG/ML IV BOLUS
INTRAVENOUS | Status: AC
  Filled 2019-01-16: qty 20

## 2019-01-16 MED ORDER — DIAZEPAM 5 MG PO TABS
5.0000 mg | ORAL_TABLET | Freq: Four times a day (QID) | ORAL | Status: DC | PRN
  Administered 2019-01-16: 5 mg via ORAL
  Filled 2019-01-16: qty 1

## 2019-01-16 MED ORDER — FENTANYL CITRATE (PF) 250 MCG/5ML IJ SOLN
INTRAMUSCULAR | Status: AC
  Filled 2019-01-16: qty 5

## 2019-01-16 MED ORDER — HEMOSTATIC AGENTS (NO CHARGE) OPTIME
TOPICAL | Status: DC | PRN
  Administered 2019-01-16: 1 via TOPICAL

## 2019-01-16 MED ORDER — LACTATED RINGERS IV SOLN
INTRAVENOUS | Status: DC
  Administered 2019-01-16: 13:00:00 via INTRAVENOUS

## 2019-01-16 MED ORDER — OXYCODONE HCL 5 MG PO TABS
5.0000 mg | ORAL_TABLET | ORAL | Status: DC | PRN
  Administered 2019-01-17 (×2): 5 mg via ORAL
  Filled 2019-01-16 (×2): qty 1

## 2019-01-16 MED ORDER — LIDOCAINE 2% (20 MG/ML) 5 ML SYRINGE
INTRAMUSCULAR | Status: AC
  Filled 2019-01-16: qty 5

## 2019-01-16 MED ORDER — FENTANYL CITRATE (PF) 100 MCG/2ML IJ SOLN
25.0000 ug | INTRAMUSCULAR | Status: DC | PRN
  Administered 2019-01-16: 50 ug via INTRAVENOUS

## 2019-01-16 MED ORDER — PHENYLEPHRINE HCL-NACL 10-0.9 MG/250ML-% IV SOLN
INTRAVENOUS | Status: DC | PRN
  Administered 2019-01-16: 25 ug/min via INTRAVENOUS

## 2019-01-16 MED ORDER — DEXAMETHASONE 4 MG PO TABS
4.0000 mg | ORAL_TABLET | Freq: Two times a day (BID) | ORAL | Status: DC
  Administered 2019-01-16 – 2019-01-17 (×2): 4 mg via ORAL
  Filled 2019-01-16 (×2): qty 1

## 2019-01-16 MED ORDER — ROCURONIUM BROMIDE 10 MG/ML (PF) SYRINGE
PREFILLED_SYRINGE | INTRAVENOUS | Status: AC
  Filled 2019-01-16: qty 10

## 2019-01-16 MED ORDER — ONDANSETRON HCL 4 MG/2ML IJ SOLN: 4.0000 mg | Freq: Four times a day (QID) | INTRAMUSCULAR | Status: DC | PRN

## 2019-01-16 MED ORDER — ALBUTEROL SULFATE HFA 108 (90 BASE) MCG/ACT IN AERS
INHALATION_SPRAY | RESPIRATORY_TRACT | Status: DC | PRN
  Administered 2019-01-16: 6 via RESPIRATORY_TRACT

## 2019-01-16 MED ORDER — DEXAMETHASONE SODIUM PHOSPHATE 10 MG/ML IJ SOLN
INTRAMUSCULAR | Status: AC
  Filled 2019-01-16: qty 1

## 2019-01-16 MED ORDER — ONDANSETRON HCL 4 MG/2ML IJ SOLN
INTRAMUSCULAR | Status: AC
  Filled 2019-01-16: qty 2

## 2019-01-16 MED ORDER — MIDAZOLAM HCL 2 MG/2ML IJ SOLN
INTRAMUSCULAR | Status: AC
  Filled 2019-01-16: qty 2

## 2019-01-16 MED ORDER — MENTHOL 3 MG MT LOZG
1.0000 | LOZENGE | OROMUCOSAL | Status: DC | PRN
  Filled 2019-01-16: qty 9

## 2019-01-16 MED ORDER — ONDANSETRON HCL 4 MG PO TABS: 4.0000 mg | ORAL_TABLET | Freq: Four times a day (QID) | ORAL | Status: DC | PRN

## 2019-01-16 MED ORDER — LACTATED RINGERS IV SOLN
INTRAVENOUS | Status: DC | PRN
  Administered 2019-01-16: 16:00:00 via INTRAVENOUS

## 2019-01-16 MED ORDER — SODIUM CHLORIDE 0.9% FLUSH: 3.0000 mL | INTRAVENOUS | Status: DC | PRN

## 2019-01-16 MED ORDER — SUGAMMADEX SODIUM 200 MG/2ML IV SOLN
INTRAVENOUS | Status: DC | PRN
  Administered 2019-01-16: 250 mg via INTRAVENOUS

## 2019-01-16 MED ORDER — OXYCODONE HCL ER 10 MG PO T12A
10.0000 mg | EXTENDED_RELEASE_TABLET | Freq: Two times a day (BID) | ORAL | Status: DC
  Administered 2019-01-16 – 2019-01-17 (×2): 10 mg via ORAL
  Filled 2019-01-16 (×2): qty 1

## 2019-01-16 MED ORDER — FENTANYL CITRATE (PF) 100 MCG/2ML IJ SOLN
INTRAMUSCULAR | Status: AC
  Filled 2019-01-16: qty 2

## 2019-01-16 MED ORDER — POTASSIUM CHLORIDE IN NACL 20-0.9 MEQ/L-% IV SOLN: INTRAVENOUS | Status: DC

## 2019-01-16 MED ORDER — ONDANSETRON HCL 4 MG/2ML IJ SOLN
INTRAMUSCULAR | Status: DC | PRN
  Administered 2019-01-16: 4 mg via INTRAVENOUS

## 2019-01-16 MED ORDER — METOPROLOL SUCCINATE ER 25 MG PO TB24
50.0000 mg | ORAL_TABLET | Freq: Every day | ORAL | Status: DC
  Administered 2019-01-16: 50 mg via ORAL
  Filled 2019-01-16: qty 2

## 2019-01-16 MED ORDER — THROMBIN 5000 UNITS EX SOLR
CUTANEOUS | Status: DC | PRN
  Administered 2019-01-16 (×2): 5000 [IU] via TOPICAL

## 2019-01-16 MED ORDER — ROCURONIUM BROMIDE 100 MG/10ML IV SOLN
INTRAVENOUS | Status: DC | PRN
  Administered 2019-01-16: 80 mg via INTRAVENOUS
  Administered 2019-01-16 (×2): 20 mg via INTRAVENOUS

## 2019-01-16 MED ORDER — FENTANYL CITRATE (PF) 100 MCG/2ML IJ SOLN
INTRAMUSCULAR | Status: DC | PRN
  Administered 2019-01-16 (×2): 50 ug via INTRAVENOUS
  Administered 2019-01-16: 100 ug via INTRAVENOUS
  Administered 2019-01-16 (×4): 50 ug via INTRAVENOUS

## 2019-01-16 MED ORDER — SODIUM CHLORIDE 0.9 % IV SOLN: 250.0000 mL | INTRAVENOUS | Status: DC

## 2019-01-16 MED ORDER — SODIUM CHLORIDE 0.9% FLUSH
3.0000 mL | Freq: Two times a day (BID) | INTRAVENOUS | Status: DC
  Administered 2019-01-16: 3 mL via INTRAVENOUS

## 2019-01-16 MED ORDER — ACETAMINOPHEN 500 MG PO TABS
1000.0000 mg | ORAL_TABLET | Freq: Once | ORAL | Status: AC
  Administered 2019-01-16: 1000 mg via ORAL
  Filled 2019-01-16: qty 2

## 2019-01-16 MED ORDER — LIDOCAINE-EPINEPHRINE 0.5 %-1:200000 IJ SOLN
INTRAMUSCULAR | Status: DC | PRN
  Administered 2019-01-16: 4 mL

## 2019-01-16 MED ORDER — GADOBUTROL 1 MMOL/ML IV SOLN
10.0000 mL | Freq: Once | INTRAVENOUS | Status: AC | PRN
  Administered 2019-01-16: 10 mL via INTRAVENOUS

## 2019-01-16 MED ORDER — 0.9 % SODIUM CHLORIDE (POUR BTL) OPTIME
TOPICAL | Status: DC | PRN
  Administered 2019-01-16: 18:00:00 1000 mL

## 2019-01-16 MED ORDER — PHENOL 1.4 % MT LIQD: 1.0000 | OROMUCOSAL | Status: DC | PRN

## 2019-01-16 MED ORDER — MORPHINE SULFATE (PF) 2 MG/ML IV SOLN: 2.0000 mg | INTRAVENOUS | Status: DC | PRN

## 2019-01-16 MED ORDER — LIDOCAINE 2% (20 MG/ML) 5 ML SYRINGE
INTRAMUSCULAR | Status: DC | PRN
  Administered 2019-01-16: 40 mg via INTRAVENOUS
  Administered 2019-01-16: 60 mg via INTRAVENOUS

## 2019-01-16 MED ORDER — PROPOFOL 10 MG/ML IV BOLUS
INTRAVENOUS | Status: DC | PRN
  Administered 2019-01-16: 200 mg via INTRAVENOUS

## 2019-01-16 MED ORDER — MIDAZOLAM HCL 5 MG/5ML IJ SOLN
INTRAMUSCULAR | Status: DC | PRN
  Administered 2019-01-16: 2 mg via INTRAVENOUS

## 2019-01-16 MED ORDER — AMLODIPINE BESYLATE 5 MG PO TABS
10.0000 mg | ORAL_TABLET | Freq: Every day | ORAL | Status: DC
  Administered 2019-01-16 – 2019-01-17 (×2): 10 mg via ORAL
  Filled 2019-01-16 (×2): qty 2

## 2019-01-16 MED ORDER — THROMBIN 5000 UNITS EX SOLR
OROMUCOSAL | Status: DC | PRN
  Administered 2019-01-16: 18:00:00 via TOPICAL

## 2019-01-16 MED ORDER — METOPROLOL SUCCINATE ER 50 MG PO TB24
50.0000 mg | ORAL_TABLET | Freq: Every day | ORAL | Status: DC
  Administered 2019-01-17: 50 mg via ORAL
  Filled 2019-01-16: qty 1

## 2019-01-16 SURGICAL SUPPLY — 59 items
ADH SKN CLS APL DERMABOND .7 (GAUZE/BANDAGES/DRESSINGS) ×1
BASKET BONE COLLECTION (BASKET) ×1 IMPLANT
BUR DRUM 4.0 (BURR) ×2 IMPLANT
BUR MATCHSTICK NEURO 3.0 LAGG (BURR) ×2 IMPLANT
BUR ROUND FLUTED 4 SOFT TCH (BURR) ×1 IMPLANT
BUR SABER DIAMOND 3.0 (BURR) ×1 IMPLANT
CAGE LORD STD BENGAL 16X14X34 (Cage) ×1 IMPLANT
CANISTER SUCT 3000ML PPV (MISCELLANEOUS) ×2 IMPLANT
CARTRIDGE OIL MAESTRO DRILL (MISCELLANEOUS) ×1 IMPLANT
COVER WAND RF STERILE (DRAPES) ×2 IMPLANT
DECANTER SPIKE VIAL GLASS SM (MISCELLANEOUS) ×2 IMPLANT
DERMABOND ADVANCED (GAUZE/BANDAGES/DRESSINGS) ×1
DERMABOND ADVANCED .7 DNX12 (GAUZE/BANDAGES/DRESSINGS) ×1 IMPLANT
DIFFUSER DRILL AIR PNEUMATIC (MISCELLANEOUS) ×2 IMPLANT
DRAPE HALF SHEET 40X57 (DRAPES) ×1 IMPLANT
DRAPE LAPAROTOMY 100X72 PEDS (DRAPES) ×2 IMPLANT
DRAPE MICROSCOPE LEICA (MISCELLANEOUS) ×2 IMPLANT
DURAPREP 6ML APPLICATOR 50/CS (WOUND CARE) ×2 IMPLANT
ELECT COATED BLADE 2.86 ST (ELECTRODE) ×2 IMPLANT
ELECT REM PT RETURN 9FT ADLT (ELECTROSURGICAL) ×2
ELECTRODE REM PT RTRN 9FT ADLT (ELECTROSURGICAL) ×1 IMPLANT
GAUZE 4X4 16PLY RFD (DISPOSABLE) IMPLANT
GLOVE BIO SURGEON STRL SZ 6.5 (GLOVE) ×3 IMPLANT
GLOVE BIOGEL PI IND STRL 6.5 (GLOVE) IMPLANT
GLOVE BIOGEL PI IND STRL 7.5 (GLOVE) IMPLANT
GLOVE BIOGEL PI INDICATOR 6.5 (GLOVE) ×1
GLOVE BIOGEL PI INDICATOR 7.5 (GLOVE) ×1
GLOVE ECLIPSE 6.5 STRL STRAW (GLOVE) ×2 IMPLANT
GLOVE ECLIPSE 7.0 STRL STRAW (GLOVE) ×1 IMPLANT
GLOVE EXAM NITRILE XL STR (GLOVE) IMPLANT
GOWN STRL REUS W/ TWL LRG LVL3 (GOWN DISPOSABLE) ×3 IMPLANT
GOWN STRL REUS W/ TWL XL LVL3 (GOWN DISPOSABLE) IMPLANT
GOWN STRL REUS W/TWL 2XL LVL3 (GOWN DISPOSABLE) IMPLANT
GOWN STRL REUS W/TWL LRG LVL3 (GOWN DISPOSABLE) ×6
GOWN STRL REUS W/TWL XL LVL3 (GOWN DISPOSABLE)
HEMOSTAT POWDER KIT SURGIFOAM (HEMOSTASIS) ×1 IMPLANT
KIT BASIN OR (CUSTOM PROCEDURE TRAY) ×2 IMPLANT
KIT TURNOVER KIT B (KITS) ×2 IMPLANT
NDL HYPO 25X1 1.5 SAFETY (NEEDLE) ×1 IMPLANT
NDL SPNL 22GX3.5 QUINCKE BK (NEEDLE) ×1 IMPLANT
NEEDLE HYPO 25X1 1.5 SAFETY (NEEDLE) ×2 IMPLANT
NEEDLE SPNL 22GX3.5 QUINCKE BK (NEEDLE) ×6 IMPLANT
NS IRRIG 1000ML POUR BTL (IV SOLUTION) ×2 IMPLANT
OIL CARTRIDGE MAESTRO DRILL (MISCELLANEOUS) ×2
PACK LAMINECTOMY NEURO (CUSTOM PROCEDURE TRAY) ×2 IMPLANT
PAD ARMBOARD 7.5X6 YLW CONV (MISCELLANEOUS) ×6 IMPLANT
PIN DISTRACTION 14MM (PIN) ×2 IMPLANT
PLATE AVIATOR ASSY 2LVL SZ 28 (Plate) ×1 IMPLANT
RUBBERBAND STERILE (MISCELLANEOUS) ×4 IMPLANT
SCREW AVIAT VAR SLFDRL 4X14 (Screw) ×1 IMPLANT
SCREW AVIATOR VAR SELFTAP 4X14 (Screw) ×3 IMPLANT
SPONGE INTESTINAL PEANUT (DISPOSABLE) ×4 IMPLANT
SPONGE SURGIFOAM ABS GEL SZ50 (HEMOSTASIS) ×2 IMPLANT
SUT VIC AB 0 CT1 27 (SUTURE) ×2
SUT VIC AB 0 CT1 27XBRD ANTBC (SUTURE) IMPLANT
SUT VIC AB 3-0 SH 8-18 (SUTURE) ×2 IMPLANT
TOWEL GREEN STERILE (TOWEL DISPOSABLE) ×2 IMPLANT
TOWEL GREEN STERILE FF (TOWEL DISPOSABLE) ×2 IMPLANT
WATER STERILE IRR 1000ML POUR (IV SOLUTION) ×2 IMPLANT

## 2019-01-16 NOTE — Anesthesia Procedure Notes (Signed)
Procedure Name: Intubation Date/Time: 01/16/2019 3:59 PM Performed by: Candis Shine, CRNA Pre-anesthesia Checklist: Patient identified, Emergency Drugs available, Suction available and Patient being monitored Patient Re-evaluated:Patient Re-evaluated prior to induction Oxygen Delivery Method: Circle System Utilized Preoxygenation: Pre-oxygenation with 100% oxygen Induction Type: IV induction Ventilation: Mask ventilation with difficulty, Two handed mask ventilation required and Oral airway inserted - appropriate to patient size Laryngoscope Size: Glidescope and 4 Grade View: Grade I Tube type: Oral Tube size: 7.5 mm Number of attempts: 1 Airway Equipment and Method: Oral airway,  Video-laryngoscopy and Rigid stylet Placement Confirmation: ETT inserted through vocal cords under direct vision,  positive ETCO2 and breath sounds checked- equal and bilateral Secured at: 23 cm Tube secured with: Tape Dental Injury: Teeth and Oropharynx as per pre-operative assessment  Difficulty Due To: Difficulty was anticipated

## 2019-01-16 NOTE — Op Note (Signed)
01/16/2019  7:23 PM  PATIENT:  Jorge Weaver  53 y.o. 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  BLOOD ADMINISTERED:none  CELL SAVER GIVEN:none  COUNT:per nursing  DRAINS: none   SPECIMEN:  No Specimen  DICTATION: [name] was taken to the operating room, intubated, and placed under general anesthesia without difficulty. He was positioned supine with his head in slight extension on a horseshoe headrest. The neck was prepped and draped in a sterile manner. I infiltrated 5 cc's 1/2%lidocaine/1:200,000 strength epinephrine into the planned incision starting from the midline to the medial border of the left sternocleidomastoid muscle. I opened the incision with a 10 blade and dissected sharply through soft tissue to the platysma. I dissected in the plane superior to the platysma both rostrally and caudally. I then opened the platysma in a horizontal fashion with Metzenbaum scissors, and dissected in the inferior plane rostrally and caudally. With both blunt and sharp technique I created an avascular corridor to the cervical spine. I placed a spinal needle(s) in the disc space at C5/6 . I then reflected the longus colli from C5 to C7 and placed self retaining retractors. I opened the disc space(s) at C5/6, and C6/7 with a 15 blade. I removed disc with curettes, Kerrison punches, and the drill. Using the drill I removed osteophytes and prepared for the decompression.  I decompressed the spinal canal via a corpectomy of C6. We also decompressed  the C6,and C7 root(s) with the drill, Kerrison punches, and the curettes. I used the microscope to aid in microdissection. We removed the posterior longitudinal ligament to fully expose and decompress the thecal sac. We exposed the roots laterally taking down the 5/6,6/7 uncovertebral joints. With the decompression complete we moved on to the arthrodesis. I used the drill to level the surfaces of C5, and C7. I removed soft tissue to prepare the  bony surfaces. I measured the space and placed a 19mm lordotic peek cage into the corpectomy site.  I then placed the anterior instrumentation. I placed 2 screws in each vertebral body through the plate. I locked the screws into place. Intraoperative xray showed the graft, plate, and screws to be in good position. I irrigated the wound, achieved hemostasis, and closed the wound in layers. I approximated the platysma, and the subcuticular plane with vicryl sutures. I used Dermabond for a sterile dressing.   PLAN OF CARE: Admit to inpatient   PATIENT DISPOSITION:  PACU - hemodynamically stable.   Delay start of Pharmacological VTE agent (>24hrs) due to surgical blood loss or risk of bleeding:  yes

## 2019-01-16 NOTE — H&P (Signed)
BP (!) 132/96   Pulse 85   Temp 98.2 F (36.8 C) (Oral)   Resp 20   Ht 5\' 10"  (1.778 m)   Wt 120.2 kg   SpO2 100%   BMI 38.02 kg/m  is a pressure on his bladder and a numbness that just goes down the legs.  It is absolutely worse when he stands.  He can relieve the numbness by lying down.  If he walks, he says that the numbness will eventually get better.     VITAL SIGNS :  He is 5 feet 10 inches.  He weighs 264 pounds.  Blood pressure is 145/99, pulse is 102, temperature is 96.9.  Pain is 8/10.     SOCIAL HISTORY :  He is currently working as an and has continued to work during this time of discomfort.  He is right-handed.  He does not smoke.  He does not use alcohol.  He has no history of substance abuse.     PAST MEDICAL HISTORY :  He does have hypertension.     ALLERGIES :  He has no known drug allergies.     MEDICATIONS :  He listed no medications that he is taking, but he was given a prescription on September 11 for Voltaren and a muscle relaxant secondary to the back pain that he was having.  He was sent for an MRI on 09/21.     FAMILY HISTORY :  Mother is 8 years of age.  His mother overall is in good health.  There is a possibility that she will be diagnosed with Alzheimer.  Father is deceased.  No family medical problems.     REVIEW OF SYSTEMS :  In his own words, he says for the last 3 to 4 months he is not sure what started the pain.  The pain is in the legs and back.  He said medications will help relieve it.  He says the pain is 10/10.  He feels some weakness in his back and legs, numbness and tingling in the legs.  He has had urinary incontinence on a number of occasions.  He has only had one episode of bowel incontinence.  Review of systems is positive for leg weakness and back pain.     PHYSICAL EXAMINATION :  He is alert and oriented by 4.  He answers all questions appropriately.  Memory, language, attention span, and fund of  knowledge are normal. Speech is clear, it is also fluent. Hearing intact to voice.  He has symmetric facial movements and sensation.  He has 5/5 strength in the upper and lower extremities.  2+ reflexes at the biceps, triceps, brachioradialis, knees, and ankles.  He had a negative Romberg.  He was able to tandem walk.  Difficulty with toe walking and heel walking.  Gait is antalgic.  He wobbles a bit when he walks.     IMAGING :  Jorge Weaver MRI is reviewed.  What it shows is that he has spondylolisthesis, which is actually a retrolisthesis of L5 on S1.  He has epidural lipomatosis at that level and probably at L4, also.  At L4, he certainly has some facet arthropathy and some fluid in the facets.  Lateral recess stenosis is present.  But, he has no overt compression of the canal outside of the epidural lipomatosis.  The conus medullaris is normal.  The cauda equina is otherwise normal.     ASSESSMENT AND PLAN :  I believe Jorge Weaver  needs to be followed for a number of things.  He has the unintentional weight loss.  He has not had a colonoscopy, but is scheduled to get one he believes next week. That is important.  He is unable to remember if he has had his prostate evaluated.  He needs to have a prostate exam done.  The urinary incontinence is a big deal and that goes along with diminished sexual function now for approximately a year.  He needs to have that evaluated.  He has had only one episode of bowel incontinence, but it still did occur.  There is certainly nothing on the neurological exam or on his MRI which would give a reason for either the urinary incontinence, sexual dysfunction, or the bowel problems.  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  He is again unsure if he has had his testosterone checked or if he has had a pituitary panel at all.  He has not reported any problems with his visual field.  I will give him a Medrol Dosepak to take.  I will have  him call me after he is done to let me know if it is better or worse.  We are trying to avoid any kind of surgery at all costs at this time.  If the oral steroids are not helpful, then I would certainly recommend we can try the epidural steroid injections.  This is despite the fact that he has the lipomatosis.  I will see him again in approximately 3 to 4 weeks.  He does know to contact me if there are any problems in the interim.      Jorge Weaver returns today.  He underwent an MRI of the cervical spine and thoracic spine.  What it showed is that he has a lesion in the cord just behind C6-7 where he is stenotic.  He is stenotic also at 5-6, but 6-7 is impressive and there is cord signal which is hyperintense on T2, which is tracking rostrally and caudally from the C6-7 center point.  I believe that this is the most likely reason for the gait that he has and the slight unsteadiness.  He has weak grips, but his biceps and triceps today were strong again.  I am not sure that this is responsible for the sexual dysfunction.  I explained all this to his family, but I would like to take him to the operating room quickly and that means next week.  He is going to get contrast because radiologist mentioned the possibility of a tumor.  This does not have the appearance of a tumor.

## 2019-01-16 NOTE — Transfer of Care (Signed)
Immediate Anesthesia Transfer of Care Note  Patient: Taevyn Hausen  Procedure(s) Performed: Cervical six corpectomy with Cervical five-seven fusion and plating (N/A Neck)  Patient Location: PACU  Anesthesia Type:General  Level of Consciousness: awake, alert , oriented and patient cooperative  Airway & Oxygen Therapy: Patient Spontanous Breathing and Patient connected to nasal cannula oxygen  Post-op Assessment: Report given to RN and Post -op Vital signs reviewed and stable  Post vital signs: Reviewed and stable  Last Vitals:  Vitals Value Taken Time  BP 139/101 01/16/19 1905  Temp 37 C 01/16/19 1905  Pulse 85 01/16/19 1910  Resp 17 01/16/19 1910  SpO2 98 % 01/16/19 1910  Vitals shown include unvalidated device data.  Last Pain:  Vitals:   01/16/19 1137  TempSrc:   PainSc: 8       Patients Stated Pain Goal: 4 (66/06/30 1601)  Complications: No apparent anesthesia complications

## 2019-01-16 NOTE — Anesthesia Preprocedure Evaluation (Addendum)
Anesthesia Evaluation  Patient identified by MRN, date of birth, ID band Patient awake    Reviewed: Allergy & Precautions, NPO status , Patient's Chart, lab work & pertinent test results, reviewed documented beta blocker date and time   Airway Mallampati: II  TM Distance: >3 FB Neck ROM: Full    Dental no notable dental hx. (+) Partial Upper, Dental Advisory Given   Pulmonary asthma ,    Pulmonary exam normal breath sounds clear to auscultation       Cardiovascular hypertension, Pt. on home beta blockers and Pt. on medications negative cardio ROS Normal cardiovascular exam Rhythm:Regular Rate:Normal     Neuro/Psych PSYCHIATRIC DISORDERS Anxiety negative neurological ROS     GI/Hepatic GERD  Controlled,(+)     substance abuse  alcohol use,   Endo/Other  negative endocrine ROS  Renal/GU negative Renal ROS  negative genitourinary   Musculoskeletal negative musculoskeletal ROS (+)   Abdominal   Peds  Hematology negative hematology ROS (+)   Anesthesia Other Findings   Reproductive/Obstetrics                            Anesthesia Physical Anesthesia Plan  ASA: II  Anesthesia Plan: General   Post-op Pain Management:    Induction: Intravenous  PONV Risk Score and Plan: 3 and Midazolam, Dexamethasone and Ondansetron  Airway Management Planned: Oral ETT  Additional Equipment:   Intra-op Plan:   Post-operative Plan: Extubation in OR  Informed Consent: I have reviewed the patients History and Physical, chart, labs and discussed the procedure including the risks, benefits and alternatives for the proposed anesthesia with the patient or authorized representative who has indicated his/her understanding and acceptance.     Dental advisory given  Plan Discussed with: CRNA  Anesthesia Plan Comments:         Anesthesia Quick Evaluation

## 2019-01-16 NOTE — Progress Notes (Signed)
Films reviewed. This is not a tumor, will proceed with decompression of the spinal canal and spinal cord.

## 2019-01-17 ENCOUNTER — Telehealth: Payer: Self-pay | Admitting: Family Medicine

## 2019-01-17 ENCOUNTER — Encounter (HOSPITAL_COMMUNITY): Payer: Self-pay | Admitting: Neurosurgery

## 2019-01-17 ENCOUNTER — Other Ambulatory Visit: Payer: Self-pay | Admitting: Family Medicine

## 2019-01-17 DIAGNOSIS — M4712 Other spondylosis with myelopathy, cervical region: Secondary | ICD-10-CM | POA: Diagnosis not present

## 2019-01-17 DIAGNOSIS — R32 Unspecified urinary incontinence: Secondary | ICD-10-CM

## 2019-01-17 DIAGNOSIS — N539 Unspecified male sexual dysfunction: Secondary | ICD-10-CM | POA: Diagnosis not present

## 2019-01-17 DIAGNOSIS — Z79899 Other long term (current) drug therapy: Secondary | ICD-10-CM | POA: Diagnosis not present

## 2019-01-17 DIAGNOSIS — M4722 Other spondylosis with radiculopathy, cervical region: Secondary | ICD-10-CM | POA: Diagnosis not present

## 2019-01-17 DIAGNOSIS — F419 Anxiety disorder, unspecified: Secondary | ICD-10-CM | POA: Diagnosis not present

## 2019-01-17 DIAGNOSIS — R2689 Other abnormalities of gait and mobility: Secondary | ICD-10-CM | POA: Diagnosis not present

## 2019-01-17 DIAGNOSIS — M4802 Spinal stenosis, cervical region: Secondary | ICD-10-CM | POA: Diagnosis not present

## 2019-01-17 DIAGNOSIS — I10 Essential (primary) hypertension: Secondary | ICD-10-CM | POA: Diagnosis not present

## 2019-01-17 MED ORDER — TIZANIDINE HCL 4 MG PO TABS: 4.0000 mg | ORAL_TABLET | Freq: Four times a day (QID) | ORAL | 0 refills | Status: DC | PRN

## 2019-01-17 MED ORDER — OXYCODONE HCL 5 MG PO TABS: 5.0000 mg | ORAL_TABLET | Freq: Four times a day (QID) | ORAL | 0 refills | Status: AC | PRN

## 2019-01-17 NOTE — Telephone Encounter (Signed)
Referral done

## 2019-01-17 NOTE — Telephone Encounter (Signed)
He saw neurosurgery and had a procedure, I received the note, anything in particular that was concerning? They might have wanted him to schedule a hosp follow up, but that might not be necessary if his course was unremarkable. Ty.

## 2019-01-17 NOTE — Plan of Care (Signed)
Patient alert and oriented, Jorge Weaver's well, voiding adequate amount of urine, swallowing without difficulty, c/o moderate pain and meds given prior to discharged for ride and discomfort. Patient discharged home with family. Script and discharged instructions given to patient. Patient and family stated understanding of instructions given. Patient has an appointment with Dr. Christella Noa

## 2019-01-17 NOTE — Progress Notes (Signed)
refe

## 2019-01-17 NOTE — Evaluation (Signed)
Occupational Therapy Evaluation Patient Details Name: Jorge Weaver MRN: [id number] DOB: 11/20/1965 Today's Date: 01/17/2019    History of Present Illness 53yo male s/p C6 corporectomy and C5-C7 fusion/plating. PMH HTN   Clinical Impression   Patient evaluated by Occupational Therapy with no further acute OT needs identified. All education has been completed and the patient has no further questions. See below for any follow-up Occupational Therapy or equipment needs. OT to sign off. Thank you for referral.      Follow Up Recommendations  Follow surgeon's recommendation for DC plan and follow-up therapies    Equipment Recommendations  None recommended by OT    Recommendations for Other Services       Precautions / Restrictions Precautions Precautions: Cervical Precaution Booklet Issued: Yes (comment) Precaution Comments: reviewed cervical precautions. provided handout for fine motor and theraputty Restrictions Weight Bearing Restrictions: No Other Position/Activity Restrictions: no brace      Mobility Bed Mobility Overal bed mobility: Modified Independent             General bed mobility comments: cues for rolling/cervical precautions, patient slightly impulsive  Transfers Overall transfer level: Needs assistance Equipment used: None Transfers: Sit to/from Stand Sit to Stand: Supervision         General transfer comment: observed     Balance Overall balance assessment: Mild deficits observed, not formally tested                                         ADL either performed or assessed with clinical judgement   ADL Overall ADL's : Modified independent         Cervical precautions ( handout provided): Educated patient on never to wash directly on incision site, avoid neck rotation flexion and extension, positioning with pillows in chair for bil UE, sleeping positioning, avoiding pushing / pulling with bil UE, and fine motor exercises. Pt  educated on need to notify doctor / RN of swallowing changes or choking..                                 General ADL Comments: reviewed cervical precautions in detail. pt able to figure 4 cross. wife present for entire session and able to ask questions      Vision Patient Visual Report: No change from baseline       Perception     Praxis      Pertinent Vitals/Pain Pain Assessment: No/denies pain     Hand Dominance Right   Extremity/Trunk Assessment Upper Extremity Assessment Upper Extremity Assessment: RUE deficits/detail;LUE deficits/detail RUE Deficits / Details: reports numbness 4th and 5th digit, MMT 4 out 5 RUE Sensation: decreased light touch RUE Coordination: decreased fine motor;decreased gross motor LUE Deficits / Details: reports numbness 4th and 5th digit, MMT 4 out 5, previous injury to 5th digit and wrist surgery LUE Sensation: decreased light touch LUE Coordination: decreased fine motor;decreased gross motor   Lower Extremity Assessment Lower Extremity Assessment: Defer to PT evaluation   Cervical / Trunk Assessment Cervical / Trunk Assessment: Other exceptions Cervical / Trunk Exceptions: s/p cervical surgery   Communication Communication Communication: No difficulties   Cognition Arousal/Alertness: Awake/alert Behavior During Therapy: WFL for tasks assessed/performed Overall Cognitive Status: Within Functional Limits for tasks assessed  General Comments  mild balance deficits noted, able to self correct with S    Exercises     Shoulder Instructions      Home Living Family/patient expects to be discharged to:: Private residence Living Arrangements: Spouse/significant other;Children Available Help at Discharge: Available PRN/intermittently;Family Type of Home: House Home Access: Stairs to enter [employer] of Steps: 9 Entrance Stairs-Rails: Right Home Layout: One  level     Bathroom Shower/Tub: Teacher, early years/pre: Standard     Home Equipment: Environmental consultant - 2 wheels;Cane - single point   Additional Comments: usually does not use RW or cane, only when he absolutely needed it prior to surgery. 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 has x3 grandchildren      Prior Functioning/Environment Level of Independence: Independent                 OT Problem List:        OT Treatment/Interventions:      OT Goals(Current goals can be found in the care plan section) Acute Rehab OT Goals Patient Stated Goal: go home  OT Frequency:     Barriers to D/C:            Co-evaluation              AM-PAC OT "6 Clicks" Daily Activity     Outcome Measure Help from another person eating meals?: None Help from another person taking care of personal grooming?: None Help from another person toileting, which includes using toliet, bedpan, or urinal?: None Help from another person bathing (including washing, rinsing, drying)?: None Help from another person to put on and taking off regular upper body clothing?: None Help from another person to put on and taking off regular lower body clothing?: None 6 Click Score: 24   End of Session Nurse Communication: Mobility status;Precautions  Activity Tolerance: Patient tolerated treatment well Patient left: in bed;with call bell/phone within reach;with family/visitor present  OT Visit Diagnosis: Muscle weakness (generalized) (M62.81)                Time: 6629-4765 OT Time Calculation (min): 28 min Charges:  OT General Charges $OT Visit: 1 Visit OT Evaluation $OT Eval Moderate Complexity: 1 Mod   Brynn , OTR/L  Acute Rehabilitation Services Pager: [phone] Office: [phone] .   Jeri Modena 01/17/2019, 3:02 PM

## 2019-01-17 NOTE — Telephone Encounter (Signed)
That's fine, for the incontinence? OK to place. Ty.

## 2019-01-17 NOTE — Discharge Instructions (Addendum)
Wound Care Leave incision open to air. You may shower. Do not scrub directly on incision.  Do not put any creams, lotions, or ointments on incision. Activity Walk each and every day, increasing distance each day. No lifting greater than 5 lbs.  Avoid excessive neck motion. No driving for 2 weeks; may ride as a passenger locally. Wear neck brace at all times except when showering.  If provided soft collar, may wear for comfort unless otherwise instructed. Diet Resume your normal diet.  Return to Work Will be discussed at you follow up appointment. Call Your Doctor If Any of These Occur Redness, drainage, or swelling at the wound.  Temperature greater than 101 degrees. Severe pain not relieved by pain medication. Increased difficulty swallowing. Incision starts to come apart. Follow Up Appt Call today for appointment in 4 weeks [phone]) or for problems.  If you have any hardware placed in your spine, you will need an x-ray before your appointment.      Anterior Cervical Fusion Care After Pinching of the nerves is a common cause of long-term pain. When this happens, a procedure called an anterior cervical fusion is sometimes performed. It relieves the pressure on the pinched nerve roots or spinal cord in the neck. An anterior cervical fusion means that the operation is done through the front (anterior) of your neck to fuse bones in your neck together. This procedure is done to relieve the pressure on pinched nerve roots or spinal cord. This operation is done to control the movement of your spine, which may be pressing on the nerves. This may relieve the pain. The procedure that stops the movement of the spine is called a fusion. The cut by the surgeon (incision) is usually within a skin fold line under your chin. After moving the neck muscles gently apart, the neurosurgeon uses an operating microscope and removes the injured intervertebral disk (the cushion or pad of tissue between the  bones of the spine). This takes the pressure off the nerves or spinal cord. This is called decompression. The area where the disc was removed is then filled with a bone graft. The graft will fuse the vertebrae together over time. This means it causes the vertebral bodies to grow together. The bone graft may be obtained from your own bone (your hip for example), or may be obtained from a bone bank. Receiving bone from a bone bank is similar to a blood bank, only the bone comes from human donors who have recently died. This type of graft is referred to as allograft bone. The preformed bone plug is safe and will not be rejected by your body. It does not contain blood cells. In some cases, the surgeon may use hardware in your neck to help stabilize it. This means that metal plates or pins or screws may be used to:  Provide extra support to the neck.   Help the bones to grow together more easily.  A cervical fusion procedure takes a couple hours to several hours, depending on what needs to be done. Your caregiver will be able to answer your questions for you. HOME CARE INSTRUCTIONS   It will be normal to have a sore throat and have difficulty swallowing foods for a couple weeks following surgery. See your caregiver if this seems to be getting worse rather than better.   You may resume normal diet and activities as directed or allowed. Generally, walking and stair climbing are fine. Avoid lifting more than ten pounds and do   no lifting above your head.   If given a cervical collar, remove only for bathing and eating, or as directed.   Use only showers for cleaning up, with no bathing, until seen.   You may apply ice to the surgical or bone donor site for 15 to 20 minutes each hour while awake for the first couple days following surgery. Put the ice in a plastic bag and place a towel between the bag of ice and your skin.   Change dressings if necessary or as directed.   You may drive in 10 days   Take  prescribed medication as directed. Only take over-the-counter or prescription medicines for pain, discomfort, or fever as directed by your caregiver.   Make an appointment to see your caregiver for suture or staple removal when instructed.   If physical therapy was prescribed, follow your caregiver's directions.  SEEK IMMEDIATE MEDICAL CARE IF:  There is redness, swelling, or increasing pain in the wound.   There is pus coming from the wound.   An unexplained oral temperature over 102 F (38.9 C) develops.   There is a bad smell coming from the wound or dressing.   You have swelling in your calf or leg.   You develop shortness of breath or chest pain.   The wound edges break open after sutures or staples have been removed.   Your pain is not controlled with medicine.   You seem to be getting worse rather than better.  Document Released: 10/19/2003 Document Revised: 11/16/2010 Document Reviewed: 12/25/2007 [employer] Patient Information 2012 [employer]. 

## 2019-01-17 NOTE — Discharge Summary (Signed)
Physician Discharge Summary  Patient ID: Jorge Weaver MRN: [id number] DOB/AGE: 04-07-1965 53 y.o.  Admit date: 01/16/2019 Discharge date: 01/17/2019  Admission Diagnoses:cervical spondylosis with myelopathy C5/6,6/7  Discharge Diagnoses:  Active Problems:   Cervical spondylosis with myelopathy and radiculopathy   Discharged Condition: good  Hospital Course: Mr. Boughner  Treatments: surgery: Cervical 6 corpectomy and C5-7 arthrodesis with peek cage and aviator plating  Discharge Exam: Blood pressure 115/84, pulse 62, temperature 98.1 F (36.7 C), temperature source Oral, resp. rate 18, height 5\' 10"  (1.778 m), weight 120.2 kg, SpO2 99 %. General appearance: alert, cooperative, appears stated age and no distress Neurologic: Grossly normal  Disposition: Discharge disposition: 01-Home or Self Care      Cervical spondylosis with myelopathy  Allergies as of 01/17/2019   No Known Allergies     Medication List    STOP taking these medications   diclofenac 75 MG EC tablet Commonly known as: VOLTAREN     TAKE these medications   amLODipine 10 MG tablet Commonly known as: NORVASC Take 1 tablet (10 mg total) by mouth daily.   metoprolol succinate 50 MG 24 hr tablet Commonly known as: TOPROL-XL Take 1 tablet (50 mg total) by mouth daily. Take with or immediately following a meal.   oxyCODONE 5 MG immediate release tablet Commonly known as: Oxy IR/ROXICODONE Take 1 tablet (5 mg total) by mouth every 6 (six) hours as needed for up to 7 days for moderate pain ((score 4 to 6)).   potassium chloride 10 MEQ tablet Commonly known as: Klor-Con 10 Take 2 tablets (20 mEq total) by mouth daily. What changed:   how much to take  when to take this   tiZANidine 4 MG tablet Commonly known as: ZANAFLEX Take 1 tablet (4 mg total) by mouth every 6 (six) hours as needed for muscle spasms.      Follow-up Information    Ashok Pall, MD Follow up in 3 week(s).   Specialty:  Neurosurgery Why: please call to make an appointment Contact information: [address] [place] [address] [phone]           Signed: Ashok Pall 01/17/2019, 2:11 PM

## 2019-01-17 NOTE — Evaluation (Signed)
Physical Therapy Evaluation Patient Details Name: Jorge Weaver MRN: [id number] DOB: 1966/01/10 Today's Date: 01/17/2019   History of Present Illness  53yo male s/p C6 corporectomy and C5-C7 fusion/plating. PMH HTN  Clinical Impression  Patient received in bed, reports he has been up with nursing and has no concerns regarding mobility  but willing to participate in PT session. Able to complete bed mobility with Mod(I), functional transfers with S and no device, and gait approximately 550f with no device, politely declines stair practice and reports he has little concern he will have problems with this. Mild ataxia and reduced proprioception noted B LEs, he reports this is better than it was before surgery. Intermittent VC during session to maintain cervical precautions. Mild impulsivity noted but this is likely due to eagerness to discharge home. He was left in bed with all needs met, RN aware of patient status and PT recommendations. PT signing off for now- if he has urgent PT needs prior to DC, please reorder therapy. Thank you for the referral.     Follow Up Recommendations Outpatient PT;Other (comment)(when cleared by surgeon)    Equipment Recommendations  None recommended by PT    Recommendations for Other Services       Precautions / Restrictions Precautions Precautions: Cervical Precaution Booklet Issued: Yes (comment) Precaution Comments: provided handout, patient states he already has this and declines review Restrictions Weight Bearing Restrictions: No Other Position/Activity Restrictions: no brace needed per order set      Mobility  Bed Mobility Overal bed mobility: Modified Independent             General bed mobility comments: cues for rolling/cervical precautions, patient slightly impulsive  Transfers Overall transfer level: Needs assistance Equipment used: None Transfers: Sit to/from Stand Sit to Stand: Supervision         General transfer comment: S  for safety, no physical assist given  Ambulation/Gait Ambulation/Gait assistance: Supervision Gait Distance (Feet): 500 Feet Assistive device: None Gait Pattern/deviations: Step-through pattern;Ataxic Gait velocity: WNL   General Gait Details: noted mild ataxia/reduced proprioception in BLEs, improved with extended gait and patient reports this is better compared to before surgery  Stairs            Wheelchair Mobility    Modified Rankin (Stroke Patients Only)       Balance Overall balance assessment: Mild deficits observed, not formally tested                                           Pertinent Vitals/Pain Pain Assessment: No/denies pain    Home Living Family/patient expects to be discharged to:: Private residence Living Arrangements: Spouse/significant other;Children Available Help at Discharge: Available PRN/intermittently;Family Type of Home: House Home Access: Stairs to enter Entrance Stairs-Rails: Right Entrance Stairs-Number of Steps: 9 Home Layout: One level Home Equipment: Walker - 2 wheels;Cane - single point Additional Comments: usually does not use RW or cane, only when he absolutely needed it prior to surgery. 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    Prior Function Level of Independence: Independent               HJournalist, newspaper       Extremity/Trunk Assessment   Upper Extremity Assessment Upper Extremity Assessment: Defer to OT evaluation    Lower Extremity Assessment Lower Extremity Assessment: Overall WFL for tasks assessed  Cervical / Trunk Assessment Cervical / Trunk Assessment: Other exceptions Cervical / Trunk Exceptions: s/p cervical surgery  Communication   Communication: No difficulties  Cognition Arousal/Alertness: Awake/alert Behavior During Therapy: WFL for tasks assessed/performed Overall Cognitive Status: Within Functional Limits for tasks assessed                                         General Comments General comments (skin integrity, edema, etc.): mild balance deficits noted, able to self correct with S    Exercises     Assessment/Plan    PT Assessment All further PT needs can be met in the next venue of care  PT Problem List Obesity;Decreased safety awareness;Pain;Decreased balance;Impaired sensation       PT Treatment Interventions      PT Goals (Current goals can be found in the Care Plan section)  Acute Rehab PT Goals Patient Stated Goal: go home PT Goal Formulation: With patient Time For Goal Achievement: 01/31/19 Potential to Achieve Goals: Good    Frequency     Barriers to discharge        Co-evaluation               AM-PAC PT "6 Clicks" Mobility  Outcome Measure Help needed turning from your back to your side while in a flat bed without using bedrails?: None Help needed moving from lying on your back to sitting on the side of a flat bed without using bedrails?: None Help needed moving to and from a bed to a chair (including a wheelchair)?: None Help needed standing up from a chair using your arms (e.g., wheelchair or bedside chair)?: None Help needed to walk in hospital room?: A Little Help needed climbing 3-5 steps with a railing? : A Little 6 Click Score: 22    End of Session   Activity Tolerance: Patient tolerated treatment well Patient left: in bed;with call bell/phone within reach Nurse Communication: Mobility status PT Visit Diagnosis: Ataxic gait (R26.0);Other abnormalities of gait and mobility (R26.89)    Time: 4712-5271 PT Time Calculation (min) (ACUTE ONLY): 10 min   Charges:   PT Evaluation $PT Eval Low Complexity: 1 Low          Windell Norfolk, DPT, CBIS  Supplemental Physical Therapist [place]    Pager [phone] Acute Rehab Office [phone]

## 2019-01-17 NOTE — Telephone Encounter (Signed)
Copied from [place] [id number]. Topic: Referral - Request for Referral >> Jan 17, 2019 12:42 PM Alease Frame wrote: Has patient seen PCP for this complaint? Yes patient has had surgery and was asked to reach out Referral for which specialty: eurologist  Reason for referral: patient has had surgery and was asked to reach out

## 2019-01-17 NOTE — Anesthesia Postprocedure Evaluation (Signed)
Anesthesia Post Note  Patient: Jorge Weaver  Procedure(s) Performed: Cervical six corpectomy with Cervical five-seven fusion and plating (N/A Neck)     Patient location during evaluation: PACU Anesthesia Type: General Level of consciousness: awake and alert Pain management: pain level controlled Vital Signs Assessment: post-procedure vital signs reviewed and stable Respiratory status: spontaneous breathing, nonlabored ventilation, respiratory function stable and patient connected to nasal cannula oxygen Cardiovascular status: blood pressure returned to baseline and stable Postop Assessment: no apparent nausea or vomiting Anesthetic complications: no    Last Vitals:  Vitals:   01/17/19 0349 01/17/19 0759  BP: 126/88 126/88  Pulse: 64 71  Resp: 20 19  Temp: 36.5 C 36.6 C  SpO2: 97% 98%    Last Pain:  Vitals:   01/17/19 0759  TempSrc: Oral  PainSc:                  Effie Berkshire

## 2019-01-17 NOTE — Telephone Encounter (Signed)
Called and spoke to his wife and they need a urologist referral

## 2019-01-29 ENCOUNTER — Telehealth: Payer: Self-pay | Admitting: Family Medicine

## 2019-01-29 NOTE — Telephone Encounter (Signed)
Pt has continued to lose control of his urine and his neurosurgeon did not believe it was related to his low back, thus recommending referral to the urology team. Ty.

## 2019-01-29 NOTE — Telephone Encounter (Signed)
Copied from [place] [id number]. Topic: General - Other >> Jan 29, 2019 11:45 AM Carolyn Stare wrote: Bethena Roys with [facility] call to say they need real office notes  and labs, need notes that say why the pt is coming to the office, there are no notes backing up pt  diagnosis, papges that were sent was not what the doctor needed

## 2019-01-29 NOTE — Telephone Encounter (Signed)
The neurosurgery team did send something over. I believe they did end up performing a procedure. We can hold off for now unless he continues to have issues following the procedure. Ty.

## 2019-01-29 NOTE — Telephone Encounter (Signed)
ok 

## 2019-01-29 NOTE — Telephone Encounter (Signed)
So would those notes be from that office?

## 2019-02-04 ENCOUNTER — Ambulatory Visit: Payer: BC Managed Care – PPO | Admitting: Family Medicine

## 2019-02-06 DIAGNOSIS — R3911 Hesitancy of micturition: Secondary | ICD-10-CM | POA: Diagnosis not present

## 2019-02-06 DIAGNOSIS — M4712 Other spondylosis with myelopathy, cervical region: Secondary | ICD-10-CM | POA: Diagnosis not present

## 2019-02-06 DIAGNOSIS — N5201 Erectile dysfunction due to arterial insufficiency: Secondary | ICD-10-CM | POA: Diagnosis not present

## 2019-03-19 ENCOUNTER — Ambulatory Visit (INDEPENDENT_AMBULATORY_CARE_PROVIDER_SITE_OTHER): Payer: BC Managed Care – PPO | Admitting: Physical Therapy

## 2019-03-19 ENCOUNTER — Other Ambulatory Visit: Payer: Self-pay

## 2019-03-19 ENCOUNTER — Encounter: Payer: Self-pay | Admitting: Physical Therapy

## 2019-03-19 DIAGNOSIS — M5441 Lumbago with sciatica, right side: Secondary | ICD-10-CM

## 2019-03-19 DIAGNOSIS — R262 Difficulty in walking, not elsewhere classified: Secondary | ICD-10-CM

## 2019-03-19 DIAGNOSIS — M542 Cervicalgia: Secondary | ICD-10-CM | POA: Diagnosis not present

## 2019-03-19 DIAGNOSIS — M6281 Muscle weakness (generalized): Secondary | ICD-10-CM

## 2019-03-19 DIAGNOSIS — R2689 Other abnormalities of gait and mobility: Secondary | ICD-10-CM | POA: Diagnosis not present

## 2019-03-19 DIAGNOSIS — G8929 Other chronic pain: Secondary | ICD-10-CM

## 2019-03-19 DIAGNOSIS — M5442 Lumbago with sciatica, left side: Secondary | ICD-10-CM | POA: Diagnosis not present

## 2019-03-19 NOTE — Therapy (Signed)
[facility] [place] [address] [place], [place], [address] Phone: [phone]   Fax:  [phone]  Physical Therapy Evaluation  Patient Details  Name: Jorge GladJoe Hainsworth Jr. MRN: [id number] Date of Birth: 25-Oct-1965 Referring Provider (PT): Coletta Memosabbell, Kyle MD   Encounter Date: 03/19/2019  PT End of Session - 03/19/19 1316    Visit Number  1    Number of Visits  13    Date for PT Re-Evaluation  05/14/19    PT Start Time  1015    PT Stop Time  1102    PT Time Calculation (min)  47 min    Activity Tolerance  Patient tolerated treatment well    Behavior During Therapy  [facility] for tasks assessed/performed       Past Medical History:  Diagnosis Date  . Anemia    low iron  . Asthma    as a child  . Cervical spondylosis with myelopathy   . Fatty liver   . GERD (gastroesophageal reflux disease)   . Heart murmur    when he was younger   . Hypertension   . Pneumonia     Past Surgical History:  Procedure Laterality Date  . ANTERIOR CERVICAL DECOMP/DISCECTOMY FUSION N/A 01/16/2019   Procedure: Cervical six corpectomy with Cervical five-seven fusion and plating;  Surgeon: Coletta Memosabbell, Kyle, MD;  [facility];  Service: Neurosurgery;  Laterality: N/A;  . NO PAST SURGERIES    . WISDOM TOOTH EXTRACTION    . WRIST SURGERY Left 2014   fx    There were no vitals filed for this visit.   Subjective Assessment - 03/19/19 1016    Subjective  Pt arriving to therapy reporting cervical pain and low back pain. Pt reporting his LBP is worse than his neck pain. Pt s/p ACDF10/29/2020. pt reporting before surgery he was having multiple falls. Pt reporting 1 fall since his surgery. Pt also reporting numbness down bilateral LE's which extends into pt's bilateral feet.    Pertinent History  s/p ACDF 01/16/2019, L wrist surgery, anemia, asthmas, GERD, heart murmur, HTN, penumonia, fatty liver. cervical myelopathy    Limitations   Sitting;Lifting;Standing;Walking;House hold activities    How long can you walk comfortably?  10 minutes    Patient Stated Goals  Stop hurting    Currently in Pain?  Yes    Pain Score  5    pt reported taking pain meds prior to therapy   Pain Location  Back    Pain Orientation  Lower    Pain Descriptors / Indicators  Spasm;Pressure    Pain Type  Acute pain    Pain Radiating Towards  down legs to bilateral feet    Pain Onset  More than a month ago    Pain Frequency  Intermittent    Aggravating Factors   standing prolonged    Pain Relieving Factors  changing positions    Effect of Pain on Daily Activities  difficulty with getting in/out of bed, difficulties with dressing and bathing, decreased balance.         [facility] - 03/19/19 0001      Assessment   Medical Diagnosis  cervicalgia, M47.12, low back pain    Referring Provider (PT)  Coletta Memosabbell, Kyle MD    Hand Dominance  Right    Next MD Visit  follow up after therapy    Prior Therapy  no      Precautions   Precautions  None  Restrictions   Weight Bearing Restrictions  No      Balance Screen   Has the patient fallen in the past 6 months  Yes    How many times?  5   one fall since 01/16/2019 following ACDF surgery   Has the patient had a decrease in activity level because of a fear of falling?   Yes    Is the patient reluctant to leave their home because of a fear of falling?   No      Home Environment   Living Environment  Private residence    Living Arrangements  Spouse/significant other;Children    Home Access  Level entry    Home Layout  Multi-level    Alternate Level Stairs-Number of Steps  10 steps    Alternate Level Stairs-Rails  None    Home Equipment  None      Prior Function   Level of Independence  Independent    Vocation  Full time employment    Therapist, music    Leisure  fish      Cognition   Overall Cognitive Status  Within Functional Limits for tasks  assessed      Observation/Other Assessments   Focus on Therapeutic Outcomes (FOTO)    52 % limitiation      Posture/Postural Control   Posture/Postural Control  Postural limitations    Postural Limitations  Rounded Shoulders;Forward head;Increased lumbar lordosis      ROM / Strength   AROM / PROM / Strength  AROM;Strength      AROM   AROM Assessment Site  Cervical;Lumbar    Cervical Flexion  12    Cervical Extension  25    Cervical - Right Side Bend  24    Cervical - Left Side Bend  22    Cervical - Right Rotation  55    Cervical - Left Rotation  55    Lumbar Flexion  65     Lumbar Extension  18     Lumbar - Right Side Bend  22   increased pain   Lumbar - Left Side Bend  20   increased pain   Lumbar - Right Rotation  50% limitation   limited by pain   Lumbar - Left Rotation  50% limitation   limited by pain     Strength   Strength Assessment Site  Hip;Knee    Right/Left Hip  Right;Left    Right Hip Flexion  3+/5    Right Hip ABduction  3+/5    Right Hip ADduction  3+/5    Left Hip Flexion  3+/5    Left Hip ABduction  3+/5    Left Hip ADduction  3+/5      Flexibility   Soft Tissue Assessment /Muscle Length  yes    Hamstrings  measured with opposite knee bent: R : 70 degrees, L: 60 degrees      Palpation   Palpation comment  tender to palpation on bilateral lumbar paraspinals and iliac crest, pt reporting numbness into bilatereal gluteals      Transfers   Five time sit to stand comments   32 seconds with UE support      Ambulation/Gait   Assistive device  None    Gait Pattern  Step-through pattern;Decreased step length - right;Decreased step length - left;Decreased stride length;Decreased dorsiflexion - right;Decreased dorsiflexion - left;Antalgic;Trunk rotated posteriorly on left;Wide base of support;Poor foot clearance - left;Poor foot clearance - right  Ambulation Surface  Level;Indoor      High Level Balance   High Level Balance Comments  Pt unable to  perform SLR bilaterally                Objective measurements completed on examination: See above findings.              PT Education - 03/19/19 1315    Education Details  PT POC, HEP, postural edu, basic anatomy    Person(s) Educated  Patient    Methods  Explanation;Demonstration;Other (comment)    Comprehension  Verbalized understanding;Returned demonstration;Need further instruction          PT Long Term Goals - 03/19/19 1339      PT LONG TERM GOAL #1   Title  Pt will be independent in his HEP and progression.    Time  6    Period  Weeks    Status  New    Target Date  04/30/19      PT LONG TERM GOAL #2   Title  Pt will be able to perform 5 time sit to stand </= 20 seconds using UE support as needed.    Baseline  32 seconds using UE support    Time  6    Period  Weeks    Status  New    Target Date  04/30/19      PT LONG TERM GOAL #3   Title  Pt will improve his bilateral LE strength to >/= 4+/5 in order to improve functional mobility.    Baseline  see flow sheets    Time  6    Period  Weeks    Status  New    Target Date  04/30/19      PT LONG TERM GOAL #4   Title  Pt will be able to donne his clothes with no pain reported without assistance.    Baseline  requires assistance due to pain    Time  6    Period  Weeks    Status  New    Target Date  04/30/19      PT LONG TERM GOAL #5   Title  Assess BERG and establish balance goal    Baseline  unable to perform a SLS on bilateral LE's    Time  6    Period  Weeks    Status  New             Plan - 03/19/19 1058    Clinical Impression Statement  Pt arriving to therapy reporting cervical pain and low back pain. Pt reporting his LBP (5/10) is worse than his neck pain. Pt s/p ACDF on 01/16/2019. Pt reporting before surgery he was having multiple falls. Pt reporting 1 fall since his surgery. Pt presenting into clinic with antalgic gait pattern using no device. Pt with wide BOS and decreased  foot clearance bilaterally. Pt also reporting numbness down bilateral LE's which extends into pt's bilateral feet. Pt with limited LE strength in bilateral hips with balance deficit noted. Pt with inability to perform SLS on bilateral LE's. Recomending BERG to be performed at next visit. Pt was issued a HEP. Skilled PT needed to progress pt toward LTG's set with the below interventions.    Personal Factors and Comorbidities  Comorbidity 3+;Fitness    Comorbidities  anemia, asthma, heart murmur, HTN, pneumonia, cervical myelopathy, ACDF 01/16/2019, L wrist surgery    Examination-Activity Limitations  Bathing;Dressing;Lift;Reach Overhead;Squat;Stand;Transfers    Examination-Participation  Restrictions  Community Activity;Driving;Other    Stability/Clinical Decision Making  Evolving/Moderate complexity    Clinical Decision Making  Moderate    Rehab Potential  Fair    PT Frequency  2x / week    PT Duration  6 weeks    PT Treatment/Interventions  ADLs/Self Care Home Management;Cryotherapy;Moist Heat;Dry needling;Electrical Stimulation;Iontophoresis /ml Dexamethasone;Traction;Ultrasound;Manual techniques;Passive range of motion;Taping;Functional mobility training;Stair training;Gait training;Therapeutic activities;Therapeutic exercise;Balance training;Neuromuscular re-education;Patient/family education    PT Next Visit Plan  Perform BERG, Nustep, LE strengthening, stretching, trunk mobility, STM and modalities as needed.    PT Home Exercise Plan  [id number] (PPT, hamstring streteches, hip abduction with green band)    Consulted and Agree with Plan of Care  Patient       Patient will benefit from skilled therapeutic intervention in order to improve the following deficits and impairments:  Pain, Postural dysfunction, Decreased strength, Decreased activity tolerance, Obesity, Decreased range of motion, Difficulty walking, Decreased mobility, Decreased balance  Visit Diagnosis: Chronic bilateral low back  pain with bilateral sciatica  Cervicalgia  Difficulty in walking, not elsewhere classified  Other abnormalities of gait and mobility  Muscle weakness (generalized)     Problem List Patient Active Problem List   Diagnosis Date Noted  . Cervical spondylosis with myelopathy and radiculopathy 01/16/2019  . Spinal stenosis of lumbar region 12/24/2018  . Gastroesophageal reflux disease 06/26/2018  . Essential hypertension 06/17/2018  . Fatty liver   . Witnessed apneic spells 03/28/2017  . Prediabetes 01/18/2017  . Bronchitis 05/21/2014  . ETOH abuse 05/21/2014  . Asthma 01/15/2013  . Asthma with exacerbation 01/15/2013  . Generalized anxiety disorder 08/24/2012  . Malaise and fatigue 08/24/2012    Sharmon Leyden, PT 03/19/2019, 1:59 PM  [facility] [address] [place], [place], [address] Phone: [phone]   Fax:  [phone]  Name: Jorge Trella. MRN: [id number] Date of Birth: [birth year]

## 2019-03-26 ENCOUNTER — Encounter: Payer: BC Managed Care – PPO | Admitting: Physical Therapy

## 2019-03-28 ENCOUNTER — Encounter: Payer: BC Managed Care – PPO | Admitting: Physical Therapy

## 2019-04-02 ENCOUNTER — Encounter: Payer: Self-pay | Admitting: Physical Therapy

## 2019-04-02 ENCOUNTER — Ambulatory Visit: Payer: BC Managed Care – PPO | Admitting: Physical Therapy

## 2019-04-02 ENCOUNTER — Other Ambulatory Visit: Payer: Self-pay

## 2019-04-02 DIAGNOSIS — R262 Difficulty in walking, not elsewhere classified: Secondary | ICD-10-CM | POA: Diagnosis not present

## 2019-04-02 DIAGNOSIS — R2689 Other abnormalities of gait and mobility: Secondary | ICD-10-CM

## 2019-04-02 DIAGNOSIS — M542 Cervicalgia: Secondary | ICD-10-CM | POA: Diagnosis not present

## 2019-04-02 DIAGNOSIS — G8929 Other chronic pain: Secondary | ICD-10-CM

## 2019-04-02 DIAGNOSIS — M5441 Lumbago with sciatica, right side: Secondary | ICD-10-CM

## 2019-04-02 DIAGNOSIS — M5442 Lumbago with sciatica, left side: Secondary | ICD-10-CM | POA: Diagnosis not present

## 2019-04-02 DIAGNOSIS — M6281 Muscle weakness (generalized): Secondary | ICD-10-CM

## 2019-04-02 NOTE — Therapy (Signed)
[place], [place], [address] Phone: [phone]   Fax:  [phone]  Physical Therapy Treatment  Patient Details  Name: Jorge Weaver. MRN: [id number] Date of Birth: 1966-02-11 Referring Provider (PT): Ashok Pall MD   Encounter Date: 04/02/2019  PT End of Session - 04/02/19 1023    Visit Number  2    Number of Visits  13    Date for PT Re-Evaluation  05/14/19    PT Start Time  1022    PT Stop Time  1103    PT Time Calculation (min)  41 min    Activity Tolerance  Patient tolerated treatment well    Behavior During Therapy  [facility] for tasks assessed/performed       Past Medical History:  Diagnosis Date  . Anemia    low iron  . Asthma    as a child  . Cervical spondylosis with myelopathy   . Fatty liver   . GERD (gastroesophageal reflux disease)   . Heart murmur    when he was younger   . Hypertension   . Pneumonia     Past Surgical History:  Procedure Laterality Date  . ANTERIOR CERVICAL DECOMP/DISCECTOMY FUSION N/A 01/16/2019   Procedure: Cervical six corpectomy with Cervical five-seven fusion and plating;  Surgeon: Ashok Pall, MD;  Location: [place];  Service: Neurosurgery;  Laterality: N/A;  . NO PAST SURGERIES    . WISDOM TOOTH EXTRACTION    . WRIST SURGERY Left 2014   fx    There were no vitals filed for this visit.  Subjective Assessment - 04/02/19 1024    Subjective  Patient reporting stiffness today.    Pertinent History  s/p ACDF 01/16/2019, L wrist surgery, anemia, asthmas, GERD, heart murmur, HTN, penumonia, fatty liver. cervical myelopathy    Limitations  Sitting;Lifting;Standing;Walking;House hold activities    Patient Stated Goals  Stop hurting    Currently in Pain?  Yes    Pain Score  4     Pain Location  Back    Pain Orientation  Lower    Pain Descriptors / Indicators  Spasm;Pressure         [facility] PT Assessment - 04/02/19 0001      Standardized Balance  Assessment   Standardized Balance Assessment  Berg Balance Test;Timed Up and Go Test      Berg Balance Test   Sit to Stand  Able to stand without using hands and stabilize independently    Standing Unsupported  Able to stand safely 2 minutes    Sitting with Back Unsupported but Feet Supported on Floor or Stool  Able to sit safely and securely 2 minutes    Stand to Sit  Sits safely with minimal use of hands    Transfers  Able to transfer safely, minor use of hands    Standing Unsupported with Eyes Closed  Able to stand 10 seconds safely    Standing Unsupported with Feet Together  Able to place feet together independently and stand 1 minute safely    From Standing, Reach Forward with Outstretched Arm  Can reach confidently >25 cm (10")    From Standing Position, Pick up Object from Floor  Able to pick up shoe safely and easily    From Standing Position, Turn to Look Behind Over each Shoulder  Looks behind one side only/other side shows less weight shift    Turn 360 Degrees  Able to turn 360 degrees safely  in 4 seconds or less    Standing Unsupported, Alternately Place Feet on Step/Stool  Able to stand independently and safely and complete 8 steps in 20 seconds    Standing Unsupported, One Foot in Front  Able to plae foot ahead of the other independently and hold 30 seconds    Standing on One Leg  Tries to lift leg/unable to hold 3 seconds but remains standing independently    Total Score  51      Timed Up and Go Test   Normal TUG (seconds)  13                   [facility] Adult PT Treatment/Exercise - 04/02/19 0001      Exercises   Exercises  Lumbar      Lumbar Exercises: Stretches   Active Hamstring Stretch  Right;Left;1 rep;30 seconds    Active Hamstring Stretch Limitations  seated with leg on stool       Lumbar Exercises: Aerobic   Nustep  L5 X 5 MIN      Lumbar Exercises: Standing   Other Standing Lumbar Exercises  SLS at TM using 2 finger support 2 reps max hold bil     Other Standing Lumbar Exercises  step ups 6 inch fwd and lateral x 10 each bil      Lumbar Exercises: Seated   Sit to Stand  10 reps      Lumbar Exercises: Supine   Pelvic Tilt  10 reps   3 SEC   Clam  10 reps    Bridge  20 reps;Non-compliant                  PT Long Term Goals - 03/19/19 1339      PT LONG TERM GOAL #1   Title  Pt will be independent in his HEP and progression.    Time  6    Period  Weeks    Status  New    Target Date  04/30/19      PT LONG TERM GOAL #2   Title  Pt will be able to perform 5 time sit to stand </= 20 seconds using UE support as needed.    Baseline  32 seconds using UE support    Time  6    Period  Weeks    Status  New    Target Date  04/30/19      PT LONG TERM GOAL #3   Title  Pt will improve his bilateral LE strength to >/= 4+/5 in order to improve functional mobility.    Baseline  see flow sheets    Time  6    Period  Weeks    Status  New    Target Date  04/30/19      PT LONG TERM GOAL #4   Title  Pt will be able to donne his clothes with no pain reported without assistance.    Baseline  requires assistance due to pain    Time  6    Period  Weeks    Status  New    Target Date  04/30/19      PT LONG TERM GOAL #5   Title  Assess BERG and establish balance goal    Baseline  unable to perform a SLS on bilateral LE's    Time  6    Period  Weeks    Status  New  Plan - 04/02/19 1107    Clinical Impression Statement  Patient did well today with TE. We completed the BERG and he mainly has difficulty with high level balance activities. TUG was also completed with score of 13 sec. He will benefit from core and SLS activities.    Comorbidities  anemia, asthma, heart murmur, HTN, pneumonia, cervical myelopathy, ACDF 01/16/2019, L wrist surgery    PT Frequency  2x / week    PT Duration  6 weeks    PT Treatment/Interventions  ADLs/Self Care Home Management;Cryotherapy;Moist Heat;Dry needling;Electrical  Stimulation;Iontophoresis 4mg /ml Dexamethasone;Traction;Ultrasound;Manual techniques;Passive range of motion;Taping;Functional mobility training;Stair training;Gait training;Therapeutic activities;Therapeutic exercise;Balance training;Neuromuscular re-education;Patient/family education    PT Next Visit Plan  Nustep, LE strengthening progressing to functional activities needed for work, stretching, trunk mobility, STM and modalities as needed.    PT Home Exercise Plan  [email or web address]       Patient will benefit from skilled therapeutic intervention in order to improve the following deficits and impairments:  Pain, Postural dysfunction, Decreased strength, Decreased activity tolerance, Obesity, Decreased range of motion, Difficulty walking, Decreased mobility, Decreased balance  Visit Diagnosis: Chronic bilateral low back pain with bilateral sciatica  Cervicalgia  Difficulty in walking, not elsewhere classified  Other abnormalities of gait and mobility  Muscle weakness (generalized)     Problem List Patient Active Problem List   Diagnosis Date Noted  . Cervical spondylosis with myelopathy and radiculopathy 01/16/2019  . Spinal stenosis of lumbar region 12/24/2018  . Gastroesophageal reflux disease 06/26/2018  . Essential hypertension 06/17/2018  . Fatty liver   . Witnessed apneic spells 03/28/2017  . Prediabetes 01/18/2017  . Bronchitis 05/21/2014  . ETOH abuse 05/21/2014  . Asthma 01/15/2013  . Asthma with exacerbation 01/15/2013  . Generalized anxiety disorder 08/24/2012  . Malaise and fatigue 08/24/2012    10/24/2012 PT 04/02/2019, 11:15 AM  [facility] [address] [place], [place], [place] Phone: [phone]   Fax:  [phone]  Name: Jorge Weaver. MRN: Santiago Glad Date of Birth: 03/16/1966

## 2019-04-09 ENCOUNTER — Other Ambulatory Visit: Payer: Self-pay

## 2019-04-09 ENCOUNTER — Ambulatory Visit: Payer: BC Managed Care – PPO | Admitting: Physical Therapy

## 2019-04-09 ENCOUNTER — Encounter: Payer: Self-pay | Admitting: Physical Therapy

## 2019-04-09 DIAGNOSIS — G8929 Other chronic pain: Secondary | ICD-10-CM

## 2019-04-09 DIAGNOSIS — M5442 Lumbago with sciatica, left side: Secondary | ICD-10-CM | POA: Diagnosis not present

## 2019-04-09 DIAGNOSIS — M5441 Lumbago with sciatica, right side: Secondary | ICD-10-CM

## 2019-04-09 DIAGNOSIS — R262 Difficulty in walking, not elsewhere classified: Secondary | ICD-10-CM

## 2019-04-09 DIAGNOSIS — M6281 Muscle weakness (generalized): Secondary | ICD-10-CM

## 2019-04-09 DIAGNOSIS — M542 Cervicalgia: Secondary | ICD-10-CM

## 2019-04-09 DIAGNOSIS — R2689 Other abnormalities of gait and mobility: Secondary | ICD-10-CM

## 2019-04-09 NOTE — Therapy (Signed)
[place], [place], [address] Phone: [phone]   Fax:  [phone]  Physical Therapy Treatment  Patient Details  Name: Jorge Weaver. MRN: [id number] Date of Birth: Aug 15, 1965 Referring Provider (PT): Ashok Pall MD   Encounter Date: 04/09/2019  PT End of Session - 04/09/19 1019    Visit Number  3    Number of Visits  13    Date for PT Re-Evaluation  05/14/19    PT Start Time  1017    PT Stop Time  1101    PT Time Calculation (min)  44 min    Activity Tolerance  Patient tolerated treatment well    Behavior During Therapy  [facility] for tasks assessed/performed       Past Medical History:  Diagnosis Date  . Anemia    low iron  . Asthma    as a child  . Cervical spondylosis with myelopathy   . Fatty liver   . GERD (gastroesophageal reflux disease)   . Heart murmur    when he was younger   . Hypertension   . Pneumonia     Past Surgical History:  Procedure Laterality Date  . ANTERIOR CERVICAL DECOMP/DISCECTOMY FUSION N/A 01/16/2019   Procedure: Cervical six corpectomy with Cervical five-seven fusion and plating;  Surgeon: Ashok Pall, MD;  Location: [place];  Service: Neurosurgery;  Laterality: N/A;  . NO PAST SURGERIES    . WISDOM TOOTH EXTRACTION    . WRIST SURGERY Left 2014   fx    There were no vitals filed for this visit.  Subjective Assessment - 04/09/19 1019    Subjective  I feel like I'm walking faster and my balance is getting better. MRI is being scheduled. Denies pain but reports feeling spasms in the low back.    Pertinent History  s/p ACDF 01/16/2019, L wrist surgery, anemia, asthmas, GERD, heart murmur, HTN, penumonia, fatty liver. cervical myelopathy    Patient Stated Goals  Stop hurting    Currently in Pain?  No/denies                       [facility] Adult PT Treatment/Exercise - 04/09/19 0001      Exercises   Exercises  Knee/Hip      Lumbar  Exercises: Aerobic   Nustep  L5 X 5 MIN      Lumbar Exercises: Standing   Heel Raises  10 reps      Lumbar Exercises: Seated   Sit to Stand  10 reps      Lumbar Exercises: Supine   Bridge  5 seconds;10 reps    Bridge Limitations  2 sets of 5 after standing TE    Straight Leg Raise  5 reps    Straight Leg Raises Limitations  bil some pain in low back    Other Supine Lumbar Exercises  adductor squeeze with ball 5 sec x 10      Knee/Hip Exercises: Standing   Hip Flexion  Both;2 sets;10 reps;Knee bent    Hip Abduction  Both;2 sets;10 reps;Knee straight    Abduction Limitations  cues to avoid leaning    Hip Extension  Both;2 sets;10 reps;Knee straight    Extension Limitations  cues to avoid leaning forward    Lateral Step Up  Both;10 reps;Hand Hold: 1;Step Height: 6"    Forward Step Up  Both;10 reps;Hand Hold: 0;Step Height: 6"      Modalities  Modalities  Electrical Stimulation;Moist Heat      Moist Heat Therapy   Moist Heat Location  Lumbar Spine      Electrical Stimulation   Electrical Stimulation Location  lumbar    Electrical Stimulation Action  IFC    Electrical Stimulation Parameters  to tolerance    Electrical Stimulation Goals  Pain;Tone             PT Education - 04/09/19 1055    Education Details  HEP PROGRESSED    Person(s) Educated  Patient    Methods  Explanation;Demonstration;Handout    Comprehension  Verbalized understanding;Returned demonstration          PT Long Term Goals - 04/09/19 1207      PT LONG TERM GOAL #1   Title  Pt will be independent in his HEP and progression.    Status  Partially Met      PT LONG TERM GOAL #3   Title  Pt will improve his bilateral LE strength to >/= 4+/5 in order to improve functional mobility.    Status  On-going      PT LONG TERM GOAL #4   Title  Pt will be able to donne his clothes with no pain reported without assistance.    Status  Achieved      PT LONG TERM GOAL #5   Title  Assess BERG and  establish balance goal    Status  Achieved      PT LONG TERM GOAL #6   Title  Improved SLS 7 seconds or longer bil to decrease fall risk.    Status  New    Target Date  04/30/19            Plan - 04/09/19 1058    Clinical Impression Statement  Patient did very well with TE today. He needs VCs for correct form with standing hip exercises due to compensations for weakness. He did report continued stiffness and spasm with TE so did trial of estim with heat. Patient reported decreased tightness at end of session.    Comorbidities  anemia, asthma, heart murmur, HTN, pneumonia, cervical myelopathy, ACDF 01/16/2019, L wrist surgery    PT Frequency  2x / week    PT Duration  6 weeks    PT Treatment/Interventions  ADLs/Self Care Home Management;Cryotherapy;Moist Heat;Dry needling;Electrical Stimulation;Iontophoresis 56m/ml Dexamethasone;Traction;Ultrasound;Manual techniques;Passive range of motion;Taping;Functional mobility training;Stair training;Gait training;Therapeutic activities;Therapeutic exercise;Balance training;Neuromuscular re-education;Patient/family education    PT Next Visit Plan  Nustep, LE strengthening progressing to functional activities needed for work, stretching, trunk mobility, STM and modalities as needed.    PT Home Exercise Plan  [email or web address]    Consulted and Agree with Plan of Care  Patient       Patient will benefit from skilled therapeutic intervention in order to improve the following deficits and impairments:  Pain, Postural dysfunction, Decreased strength, Decreased activity tolerance, Obesity, Decreased range of motion, Difficulty walking, Decreased mobility, Decreased balance  Visit Diagnosis: Chronic bilateral low back pain with bilateral sciatica  Cervicalgia  Difficulty in walking, not elsewhere classified  Other abnormalities of gait and mobility  Muscle weakness (generalized)     Problem List Patient Active Problem List   Diagnosis Date Noted  .  Cervical spondylosis with myelopathy and radiculopathy 01/16/2019  . Spinal stenosis of lumbar region 12/24/2018  . Gastroesophageal reflux disease 06/26/2018  . Essential hypertension 06/17/2018  . Fatty liver   . Witnessed apneic spells 03/28/2017  . Prediabetes 01/18/2017  . Bronchitis  05/21/2014  . ETOH abuse 05/21/2014  . Asthma 01/15/2013  . Asthma with exacerbation 01/15/2013  . Generalized anxiety disorder 08/24/2012  . Malaise and fatigue 08/24/2012    Madelyn Flavors PT 04/09/2019, 12:08 PM  [facility] [place], [place], [address] Phone: [phone]   Fax:  [phone]  Name: Jorge Weaver. MRN: [id number] Date of Birth: 11-17-1965

## 2019-04-09 NOTE — Patient Instructions (Signed)
Access Code: [email or web address]  URL: [email or web address]  Date: 04/09/2019  Prepared by: Raynelle Fanning Iyad Deroo   Exercises Hooklying Hamstring Stretch with Strap - 5 reps - 30 seocnds hold - 3x daily - 7x weekly Hooklying Isometric Clamshell - 10 reps - 2 sets - 3-5 seconds hold - 3x daily - 7x weekly Supine Posterior Pelvic Tilt - 10 reps - 2 sets - 5 seconds hold - 3x daily - 7x weekly Bridge - 10 reps - 3 sets - 1x daily - 7x weekly Step Up - 10 reps - 3 sets - 1x daily - 7x weekly Lateral Step Up - 10 reps - 3 sets - 1x daily - 7x weekly Standing Hip Flexion - 10 reps - 3 sets - 1x daily - 7x weekly Standing Hip Extension - 10 reps - 3 sets - 1x daily - 7x weekly Standing Hip Abduction - 10 reps - 3 sets - 1x daily - 7x weekly Patient Education Hip Osteoarthritis Ice Heat

## 2019-04-14 ENCOUNTER — Other Ambulatory Visit (HOSPITAL_BASED_OUTPATIENT_CLINIC_OR_DEPARTMENT_OTHER): Payer: Self-pay | Admitting: Neurosurgery

## 2019-04-14 DIAGNOSIS — M4317 Spondylolisthesis, lumbosacral region: Secondary | ICD-10-CM

## 2019-04-16 ENCOUNTER — Encounter: Payer: Self-pay | Admitting: Physical Therapy

## 2019-04-16 ENCOUNTER — Other Ambulatory Visit: Payer: Self-pay

## 2019-04-16 ENCOUNTER — Ambulatory Visit: Payer: BC Managed Care – PPO | Admitting: Physical Therapy

## 2019-04-16 DIAGNOSIS — R2689 Other abnormalities of gait and mobility: Secondary | ICD-10-CM | POA: Diagnosis not present

## 2019-04-16 DIAGNOSIS — M5442 Lumbago with sciatica, left side: Secondary | ICD-10-CM | POA: Diagnosis not present

## 2019-04-16 DIAGNOSIS — G8929 Other chronic pain: Secondary | ICD-10-CM

## 2019-04-16 DIAGNOSIS — M6281 Muscle weakness (generalized): Secondary | ICD-10-CM

## 2019-04-16 DIAGNOSIS — M5441 Lumbago with sciatica, right side: Secondary | ICD-10-CM

## 2019-04-16 DIAGNOSIS — R262 Difficulty in walking, not elsewhere classified: Secondary | ICD-10-CM

## 2019-04-16 DIAGNOSIS — M542 Cervicalgia: Secondary | ICD-10-CM

## 2019-04-16 NOTE — Therapy (Addendum)
[place], [place], [address] Phone: [phone]   Fax:  [phone]  Physical Therapy Treatment and Discharge Summary  Patient Details  Name: Jorge Weaver. MRN: [id number] Date of Birth: 01/26/1966 Referring Provider (PT): Ashok Pall MD   Encounter Date: 04/16/2019  PT End of Session - 04/16/19 1023    Visit Number  4    Number of Visits  13    Date for PT Re-Evaluation  05/14/19    PT Start Time  1020    PT Stop Time  1111    PT Time Calculation (min)  51 min    Activity Tolerance  Patient tolerated treatment well    Behavior During Therapy  [facility] for tasks assessed/performed       Past Medical History:  Diagnosis Date  . Anemia    low iron  . Asthma    as a child  . Cervical spondylosis with myelopathy   . Fatty liver   . GERD (gastroesophageal reflux disease)   . Heart murmur    when he was younger   . Hypertension   . Pneumonia     Past Surgical History:  Procedure Laterality Date  . ANTERIOR CERVICAL DECOMP/DISCECTOMY FUSION N/A 01/16/2019   Procedure: Cervical six corpectomy with Cervical five-seven fusion and plating;  Surgeon: Ashok Pall, MD;  Location: [place];  Service: Neurosurgery;  Laterality: N/A;  . NO PAST SURGERIES    . WISDOM TOOTH EXTRACTION    . WRIST SURGERY Left 2014   fx    There were no vitals filed for this visit.  Subjective Assessment - 04/16/19 1023    Subjective  Still feeling spasms in low back. MRI scheduled for this Saturday.    Pertinent History  s/p ACDF 01/16/2019, L wrist surgery, anemia, asthmas, GERD, heart murmur, HTN, penumonia, fatty liver. cervical myelopathy    Limitations  Sitting;Lifting;Standing;Walking;House hold activities    Patient Stated Goals  Stop hurting    Currently in Pain?  No/denies                       [facility] Adult PT Treatment/Exercise - 04/16/19 0001      Lumbar Exercises: Stretches   Active  Hamstring Stretch  Right;Left;1 rep;60 seconds    Active Hamstring Stretch Limitations  seated with leg on stool     Gastroc Stretch  Right;Left;1 rep;60 seconds      Lumbar Exercises: Aerobic   Nustep  L5 X 5 MIN      Lumbar Exercises: Machines for Strengthening   Other Lumbar Machine Exercise  triceps ext 2 plates; lat pull 4 plates 2x12      Lumbar Exercises: Standing   Row  Both;15 reps;Theraband    Theraband Level (Row)  Level 3 (Green)    Row Limitations  Also Conservation officer, nature with 5# x 10 bil    Shoulder Extension  Strengthening;15 reps    Theraband Level (Shoulder Extension)  Level 3 (Green)      Lumbar Exercises: Seated   Other Seated Lumbar Exercises  rows green x 15      Knee/Hip Exercises: Standing   Functional Squat  2 sets   1x8, 1x10   Functional Squat Limitations  cues for wider stance    Other Standing Knee Exercises  5 x to 1/2 kneel bil with one UE support      Modalities   Modalities  Electrical Stimulation;Moist Heat  Moist Heat Therapy   Number Minutes Moist Heat  10 Minutes    Moist Heat Location  Lumbar Spine      Electrical Stimulation   Electrical Stimulation Location  lumbar    Electrical Stimulation Action  IFC     Electrical Stimulation Parameters  to tolerance x 65mn    Electrical Stimulation Goals  Pain             PT Education - 04/16/19 1026    Education Details  TENs ed and HEP progressed    Person(s) Educated  Patient    Methods  Explanation;Handout    Comprehension  Verbalized understanding          PT Long Term Goals - 04/09/19 1207      PT LONG TERM GOAL #1   Title  Pt will be independent in his HEP and progression.    Status  Partially Met      PT LONG TERM GOAL #3   Title  Pt will improve his bilateral LE strength to >/= 4+/5 in order to improve functional mobility.    Status  On-going      PT LONG TERM GOAL #4   Title  Pt will be able to donne his clothes with no pain reported without assistance.    Status   Achieved      PT LONG TERM GOAL #5   Title  Assess BERG and establish balance goal    Status  Achieved      PT LONG TERM GOAL #6   Title  Improved SLS 7 seconds or longer bil to decrease fall risk.    Status  New    Target Date  04/30/19            Plan - 04/16/19 1249    Clinical Impression Statement  Patient doing well with strengthening. He reports he is supposed to RTW 04/29/19, so we worked on upper and lower body strengthening as well as functional activities. He did well with these although week. No pain reported in back with TE, just pressure.    Comorbidities  anemia, asthma, heart murmur, HTN, pneumonia, cervical myelopathy, ACDF 01/16/2019, L wrist surgery    PT Treatment/Interventions  ADLs/Self Care Home Management;Cryotherapy;Moist Heat;Dry needling;Electrical Stimulation;Iontophoresis 457mml Dexamethasone;Traction;Ultrasound;Manual techniques;Passive range of motion;Taping;Functional mobility training;Stair training;Gait training;Therapeutic activities;Therapeutic exercise;Balance training;Neuromuscular re-education;Patient/family education    PT Next Visit Plan  Nustep, LE strengthening progressing to functional activities needed for work, stretching, trunk mobility, STM and modalities as needed.    PT Home Exercise Plan  [email or web address]       Patient will benefit from skilled therapeutic intervention in order to improve the following deficits and impairments:  Pain, Postural dysfunction, Decreased strength, Decreased activity tolerance, Obesity, Decreased range of motion, Difficulty walking, Decreased mobility, Decreased balance  Visit Diagnosis: Chronic bilateral low back pain with bilateral sciatica  Cervicalgia  Difficulty in walking, not elsewhere classified  Other abnormalities of gait and mobility  Muscle weakness (generalized)     Problem List Patient Active Problem List   Diagnosis Date Noted  . Cervical spondylosis with myelopathy and radiculopathy  01/16/2019  . Spinal stenosis of lumbar region 12/24/2018  . Gastroesophageal reflux disease 06/26/2018  . Essential hypertension 06/17/2018  . Fatty liver   . Witnessed apneic spells 03/28/2017  . Prediabetes 01/18/2017  . Bronchitis 05/21/2014  . ETOH abuse 05/21/2014  . Asthma 01/15/2013  . Asthma with exacerbation 01/15/2013  . Generalized anxiety disorder 08/24/2012  . Malaise and fatigue  08/24/2012    Madelyn Flavors PT 04/16/2019, 1:00 PM  [facility] [place], [place], [address] Phone: [phone]   Fax:  [phone]  Name: Mareon Robinette. MRN: [id number] Date of Birth: 01-09-1966  PHYSICAL THERAPY DISCHARGE SUMMARY  Visits from Start of Care: 4  Current functional level related to goals / functional outcomes: Unable to assess   Remaining deficits: Unable to assess   Education / Equipment: HEP Plan: Patient agrees to discharge.  Patient goals were partially met. Patient is being discharged due to not returning since the last visit.  ?????    Madelyn Flavors, PT 05/21/19 8:49 PM  [facility] [place], [place] [address]  [phone] (office) [phone] (fax)

## 2019-04-16 NOTE — Patient Instructions (Signed)
Access Code: [email or web address]  URL: [email or web address]  Date: 04/16/2019  Prepared by: Raynelle Fanning Annaclaire Walsworth   Exercises Hooklying Hamstring Stretch with Strap - 5 reps - 30 seocnds hold - 3x daily - 7x weekly Hooklying Isometric Clamshell - 10 reps - 2 sets - 3-5 seconds hold - 3x daily - 7x weekly Supine Posterior Pelvic Tilt - 10 reps - 2 sets - 5 seconds hold - 3x daily - 7x weekly Bridge - 10 reps - 3 sets - 1x daily - 7x weekly Step Up - 10 reps - 3 sets - 1x daily - 7x weekly Lateral Step Up - 10 reps - 3 sets - 1x daily - 7x weekly Standing Hip Flexion - 10 reps - 3 sets - 1x daily - 7x weekly Standing Hip Extension - 10 reps - 3 sets - 1x daily - 7x weekly Standing Hip Abduction - 10 reps - 3 sets - 1x daily - 7x weekly Scapular Retraction with Resistance - 10 reps - 3 sets - 2-3 sec hold - 1x daily - 7x weekly Scapular Retraction with Resistance Advanced - 10 reps - 3 sets - 2-3 sec hold - 1x daily - 7x weekly Patient Education Hip Osteoarthritis Ice Heat TENS Unit

## 2019-04-19 ENCOUNTER — Other Ambulatory Visit: Payer: Self-pay

## 2019-04-19 ENCOUNTER — Ambulatory Visit (HOSPITAL_BASED_OUTPATIENT_CLINIC_OR_DEPARTMENT_OTHER)
Admission: RE | Admit: 2019-04-19 | Discharge: 2019-04-19 | Disposition: A | Discharge status: Home / Self Care | Payer: BC Managed Care – PPO | Source: Ambulatory Visit | Attending: Neurosurgery | Admitting: Neurosurgery

## 2019-04-19 DIAGNOSIS — M4317 Spondylolisthesis, lumbosacral region: Secondary | ICD-10-CM | POA: Diagnosis not present

## 2019-04-19 DIAGNOSIS — M545 Low back pain: Secondary | ICD-10-CM | POA: Diagnosis not present

## 2019-04-29 DIAGNOSIS — M4317 Spondylolisthesis, lumbosacral region: Secondary | ICD-10-CM | POA: Diagnosis not present

## 2019-04-29 DIAGNOSIS — Z6839 Body mass index (BMI) 39.0-39.9, adult: Secondary | ICD-10-CM | POA: Diagnosis not present

## 2019-04-29 DIAGNOSIS — M4712 Other spondylosis with myelopathy, cervical region: Secondary | ICD-10-CM | POA: Diagnosis not present

## 2019-04-29 DIAGNOSIS — I10 Essential (primary) hypertension: Secondary | ICD-10-CM | POA: Diagnosis not present

## 2019-06-09 ENCOUNTER — Encounter: Payer: Self-pay | Admitting: Family Medicine

## 2019-06-09 ENCOUNTER — Other Ambulatory Visit: Payer: Self-pay

## 2019-06-09 ENCOUNTER — Ambulatory Visit: Payer: BC Managed Care – PPO | Admitting: Family Medicine

## 2019-06-09 VITALS — BP 106/76 | HR 99 | Temp 97.6°F | Ht 70.0 in | Wt 269.5 lb

## 2019-06-09 DIAGNOSIS — R42 Dizziness and giddiness: Secondary | ICD-10-CM | POA: Diagnosis not present

## 2019-06-09 DIAGNOSIS — R221 Localized swelling, mass and lump, neck: Secondary | ICD-10-CM | POA: Diagnosis not present

## 2019-06-09 DIAGNOSIS — R220 Localized swelling, mass and lump, head: Secondary | ICD-10-CM | POA: Diagnosis not present

## 2019-06-09 LAB — EKG 12-LEAD

## 2019-06-09 MED ORDER — FAMOTIDINE 20 MG PO TABS: 20.0000 mg | ORAL_TABLET | Freq: Two times a day (BID) | ORAL | 2 refills | Status: DC

## 2019-06-09 MED ORDER — LEVOCETIRIZINE DIHYDROCHLORIDE 5 MG PO TABS: 5.0000 mg | ORAL_TABLET | Freq: Every evening | ORAL | 2 refills | Status: DC

## 2019-06-09 MED ORDER — PREDNISONE 20 MG PO TABS: 40.0000 mg | ORAL_TABLET | Freq: Every day | ORAL | 0 refills | Status: AC

## 2019-06-09 NOTE — Progress Notes (Signed)
Chief Complaint  Patient presents with  . Allergic Reaction    one week  . Dizziness    Jorge Weaver. is a 54 y.o. male here for an allergic reaction.  Duration: 1 week Any new medications, lotions, soaps, topicals or detergents? No ACEi/ARB/Estrogen? No Hx of allergic rxn/angioedema/anaphylaxis? No he  specifically denies shortness of breath, tongue or lip swelling, or swelling in the throat. Took Aleve. No antihistamines.    Past Medical History:  Diagnosis Date  . Anemia    low iron  . Asthma    as a child  . Cervical spondylosis with myelopathy   . Fatty liver   . GERD (gastroesophageal reflux disease)   . Heart murmur    when he was younger   . Hypertension   . Pneumonia     Family History  Problem Relation Age of Onset  . Hypertension Mother   . Heart attack Father   . Colon cancer Sister   . Esophageal cancer Neg Hx   . Rectal cancer Neg Hx   . Stomach cancer Neg Hx     BP 106/76 (BP Location: Right Arm, Patient Position: Sitting, Cuff Size: Large)   Pulse 99   Temp 97.6 F (36.4 C) (Oral)   Ht 5\' 10"  (1.778 m)   Wt 269 lb 8 oz (122.2 kg)   SpO2 95%   BMI 38.67 kg/m  General: Diaphoretic, appearing stated age, well-nourished, awake HEENT: Ears are patent, TM's negative, Nose patent without discharge, MMM, tongue without deviation or edema, uvula without edema, pharynx without erythema or petechiae; Neck without masses, edema or asymmetry Heart: RRR Lungs: CTAB, no rales or stridor, normal respiratory effort without accessory muscle use Skin: Exposed skin is warm and dry without lesion Psych: Age appropriate judgment and insight, normal affect and mood  Light headedness - Plan: EKG 12-Lead  Swelling of lip, tongue, and throat - Plan: Ambulatory referral to Allergy  I do not see that he is on any medications that would cause concern for angioedema.  I do not see the patient often enough to appreciate any significant edema of his tongue or lips.   There is no asymmetry.  We will treat with prednisone, Pepcid, and Xyzal.  We will also refer to the allergy team. Due to him being diaphoretic, we checked an EKG that is unremarkable.  He denies any exertional chest pain or shortness of breath.  He does need to get his colonoscopy which was strongly recommended that he get done.  He will call them.  He was instructed to seek emergent care if he starts having increased swelling in his mouth/neck, shortness of breath, chest pain, or worsening symptoms. He has an EpiPen at home. He needs to drink more water, I think he is getting less than 20 ounces daily. The patient voiced understanding and agreement to the plan.  [place], DO 06/09/19 4:37 PM

## 2019-06-09 NOTE — Patient Instructions (Signed)
Get your colonoscopy!  If you have chest pain, swelling in your throat or shortness of breath, go to ER.  If you do not hear anything about your referral in the next 1-2 weeks, call our office and ask for an update.  Let us know if you need anything.

## 2019-06-13 ENCOUNTER — Telehealth: Payer: Self-pay | Admitting: *Deleted

## 2019-06-13 NOTE — Telephone Encounter (Signed)
Who Is Calling Patient / Member / Family / Caregiver Call Type Triage / Clinical Relationship To Patient Self Return Phone Number [phone] (Primary) Chief Complaint Swelling (generalized) Reason for Call Symptomatic / Request for Health Information Initial Comment Caller's husband was seen on Monday and is still having sxs and thinks he may need to be seen again. Was prescribed Rx for swelling and inflammation. GOTO Facility Not Listed Pt states he will go to [address] in [place] Translation No Nurse Assessment Nurse: Donata Clay, RN, [place] Date/Time (Eastern Time): 06/12/2019 6:02:40 PM Confirm and document reason for call. If symptomatic, describe symptoms. ---Pt states he was seen on Monday and is still having sxs and thinks he may need to be seen again. States swelling in face and numbness in lips are the sx. States sx have worsened since Monday. States he is unable to drink or eat very well. States he is taking med prescribed for inflammation as prescribed. States the med is at his house and he is not at his house, Reports no pain or any other sx currently. Reports dr did not tell him what was thought to cause the swelling and numbness  Guideline Title Affirmed Question Affirmed Notes Nurse Date/Time Lamount Cohen Time) Face Swelling Patient sounds very sick or weak to the triager Donata Clay, RN, [facility] 06/12/2019 6:06:44 PM Disp. Time Lamount Cohen Time) Disposition Final User 06/12/2019 6:14:58 PM Go to ED Now (or PCP triage) Yes Donata Clay, RN, Cyndi

## 2019-06-13 NOTE — Telephone Encounter (Signed)
Called left message to call back 

## 2019-06-13 NOTE — Telephone Encounter (Signed)
Left message on machine to call back with status and to see if they maybe need appt.  Did not see where they went to ED yesterday.

## 2019-06-16 ENCOUNTER — Encounter: Payer: Self-pay | Admitting: Family Medicine

## 2019-06-16 ENCOUNTER — Other Ambulatory Visit: Payer: Self-pay

## 2019-06-16 ENCOUNTER — Ambulatory Visit: Payer: BC Managed Care – PPO | Admitting: Family Medicine

## 2019-06-16 ENCOUNTER — Telehealth: Payer: Self-pay | Admitting: Family Medicine

## 2019-06-16 VITALS — BP 128/78 | HR 112 | Temp 95.2°F | Ht 70.0 in | Wt 270.0 lb

## 2019-06-16 DIAGNOSIS — R32 Unspecified urinary incontinence: Secondary | ICD-10-CM

## 2019-06-16 DIAGNOSIS — R1310 Dysphagia, unspecified: Secondary | ICD-10-CM | POA: Diagnosis not present

## 2019-06-16 DIAGNOSIS — R4702 Dysphasia: Secondary | ICD-10-CM

## 2019-06-16 DIAGNOSIS — R29898 Other symptoms and signs involving the musculoskeletal system: Secondary | ICD-10-CM

## 2019-06-16 DIAGNOSIS — R200 Anesthesia of skin: Secondary | ICD-10-CM | POA: Diagnosis not present

## 2019-06-16 MED ORDER — HYDROCORTISONE 2.5 % EX CREA: TOPICAL_CREAM | Freq: Two times a day (BID) | CUTANEOUS | 0 refills | Status: AC

## 2019-06-16 NOTE — Telephone Encounter (Signed)
The wife called and the patient is still experiencing numbness/swelling and got benadryl over the weekend and is no better. Scheduled today at 4:15.

## 2019-06-16 NOTE — Progress Notes (Signed)
Chief Complaint  Patient presents with  . Numbness    facial, lip and legs is no better.    Subjective: Patient is a 54 y.o. male here for f/u.  He is here with his wife who helps provide the history.  The patient was treated for an allergic reaction with prednisone, Pepcid, and an antihistamine 1 week ago.  He had face and lip swelling and symptoms are no better.  He continues to have swelling which she now attributes to biting of his lips.  He has been having discoordination, trouble with speech, difficulty swallowing, and numbness over his face bilaterally since this time.  No injury or change in activity.  He denies recent illness, new topicals, vision changes, or headaches.  He recently had surgery on his lower back and continues to have lower extremity weakness.    He also continues to have urinary incontinence.  He saw urologist but there appears to be a misunderstanding and he was just given a prescription for sildenafil.  Both he and his family would like a second opinion.  Past Medical History:  Diagnosis Date  . Anemia    low iron  . Asthma    as a child  . Cervical spondylosis with myelopathy   . Fatty liver   . GERD (gastroesophageal reflux disease)   . Heart murmur    when he was younger   . Hypertension   . Pneumonia     Objective: BP 128/78 (BP Location: Left Arm, Patient Position: Sitting, Cuff Size: Large)   Pulse (!) 112   Temp (!) 95.2 F (35.1 C) (Temporal)   Ht 5\' 10"  (1.778 m)   Wt 270 lb (122.5 kg)   SpO2 94%   BMI 38.74 kg/m  General: Awake, appears stated age HEENT: MMM, EOMi Heart: RRR Lungs: CTAB, no rales, wheezes or rhonchi. No accessory muscle use Neuro: Sensation mildly blunted to light touch over the V3 distribution of face but is equal bilaterally; there is no facial droop; cranial nerves are intact; speech is somewhat slurred at times; there is 4/5 strength with left elbow flexion and bilateral hip flexion; 5/5 strength throughout  otherwise Psych: Age appropriate judgment and insight, normal affect and mood  Assessment and Plan: Urinary incontinence, unspecified type - Plan: Ambulatory referral to Urology  Facial numbness - Plan: MR Brain Wo Contrast  Weakness of both lower extremities  Dysphagia, unspecified type - Plan: MR Brain Wo Contrast  Dysphasia - Plan: MR Brain Wo Contrast  This is quite a complicated case.  I do not think he is having an allergic reaction given 0 improvement with treatment.  I would like him to call his neurologist.  In the meantime I will order an MRI of his brain. Regarding his urinary incontinence, he is to reach out to his neurosurgeon to see if this is par for the course.  I will refer him to another urology office for a second opinion.  Both he and his wife for encouraged to make sure the urologist knows this consultation is not for erectile dysfunction but for urinary incontinence. I would also like them to reach out to his neurosurgeon regarding the continued weakness in the lower extremities.  I explained to them that I am not well versed on normal recovery with such a procedure. Follow-up as needed with me. The patient and his wife voiced understanding and agreement to the plan.  [place], DO 06/16/19  7:22 PM

## 2019-06-16 NOTE — Telephone Encounter (Signed)
Have scheduled appt today for continuing symptoms/no improvement

## 2019-06-16 NOTE — Telephone Encounter (Signed)
Patient's wife called states symptoms haven't improved.Numbness is face

## 2019-06-16 NOTE — Patient Instructions (Addendum)
Call Dr. Jackelyn Knife office regarding the urination issue. We have placed a referral but we can cancel if he does not think this evaluation is necessary. I will defer to his office regarding continued weakness in the legs.  For the facial/speech issues, we have ordered an MRI. I would put a call out to the neurology team for an appointment as a contingency plan.  Get your colonoscopy done soon. This is very important.  Chance your facial soap and toothpaste.   Let us know if you need anything.

## 2019-06-17 ENCOUNTER — Telehealth: Payer: Self-pay

## 2019-06-17 NOTE — Telephone Encounter (Signed)
Patient's wife called in to see if she can speak to the nurse about the referral paper work that needs to be sent to the specialist Please follow up with the patients wife at  [phone]

## 2019-06-18 NOTE — Telephone Encounter (Signed)
Called the patients wife and Dr. Sueanne Margarita office needs most recent office notes from PCP Will copy and fax to that office Attn To Aram Beecham

## 2019-06-19 ENCOUNTER — Telehealth: Payer: Self-pay

## 2019-06-19 NOTE — Telephone Encounter (Signed)
Spoke to [place]. She has spoken to Dr. Sueanne Margarita office today to let them know the patient has fell 3 times last night. Today  He did go to work but it took him 2 hours to get there.   Dr. Sueanne Margarita office is aware of problem and were to call her back but have not as of yet. Mickeal Skinner is afraid he is not going to be able to work now or maybe ever and they may need to do disability paper work.  [place] would like a referral to [facility] as was discussed at his last OV.  It is just taking too long for Dr. Sueanne Margarita office to contact them regarding an appt and they need answers

## 2019-06-19 NOTE — Telephone Encounter (Signed)
The referral to [facility] was already placed. Can we check on the status? Thanks.

## 2019-06-19 NOTE — Telephone Encounter (Signed)
Patient wife called in needing to speak with Dr. [name] nurse Zella Ball. Please Give the patients wife Mrs. Carruthers  a call back at [phone] thanks,

## 2019-06-19 NOTE — Telephone Encounter (Signed)
It has been done and called the patient with the contact information,left msg to call back

## 2019-06-19 NOTE — Telephone Encounter (Signed)
Called and gave his wife the contact information for [facility]

## 2019-06-23 DIAGNOSIS — M4712 Other spondylosis with myelopathy, cervical region: Secondary | ICD-10-CM | POA: Diagnosis not present

## 2019-06-25 ENCOUNTER — Other Ambulatory Visit (HOSPITAL_BASED_OUTPATIENT_CLINIC_OR_DEPARTMENT_OTHER): Payer: Self-pay | Admitting: Neurosurgery

## 2019-06-25 DIAGNOSIS — M4712 Other spondylosis with myelopathy, cervical region: Secondary | ICD-10-CM

## 2019-06-27 ENCOUNTER — Inpatient Hospital Stay (HOSPITAL_COMMUNITY)
Admission: EM | Admit: 2019-06-27 | Discharge: 2019-07-07 | DRG: 095 | Disposition: A | Discharge status: Rehabilitation Facility | Payer: BC Managed Care – PPO | Attending: Internal Medicine | Admitting: Internal Medicine

## 2019-06-27 ENCOUNTER — Encounter (HOSPITAL_COMMUNITY): Payer: Self-pay | Admitting: Emergency Medicine

## 2019-06-27 ENCOUNTER — Telehealth: Payer: Self-pay | Admitting: Family Medicine

## 2019-06-27 ENCOUNTER — Emergency Department (HOSPITAL_COMMUNITY): Payer: BC Managed Care – PPO

## 2019-06-27 ENCOUNTER — Other Ambulatory Visit: Payer: Self-pay

## 2019-06-27 DIAGNOSIS — R0602 Shortness of breath: Secondary | ICD-10-CM | POA: Diagnosis not present

## 2019-06-27 DIAGNOSIS — K760 Fatty (change of) liver, not elsewhere classified: Secondary | ICD-10-CM | POA: Diagnosis not present

## 2019-06-27 DIAGNOSIS — R29810 Facial weakness: Secondary | ICD-10-CM | POA: Diagnosis not present

## 2019-06-27 DIAGNOSIS — R32 Unspecified urinary incontinence: Secondary | ICD-10-CM | POA: Diagnosis not present

## 2019-06-27 DIAGNOSIS — G4700 Insomnia, unspecified: Secondary | ICD-10-CM | POA: Diagnosis not present

## 2019-06-27 DIAGNOSIS — E222 Syndrome of inappropriate secretion of antidiuretic hormone: Secondary | ICD-10-CM | POA: Diagnosis present

## 2019-06-27 DIAGNOSIS — R945 Abnormal results of liver function studies: Secondary | ICD-10-CM | POA: Diagnosis not present

## 2019-06-27 DIAGNOSIS — Z981 Arthrodesis status: Secondary | ICD-10-CM

## 2019-06-27 DIAGNOSIS — R0989 Other specified symptoms and signs involving the circulatory and respiratory systems: Secondary | ICD-10-CM | POA: Diagnosis not present

## 2019-06-27 DIAGNOSIS — M5021 Other cervical disc displacement,  high cervical region: Secondary | ICD-10-CM | POA: Diagnosis not present

## 2019-06-27 DIAGNOSIS — K219 Gastro-esophageal reflux disease without esophagitis: Secondary | ICD-10-CM | POA: Diagnosis present

## 2019-06-27 DIAGNOSIS — Z800 Family history of malignant neoplasm of digestive organs: Secondary | ICD-10-CM | POA: Diagnosis not present

## 2019-06-27 DIAGNOSIS — K592 Neurogenic bowel, not elsewhere classified: Secondary | ICD-10-CM | POA: Diagnosis not present

## 2019-06-27 DIAGNOSIS — G723 Periodic paralysis: Secondary | ICD-10-CM | POA: Diagnosis present

## 2019-06-27 DIAGNOSIS — I10 Essential (primary) hypertension: Secondary | ICD-10-CM | POA: Diagnosis present

## 2019-06-27 DIAGNOSIS — R7303 Prediabetes: Secondary | ICD-10-CM | POA: Diagnosis not present

## 2019-06-27 DIAGNOSIS — R531 Weakness: Secondary | ICD-10-CM

## 2019-06-27 DIAGNOSIS — G9589 Other specified diseases of spinal cord: Secondary | ICD-10-CM | POA: Diagnosis present

## 2019-06-27 DIAGNOSIS — R471 Dysarthria and anarthria: Secondary | ICD-10-CM | POA: Diagnosis present

## 2019-06-27 DIAGNOSIS — M4804 Spinal stenosis, thoracic region: Secondary | ICD-10-CM | POA: Diagnosis not present

## 2019-06-27 DIAGNOSIS — R5381 Other malaise: Secondary | ICD-10-CM | POA: Diagnosis not present

## 2019-06-27 DIAGNOSIS — D62 Acute posthemorrhagic anemia: Secondary | ICD-10-CM | POA: Diagnosis not present

## 2019-06-27 DIAGNOSIS — R05 Cough: Secondary | ICD-10-CM | POA: Diagnosis not present

## 2019-06-27 DIAGNOSIS — M48061 Spinal stenosis, lumbar region without neurogenic claudication: Secondary | ICD-10-CM | POA: Diagnosis present

## 2019-06-27 DIAGNOSIS — S91201A Unspecified open wound of right great toe with damage to nail, initial encounter: Secondary | ICD-10-CM | POA: Diagnosis present

## 2019-06-27 DIAGNOSIS — G610 Guillain-Barre syndrome: Secondary | ICD-10-CM | POA: Diagnosis not present

## 2019-06-27 DIAGNOSIS — M792 Neuralgia and neuritis, unspecified: Secondary | ICD-10-CM | POA: Diagnosis not present

## 2019-06-27 DIAGNOSIS — R259 Unspecified abnormal involuntary movements: Secondary | ICD-10-CM | POA: Diagnosis not present

## 2019-06-27 DIAGNOSIS — E876 Hypokalemia: Secondary | ICD-10-CM | POA: Diagnosis present

## 2019-06-27 DIAGNOSIS — E871 Hypo-osmolality and hyponatremia: Secondary | ICD-10-CM | POA: Diagnosis not present

## 2019-06-27 DIAGNOSIS — R7401 Elevation of levels of liver transaminase levels: Secondary | ICD-10-CM | POA: Diagnosis not present

## 2019-06-27 DIAGNOSIS — Z20822 Contact with and (suspected) exposure to covid-19: Secondary | ICD-10-CM | POA: Diagnosis present

## 2019-06-27 DIAGNOSIS — F1010 Alcohol abuse, uncomplicated: Secondary | ICD-10-CM | POA: Diagnosis not present

## 2019-06-27 DIAGNOSIS — Z79899 Other long term (current) drug therapy: Secondary | ICD-10-CM | POA: Diagnosis not present

## 2019-06-27 DIAGNOSIS — R278 Other lack of coordination: Secondary | ICD-10-CM | POA: Diagnosis present

## 2019-06-27 DIAGNOSIS — N319 Neuromuscular dysfunction of bladder, unspecified: Secondary | ICD-10-CM | POA: Diagnosis not present

## 2019-06-27 DIAGNOSIS — R270 Ataxia, unspecified: Secondary | ICD-10-CM | POA: Diagnosis not present

## 2019-06-27 DIAGNOSIS — R29818 Other symptoms and signs involving the nervous system: Secondary | ICD-10-CM | POA: Diagnosis not present

## 2019-06-27 DIAGNOSIS — M47812 Spondylosis without myelopathy or radiculopathy, cervical region: Secondary | ICD-10-CM | POA: Diagnosis not present

## 2019-06-27 DIAGNOSIS — M5124 Other intervertebral disc displacement, thoracic region: Secondary | ICD-10-CM | POA: Diagnosis not present

## 2019-06-27 DIAGNOSIS — R296 Repeated falls: Secondary | ICD-10-CM | POA: Diagnosis present

## 2019-06-27 DIAGNOSIS — Z8249 Family history of ischemic heart disease and other diseases of the circulatory system: Secondary | ICD-10-CM | POA: Diagnosis not present

## 2019-06-27 DIAGNOSIS — M4802 Spinal stenosis, cervical region: Secondary | ICD-10-CM | POA: Diagnosis not present

## 2019-06-27 LAB — CBC
HCT: 36.9 % — ABNORMAL LOW (ref 39.0–52.0)
Hemoglobin: 12.1 g/dL — ABNORMAL LOW (ref 13.0–17.0)
MCH: 35.2 pg — ABNORMAL HIGH (ref 26.0–34.0)
MCHC: 32.8 g/dL (ref 30.0–36.0)
MCV: 107.3 fL — ABNORMAL HIGH (ref 80.0–100.0)
Platelets: 374 10*3/uL (ref 150–400)
RBC: 3.44 MIL/uL — ABNORMAL LOW (ref 4.22–5.81)
RDW: 13.8 % (ref 11.5–15.5)
WBC: 3.8 10*3/uL — ABNORMAL LOW (ref 4.0–10.5)
nRBC: 0 % (ref 0.0–0.2)

## 2019-06-27 LAB — URINALYSIS, ROUTINE W REFLEX MICROSCOPIC
Glucose, UA: NEGATIVE mg/dL
Hgb urine dipstick: NEGATIVE
Ketones, ur: 5 mg/dL — AB
Leukocytes,Ua: NEGATIVE
Nitrite: NEGATIVE
Protein, ur: 100 mg/dL — AB
Specific Gravity, Urine: 1.029 (ref 1.005–1.030)
pH: 7 (ref 5.0–8.0)

## 2019-06-27 LAB — CBG MONITORING, ED: Glucose-Capillary: 125 mg/dL — ABNORMAL HIGH (ref 70–99)

## 2019-06-27 LAB — BASIC METABOLIC PANEL
Anion gap: 14 (ref 5–15)
BUN: 6 mg/dL (ref 6–20)
CO2: 33 mmol/L — ABNORMAL HIGH (ref 22–32)
Calcium: 8.7 mg/dL — ABNORMAL LOW (ref 8.9–10.3)
Chloride: 89 mmol/L — ABNORMAL LOW (ref 98–111)
Creatinine, Ser: 0.82 mg/dL (ref 0.61–1.24)
GFR calc Af Amer: >60 mL/min (ref >60)
GFR calc non Af Amer: >60 mL/min (ref >60)
Glucose, Bld: 122 mg/dL — ABNORMAL HIGH (ref 70–99)
Potassium: 2.2 mmol/L — CL (ref 3.5–5.1)
Sodium: 136 mmol/L (ref 135–145)

## 2019-06-27 LAB — MAGNESIUM: Magnesium: 1.7 mg/dL (ref 1.7–2.4)

## 2019-06-27 MED ORDER — GADOBUTROL 1 MMOL/ML IV SOLN
10.0000 mL | Freq: Once | INTRAVENOUS | Status: AC | PRN
  Administered 2019-06-27: 10 mL via INTRAVENOUS

## 2019-06-27 MED ORDER — POTASSIUM CHLORIDE 10 MEQ/100ML IV SOLN
10.0000 meq | INTRAVENOUS | Status: AC
  Administered 2019-06-27: 10 meq via INTRAVENOUS
  Filled 2019-06-27 (×2): qty 100

## 2019-06-27 MED ORDER — POTASSIUM CHLORIDE CRYS ER 20 MEQ PO TBCR
80.0000 meq | EXTENDED_RELEASE_TABLET | Freq: Once | ORAL | Status: AC
  Administered 2019-06-27: 80 meq via ORAL
  Filled 2019-06-27: qty 4

## 2019-06-27 MED ORDER — SODIUM CHLORIDE 0.9% FLUSH: 3.0000 mL | Freq: Once | INTRAVENOUS | Status: DC

## 2019-06-27 MED ORDER — POTASSIUM CHLORIDE CRYS ER 20 MEQ PO TBCR
40.0000 meq | EXTENDED_RELEASE_TABLET | Freq: Once | ORAL | Status: AC
  Administered 2019-06-27: 40 meq via ORAL
  Filled 2019-06-27: qty 2

## 2019-06-27 MED ORDER — THIAMINE HCL 100 MG/ML IJ SOLN
100.0000 mg | Freq: Once | INTRAMUSCULAR | Status: AC
  Administered 2019-06-27: 100 mg via INTRAVENOUS
  Filled 2019-06-27: qty 2

## 2019-06-27 NOTE — Telephone Encounter (Signed)
PT's wife calling requesting for a callback. She states patient  Has gotten worse since his last visit. She stated he is unable to talk at all and needs advise from [place].

## 2019-06-27 NOTE — ED Provider Notes (Signed)
Patient seen and examined, agree with assessment and plan by the APP. Patient with progressive neurologic decline recently with numbness, weakness and ataxia at home. Has had several falls. Has paresthesias of entire body from the shoulders down. Found to be hypokalemic, MRI shows continued Cervical spinal cord disease, although no acute compression. Reviewed MRI images with radiologist and neurologist. Will admit to Hospitalist. Spoke with Dr. Toniann Fail, Hospitalist who will admit, requests additional PO Potassium.   Pollyann Savoy, MD 06/27/19 2250

## 2019-06-27 NOTE — ED Notes (Signed)
The pt returned from mri  Pot chloride drip restarted  2ml to go.  Drip running at 61ml due to there burning in his arm from the potassium

## 2019-06-27 NOTE — ED Notes (Signed)
Med given 

## 2019-06-27 NOTE — Telephone Encounter (Signed)
Spoke with patients wife.  Wife reports patients symptoms have worsened since last appointment with PCP and Neuro.  Patient unable to walk, speech is slurred and face is numb. Patient not eating and is weak.   Advised wife that patient needs to be evaluated/treated in ER. Wife agrees with plan, encouraged to call EMS for transport assistance.

## 2019-06-27 NOTE — ED Notes (Signed)
CBG Results of 125 Reported to Clydie Braun, Charity fundraiser.

## 2019-06-27 NOTE — ED Triage Notes (Addendum)
Pt to triage via [facility] from home.  C/o increased generalized weakness x 2 weeks.  Reports foul smelling urine x 4 weeks.  Hx of cervical surgery.  Reports tightness in upper arms when he tries to raise them.  Reports frequent falls.

## 2019-06-27 NOTE — ED Notes (Signed)
Iv team here 

## 2019-06-27 NOTE — ED Provider Notes (Addendum)
[facility] EMERGENCY DEPARTMENT Provider Note   CSN: [id number] Arrival date & time: 06/27/19  1711     History Chief Complaint  Patient presents with  . Weakness    Jorge Weaver. is a 54 y.o. male with a past medical history of cervical spondylosis with myelopathy, spinal stenosis, alcohol abuse, prediabetes, he is status post anterior cervical disc decompression and discectomy with fusion in on 01/16/2019 by Dr. Coletta Memos.  Prior to that the patient had some bilateral leg weakness.  He states that he was having some similar problems prior to his surgery which improved after the surgery.  He has been doing well up until 2 weeks ago when he started noticing that his arms and legs were very weak, "doing what they want to. "  Patient's wife is at bedside and states that she thinks is about been about 3 weeks.  The patient has been unable to get out of bed because he is unable to walk extremely weak.  Patient's wife states that she gave him a mask and he could not even put it on his face.  They had seen Dr. Franky Macho earlier this week who ordered an outpatient MRI of the C-spine.  His PCP ordered a brain MRI.  She states that it became too severe tonight and she had to call EMS.  She also noticed that the smell of his urine has been very strong.  The patient does admit to drinking several beers daily.  The patient's wife states that he has fallen and she has had to pick him up which she states is "not easy.  The patient did injure his right great toenail.  She is unsure of when this occurred.  HPI     Past Medical History:  Diagnosis Date  . Anemia    low iron  . Asthma    as a child  . Cervical spondylosis with myelopathy   . Fatty liver   . GERD (gastroesophageal reflux disease)   . Heart murmur    when he was younger   . Hypertension   . Pneumonia     Patient Active Problem List   Diagnosis Date Noted  . Cervical spondylosis with myelopathy and radiculopathy  01/16/2019  . Spinal stenosis of lumbar region 12/24/2018  . Gastroesophageal reflux disease 06/26/2018  . Essential hypertension 06/17/2018  . Fatty liver   . Witnessed apneic spells 03/28/2017  . Prediabetes 01/18/2017  . Bronchitis 05/21/2014  . ETOH abuse 05/21/2014  . Asthma 01/15/2013  . Asthma with exacerbation 01/15/2013  . Generalized anxiety disorder 08/24/2012  . Malaise and fatigue 08/24/2012    Past Surgical History:  Procedure Laterality Date  . ANTERIOR CERVICAL DECOMP/DISCECTOMY FUSION N/A 01/16/2019   Procedure: Cervical six corpectomy with Cervical five-seven fusion and plating;  Surgeon: Coletta Memos, MD;  Location: [facility] OR;  Service: Neurosurgery;  Laterality: N/A;  . NO PAST SURGERIES    . WISDOM TOOTH EXTRACTION    . WRIST SURGERY Left 2014   fx       Family History  Problem Relation Age of Onset  . Hypertension Mother   . Heart attack Father   . Colon cancer Sister   . Esophageal cancer Neg Hx   . Rectal cancer Neg Hx   . Stomach cancer Neg Hx     Social History   Tobacco Use  . Smoking status: Never Smoker  . Smokeless tobacco: Never Used  Substance Use Topics  . Alcohol use:  Yes    Alcohol/week: 3.0 standard drinks    Types: 3 Cans of beer per week  . Drug use: No    Home Medications Prior to Admission medications   Medication Sig Start Date End Date Taking? Authorizing Provider  amLODipine (NORVASC) 10 MG tablet Take 1 tablet (10 mg total) by mouth daily. 09/13/18   [name], DO  famotidine (PEPCID) 20 MG tablet Take 1 tablet (20 mg total) by mouth 2 (two) times daily. 06/09/19   [name], DO  levocetirizine (XYZAL) 5 MG tablet Take 1 tablet (5 mg total) by mouth every evening. 06/09/19   Wendling, [name], DO  metoprolol succinate (TOPROL-XL) 50 MG 24 hr tablet Take 1 tablet (50 mg total) by mouth daily. Take with or immediately following a meal. 09/13/18   Wendling, [name], DO  potassium chloride  (KLOR-CON 10) 10 MEQ tablet Take 2 tablets (20 mEq total) by mouth daily. Patient taking differently: Take 10 mEq by mouth 2 (two) times daily.  09/18/18   [name], DO    Allergies    Patient has no known allergies.  Review of Systems   Review of Systems Ten systems reviewed and are negative for acute change, except as noted in the HPI.   Physical Exam Updated Vital Signs BP (!) 154/120 (BP Location: Left Arm)   Pulse (!) 114   Temp 98.5 F (36.9 C) (Oral)   Resp 20   SpO2 96%   Physical Exam Vitals and nursing note reviewed.  Constitutional:      General: He is not in acute distress.    Appearance: He is well-developed. He is not diaphoretic.  HENT:     Head: Normocephalic and atraumatic.  Eyes:     General: No scleral icterus.    Conjunctiva/sclera: Conjunctivae normal.  Cardiovascular:     Rate and Rhythm: Normal rate and regular rhythm.     Heart sounds: Normal heart sounds.  Pulmonary:     Effort: Pulmonary effort is normal. No respiratory distress.     Breath sounds: Normal breath sounds.  Abdominal:     Palpations: Abdomen is soft.     Tenderness: There is no abdominal tenderness.  Musculoskeletal:     Cervical back: Normal range of motion and neck supple.     Comments: Right great toenail is partially avulsed.  Skin:    General: Skin is warm and dry.  Neurological:     Mental Status: He is alert and oriented to person, place, and time.     Cranial Nerves: No cranial nerve deficit.     Sensory: Sensory deficit present.     Motor: Weakness present.     Coordination: Coordination abnormal.     Comments: Patient with normal strength at the ankle.  He has 4 out of 5 strength with proximal lower extremity muscles.  Diminished reflexes 1+ in the bilateral patellar reflexes.  He has no myoclonus.  The upper extremities he has normal and equal bilateral grip strength.  5 out of 5 strength with flexion extension at the elbow.  Negative Hoffmann sign  bilaterally.  Hands are atrophied.  He has pronator drift on the left.  Severe dysmetria with finger-to-nose.  A reflexive upper extremities  Psychiatric:        Behavior: Behavior normal.     ED Results / Procedures / Treatments   Labs (all labs ordered are listed, but only abnormal results are displayed) Labs Reviewed  BASIC METABOLIC PANEL -  Abnormal; Notable for the following components:      Result Value   Potassium 2.2 (*)    Chloride 89 (*)    CO2 33 (*)    Glucose, Bld 122 (*)    Calcium 8.7 (*)    All other components within normal limits  CBC - Abnormal; Notable for the following components:   WBC 3.8 (*)    RBC 3.44 (*)    Hemoglobin 12.1 (*)    HCT 36.9 (*)    MCV 107.3 (*)    MCH 35.2 (*)    All other components within normal limits  URINALYSIS, ROUTINE W REFLEX MICROSCOPIC - Abnormal; Notable for the following components:   Color, Urine AMBER (*)    APPearance HAZY (*)    Bilirubin Urine SMALL (*)    Ketones, ur 5 (*)    Protein, ur 100 (*)    Bacteria, UA RARE (*)    All other components within normal limits  CBG MONITORING, ED - Abnormal; Notable for the following components:   Glucose-Capillary 125 (*)    All other components within normal limits  MAGNESIUM  TSH    EKG EKG Interpretation  Date/Time:  Friday June 27 2019 17:33:56 EDT Ventricular Rate:  108 PR Interval:  196 QRS Duration: 82 QT Interval:  336 QTC Calculation: 450 R Axis:   36 Text Interpretation: Sinus tachycardia ST & T wave abnormality, consider inferior ischemia ST & T wave abnormality, consider anterolateral ischemia Abnormal ECG agree, no STEMI criteria but diffuse ischemic changes compared to old Confirmed by Arby Barrette [phone]) on 06/27/2019 5:48:07 PM   Radiology No results found.  Procedures .Nail Removal  Date/Time: 06/27/2019 8:46 PM Performed by: Arthor Captain, PA-C Authorized by: Arthor Captain, PA-C   Consent:    Consent obtained:  Verbal   Consent  given by:  Patient   Risks discussed:  Bleeding Location:    Foot:  R big toe Pre-procedure details:    Skin preparation:  Betadine Anesthesia (see MAR for exact dosages):    Anesthesia method:  None Nail Removal:    Nail removed:  Complete   Nail bed repaired: no     Removed nail replaced and anchored: no   Post-procedure details:    Dressing:  Xeroform gauze and gauze roll   Patient tolerance of procedure:  Tolerated well, no immediate complications Comments:     Patient with partial avulsion of the right great toe.  I removed it by grabbing it with my fingers and pulling it out of the nail bed.  The patient had no pain due to severe or sensory deficits at this time.  I cleaned and applied Xeroform gauze and gauze roll.   (including critical care time)  Medications Ordered in ED Medications  sodium chloride flush (NS) 0.9 % injection 3 mL (has no administration in time range)  thiamine (B-1) injection 100 mg (has no administration in time range)  potassium chloride 10 mEq in 100 mL IVPB (has no administration in time range)  potassium chloride SA (KLOR-CON) CR tablet 40 mEq (40 mEq Oral Given 06/27/19 1901)    ED Course  I have reviewed the triage vital signs and the nursing notes.  Pertinent labs & imaging results that were available during my care of the patient were reviewed by me and considered in my medical decision making (see chart for details).  Clinical Course as of Jun 26 2020  Fri Jun 27, 2019  1919 Consulted with Dr. Wilford Corner  who recommends imaging of the brain, cspine, and lumbar spine    [AH]    Clinical Course User Index [AH] Margarita Mail, PA-C   MDM Rules/Calculators/A&P                      This is a 54 year old male with severe generalized limb weakness, inability to ambulate, discoordination.  I discussed the case with Dr. Malen Gauze of neurology who recommends imaging of the head neck and lumbar spine.  I personally ordered interpreted and reviewed labs.   Patient's urine shows no evidence of infection.  Glucose is slightly elevated.  Patient's BMP shows marked hypokalemia and hypochloridemia I personally reviewed the patient's EKG which shows significant changes including T wave inversion globally which I believe is secondary to the patient's significant hypokalemia.  No chest pain complaints and I doubt ACS.  I am concerned that the patient's weakness may be secondary to his severe hypokalemia.  He is getting oral and IV repletion.  We will need to rule out a central nervous system cause.  I have low suspicion for Guillain-Barr syndrome.  The patient was seen and shared visit with Dr. Karle Starch who will assume care of the patient.  Other differentials include compressive myelopathy. Final Clinical Impression(s) / ED Diagnoses Final diagnoses:  None    Rx / DC Orders ED Discharge Orders    None       Margarita Mail, PA-C 06/27/19 2051    Margarita Mail, PA-C 06/27/19 2052    Truddie Hidden, MD 06/28/19 1010

## 2019-06-28 ENCOUNTER — Encounter (HOSPITAL_COMMUNITY): Payer: Self-pay | Admitting: Internal Medicine

## 2019-06-28 DIAGNOSIS — I10 Essential (primary) hypertension: Secondary | ICD-10-CM | POA: Diagnosis not present

## 2019-06-28 DIAGNOSIS — K219 Gastro-esophageal reflux disease without esophagitis: Secondary | ICD-10-CM | POA: Diagnosis present

## 2019-06-28 DIAGNOSIS — M48061 Spinal stenosis, lumbar region without neurogenic claudication: Secondary | ICD-10-CM | POA: Diagnosis present

## 2019-06-28 DIAGNOSIS — R7303 Prediabetes: Secondary | ICD-10-CM | POA: Diagnosis present

## 2019-06-28 DIAGNOSIS — E876 Hypokalemia: Secondary | ICD-10-CM | POA: Diagnosis not present

## 2019-06-28 DIAGNOSIS — S91201A Unspecified open wound of right great toe with damage to nail, initial encounter: Secondary | ICD-10-CM | POA: Diagnosis present

## 2019-06-28 DIAGNOSIS — N319 Neuromuscular dysfunction of bladder, unspecified: Secondary | ICD-10-CM | POA: Diagnosis not present

## 2019-06-28 DIAGNOSIS — R471 Dysarthria and anarthria: Secondary | ICD-10-CM | POA: Diagnosis present

## 2019-06-28 DIAGNOSIS — G4700 Insomnia, unspecified: Secondary | ICD-10-CM | POA: Diagnosis not present

## 2019-06-28 DIAGNOSIS — R7401 Elevation of levels of liver transaminase levels: Secondary | ICD-10-CM | POA: Diagnosis not present

## 2019-06-28 DIAGNOSIS — D62 Acute posthemorrhagic anemia: Secondary | ICD-10-CM | POA: Diagnosis not present

## 2019-06-28 DIAGNOSIS — Z8249 Family history of ischemic heart disease and other diseases of the circulatory system: Secondary | ICD-10-CM | POA: Diagnosis not present

## 2019-06-28 DIAGNOSIS — M792 Neuralgia and neuritis, unspecified: Secondary | ICD-10-CM | POA: Diagnosis not present

## 2019-06-28 DIAGNOSIS — K760 Fatty (change of) liver, not elsewhere classified: Secondary | ICD-10-CM | POA: Diagnosis present

## 2019-06-28 DIAGNOSIS — E871 Hypo-osmolality and hyponatremia: Secondary | ICD-10-CM | POA: Diagnosis not present

## 2019-06-28 DIAGNOSIS — R296 Repeated falls: Secondary | ICD-10-CM | POA: Diagnosis present

## 2019-06-28 DIAGNOSIS — R531 Weakness: Secondary | ICD-10-CM | POA: Diagnosis present

## 2019-06-28 DIAGNOSIS — Z20822 Contact with and (suspected) exposure to covid-19: Secondary | ICD-10-CM | POA: Diagnosis present

## 2019-06-28 DIAGNOSIS — G610 Guillain-Barre syndrome: Secondary | ICD-10-CM | POA: Diagnosis present

## 2019-06-28 DIAGNOSIS — E222 Syndrome of inappropriate secretion of antidiuretic hormone: Secondary | ICD-10-CM | POA: Diagnosis present

## 2019-06-28 DIAGNOSIS — G9589 Other specified diseases of spinal cord: Secondary | ICD-10-CM | POA: Diagnosis present

## 2019-06-28 DIAGNOSIS — G723 Periodic paralysis: Secondary | ICD-10-CM | POA: Diagnosis present

## 2019-06-28 DIAGNOSIS — R32 Unspecified urinary incontinence: Secondary | ICD-10-CM | POA: Diagnosis not present

## 2019-06-28 DIAGNOSIS — K592 Neurogenic bowel, not elsewhere classified: Secondary | ICD-10-CM | POA: Diagnosis not present

## 2019-06-28 DIAGNOSIS — Z981 Arthrodesis status: Secondary | ICD-10-CM | POA: Diagnosis not present

## 2019-06-28 DIAGNOSIS — Z800 Family history of malignant neoplasm of digestive organs: Secondary | ICD-10-CM | POA: Diagnosis not present

## 2019-06-28 DIAGNOSIS — R0989 Other specified symptoms and signs involving the circulatory and respiratory systems: Secondary | ICD-10-CM | POA: Diagnosis not present

## 2019-06-28 DIAGNOSIS — R278 Other lack of coordination: Secondary | ICD-10-CM | POA: Diagnosis present

## 2019-06-28 DIAGNOSIS — F1010 Alcohol abuse, uncomplicated: Secondary | ICD-10-CM | POA: Diagnosis present

## 2019-06-28 LAB — CBC WITH DIFFERENTIAL/PLATELET
Abs Immature Granulocytes: 0.02 10*3/uL (ref 0.00–0.07)
Basophils Absolute: 0 10*3/uL (ref 0.0–0.1)
Basophils Relative: 0 %
Eosinophils Absolute: 0 10*3/uL (ref 0.0–0.5)
Eosinophils Relative: 1 %
HCT: 32.5 % — ABNORMAL LOW (ref 39.0–52.0)
Hemoglobin: 10.9 g/dL — ABNORMAL LOW (ref 13.0–17.0)
Immature Granulocytes: 0 %
Lymphocytes Relative: 25 %
Lymphs Abs: 1.2 10*3/uL (ref 0.7–4.0)
MCH: 34.9 pg — ABNORMAL HIGH (ref 26.0–34.0)
MCHC: 33.5 g/dL (ref 30.0–36.0)
MCV: 104.2 fL — ABNORMAL HIGH (ref 80.0–100.0)
Monocytes Absolute: 0.6 10*3/uL (ref 0.1–1.0)
Monocytes Relative: 12 %
Neutro Abs: 3 10*3/uL (ref 1.7–7.7)
Neutrophils Relative %: 62 %
Platelets: 363 10*3/uL (ref 150–400)
RBC: 3.12 MIL/uL — ABNORMAL LOW (ref 4.22–5.81)
RDW: 13.4 % (ref 11.5–15.5)
WBC: 4.8 10*3/uL (ref 4.0–10.5)
nRBC: 0 % (ref 0.0–0.2)

## 2019-06-28 LAB — COMPREHENSIVE METABOLIC PANEL
ALT: 35 U/L (ref 0–44)
AST: 51 U/L — ABNORMAL HIGH (ref 15–41)
Albumin: 3 g/dL — ABNORMAL LOW (ref 3.5–5.0)
Alkaline Phosphatase: 75 U/L (ref 38–126)
Anion gap: 10 (ref 5–15)
BUN: 6 mg/dL (ref 6–20)
CO2: 35 mmol/L — ABNORMAL HIGH (ref 22–32)
Calcium: 8.5 mg/dL — ABNORMAL LOW (ref 8.9–10.3)
Chloride: 92 mmol/L — ABNORMAL LOW (ref 98–111)
Creatinine, Ser: 0.79 mg/dL (ref 0.61–1.24)
GFR calc Af Amer: >60 mL/min (ref >60)
GFR calc non Af Amer: >60 mL/min (ref >60)
Glucose, Bld: 119 mg/dL — ABNORMAL HIGH (ref 70–99)
Potassium: 2.5 mmol/L — CL (ref 3.5–5.1)
Sodium: 137 mmol/L (ref 135–145)
Total Bilirubin: 1.2 mg/dL (ref 0.3–1.2)
Total Protein: 6.8 g/dL (ref 6.5–8.1)

## 2019-06-28 LAB — RETICULOCYTES
Immature Retic Fract: 16.4 % — ABNORMAL HIGH (ref 2.3–15.9)
RBC.: 3.12 MIL/uL — ABNORMAL LOW (ref 4.22–5.81)
Retic Count, Absolute: 66.8 10*3/uL (ref 19.0–186.0)
Retic Ct Pct: 2.1 % (ref 0.4–3.1)

## 2019-06-28 LAB — POTASSIUM: Potassium: 3.5 mmol/L (ref 3.5–5.1)

## 2019-06-28 LAB — SARS CORONAVIRUS 2 (TAT 6-24 HRS): SARS Coronavirus 2: NEGATIVE

## 2019-06-28 LAB — IRON AND TIBC
Iron: 40 ug/dL — ABNORMAL LOW (ref 45–182)
Saturation Ratios: 17 % — ABNORMAL LOW (ref 17.9–39.5)
TIBC: 235 ug/dL — ABNORMAL LOW (ref 250–450)
UIBC: 195 ug/dL

## 2019-06-28 LAB — FERRITIN: Ferritin: 803 ng/mL — ABNORMAL HIGH (ref 24–336)

## 2019-06-28 LAB — CORTISOL: Cortisol, Plasma: 12 ug/dL

## 2019-06-28 LAB — FOLATE: Folate: 3.2 ng/mL — ABNORMAL LOW (ref >5.9)

## 2019-06-28 LAB — VITAMIN B12: Vitamin B-12: 571 pg/mL (ref 180–914)

## 2019-06-28 LAB — TSH
TSH: 1.501 u[IU]/mL (ref 0.350–4.500)
TSH: 1.777 u[IU]/mL (ref 0.350–4.500)

## 2019-06-28 LAB — TROPONIN I (HIGH SENSITIVITY): Troponin I (High Sensitivity): 7 ng/L (ref <18)

## 2019-06-28 MED ORDER — SODIUM CHLORIDE 0.9 % IV SOLN: 25.0000 mg | Freq: Four times a day (QID) | INTRAVENOUS | Status: DC | PRN

## 2019-06-28 MED ORDER — POTASSIUM CHLORIDE CRYS ER 20 MEQ PO TBCR
40.0000 meq | EXTENDED_RELEASE_TABLET | Freq: Two times a day (BID) | ORAL | Status: AC
  Administered 2019-06-28 (×2): 40 meq via ORAL
  Filled 2019-06-28 (×3): qty 2

## 2019-06-28 MED ORDER — MAGNESIUM SULFATE IN D5W 1-5 GM/100ML-% IV SOLN
1.0000 g | Freq: Once | INTRAVENOUS | Status: AC
  Administered 2019-06-28: 1 g via INTRAVENOUS
  Filled 2019-06-28: qty 100

## 2019-06-28 MED ORDER — LACTATED RINGERS IV SOLN: INTRAVENOUS | Status: AC

## 2019-06-28 MED ORDER — HYDRALAZINE HCL 20 MG/ML IJ SOLN
10.0000 mg | INTRAMUSCULAR | Status: DC | PRN
  Administered 2019-06-28: 10 mg via INTRAVENOUS
  Filled 2019-06-28: qty 1

## 2019-06-28 MED ORDER — ONDANSETRON HCL 4 MG/2ML IJ SOLN: 4.0000 mg | Freq: Four times a day (QID) | INTRAMUSCULAR | Status: DC | PRN

## 2019-06-28 MED ORDER — LIP MEDEX EX OINT
TOPICAL_OINTMENT | CUTANEOUS | Status: DC | PRN
  Filled 2019-06-28: qty 7

## 2019-06-28 MED ORDER — METOPROLOL TARTRATE 100 MG PO TABS
100.0000 mg | ORAL_TABLET | Freq: Two times a day (BID) | ORAL | Status: DC
  Administered 2019-06-28 – 2019-07-07 (×19): 100 mg via ORAL
  Filled 2019-06-28 (×19): qty 1

## 2019-06-28 MED ORDER — ONDANSETRON HCL 4 MG PO TABS: 4.0000 mg | ORAL_TABLET | Freq: Four times a day (QID) | ORAL | Status: DC | PRN

## 2019-06-28 MED ORDER — THIAMINE HCL 100 MG PO TABS
100.0000 mg | ORAL_TABLET | Freq: Every day | ORAL | Status: DC
  Administered 2019-06-28 – 2019-06-29 (×2): 100 mg via ORAL
  Filled 2019-06-28: qty 1

## 2019-06-28 MED ORDER — DIPHENHYDRAMINE HCL 25 MG PO CAPS
25.0000 mg | ORAL_CAPSULE | Freq: Four times a day (QID) | ORAL | Status: DC | PRN
  Administered 2019-06-29: 25 mg via ORAL
  Filled 2019-06-28: qty 1

## 2019-06-28 MED ORDER — GABAPENTIN 600 MG PO TABS
300.0000 mg | ORAL_TABLET | Freq: Three times a day (TID) | ORAL | Status: DC
  Administered 2019-06-28 – 2019-07-07 (×29): 300 mg via ORAL
  Filled 2019-06-28 (×29): qty 1

## 2019-06-28 MED ORDER — AMLODIPINE BESYLATE 10 MG PO TABS
10.0000 mg | ORAL_TABLET | Freq: Every day | ORAL | Status: DC
  Administered 2019-06-28 – 2019-07-07 (×10): 10 mg via ORAL
  Filled 2019-06-28 (×10): qty 1

## 2019-06-28 MED ORDER — THIAMINE HCL 100 MG/ML IJ SOLN: 100.0000 mg | Freq: Every day | INTRAMUSCULAR | Status: DC

## 2019-06-28 MED ORDER — ZOLPIDEM TARTRATE 5 MG PO TABS
5.0000 mg | ORAL_TABLET | Freq: Every evening | ORAL | Status: DC | PRN
  Administered 2019-06-28 – 2019-07-05 (×6): 5 mg via ORAL
  Filled 2019-06-28 (×6): qty 1

## 2019-06-28 MED ORDER — FOLIC ACID 1 MG PO TABS
1.0000 mg | ORAL_TABLET | Freq: Every day | ORAL | Status: DC
  Administered 2019-06-28 – 2019-07-07 (×10): 1 mg via ORAL
  Filled 2019-06-28 (×10): qty 1

## 2019-06-28 MED ORDER — POTASSIUM CHLORIDE 10 MEQ/100ML IV SOLN
10.0000 meq | INTRAVENOUS | Status: AC
  Administered 2019-06-28 (×6): 10 meq via INTRAVENOUS
  Filled 2019-06-28: qty 100

## 2019-06-28 MED ORDER — THIAMINE HCL 100 MG PO TABS
100.0000 mg | ORAL_TABLET | Freq: Every day | ORAL | Status: DC
  Administered 2019-06-28: 100 mg via ORAL
  Filled 2019-06-28: qty 1

## 2019-06-28 MED ORDER — HEPARIN SODIUM (PORCINE) 5000 UNIT/ML IJ SOLN
5000.0000 [IU] | Freq: Three times a day (TID) | INTRAMUSCULAR | Status: DC
  Administered 2019-06-28 – 2019-06-30 (×6): 5000 [IU] via SUBCUTANEOUS
  Filled 2019-06-28 (×6): qty 1

## 2019-06-28 MED ORDER — FOLIC ACID 5 MG/ML IJ SOLN: 1.0000 mg | Freq: Every day | INTRAMUSCULAR | Status: DC

## 2019-06-28 NOTE — Progress Notes (Signed)
PROGRESS NOTE                                                                                                                                                                                                             Patient Demographics:    Jorge Weaver, is a 54 y.o. male, DOB - Apr 24, 1965, WLN:[id number]  Admit date - 06/27/2019   Admitting Physician Eduard Clos, MD  Outpatient Primary MD for the patient is [name], DO  LOS - 0  Chief Complaint  Patient presents with  . Weakness       Brief Narrative  - 54 y.o. male with history of hypertension previous alcohol abuse which patient states has stopped drinking for almost a month now who has had a surgery in October 2024 cervical 6 corpectomy and C5 7 arthrodesis by Dr. Franky Macho presents to the ER because of increasing tightness of the extremities difficulty to move difficulty walking and increasing numbness of the upper and lower extremities.  Denies any nausea vomiting diarrhea headache chest pain.  Has been having recurrent falls.  In the ER his MRI brain and C-spine were nonacute, this was discussed by the admitting physician with neurologist on-call at night Dr. Jerrell Belfast, in the morning I discussed his MRI findings with neurosurgeon Dr. Maisie Fus.  No surgical intervention or medical intervention needed according to the specialist.  Incidentally he was found to have life-threatening low severe hypokalemia along with ongoing weakness and admitted to the hospital.   Subjective:    Jasper Loser today has, No headache, No chest pain, No abdominal pain - No Nausea, No new weakness tingling or numbness, No Cough - SOB.  Continues to have generalized numbness and cramps, left arm and left side of the face worse.   Assessment  & Plan :     1.  Generalized subacute weakness worse in the left upper extremity and left side of the face ongoing for several weeks in a patient with history of C-spine fusion surgery.  Case  discussed by admitting physician with neurologist Dr. Jerrell Belfast and me with neurosurgeon Dr. Maisie Fus.  No neurological intervention needed.  His symptoms most likely are due to severe hypokalemia causing picture close to periodic paralysis.  His potassium will be aggressively replaced, PT OT and monitor.  We will also check random cortisol in the setting of hypertension along with thiamine level given his history of alcohol abuse.  2.  Multiple falls at home with lip and tongue injury during  a fall along with avulsed right big toe nail.  Due to muscular weakness from #1 above, supportive care and monitor.  3. HTN - on Norvasc added Lopressor for better control.  4.  History of alcohol abuse.  Counseled to quit.  Placed on folic acid and thiamine.  Last drink 1 month ago.    Family Communication  :  Wife [name] [phone], 06/28/19  Code Status : Full  Disposition Plan  :  Stay in the hospital for SEVERE Hypokalemia with periodic paralysis  Consults  :  Neurology Dr Wilford Corner and N. Surgery Dr Maisie Fus over the phone - 06/28/19  Procedures  :    MRI Brain - C Spine -   1. 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 2. Status post ACDF at C5-C6 and C6-C7. Resolved spinal stenosis at C6-C7, and regressed although not resolved abnormal spinal cord signal and enhancement centered at that level. 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 3. Mild cervical spinal stenosis elsewhere, including some residual cord mass effect related to C5 endplate spurring, but no other abnormal cord signal is identified. 4. Degenerative neural foraminal stenosis, up to moderate at the left C5 and C8 nerve levels, and severe at the left C6 nerve level.   DVT Prophylaxis  :   Heparin    Lab Results  Component Value Date   PLT 363 06/28/2019    Diet :  Diet Order            Diet Heart Room service appropriate? Yes; Fluid consistency: Thin  Diet  effective now               Inpatient Medications Scheduled Meds: . amLODipine  10 mg Oral Daily  . potassium chloride  40 mEq Oral BID  . thiamine  100 mg Oral Daily   Continuous Infusions: . lactated ringers    . magnesium sulfate bolus IVPB 1 g (06/28/19 0959)  . potassium chloride 10 mEq (06/28/19 1000)   PRN Meds:.hydrALAZINE, [DISCONTINUED] ondansetron **OR** ondansetron (ZOFRAN) IV  Antibiotics  :   Anti-infectives (From admission, onward)   None        Objective:   Vitals:   06/28/19 0500 06/28/19 0603 06/28/19 0642 06/28/19 0730  BP:  (!) 186/121 (!) 156/117 (!) 157/114  Pulse:    (!) 118  Resp:    18  Temp:    98.8 F (37.1 C)  TempSrc:    Oral  SpO2: 100%   98%  Weight:      Height:        SpO2: 98 %  Wt Readings from Last 3 Encounters:  06/28/19 112.4 kg  06/16/19 122.5 kg  06/09/19 122.2 kg     Intake/Output Summary (Last 24 hours) at 06/28/2019 1003 Last data filed at 06/28/2019 0800 Gross per 24 hour  Intake 240 ml  Output --  Net 240 ml     Physical Exam  Awake Alert, No new F.N deficits, generalized weakness in all 4 extremities with strength 5/5 but somewhat weaker in the left arm as compared to other limbs but this is chronic according to patient, mild left-sided facial droop which is also been present for a few weeks according to the wife [name].AT,PERRAL Supple Neck,No JVD, No cervical lymphadenopathy appriciated.  Symmetrical Chest wall movement, Good air movement bilaterally, CTAB RRR,No Gallops,Rubs or new Murmurs, No Parasternal Heave +ve B.Sounds, Abd Soft, No tenderness, No organomegaly appriciated, No rebound - guarding or rigidity.  No Cyanosis, Clubbing or edema, No new Rash or bruise      Data Review:    Recent Labs  Lab 06/27/19 1735 06/28/19 0551  WBC 3.8* 4.8  HGB 12.1* 10.9*  HCT 36.9* 32.5*  PLT 374 363  MCV 107.3* 104.2*  MCH 35.2* 34.9*  MCHC 32.8 33.5  RDW 13.8 13.4  LYMPHSABS  --  1.2  MONOABS  --   0.6  EOSABS  --  0.0  BASOSABS  --  0.0    Recent Labs  Lab 06/27/19 1735 06/27/19 1846 06/27/19 2346 06/28/19 0551  NA 136  --   --  137  K 2.2*  --   --  2.5*  CL 89*  --   --  92*  CO2 33*  --   --  35*  GLUCOSE 122*  --   --  119*  BUN 6  --   --  6  CREATININE 0.82  --   --  0.79  CALCIUM 8.7*  --   --  8.5*  AST  --   --   --  51*  ALT  --   --   --  35  ALKPHOS  --   --   --  75  BILITOT  --   --   --  1.2  ALBUMIN  --   --   --  3.0*  MG  --  1.7  --   --   TSH  --   --  1.777 1.501    Recent Labs  Lab 06/28/19 0042  SARSCOV2NAA NEGATIVE    ------------------------------------------------------------------------------------------------------------------ No results for input(s): CHOL, HDL, LDLCALC, TRIG, CHOLHDL, LDLDIRECT in the last 72 hours.  Lab Results  Component Value Date   HGBA1C 6.1 08/07/2017   ------------------------------------------------------------------------------------------------------------------ Recent Labs    06/28/19 0551  TSH 1.501   ------------------------------------------------------------------------------------------------------------------ Recent Labs    06/28/19 0551  VITAMINB12 571  FOLATE 3.2*  FERRITIN 803*  TIBC 235*  IRON 40*  RETICCTPCT 2.1    Coagulation profile No results for input(s): INR, PROTIME in the last 168 hours.  No results for input(s): DDIMER in the last 72 hours.  Cardiac Enzymes No results for input(s): CKMB, TROPONINI, MYOGLOBIN in the last 168 hours.  Invalid input(s): CK ------------------------------------------------------------------------------------------------------------------ No results found for: BNP  Micro Results Recent Results (from the past 240 hour(s))  SARS CORONAVIRUS 2 (TAT 6-24 HRS) Nasopharyngeal Nasopharyngeal Swab     Status: None   Collection Time: 06/28/19 12:42 AM   Specimen: Nasopharyngeal Swab  Result Value Ref Range Status   SARS Coronavirus 2 NEGATIVE  NEGATIVE Final    Comment: (NOTE) SARS-CoV-2 target nucleic acids are NOT DETECTED. The SARS-CoV-2 RNA is generally detectable in upper and lower respiratory specimens during the acute phase of infection. Negative results do not preclude SARS-CoV-2 infection, do not rule out co-infections with other pathogens, and should not be used as the sole basis for treatment or other patient management decisions. Negative results must be combined with clinical observations, patient history, and epidemiological information. The expected result is Negative. Fact Sheet for Patients: HairSlick.nohttps://www.fda.gov/media/138098/download Fact Sheet for Healthcare Providers: quierodirigir.comhttps://www.fda.gov/media/138095/download This test is not yet approved or cleared by the [place] FDA and  has been authorized for detection and/or diagnosis of SARS-CoV-2 by FDA under an Emergency Use Authorization (EUA). This EUA will remain  in effect (meaning this test can be used) for the duration of the COVID-19 declaration under Section 56 4(b)(1) of the Act,  21 U.S.C. section 360bbb-3(b)(1), unless the authorization is terminated or revoked sooner. Performed at [facility], [address], [place], [place] [address]     Radiology Reports MR Brain W and Wo Contrast  Result Date: 06/27/2019 CLINICAL DATA:  54 year old male with increasing neurologic deficits, involuntary twitches and spasms. Status post cervical spine ACDF on 01/16/2019. EXAM: MRI HEAD WITHOUT AND WITH CONTRAST TECHNIQUE: Multiplanar, multiecho pulse sequences of the brain and surrounding structures were obtained without and with intravenous contrast. CONTRAST:  52mL GADAVIST GADOBUTROL 1 MMOL/ML IV SOLN COMPARISON:  No prior head MRI.  Head CT 10/15/2013. FINDINGS: Brain: Cerebral volume is similar to that in 2015. No restricted diffusion to suggest acute infarction. No midline shift, mass effect, evidence of mass lesion, ventriculomegaly, extra-axial  collection or acute intracranial hemorrhage. Cervicomedullary junction and pituitary are within normal limits. Largely normal for age gray and white matter signal, minimal scattered nonspecific cerebral white matter T2 and FLAIR hyperintensity. No cortical encephalomalacia or chronic cerebral blood products. No abnormal enhancement identified. No dural thickening. Vascular: Major intracranial vascular flow voids are preserved. The major dural venous sinuses are enhancing and appear to be patent. Skull and upper cervical spine: Cervical spine details are reported separately. Visualized bone marrow signal is within normal limits. Sinuses/Orbits: Negative orbits. Paranasal sinuses and mastoids are stable and well pneumatized. Other: Scalp and face soft tissues appear negative. There is a tiny right nasopharyngeal retention cyst, inconsequential. IMPRESSION: No acute intracranial abnormality and largely unremarkable for age MRI appearance of the brain. Electronically Signed   By: Odessa Fleming M.D.   On: 06/27/2019 22:32   MR Cervical Spine W or Wo Contrast  Result Date: 06/27/2019 CLINICAL DATA:  54 year old male with increasing neurologic deficits, involuntary twitches and spasms. Status post cervical spine ACDF on 01/16/2019. EXAM: MRI CERVICAL SPINE WITHOUT AND WITH CONTRAST TECHNIQUE: Multiplanar and multiecho pulse sequences of the cervical spine, to include the craniocervical junction and cervicothoracic junction, were obtained without and with intravenous contrast. CONTRAST:  59mL GADAVIST GADOBUTROL 1 MMOL/ML IV SOLN COMPARISON:  Preoperative cervical spine MRI with contrast on 01/16/2019. And outside preoperative cervical spine without contrast 01/02/2019, now available on [employer]. FINDINGS: Alignment: Straightening of cervical lordosis, less reversal compared to October. There may be mild residual retrolisthesis of C5 on C6. Vertebrae: Mild hardware susceptibility artifact from C5-C6 and C6-C7 ACDF, new from  prior MRI. No marrow edema or evidence of acute osseous abnormality. Background bone marrow signal within normal limits. Cord: The spinal cord remains abnormal at C6-C7, although the extent of abnormal cord T2 signal is regressed compared to 01/02/2019, where the T2 abnormality continued to the C7-T1 level. Patchy abnormal T2 signal is now confined to the C6-C7 level, and on axial images appears to spare only the most peripheral cord white matter (series 9, image 33). The cord also appears less expanded. And following contrast there is only a faint 5 mm focus of residual enhancement (9 mm and more intensely enhancing previously). Above and below that level cord signal appears to remain normal. No other abnormal intradural enhancement. No dural thickening. Posterior Fossa, vertebral arteries, paraspinal tissues: Cervicomedullary junction is within normal limits. Preserved major vascular flow voids in the neck. Negative visible neck soft tissues. Disc levels: There is a congenital degree of cervical spinal canal narrowing related to short pedicles, with superimposed degenerative changes: C2-C3: Mild disc bulge and endplate spurring plus ligament flavum hypertrophy. Mild spinal stenosis but no cord mass effect (series 10, image  6). C3-C4: Mild disc bulge and ligament flavum hypertrophy. Mild spinal stenosis but no cord mass effect. C4-C5: Circumferential disc bulge and mild endplate spurring. Less ligament flavum hypertrophy here. Mild facet hypertrophy. Mild spinal stenosis. No cord mass effect. Mild to moderate C5 foraminal stenosis greater on the left. C5-C6: Interval ACDF. But there does appear to be residual spinal stenosis with mild cord mass effect at the C5 inferior endplate level (series 11, image 27). No cord signal abnormality. Endplate spurring results in severe left and mild right C6 foraminal stenosis (same image). C6-C7: Interval ACDF. Ligament flavum hypertrophy but no significant spinal stenosis.  Foraminal patency also appears improved, mild residual stenosis on the right. C7-T1: Mostly foraminal disc bulge and endplate spurring. No spinal stenosis. Moderate left and mild right C8 foraminal stenosis. No upper thoracic spinal stenosis. IMPRESSION: 1. 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 2. Status post ACDF at C5-C6 and C6-C7. Resolved spinal stenosis at C6-C7, and regressed although not resolved abnormal spinal cord signal and enhancement centered at that level. 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 3. Mild cervical spinal stenosis elsewhere, including some residual cord mass effect related to C5 endplate spurring, but no other abnormal cord signal is identified. 4. Degenerative neural foraminal stenosis, up to moderate at the left C5 and C8 nerve levels, and severe at the left C6 nerve level. Preliminary results of this exam were discussed by telephone with Dr. Mali Sheldon in the ED. Electronically Signed   By: Genevie Ann M.D.   On: 06/27/2019 22:47   MR Lumbar Spine W Wo Contrast  Result Date: 06/27/2019 CLINICAL DATA:  54 year old male with increasing neurologic deficits, involuntary twitches and spasms. Status post cervical spine ACDF on 01/16/2019. EXAM: MRI LUMBAR SPINE WITHOUT AND WITH CONTRAST TECHNIQUE: Multiplanar and multiecho pulse sequences of the lumbar spine were obtained without and with intravenous contrast. CONTRAST:  58mL GADAVIST GADOBUTROL 1 MMOL/ML IV SOLN in conjunction with contrast enhanced imaging of the head and cervical spine reported separately. COMPARISON:  [facility] Lumbar MRIs 04/19/2019, 12/07/2018 FINDINGS: Segmentation:  Normal. Alignment: Stable degenerative appearing retrolisthesis at L5-S1, straightening of lumbar lordosis elsewhere. There is also mild chronic retrolisthesis of L2 on L3. Vertebrae: No marrow edema or evidence of acute osseous abnormality.  Visualized bone marrow signal is within normal limits. Intact visible sacrum and SI joints. Conus medullaris and cauda equina: Conus extends to the T12-L1 level. No lower spinal cord or conus signal abnormality. No abnormal intradural enhancement. No dural thickening. Paraspinal and other soft tissues: Stable and negative; benign appearing right renal midpole cyst. Disc levels: Since 04/19/2019 lower thoracic and lumbar thecal sac patency appears mildly improved above L4-L5 due to regression of epidural lipomatosis. At L2-L3 circumferential disc bulge with small posterior annular fissure and mild posterior element hypertrophy persist. Mild residual spinal stenosis at that level, improved from January. L4-L5: Persistent mild to moderate spinal stenosis in part due to epidural lipomatosis. Circumferential disc bulge, progressed right foraminal annular fissure since January (series 8 image 28). Moderate facet hypertrophy with degenerative facet joint fluid. No convincing lateral recess stenosis. Moderate right L4 foraminal stenosis. L5-S1: Continued epidural lipomatosis. Chronic retrolisthesis and disc space loss with circumferential disc osteophyte complex and moderate posterior element hypertrophy. Stable spinal and mild lateral recess stenosis. Stable moderate to severe left and moderate right L5 foraminal stenosis. IMPRESSION: 1. Lumbar thecal sac patency has improved since the January MRI at all levels above L4-L5, due  to regressed epidural lipomatosis. 2. Chronic disc degeneration at L4-L5 has progressed in the form of new right foraminal annular fissure. Moderate right L4 foraminal stenosis. Mild to moderate multifactorial spinal stenosis is stable. 3. Advanced chronic L5-S1 degeneration is stable with mild spinal and lateral recess stenosis, and moderate to severe L5 foraminal stenosis greater on the left. Electronically Signed   By: Odessa Fleming M.D.   On: 06/27/2019 22:54    Time Spent in minutes   30   Susa Raring M.D on 06/28/2019 at 10:03 AM  To page go to [email or web address] - password [facility]

## 2019-06-28 NOTE — Plan of Care (Signed)

## 2019-06-28 NOTE — ED Notes (Signed)
Please call pt wife when he goes to a room

## 2019-06-28 NOTE — Progress Notes (Signed)
MD notified of potassium of 2.5.

## 2019-06-28 NOTE — H&P (Addendum)
History and Physical    Jorge Caroline Morerayton Jr. ZOX:[id number] DOB: [birth year] DOA: 06/27/2019  PCP: Sharlene DoryWendling, Nicholas Paul, DO  Patient coming from: Home.  Chief Complaint: Increasing weakness with numbness of the extremities.  HPI: Jorge GladJoe Shuford Jr. is a 54 y.o. male with history of hypertension previous alcohol abuse which patient states has stopped drinking for almost a month now who has had a surgery in October 2024 cervical 6 corpectomy and C5 7 arthrodesis by Dr. Franky Machoabbell presents to the ER because of increasing tightness of the extremities difficulty to move difficulty walking and increasing numbness of the upper and lower extremities.  Denies any nausea vomiting diarrhea headache chest pain.  Has been having recurrent falls.  ED Course: In the ER on exam patient has good deep tendon reflexes.  Is able to move all extremities but has some dysdiadochokinesia.  Labs show severe hypokalemia of 2.2 magnesium was normal hemoglobin was 12.1.  Microcytic picture.  TSH is normal.  Covid test negative.  UA was unremarkable.  EKG showed normal sinus rhythm with nonspecific changes.  ER physician discussed with on-call neurologist who recommended getting MRI brain MRI C-spine and L-spine.  MRI C-spine showed signs of myelomalacia.  Patient was given at least 120 mg of potassium orally and 1 dose of IV KCl and admitted for further management.  Review of Systems: As per HPI, rest all negative.   Past Medical History:  Diagnosis Date  . Anemia    low iron  . Asthma    as a child  . Cervical spondylosis with myelopathy   . Fatty liver   . GERD (gastroesophageal reflux disease)   . Heart murmur    when he was younger   . Hypertension   . Pneumonia     Past Surgical History:  Procedure Laterality Date  . ANTERIOR CERVICAL DECOMP/DISCECTOMY FUSION N/A 01/16/2019   Procedure: Cervical six corpectomy with Cervical five-seven fusion and plating;  Surgeon: Coletta Memosabbell, Kyle, MD;  [facility];  Service:  Neurosurgery;  Laterality: N/A;  . NO PAST SURGERIES    . WISDOM TOOTH EXTRACTION    . WRIST SURGERY Left 2014   fx     reports that he has never smoked. He has never used smokeless tobacco. He reports current alcohol use of about 3.0 standard drinks of alcohol per week. He reports that he does not use drugs.  No Known Allergies  Family History  Problem Relation Age of Onset  . Hypertension Mother   . Heart attack Father   . Colon cancer Sister   . Esophageal cancer Neg Hx   . Rectal cancer Neg Hx   . Stomach cancer Neg Hx     Prior to Admission medications   Medication Sig Start Date End Date Taking? Authorizing Provider  acetaminophen (TYLENOL) 500 MG tablet Take 1,000 mg by mouth See admin instructions. Take 1,000 mg by mouth two to five times a day as needed for pain   Yes [provider]  amLODipine (NORVASC) 10 MG tablet Take 1 tablet (10 mg total) by mouth daily. 09/13/18  Yes Sharlene DoryWendling, Nicholas Paul, DO  famotidine (PEPCID) 20 MG tablet Take 1 tablet (20 mg total) by mouth 2 (two) times daily. Patient not taking: Reported on 06/27/2019 06/09/19   Sharlene DoryWendling, Nicholas Paul, DO  levocetirizine (XYZAL) 5 MG tablet Take 1 tablet (5 mg total) by mouth every evening. Patient not taking: Reported on 06/27/2019 06/09/19   Sharlene DoryWendling, Nicholas Paul, DO  metoprolol succinate (TOPROL-XL)  50 MG 24 hr tablet Take 1 tablet (50 mg total) by mouth daily. Take with or immediately following a meal. Patient not taking: Reported on 06/27/2019 09/13/18   [name], DO  potassium chloride (KLOR-CON 10) 10 MEQ tablet Take 2 tablets (20 mEq total) by mouth daily. Patient not taking: Reported on 06/27/2019 09/18/18   [name], DO    Physical Exam: Constitutional: Moderately built and nourished. Vitals:   06/27/19 2045 06/27/19 2237 06/27/19 2245 06/27/19 2315  BP: (!) 177/120 (!) 157/115 (!) 182/120 (!) 168/132  Pulse: 92  94 100  Resp: 15 17 17 15   Temp:      TempSrc:       SpO2: 95%  98% 99%   Eyes: Anicteric no pallor. ENMT: No discharge from the ears eyes nose or mouth. Neck: No mass felt.  No neck rigidity. Respiratory: No rhonchi or crepitations. Cardiovascular: S1-S2 heard. Abdomen: Soft nontender bowel sounds present. Musculoskeletal: No edema. Skin: No rash. Neurologic: Alert awake oriented to time place and person.  Has some dysdiadochokinesia of the upper extremities.  Tremulous of the extremities. Psychiatric: Appears normal.   Labs on Admission: I have personally reviewed following labs and imaging studies  CBC: Recent Labs  Lab 06/27/19 1735  WBC 3.8*  HGB 12.1*  HCT 36.9*  MCV 107.3*  PLT 374   Basic Metabolic Panel: Recent Labs  Lab 06/27/19 1735 06/27/19 1846  NA 136  --   K 2.2*  --   CL 89*  --   CO2 33*  --   GLUCOSE 122*  --   BUN 6  --   CREATININE 0.82  --   CALCIUM 8.7*  --   MG  --  1.7   GFR: Estimated Creatinine Clearance: 133.6 mL/min (by C-G formula based on SCr of 0.82 mg/dL). Liver Function Tests: No results for input(s): AST, ALT, ALKPHOS, BILITOT, PROT, ALBUMIN in the last 168 hours. No results for input(s): LIPASE, AMYLASE in the last 168 hours. No results for input(s): AMMONIA in the last 168 hours. Coagulation Profile: No results for input(s): INR, PROTIME in the last 168 hours. Cardiac Enzymes: No results for input(s): CKTOTAL, CKMB, CKMBINDEX, TROPONINI in the last 168 hours. BNP (last 3 results) No results for input(s): PROBNP in the last 8760 hours. HbA1C: No results for input(s): HGBA1C in the last 72 hours. CBG: Recent Labs  Lab 06/27/19 1735  GLUCAP 125*   Lipid Profile: No results for input(s): CHOL, HDL, LDLCALC, TRIG, CHOLHDL, LDLDIRECT in the last 72 hours. Thyroid Function Tests: No results for input(s): TSH, T4TOTAL, FREET4, T3FREE, THYROIDAB in the last 72 hours. Anemia Panel: No results for input(s): VITAMINB12, FOLATE, FERRITIN, TIBC, IRON, RETICCTPCT in the last 72  hours. Urine analysis:    Component Value Date/Time   COLORURINE AMBER (A) 06/27/2019 1918   APPEARANCEUR HAZY (A) 06/27/2019 1918   LABSPEC 1.029 06/27/2019 1918   PHURINE 7.0 06/27/2019 1918   GLUCOSEU NEGATIVE 06/27/2019 1918   HGBUR NEGATIVE 06/27/2019 1918   BILIRUBINUR SMALL (A) 06/27/2019 1918   KETONESUR 5 (A) 06/27/2019 1918   PROTEINUR 100 (A) 06/27/2019 1918   NITRITE NEGATIVE 06/27/2019 1918   LEUKOCYTESUR NEGATIVE 06/27/2019 1918   Sepsis Labs: @LABRCNTIP (procalcitonin:4,lacticidven:4) )No results found for this or any previous visit (from the past 240 hour(s)).   Radiological Exams on Admission: MR Brain W and Wo Contrast  Result Date: 06/27/2019 CLINICAL DATA:  54 year old male with increasing neurologic deficits, involuntary twitches and spasms. Status  post cervical spine ACDF on 01/16/2019. EXAM: MRI HEAD WITHOUT AND WITH CONTRAST TECHNIQUE: Multiplanar, multiecho pulse sequences of the brain and surrounding structures were obtained without and with intravenous contrast. CONTRAST:  62mL GADAVIST GADOBUTROL 1 MMOL/ML IV SOLN COMPARISON:  No prior head MRI.  Head CT 10/15/2013. FINDINGS: Brain: Cerebral volume is similar to that in 2015. No restricted diffusion to suggest acute infarction. No midline shift, mass effect, evidence of mass lesion, ventriculomegaly, extra-axial collection or acute intracranial hemorrhage. Cervicomedullary junction and pituitary are within normal limits. Largely normal for age gray and white matter signal, minimal scattered nonspecific cerebral white matter T2 and FLAIR hyperintensity. No cortical encephalomalacia or chronic cerebral blood products. No abnormal enhancement identified. No dural thickening. Vascular: Major intracranial vascular flow voids are preserved. The major dural venous sinuses are enhancing and appear to be patent. Skull and upper cervical spine: Cervical spine details are reported separately. Visualized bone marrow signal is  within normal limits. Sinuses/Orbits: Negative orbits. Paranasal sinuses and mastoids are stable and well pneumatized. Other: Scalp and face soft tissues appear negative. There is a tiny right nasopharyngeal retention cyst, inconsequential. IMPRESSION: No acute intracranial abnormality and largely unremarkable for age MRI appearance of the brain. Electronically Signed   By: Genevie Ann M.D.   On: 06/27/2019 22:32   MR Cervical Spine W or Wo Contrast  Result Date: 06/27/2019 CLINICAL DATA:  54 year old male with increasing neurologic deficits, involuntary twitches and spasms. Status post cervical spine ACDF on 01/16/2019. EXAM: MRI CERVICAL SPINE WITHOUT AND WITH CONTRAST TECHNIQUE: Multiplanar and multiecho pulse sequences of the cervical spine, to include the craniocervical junction and cervicothoracic junction, were obtained without and with intravenous contrast. CONTRAST:  60mL GADAVIST GADOBUTROL 1 MMOL/ML IV SOLN COMPARISON:  Preoperative cervical spine MRI with contrast on 01/16/2019. And outside preoperative cervical spine without contrast 01/02/2019, now available on [facility]. FINDINGS: Alignment: Straightening of cervical lordosis, less reversal compared to October. There may be mild residual retrolisthesis of C5 on C6. Vertebrae: Mild hardware susceptibility artifact from C5-C6 and C6-C7 ACDF, new from prior MRI. No marrow edema or evidence of acute osseous abnormality. Background bone marrow signal within normal limits. Cord: The spinal cord remains abnormal at C6-C7, although the extent of abnormal cord T2 signal is regressed compared to 01/02/2019, where the T2 abnormality continued to the C7-T1 level. Patchy abnormal T2 signal is now confined to the C6-C7 level, and on axial images appears to spare only the most peripheral cord white matter (series 9, image 33). The cord also appears less expanded. And following contrast there is only a faint 5 mm focus of residual enhancement (9 mm and more  intensely enhancing previously). Above and below that level cord signal appears to remain normal. No other abnormal intradural enhancement. No dural thickening. Posterior Fossa, vertebral arteries, paraspinal tissues: Cervicomedullary junction is within normal limits. Preserved major vascular flow voids in the neck. Negative visible neck soft tissues. Disc levels: There is a congenital degree of cervical spinal canal narrowing related to short pedicles, with superimposed degenerative changes: C2-C3: Mild disc bulge and endplate spurring plus ligament flavum hypertrophy. Mild spinal stenosis but no cord mass effect (series 10, image 6). C3-C4: Mild disc bulge and ligament flavum hypertrophy. Mild spinal stenosis but no cord mass effect. C4-C5: Circumferential disc bulge and mild endplate spurring. Less ligament flavum hypertrophy here. Mild facet hypertrophy. Mild spinal stenosis. No cord mass effect. Mild to moderate C5 foraminal stenosis greater on the left. C5-C6: Interval ACDF. But there  does appear to be residual spinal stenosis with mild cord mass effect at the C5 inferior endplate level (series 11, image 27). No cord signal abnormality. Endplate spurring results in severe left and mild right C6 foraminal stenosis (same image). C6-C7: Interval ACDF. Ligament flavum hypertrophy but no significant spinal stenosis. Foraminal patency also appears improved, mild residual stenosis on the right. C7-T1: Mostly foraminal disc bulge and endplate spurring. No spinal stenosis. Moderate left and mild right C8 foraminal stenosis. No upper thoracic spinal stenosis. IMPRESSION: 1. 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 2. Status post ACDF at C5-C6 and C6-C7. Resolved spinal stenosis at C6-C7, and regressed although not resolved abnormal spinal cord signal and enhancement centered at that level. 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 3. Mild  cervical spinal stenosis elsewhere, including some residual cord mass effect related to C5 endplate spurring, but no other abnormal cord signal is identified. 4. Degenerative neural foraminal stenosis, up to moderate at the left C5 and C8 nerve levels, and severe at the left C6 nerve level. Preliminary results of this exam were discussed by telephone with Dr. [place] Sheldon in the ED. Electronically Signed   By: Odessa Fleming M.D.   On: 06/27/2019 22:47   MR Lumbar Spine W Wo Contrast  Result Date: 06/27/2019 CLINICAL DATA:  54 year old male with increasing neurologic deficits, involuntary twitches and spasms. Status post cervical spine ACDF on 01/16/2019. EXAM: MRI LUMBAR SPINE WITHOUT AND WITH CONTRAST TECHNIQUE: Multiplanar and multiecho pulse sequences of the lumbar spine were obtained without and with intravenous contrast. CONTRAST:  11mL GADAVIST GADOBUTROL 1 MMOL/ML IV SOLN in conjunction with contrast enhanced imaging of the head and cervical spine reported separately. COMPARISON:  [facility] Lumbar MRIs 04/19/2019, 12/07/2018 FINDINGS: Segmentation:  Normal. Alignment: Stable degenerative appearing retrolisthesis at L5-S1, straightening of lumbar lordosis elsewhere. There is also mild chronic retrolisthesis of L2 on L3. Vertebrae: No marrow edema or evidence of acute osseous abnormality. Visualized bone marrow signal is within normal limits. Intact visible sacrum and SI joints. Conus medullaris and cauda equina: Conus extends to the T12-L1 level. No lower spinal cord or conus signal abnormality. No abnormal intradural enhancement. No dural thickening. Paraspinal and other soft tissues: Stable and negative; benign appearing right renal midpole cyst. Disc levels: Since 04/19/2019 lower thoracic and lumbar thecal sac patency appears mildly improved above L4-L5 due to regression of epidural lipomatosis. At L2-L3 circumferential disc bulge with small posterior annular fissure and mild posterior  element hypertrophy persist. Mild residual spinal stenosis at that level, improved from January. L4-L5: Persistent mild to moderate spinal stenosis in part due to epidural lipomatosis. Circumferential disc bulge, progressed right foraminal annular fissure since January (series 8 image 28). Moderate facet hypertrophy with degenerative facet joint fluid. No convincing lateral recess stenosis. Moderate right L4 foraminal stenosis. L5-S1: Continued epidural lipomatosis. Chronic retrolisthesis and disc space loss with circumferential disc osteophyte complex and moderate posterior element hypertrophy. Stable spinal and mild lateral recess stenosis. Stable moderate to severe left and moderate right L5 foraminal stenosis. IMPRESSION: 1. Lumbar thecal sac patency has improved since the January MRI at all levels above L4-L5, due to regressed epidural lipomatosis. 2. Chronic disc degeneration at L4-L5 has progressed in the form of new right foraminal annular fissure. Moderate right L4 foraminal stenosis. Mild to moderate multifactorial spinal stenosis is stable. 3. Advanced chronic L5-S1 degeneration is stable with mild spinal and lateral recess stenosis, and moderate to severe L5 foraminal stenosis  greater on the left. Electronically Signed   By: Odessa Fleming M.D.   On: 06/27/2019 22:54    EKG: Independently reviewed.  Normal sinus rhythm with nonspecific changes.  Assessment/Plan Principal Problem:   Weakness Active Problems:   Essential hypertension   ETOH abuse   Prediabetes   Hypokalemia    1. Generalized weakness with numbness of the upper and lower extremities has good tendon reflexes.  MRI brain C-spine and L-spine were done discussed with on-call neurologist Dr. 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 Franky Macho patient's neurosurgeon.  Patient symptoms at this time may be attributed to metabolic reasons including severe  hypokalemia.  Get physical therapy. 2. Severe hypokalemia cause not clear.  Replace and recheck.  Magnesium is normal. 3. Macrocytic anemia check anemia panel. 4. Hypertension uncontrolled on amlodipine.  Will in addition keep patient on as needed IV hydralazine.  Follow blood pressure trends. 5. Alcohol abuse has not taken any alcohol for the last 1 month as per the patient and patient's wife.  We will keep patient on thiamine.   DVT prophylaxis: Lovenox. Code Status: Full code. Family Communication: Patient's wife. Disposition Plan: To be determined. Consults called: Discussed with neurologist. Admission status: Observation.   Eduard Clos MD [facility] Pager [phone].  If 7PM-7AM, please contact night-coverage [email or web address] Password [facility]  06/28/2019, 12:05 AM

## 2019-06-29 ENCOUNTER — Inpatient Hospital Stay (HOSPITAL_COMMUNITY): Payer: BC Managed Care – PPO

## 2019-06-29 ENCOUNTER — Other Ambulatory Visit: Payer: BC Managed Care – PPO

## 2019-06-29 DIAGNOSIS — Y848 Other medical procedures as the cause of abnormal reaction of the patient, or of later complication, without mention of misadventure at the time of the procedure: Secondary | ICD-10-CM | POA: Diagnosis present

## 2019-06-29 DIAGNOSIS — R531 Weakness: Secondary | ICD-10-CM

## 2019-06-29 DIAGNOSIS — D62 Acute posthemorrhagic anemia: Secondary | ICD-10-CM | POA: Diagnosis not present

## 2019-06-29 DIAGNOSIS — G4700 Insomnia, unspecified: Secondary | ICD-10-CM | POA: Diagnosis not present

## 2019-06-29 DIAGNOSIS — Z800 Family history of malignant neoplasm of digestive organs: Secondary | ICD-10-CM | POA: Diagnosis not present

## 2019-06-29 DIAGNOSIS — K219 Gastro-esophageal reflux disease without esophagitis: Secondary | ICD-10-CM | POA: Diagnosis not present

## 2019-06-29 DIAGNOSIS — K592 Neurogenic bowel, not elsewhere classified: Secondary | ICD-10-CM | POA: Diagnosis not present

## 2019-06-29 DIAGNOSIS — E871 Hypo-osmolality and hyponatremia: Secondary | ICD-10-CM | POA: Diagnosis not present

## 2019-06-29 DIAGNOSIS — R5381 Other malaise: Secondary | ICD-10-CM | POA: Diagnosis not present

## 2019-06-29 DIAGNOSIS — R945 Abnormal results of liver function studies: Secondary | ICD-10-CM | POA: Diagnosis not present

## 2019-06-29 DIAGNOSIS — Z8249 Family history of ischemic heart disease and other diseases of the circulatory system: Secondary | ICD-10-CM | POA: Diagnosis not present

## 2019-06-29 DIAGNOSIS — G610 Guillain-Barre syndrome: Secondary | ICD-10-CM | POA: Diagnosis not present

## 2019-06-29 DIAGNOSIS — R0989 Other specified symptoms and signs involving the circulatory and respiratory systems: Secondary | ICD-10-CM | POA: Diagnosis not present

## 2019-06-29 DIAGNOSIS — Z79899 Other long term (current) drug therapy: Secondary | ICD-10-CM | POA: Diagnosis not present

## 2019-06-29 DIAGNOSIS — K760 Fatty (change of) liver, not elsewhere classified: Secondary | ICD-10-CM | POA: Diagnosis present

## 2019-06-29 DIAGNOSIS — I951 Orthostatic hypotension: Secondary | ICD-10-CM | POA: Diagnosis not present

## 2019-06-29 DIAGNOSIS — R270 Ataxia, unspecified: Secondary | ICD-10-CM | POA: Diagnosis not present

## 2019-06-29 DIAGNOSIS — M792 Neuralgia and neuritis, unspecified: Secondary | ICD-10-CM | POA: Diagnosis not present

## 2019-06-29 DIAGNOSIS — R7401 Elevation of levels of liver transaminase levels: Secondary | ICD-10-CM | POA: Diagnosis not present

## 2019-06-29 DIAGNOSIS — G629 Polyneuropathy, unspecified: Secondary | ICD-10-CM | POA: Diagnosis present

## 2019-06-29 DIAGNOSIS — I10 Essential (primary) hypertension: Secondary | ICD-10-CM | POA: Diagnosis not present

## 2019-06-29 DIAGNOSIS — K5900 Constipation, unspecified: Secondary | ICD-10-CM | POA: Diagnosis present

## 2019-06-29 DIAGNOSIS — R29810 Facial weakness: Secondary | ICD-10-CM | POA: Diagnosis not present

## 2019-06-29 DIAGNOSIS — N319 Neuromuscular dysfunction of bladder, unspecified: Secondary | ICD-10-CM | POA: Diagnosis present

## 2019-06-29 DIAGNOSIS — Z981 Arthrodesis status: Secondary | ICD-10-CM | POA: Diagnosis not present

## 2019-06-29 DIAGNOSIS — R471 Dysarthria and anarthria: Secondary | ICD-10-CM | POA: Diagnosis not present

## 2019-06-29 DIAGNOSIS — S30812A Abrasion of penis, initial encounter: Secondary | ICD-10-CM | POA: Diagnosis present

## 2019-06-29 DIAGNOSIS — M545 Low back pain: Secondary | ICD-10-CM | POA: Diagnosis present

## 2019-06-29 LAB — COMPREHENSIVE METABOLIC PANEL
ALT: 38 U/L (ref 0–44)
AST: 54 U/L — ABNORMAL HIGH (ref 15–41)
Albumin: 3.1 g/dL — ABNORMAL LOW (ref 3.5–5.0)
Alkaline Phosphatase: 78 U/L (ref 38–126)
Anion gap: 12 (ref 5–15)
BUN: <5 mg/dL — ABNORMAL LOW (ref 6–20)
CO2: 28 mmol/L (ref 22–32)
Calcium: 9.1 mg/dL (ref 8.9–10.3)
Chloride: 96 mmol/L — ABNORMAL LOW (ref 98–111)
Creatinine, Ser: 0.77 mg/dL (ref 0.61–1.24)
GFR calc Af Amer: >60 mL/min (ref >60)
GFR calc non Af Amer: >60 mL/min (ref >60)
Glucose, Bld: 110 mg/dL — ABNORMAL HIGH (ref 70–99)
Potassium: 3.3 mmol/L — ABNORMAL LOW (ref 3.5–5.1)
Sodium: 136 mmol/L (ref 135–145)
Total Bilirubin: 0.4 mg/dL (ref 0.3–1.2)
Total Protein: 7.2 g/dL (ref 6.5–8.1)

## 2019-06-29 LAB — CBC WITH DIFFERENTIAL/PLATELET
Abs Immature Granulocytes: 0.02 10*3/uL (ref 0.00–0.07)
Basophils Absolute: 0 10*3/uL (ref 0.0–0.1)
Basophils Relative: 0 %
Eosinophils Absolute: 0.1 10*3/uL (ref 0.0–0.5)
Eosinophils Relative: 1 %
HCT: 35.9 % — ABNORMAL LOW (ref 39.0–52.0)
Hemoglobin: 12 g/dL — ABNORMAL LOW (ref 13.0–17.0)
Immature Granulocytes: 0 %
Lymphocytes Relative: 24 %
Lymphs Abs: 1.8 10*3/uL (ref 0.7–4.0)
MCH: 35.1 pg — ABNORMAL HIGH (ref 26.0–34.0)
MCHC: 33.4 g/dL (ref 30.0–36.0)
MCV: 105 fL — ABNORMAL HIGH (ref 80.0–100.0)
Monocytes Absolute: 0.6 10*3/uL (ref 0.1–1.0)
Monocytes Relative: 9 %
Neutro Abs: 4.8 10*3/uL (ref 1.7–7.7)
Neutrophils Relative %: 66 %
Platelets: 372 10*3/uL (ref 150–400)
RBC: 3.42 MIL/uL — ABNORMAL LOW (ref 4.22–5.81)
RDW: 13.4 % (ref 11.5–15.5)
WBC: 7.3 10*3/uL (ref 4.0–10.5)
nRBC: 0 % (ref 0.0–0.2)

## 2019-06-29 LAB — MAGNESIUM: Magnesium: 1.8 mg/dL (ref 1.7–2.4)

## 2019-06-29 LAB — BRAIN NATRIURETIC PEPTIDE: B Natriuretic Peptide: 48.2 pg/mL (ref 0.0–100.0)

## 2019-06-29 MED ORDER — POTASSIUM CHLORIDE CRYS ER 20 MEQ PO TBCR
40.0000 meq | EXTENDED_RELEASE_TABLET | Freq: Two times a day (BID) | ORAL | Status: AC
  Administered 2019-06-29 (×2): 40 meq via ORAL
  Filled 2019-06-29 (×2): qty 2

## 2019-06-29 MED ORDER — HYDRALAZINE HCL 50 MG PO TABS
50.0000 mg | ORAL_TABLET | Freq: Three times a day (TID) | ORAL | Status: DC
  Administered 2019-06-29 – 2019-06-30 (×3): 50 mg via ORAL
  Filled 2019-06-29 (×3): qty 1

## 2019-06-29 MED ORDER — THIAMINE HCL 100 MG/ML IJ SOLN
500.0000 mg | Freq: Three times a day (TID) | INTRAVENOUS | Status: DC
  Administered 2019-06-29 – 2019-07-03 (×11): 500 mg via INTRAVENOUS
  Filled 2019-06-29 (×14): qty 5

## 2019-06-29 MED ORDER — ACETAMINOPHEN 325 MG PO TABS
650.0000 mg | ORAL_TABLET | Freq: Four times a day (QID) | ORAL | Status: AC | PRN
  Administered 2019-06-29 – 2019-07-02 (×2): 650 mg via ORAL
  Filled 2019-06-29 (×3): qty 2

## 2019-06-29 MED ORDER — MAGNESIUM SULFATE IN D5W 1-5 GM/100ML-% IV SOLN
1.0000 g | Freq: Once | INTRAVENOUS | Status: AC
  Administered 2019-06-29: 1 g via INTRAVENOUS
  Filled 2019-06-29: qty 100

## 2019-06-29 NOTE — Progress Notes (Signed)
Rehab Admissions Coordinator Note:  Per PT and OT recommendation, this patient was screened by Cheri Rous for appropriateness for an Inpatient Acute Rehab Consult.  At this time, we are recommending Inpatient Rehab consult. AC will place consult order per protocol.   Cheri Rous 06/29/2019, 1:16 PM  I can be reached at [phone].

## 2019-06-29 NOTE — Progress Notes (Signed)
PROGRESS NOTE                                                                                                                                                                                                             Patient Demographics:    Jorge Weaver, is a 54 y.o. male, DOB - Jul 08, 1965, WLN:[id number]  Admit date - 06/27/2019   Admitting Physician Eduard Clos, MD  Outpatient Primary MD for the patient is Jorge Weaver Jorge Roche, DO  LOS - 1  Chief Complaint  Patient presents with  . Weakness       Brief Narrative  - 54 y.o. male with history of hypertension previous alcohol abuse which patient states has stopped drinking for almost a month now who has had a surgery in October 2024 cervical 6 corpectomy and C5 7 arthrodesis by Dr. Franky Macho presents to the ER because of increasing tightness of the extremities difficulty to move difficulty walking and increasing numbness of the upper and lower extremities.  Denies any nausea vomiting diarrhea headache chest pain.  Has been having recurrent falls.  In the ER his MRI brain and C-spine were nonacute, this was discussed by the admitting physician with neurologist on-call at night Dr. Jerrell Belfast, in the morning I discussed his MRI findings with neurosurgeon Dr. Maisie Fus.  No surgical intervention or medical intervention needed according to the specialist.  Incidentally he was found to have life-threatening low severe hypokalemia along with ongoing weakness and admitted to the hospital.   Subjective:   Patient in bed denies any headache, no chest or abdominal pain, his weakness is improving including on the left side of his face.   Assessment  & Plan :     1.  Generalized subacute weakness worse in the left upper extremity and left side of the face ongoing for several weeks in a patient with history of C-spine fusion surgery.  Case discussed by admitting physician with neurologist Dr. Jerrell Belfast and me with neurosurgeon Dr. Maisie Fus.  No  neurological intervention needed.  His symptoms most likely are due to severe hypokalemia causing picture close to periodic paralysis.  With aggressive replacement of potassium his symptoms have improved quite a bit still await evaluation by PT OT and monitor.  Stable random cortisol and TSH.  2.  Multiple falls at home with lip and tongue injury during a fall along with avulsed right big toe nail.  Due to muscular weakness from #1 above, supportive care and monitor.  3. HTN - on  Norvasc, Lopressor , will add Hydralazine for better control.  4.  History of alcohol abuse.  Counseled to quit.  Placed on folic acid and thiamine.  Last drink 1 month ago.    Family Communication  :  Wife [name] - [phone], 06/28/19, 06/20/19  Code Status : Full  Disposition Plan  :  Stay in the hospital for SEVERE Hypokalemia with periodic paralysis  Consults  :  Neurology Dr Wilford Corner and N. Surgery Dr Maisie Fus over the phone - 06/28/19  Procedures  :    MRI Brain - C Spine -   1. 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 2. Status post ACDF at C5-C6 and C6-C7. Resolved spinal stenosis at C6-C7, and regressed although not resolved abnormal spinal cord signal and enhancement centered at that level. 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 3. Mild cervical spinal stenosis elsewhere, including some residual cord mass effect related to C5 endplate spurring, but no other abnormal cord signal is identified. 4. Degenerative neural foraminal stenosis, up to moderate at the left C5 and C8 nerve levels, and severe at the left C6 nerve level. No acute intracranial abnormality and largely unremarkable for age MRI appearance of the brain.   DVT Prophylaxis  :   Heparin    Lab Results  Component Value Date   PLT 372 06/29/2019    Diet :  Diet Order            Diet Heart Room service appropriate? Yes; Fluid consistency: Thin  Diet effective now                Inpatient Medications Scheduled Meds: . amLODipine  10 mg Oral Daily  . folic acid  1 mg Oral Daily  . gabapentin  300 mg Oral TID  . heparin injection (subcutaneous)  5,000 Units Subcutaneous Q8H  . metoprolol tartrate  100 mg Oral BID  . potassium chloride  40 mEq Oral BID  . thiamine  100 mg Oral Daily   Continuous Infusions: . magnesium sulfate bolus IVPB     PRN Meds:.diphenhydrAMINE, hydrALAZINE, lip balm, [DISCONTINUED] ondansetron **OR** ondansetron (ZOFRAN) IV, zolpidem  Antibiotics  :   Anti-infectives (From admission, onward)   None        Objective:   Vitals:   06/28/19 1545 06/28/19 1546 06/28/19 2001 06/29/19 0749  BP: (!) 150/113 (!) 160/112 (!) 151/112 (!) 159/117  Pulse: 90 88 100 (!) 104  Resp: 18  16 18   Temp: 98.8 F (37.1 C)  98.3 F (36.8 C) 98.9 F (37.2 C)  TempSrc:      SpO2: 98% 98% 100% 96%  Weight:      Height:        SpO2: 96 %  Wt Readings from Last 3 Encounters:  06/28/19 112.4 kg  06/16/19 122.5 kg  06/09/19 122.2 kg     Intake/Output Summary (Last 24 hours) at 06/29/2019 0955 Last data filed at 06/29/2019 08/29/2019 Gross per 24 hour  Intake 1229.71 ml  Output 1000 ml  Net 229.71 ml     Physical Exam  Awake Alert, No new F.N deficits, generalized weakness in all 4 extremities with strength 5/5 but somewhat weaker in the left arm as compared to other limbs but this is chronic according to patient, mild left-sided facial droop which is also been present for a few weeks according to the wife Oval.AT,PERRAL Supple Neck,No JVD, No cervical lymphadenopathy appriciated.  Symmetrical Chest wall movement, Good air movement bilaterally,  CTAB RRR,No Gallops, Rubs or new Murmurs, No Parasternal Heave +ve B.Sounds, Abd Soft, No tenderness, No organomegaly appriciated, No rebound - guarding or rigidity. No Cyanosis, Clubbing or edema, No new Rash or bruise     Data Review:    Recent Labs  Lab 06/27/19 1735  06/28/19 0551 06/29/19 0533  WBC 3.8* 4.8 7.3  HGB 12.1* 10.9* 12.0*  HCT 36.9* 32.5* 35.9*  PLT 374 363 372  MCV 107.3* 104.2* 105.0*  MCH 35.2* 34.9* 35.1*  MCHC 32.8 33.5 33.4  RDW 13.8 13.4 13.4  LYMPHSABS  --  1.2 1.8  MONOABS  --  0.6 0.6  EOSABS  --  0.0 0.1  BASOSABS  --  0.0 0.0    Recent Labs  Lab 06/27/19 1735 06/27/19 1846 06/27/19 2346 06/28/19 0551 06/28/19 1429 06/29/19 0533  NA 136  --   --  137  --  136  K 2.2*  --   --  2.5* 3.5 3.3*  CL 89*  --   --  92*  --  96*  CO2 33*  --   --  35*  --  28  GLUCOSE 122*  --   --  119*  --  110*  BUN 6  --   --  6  --  <5*  CREATININE 0.82  --   --  0.79  --  0.77  CALCIUM 8.7*  --   --  8.5*  --  9.1  AST  --   --   --  51*  --  54*  ALT  --   --   --  35  --  38  ALKPHOS  --   --   --  75  --  78  BILITOT  --   --   --  1.2  --  0.4  ALBUMIN  --   --   --  3.0*  --  3.1*  MG  --  1.7  --   --   --  1.8  TSH  --   --  1.777 1.501  --   --   BNP  --   --   --   --   --  48.2    Recent Labs  Lab 06/28/19 0042 06/29/19 0533  BNP  --  48.2  SARSCOV2NAA NEGATIVE  --     ------------------------------------------------------------------------------------------------------------------ No results for input(s): CHOL, HDL, LDLCALC, TRIG, CHOLHDL, LDLDIRECT in the last 72 hours.  Lab Results  Component Value Date   HGBA1C 6.1 08/07/2017   ------------------------------------------------------------------------------------------------------------------ Recent Labs    06/28/19 0551  TSH 1.501   ------------------------------------------------------------------------------------------------------------------ Recent Labs    06/28/19 0551  VITAMINB12 571  FOLATE 3.2*  FERRITIN 803*  TIBC 235*  IRON 40*  RETICCTPCT 2.1    Coagulation profile No results for input(s): INR, PROTIME in the last 168 hours.  No results for input(s): DDIMER in the last 72 hours.  Cardiac Enzymes No results for input(s):  CKMB, TROPONINI, MYOGLOBIN in the last 168 hours.  Invalid input(s): CK ------------------------------------------------------------------------------------------------------------------    Component Value Date/Time   BNP 48.2 06/29/2019 0533    Micro Results Recent Results (from the past 240 hour(s))  SARS CORONAVIRUS 2 (TAT 6-24 HRS) Nasopharyngeal Nasopharyngeal Swab     Status: None   Collection Time: 06/28/19 12:42 AM   Specimen: Nasopharyngeal Swab  Result Value Ref Range Status   SARS Coronavirus 2 NEGATIVE NEGATIVE Final    Comment: (NOTE) SARS-CoV-2 target nucleic acids  are NOT DETECTED. The SARS-CoV-2 RNA is generally detectable in upper and lower respiratory specimens during the acute phase of infection. Negative results do not preclude SARS-CoV-2 infection, do not rule out co-infections with other pathogens, and should not be used as the sole basis for treatment or other patient management decisions. Negative results must be combined with clinical observations, patient history, and epidemiological information. The expected result is Negative. Fact Sheet for Patients: HairSlick.no Fact Sheet for Healthcare Providers: quierodirigir.com This test is not yet approved or cleared by the [place] FDA and  has been authorized for detection and/or diagnosis of SARS-CoV-2 by FDA under an Emergency Use Authorization (EUA). This EUA will remain  in effect (meaning this test can be used) for the duration of the COVID-19 declaration under Section 56 4(b)(1) of the Act, 21 U.S.C. section 360bbb-3(b)(1), unless the authorization is terminated or revoked sooner. Performed at [facility], [address] [place], [place] [address]     Radiology Reports MR Brain W and Wo Contrast  Result Date: 06/27/2019 CLINICAL DATA:  54 year old male with increasing neurologic deficits, involuntary twitches and spasms. Status  post cervical spine ACDF on 01/16/2019. EXAM: MRI HEAD WITHOUT AND WITH CONTRAST TECHNIQUE: Multiplanar, multiecho pulse sequences of the brain and surrounding structures were obtained without and with intravenous contrast. CONTRAST:  19mL GADAVIST GADOBUTROL 1 MMOL/ML IV SOLN COMPARISON:  No prior head MRI.  Head CT 10/15/2013. FINDINGS: Brain: Cerebral volume is similar to that in 2015. No restricted diffusion to suggest acute infarction. No midline shift, mass effect, evidence of mass lesion, ventriculomegaly, extra-axial collection or acute intracranial hemorrhage. Cervicomedullary junction and pituitary are within normal limits. Largely normal for age gray and white matter signal, minimal scattered nonspecific cerebral white matter T2 and FLAIR hyperintensity. No cortical encephalomalacia or chronic cerebral blood products. No abnormal enhancement identified. No dural thickening. Vascular: Major intracranial vascular flow voids are preserved. The major dural venous sinuses are enhancing and appear to be patent. Skull and upper cervical spine: Cervical spine details are reported separately. Visualized bone marrow signal is within normal limits. Sinuses/Orbits: Negative orbits. Paranasal sinuses and mastoids are stable and well pneumatized. Other: Scalp and face soft tissues appear negative. There is a tiny right nasopharyngeal retention cyst, inconsequential. IMPRESSION: No acute intracranial abnormality and largely unremarkable for age MRI appearance of the brain. Electronically Signed   By: Odessa Fleming M.D.   On: 06/27/2019 22:32   MR Cervical Spine W or Wo Contrast  Result Date: 06/27/2019 CLINICAL DATA:  54 year old male with increasing neurologic deficits, involuntary twitches and spasms. Status post cervical spine ACDF on 01/16/2019. EXAM: MRI CERVICAL SPINE WITHOUT AND WITH CONTRAST TECHNIQUE: Multiplanar and multiecho pulse sequences of the cervical spine, to include the craniocervical junction and  cervicothoracic junction, were obtained without and with intravenous contrast. CONTRAST:  64mL GADAVIST GADOBUTROL 1 MMOL/ML IV SOLN COMPARISON:  Preoperative cervical spine MRI with contrast on 01/16/2019. And outside preoperative cervical spine without contrast 01/02/2019, now available on [employer]. FINDINGS: Alignment: Straightening of cervical lordosis, less reversal compared to October. There may be mild residual retrolisthesis of C5 on C6. Vertebrae: Mild hardware susceptibility artifact from C5-C6 and C6-C7 ACDF, new from prior MRI. No marrow edema or evidence of acute osseous abnormality. Background bone marrow signal within normal limits. Cord: The spinal cord remains abnormal at C6-C7, although the extent of abnormal cord T2 signal is regressed compared to 01/02/2019, where the T2 abnormality continued to the C7-T1 level. Patchy abnormal  T2 signal is now confined to the C6-C7 level, and on axial images appears to spare only the most peripheral cord white matter (series 9, image 33). The cord also appears less expanded. And following contrast there is only a faint 5 mm focus of residual enhancement (9 mm and more intensely enhancing previously). Above and below that level cord signal appears to remain normal. No other abnormal intradural enhancement. No dural thickening. Posterior Fossa, vertebral arteries, paraspinal tissues: Cervicomedullary junction is within normal limits. Preserved major vascular flow voids in the neck. Negative visible neck soft tissues. Disc levels: There is a congenital degree of cervical spinal canal narrowing related to short pedicles, with superimposed degenerative changes: C2-C3: Mild disc bulge and endplate spurring plus ligament flavum hypertrophy. Mild spinal stenosis but no cord mass effect (series 10, image 6). C3-C4: Mild disc bulge and ligament flavum hypertrophy. Mild spinal stenosis but no cord mass effect. C4-C5: Circumferential disc bulge and mild endplate  spurring. Less ligament flavum hypertrophy here. Mild facet hypertrophy. Mild spinal stenosis. No cord mass effect. Mild to moderate C5 foraminal stenosis greater on the left. C5-C6: Interval ACDF. But there does appear to be residual spinal stenosis with mild cord mass effect at the C5 inferior endplate level (series 11, image 27). No cord signal abnormality. Endplate spurring results in severe left and mild right C6 foraminal stenosis (same image). C6-C7: Interval ACDF. Ligament flavum hypertrophy but no significant spinal stenosis. Foraminal patency also appears improved, mild residual stenosis on the right. C7-T1: Mostly foraminal disc bulge and endplate spurring. No spinal stenosis. Moderate left and mild right C8 foraminal stenosis. No upper thoracic spinal stenosis. IMPRESSION: 1. 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 2. Status post ACDF at C5-C6 and C6-C7. Resolved spinal stenosis at C6-C7, and regressed although not resolved abnormal spinal cord signal and enhancement centered at that level. 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 3. Mild cervical spinal stenosis elsewhere, including some residual cord mass effect related to C5 endplate spurring, but no other abnormal cord signal is identified. 4. Degenerative neural foraminal stenosis, up to moderate at the left C5 and C8 nerve levels, and severe at the left C6 nerve level. Preliminary results of this exam were discussed by telephone with Dr. [place] Sheldon in the ED. Electronically Signed   By: Odessa Fleming M.D.   On: 06/27/2019 22:47   MR Lumbar Spine W Wo Contrast  Result Date: 06/27/2019 CLINICAL DATA:  54 year old male with increasing neurologic deficits, involuntary twitches and spasms. Status post cervical spine ACDF on 01/16/2019. EXAM: MRI LUMBAR SPINE WITHOUT AND WITH CONTRAST TECHNIQUE: Multiplanar and multiecho pulse sequences of the lumbar spine were obtained  without and with intravenous contrast. CONTRAST:  10mL GADAVIST GADOBUTROL 1 MMOL/ML IV SOLN in conjunction with contrast enhanced imaging of the head and cervical spine reported separately. COMPARISON:  [facility] Lumbar MRIs 04/19/2019, 12/07/2018 FINDINGS: Segmentation:  Normal. Alignment: Stable degenerative appearing retrolisthesis at L5-S1, straightening of lumbar lordosis elsewhere. There is also mild chronic retrolisthesis of L2 on L3. Vertebrae: No marrow edema or evidence of acute osseous abnormality. Visualized bone marrow signal is within normal limits. Intact visible sacrum and SI joints. Conus medullaris and cauda equina: Conus extends to the T12-L1 level. No lower spinal cord or conus signal abnormality. No abnormal intradural enhancement. No dural thickening. Paraspinal and other soft tissues: Stable and negative; benign appearing right renal midpole cyst. Disc levels: Since 04/19/2019 lower thoracic and lumbar thecal  sac patency appears mildly improved above L4-L5 due to regression of epidural lipomatosis. At L2-L3 circumferential disc bulge with small posterior annular fissure and mild posterior element hypertrophy persist. Mild residual spinal stenosis at that level, improved from January. L4-L5: Persistent mild to moderate spinal stenosis in part due to epidural lipomatosis. Circumferential disc bulge, progressed right foraminal annular fissure since January (series 8 image 28). Moderate facet hypertrophy with degenerative facet joint fluid. No convincing lateral recess stenosis. Moderate right L4 foraminal stenosis. L5-S1: Continued epidural lipomatosis. Chronic retrolisthesis and disc space loss with circumferential disc osteophyte complex and moderate posterior element hypertrophy. Stable spinal and mild lateral recess stenosis. Stable moderate to severe left and moderate right L5 foraminal stenosis. IMPRESSION: 1. Lumbar thecal sac patency has improved since the January  MRI at all levels above L4-L5, due to regressed epidural lipomatosis. 2. Chronic disc degeneration at L4-L5 has progressed in the form of new right foraminal annular fissure. Moderate right L4 foraminal stenosis. Mild to moderate multifactorial spinal stenosis is stable. 3. Advanced chronic L5-S1 degeneration is stable with mild spinal and lateral recess stenosis, and moderate to severe L5 foraminal stenosis greater on the left. Electronically Signed   By: Odessa FlemingH  Hall M.D.   On: 06/27/2019 22:54    Time Spent in minutes  30   Susa RaringPrashant Gordan Grell M.D on 06/29/2019 at 9:55 AM  To page go to [email or web address] - password [email or web address]

## 2019-06-29 NOTE — Evaluation (Signed)
Occupational Therapy Evaluation Patient Details Name: Jorge Weaver. MRN: [id number] DOB: [birth year] Today's Date: 06/29/2019    History of Present Illness Pt is a 54 y/o male who presents to the ER with increasing tightness of the extremities, difficulty moving/walking and increased numbness of the upper and lower extremities. MRI of the brain and C-spine were negative for acute changes. Neurosurgery consulted with no surgical/medical intervention recommended. Pt found to have severe hypokalemia and was admitted. PMHx includes anemia, HTN, PNA, hx of ACDF 12/2018   Clinical Impression   This 54 y/o male presents with the above. PTA pt very independent with ADL, iADL and functional mobility, was working. Pt presents supine in bed pleasant and very motivated to work with therapies. Overall pt presenting with ataxia and notable discoordination in bil UEs and LEs with completion of mobility/functional tasks. Pt requiring two person assist for safe completion of functional transfers using RW, tolerating OOB to recliner during session today. Pt requiring increased assist for ADL tasks due to impaired coordination, requiring at least minA for seated UB ADL and maxA for LB ADL. He will benefit from continued acute OT services and feel he is an excellent candidate for CIR level therapies to progress pt towards his PLOF of independent. Will follow.     Follow Up Recommendations  CIR;Supervision/Assistance - 24 hour    Equipment Recommendations  3 in 1 bedside commode;Other (comment)(to be further assessed in next venue)           Precautions / Restrictions Precautions Precautions: Fall Precaution Comments: ataxia Restrictions Weight Bearing Restrictions: No      Mobility Bed Mobility Overal bed mobility: Needs Assistance Bed Mobility: Supine to Sit     Supine to sit: +2 for safety/equipment;Mod assist     General bed mobility comments: for trunk elevation and for initial  stability  Transfers Overall transfer level: Needs assistance Equipment used: Rolling walker (2 wheeled) Transfers: Sit to/from [employer] Sit to Stand: Min guard;+2 safety/equipment Stand pivot transfers: Min assist;+2 physical assistance;+2 safety/equipment       General transfer comment: VCs for safe hand placement when standing to RW, pt requiring increased effort but able to perform without physical assist, completed x2 from EOB. Pt able to take pivotal steps to recliner with +2 steadying assist. Pt with ataxic movements and noted LLE instability/buckling    Balance Overall balance assessment: Needs assistance;History of Falls Sitting-balance support: Feet supported Sitting balance-Leahy Scale: Fair Sitting balance - Comments: close minguard for static balace, minguard-minA for dynamic balance   Standing balance support: Bilateral upper extremity supported Standing balance-Leahy Scale: Poor Standing balance comment: reliant on UE support/external assist                            ADL either performed or assessed with clinical judgement   ADL Overall ADL's : Needs assistance/impaired Eating/Feeding: Set up;Sitting Eating/Feeding Details (indicate cue type and reason): will benefit from further assessment given ataxic/discoordinated movements Grooming: Minimal assistance;Sitting   Upper Body Bathing: Minimal assistance;Sitting   Lower Body Bathing: Maximal assistance;+2 for physical assistance;Sit to/from stand   Upper Body Dressing : Minimal assistance;Sitting Upper Body Dressing Details (indicate cue type and reason): donning new gown Lower Body Dressing: Maximal assistance;+2 for physical assistance;Sit to/from stand Lower Body Dressing Details (indicate cue type and reason): minA+2 standing balance; pt able to reach and pull socks higher up on his feet but requiring increased time/effort to perform fine motor  task. suspect will require  increased assist to fully don/doff socks Toilet Transfer: Minimal assistance;+2 for physical assistance;RW Toilet Transfer Details (indicate cue type and reason): simulated via transfer to recliner, VCs for sequencing steps with RW Toileting- Clothing Manipulation and Hygiene: Moderate assistance;Sit to/from stand       Functional mobility during ADLs: Minimal assistance;+2 for physical assistance;Rolling walker       Vision Baseline Vision/History: (reports he needs to get his eyes checked)       Perception     Praxis      Pertinent Vitals/Pain Pain Assessment: No/denies pain     Hand Dominance Right   Extremity/Trunk Assessment Upper Extremity Assessment Upper Extremity Assessment: RUE deficits/detail;LUE deficits/detail RUE Deficits / Details: discoordinated/ataxic movements noted bil. Good strength throughout (grossly 4/5). Pt endorses tingling in bil UEs digits to shoulder. RUE Sensation: decreased light touch;decreased proprioception RUE Coordination: decreased fine motor;decreased gross motor LUE Deficits / Details: discoordinated/ataxic movements noted bil. Good strength throughout (grossly 4/5). Pt endorses tingling in bil UEs digits to shoulder. LUE Sensation: decreased light touch;decreased proprioception LUE Coordination: decreased fine motor;decreased gross motor   Lower Extremity Assessment Lower Extremity Assessment: Defer to PT evaluation       Communication Communication Communication: No difficulties   Cognition Arousal/Alertness: Awake/alert Behavior During Therapy: WFL for tasks assessed/performed Overall Cognitive Status: Within Functional Limits for tasks assessed                                     General Comments  max HR noted 125 with activity, 110 initially while seated EOB    Exercises     Shoulder Instructions      Home Living Family/patient expects to be discharged to:: Private residence Living Arrangements:  Spouse/significant other;Children Available Help at Discharge: Family Type of Home: House Home Access: Stairs to enter Secretary/administrator of Steps: 9 Entrance Stairs-Rails: Right Home Layout: One level;Laundry or work area in basement     [employer] Shower/Tub: Chief Strategy Officer: Standard     Home Equipment: Environmental consultant - 2 wheels          Prior Functioning/Environment Level of Independence: Independent        Comments: working doing Mudlogger work        OT Problem List: Decreased strength;Decreased range of motion;Decreased activity tolerance;Impaired balance (sitting and/or standing);Decreased coordination;Decreased safety awareness;Decreased knowledge of use of DME or AE;Impaired tone      OT Treatment/Interventions: Self-care/ADL training;Therapeutic exercise;Neuromuscular education;Energy conservation;DME and/or AE instruction;Therapeutic activities;Patient/family education;Balance training    OT Goals(Current goals can be found in the care plan section) Acute Rehab OT Goals Patient Stated Goal: regain his independence, get back to fishing OT Goal Formulation: With patient Time For Goal Achievement: 07/13/19 Potential to Achieve Goals: Good  OT Frequency: Min 2X/week   Barriers to D/C:            Co-evaluation PT/OT/SLP Co-Evaluation/Treatment: Yes Reason for Co-Treatment: Complexity of the patient's impairments (multi-system involvement);For patient/therapist safety;To address functional/ADL transfers   OT goals addressed during session: ADL's and self-care;Strengthening/ROM      AM-PAC OT "6 Clicks" Daily Activity     Outcome Measure Help from another person eating meals?: A Little Help from another person taking care of personal grooming?: A Little Help from another person toileting, which includes using toliet, bedpan, or urinal?: A Lot Help from another person bathing (including washing, rinsing, drying)?: A  Lot Help  from another person to put on and taking off regular upper body clothing?: A Little Help from another person to put on and taking off regular lower body clothing?: A Lot 6 Click Score: 15   End of Session Equipment Utilized During Treatment: Gait belt;Rolling walker Nurse Communication: Mobility status  Activity Tolerance: Patient tolerated treatment well Patient left: in chair;with call bell/phone within reach;with chair alarm set  OT Visit Diagnosis: Other abnormalities of gait and mobility (R26.89);Ataxia, unspecified (R27.0);History of falling (Z91.81)                Time: 0950-1010 OT Time Calculation (min): 20 min Charges:  OT General Charges $OT Visit: 1 Visit OT Evaluation $OT Eval Moderate Complexity: [place], OT Acute Rehabilitation Services Pager [phone] Office [phone]   Jorge Weaver 06/29/2019, 10:39 AM

## 2019-06-29 NOTE — Consult Note (Addendum)
NEURO HOSPITALIST CONSULT NOTE   Requesting physician: Dr. Thedore Mins   Reason for Consult: numbness   History obtained from:  Patient   / wife HPI:                                                                                                                                          Jorge Lamm. is an 54 y.o. male  With PMH ETOH abuse ( last drink 1 month ago), HTN cervical spondylosis myelopathy who presented to ED 4/9 with increasing numbness in arms and legs.   Per patient he started having numbness in his feet about 1 month ago and it progressively increased up hid body now at umbilicus. Then about 2 weeks ago his face and hands became numb as well. Now numbness up to elbows. Patient endorses multiple falls with oral trauma. Denies any fever or illness prior to these symptoms beginning.   Hospital course: 4/9 admitted K+ 2.2 ( replaced) MRI w/wo: nothing acute MR c-spine:  1. 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 2. Status post ACDF at C5-C6 and C6-C7. Resolved spinal stenosis at C6-C7, and regressed although not resolved abnormal spinal cord signal and enhancement centered at that level. 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 3. Mild cervical spinal stenosis elsewhere, including some residual cord mass effect related to C5 endplate spurring, but no other abnormal cord signal is identified. 4. Degenerative neural foraminal stenosis, up to moderate at the left C5 and C8 nerve levels, and severe at the left C6 nerve level.  MR lumbar spine:  1. Lumbar thecal sac patency has improved since the January MRI at all levels above L4-L5, due to regressed epidural lipomatosis. 2. Chronic disc degeneration at L4-L5 has progressed in the form of new right foraminal annular fissure. Moderate right L4 foraminal stenosis. Mild to moderate multifactorial spinal stenosis is stable. 3.  Advanced chronic L5-S1 degeneration is stable with mild spinal and lateral recess stenosis, and moderate to severe L5 foraminal stenosis greater on the left. Past Medical History:  Diagnosis Date  . Anemia    low iron  . Asthma    as a child  . Cervical spondylosis with myelopathy   . Fatty liver   . GERD (gastroesophageal reflux disease)   . Heart murmur    when he was younger   . Hypertension   . Pneumonia     Past Surgical History:  Procedure Laterality Date  . ANTERIOR CERVICAL DECOMP/DISCECTOMY FUSION N/A 01/16/2019   Procedure: Cervical six corpectomy with Cervical five-seven fusion and plating;  Surgeon: Coletta Memos, MD;  Location: [facility] OR;  Service: Neurosurgery;  Laterality: N/A;  . NO PAST SURGERIES    . WISDOM TOOTH EXTRACTION    . WRIST SURGERY  Left 2014   fx    Family History  Problem Relation Age of Onset  . Hypertension Mother   . Heart attack Father   . Colon cancer Sister   . Esophageal cancer Neg Hx   . Rectal cancer Neg Hx   . Stomach cancer Neg Hx        Social History:  reports that he has never smoked. He has never used smokeless tobacco. He reports current alcohol use of about 3.0 standard drinks of alcohol per week. He reports that he does not use drugs.  No Known Allergies  MEDICATIONS:                                                                                                                     Scheduled: . amLODipine  10 mg Oral Daily  . folic acid  1 mg Oral Daily  . gabapentin  300 mg Oral TID  . heparin injection (subcutaneous)  5,000 Units Subcutaneous Q8H  . hydrALAZINE  50 mg Oral Q8H  . metoprolol tartrate  100 mg Oral BID  . potassium chloride  40 mEq Oral BID   Continuous: . thiamine injection     UYQ:[id number], hydrALAZINE, lip balm, [DISCONTINUED] ondansetron **OR** ondansetron (ZOFRAN) IV, zolpidem   ROS:                                                                                                                                        ROS was performed and is negative except as noted in HPI   Blood pressure (!) 144/115, pulse (!) 101, temperature 98.1 F (36.7 C), resp. rate 16, height 5\' 11"  (1.803 m), weight 112.4 kg, SpO2 97 %.   General Examination:                                                                                                       Physical Exam  Constitutional: Appears well-developed and well-nourished.  Psych: Affect appropriate to situation Eyes: Normal  external eye and conjunctiva. HENT: Normocephalic, no lesions, without obvious abnormality.   Musculoskeletal-no joint tenderness, deformity or swelling Cardiovascular: Normal rate and regular rhythm.  Respiratory: Effort normal, non-labored breathing saturations WNL GI: Soft.  No distension. There is no tenderness.  Skin: WDI  Neuro: Mental Status: Patient is awake, alert, oriented to person, place, month, year, and situation. Patient is able to give a clear and coherent history. No signs of aphasia or neglect Cranial Nerves: II: Visual Fields are full. Pupils are equal, round, and reactive to light.   III,IV, VI: EOMI without ptosis or diploplia.  V: Facial sensation is symmetric to temperature and Light touch VII: Facial movement is symmetric.  VIII: hearing is intact to voice X: Uvula elevates symmetrically XI: Shoulder shrug is symmetric. XII: tongue is midline without atrophy or fasciculations.  Motor: RUE 5/5 LUE 5/5 RLE 5/5 LLE 5/5  Sensory: From umbilicus down to ankle patient has no cool temp sensation and impaired pinprick and LT sensation. Vibratory sense is impaired at the knees and ankle , as well as proprioception impaired From hand  Up to elbow pinprick and LT impaired. Above elbow able to feel LT and pinprick. Deep Tendon Reflexes: 1+ not brisk and symmetric in the biceps and brachioradialis. Absent patellar  Plantars: Toes are unequivocal bilaterally.  Cerebellar: FNF with some  dysmetria            Lab Results: Basic Metabolic Panel: Recent Labs  Lab 06/27/19 1735 06/27/19 1846 06/28/19 0551 06/28/19 1429 06/29/19 0533  NA 136  --  137  --  136  K 2.2*  --  2.5* 3.5 3.3*  CL 89*  --  92*  --  96*  CO2 33*  --  35*  --  28  GLUCOSE 122*  --  119*  --  110*  BUN 6  --  6  --  <5*  CREATININE 0.82  --  0.79  --  0.77  CALCIUM 8.7*  --  8.5*  --  9.1  MG  --  1.7  --   --  1.8    CBC: Recent Labs  Lab 06/27/19 1735 06/28/19 0551 06/29/19 0533  WBC 3.8* 4.8 7.3  NEUTROABS  --  3.0 4.8  HGB 12.1* 10.9* 12.0*  HCT 36.9* 32.5* 35.9*  MCV 107.3* 104.2* 105.0*  PLT 374 363 372     Imaging: MR Brain W and Wo Contrast  Result Date: 06/27/2019 CLINICAL DATA:  54 year old male with increasing neurologic deficits, involuntary twitches and spasms. Status post cervical spine ACDF on 01/16/2019. EXAM: MRI HEAD WITHOUT AND WITH CONTRAST TECHNIQUE: Multiplanar, multiecho pulse sequences of the brain and surrounding structures were obtained without and with intravenous contrast. CONTRAST:  10mL GADAVIST GADOBUTROL 1 MMOL/ML IV SOLN COMPARISON:  No prior head MRI.  Head CT 10/15/2013. FINDINGS: Brain: Cerebral volume is similar to that in 2015. No restricted diffusion to suggest acute infarction. No midline shift, mass effect, evidence of mass lesion, ventriculomegaly, extra-axial collection or acute intracranial hemorrhage. Cervicomedullary junction and pituitary are within normal limits. Largely normal for age gray and white matter signal, minimal scattered nonspecific cerebral white matter T2 and FLAIR hyperintensity. No cortical encephalomalacia or chronic cerebral blood products. No abnormal enhancement identified. No dural thickening. Vascular: Major intracranial vascular flow voids are preserved. The major dural venous sinuses are enhancing and appear to be patent. Skull and upper cervical spine: Cervical spine details are reported separately. Visualized bone  marrow signal is within normal limits.  Sinuses/Orbits: Negative orbits. Paranasal sinuses and mastoids are stable and well pneumatized. Other: Scalp and face soft tissues appear negative. There is a tiny right nasopharyngeal retention cyst, inconsequential. IMPRESSION: No acute intracranial abnormality and largely unremarkable for age MRI appearance of the brain. Electronically Signed   By: Odessa Fleming M.D.   On: 06/27/2019 22:32   MR Cervical Spine W or Wo Contrast  Result Date: 06/27/2019 CLINICAL DATA:  54 year old male with increasing neurologic deficits, involuntary twitches and spasms. Status post cervical spine ACDF on 01/16/2019. EXAM: MRI CERVICAL SPINE WITHOUT AND WITH CONTRAST TECHNIQUE: Multiplanar and multiecho pulse sequences of the cervical spine, to include the craniocervical junction and cervicothoracic junction, were obtained without and with intravenous contrast. CONTRAST:  41mL GADAVIST GADOBUTROL 1 MMOL/ML IV SOLN COMPARISON:  Preoperative cervical spine MRI with contrast on 01/16/2019. And outside preoperative cervical spine without contrast 01/02/2019, now available on [employer]. FINDINGS: Alignment: Straightening of cervical lordosis, less reversal compared to October. There may be mild residual retrolisthesis of C5 on C6. Vertebrae: Mild hardware susceptibility artifact from C5-C6 and C6-C7 ACDF, new from prior MRI. No marrow edema or evidence of acute osseous abnormality. Background bone marrow signal within normal limits. Cord: The spinal cord remains abnormal at C6-C7, although the extent of abnormal cord T2 signal is regressed compared to 01/02/2019, where the T2 abnormality continued to the C7-T1 level. Patchy abnormal T2 signal is now confined to the C6-C7 level, and on axial images appears to spare only the most peripheral cord white matter (series 9, image 33). The cord also appears less expanded. And following contrast there is only a faint 5 mm focus of residual enhancement (9 mm  and more intensely enhancing previously). Above and below that level cord signal appears to remain normal. No other abnormal intradural enhancement. No dural thickening. Posterior Fossa, vertebral arteries, paraspinal tissues: Cervicomedullary junction is within normal limits. Preserved major vascular flow voids in the neck. Negative visible neck soft tissues. Disc levels: There is a congenital degree of cervical spinal canal narrowing related to short pedicles, with superimposed degenerative changes: C2-C3: Mild disc bulge and endplate spurring plus ligament flavum hypertrophy. Mild spinal stenosis but no cord mass effect (series 10, image 6). C3-C4: Mild disc bulge and ligament flavum hypertrophy. Mild spinal stenosis but no cord mass effect. C4-C5: Circumferential disc bulge and mild endplate spurring. Less ligament flavum hypertrophy here. Mild facet hypertrophy. Mild spinal stenosis. No cord mass effect. Mild to moderate C5 foraminal stenosis greater on the left. C5-C6: Interval ACDF. But there does appear to be residual spinal stenosis with mild cord mass effect at the C5 inferior endplate level (series 11, image 27). No cord signal abnormality. Endplate spurring results in severe left and mild right C6 foraminal stenosis (same image). C6-C7: Interval ACDF. Ligament flavum hypertrophy but no significant spinal stenosis. Foraminal patency also appears improved, mild residual stenosis on the right. C7-T1: Mostly foraminal disc bulge and endplate spurring. No spinal stenosis. Moderate left and mild right C8 foraminal stenosis. No upper thoracic spinal stenosis. IMPRESSION: 1. 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 2. Status post ACDF at C5-C6 and C6-C7. Resolved spinal stenosis at C6-C7, and regressed although not resolved abnormal spinal cord signal and enhancement centered at that level. 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 3.  Mild cervical spinal stenosis elsewhere, including some residual cord mass effect related to C5 endplate spurring, but no other abnormal cord signal is identified. 4. Degenerative neural foraminal stenosis,  up to moderate at the left C5 and C8 nerve levels, and severe at the left C6 nerve level. Preliminary results of this exam were discussed by telephone with Dr. [place]had Sheldon in the ED. Electronically Signed   By: Odessa FlemingH  Hall M.D.   On: 06/27/2019 22:47   MR Lumbar Spine W Wo Contrast  Result Date: 06/27/2019 CLINICAL DATA:  54 year old male with increasing neurologic deficits, involuntary twitches and spasms. Status post cervical spine ACDF on 01/16/2019. EXAM: MRI LUMBAR SPINE WITHOUT AND WITH CONTRAST TECHNIQUE: Multiplanar and multiecho pulse sequences of the lumbar spine were obtained without and with intravenous contrast. CONTRAST:  10mL GADAVIST GADOBUTROL 1 MMOL/ML IV SOLN in conjunction with contrast enhanced imaging of the head and cervical spine reported separately. COMPARISON:  [facility] Lumbar MRIs 04/19/2019, 12/07/2018 FINDINGS: Segmentation:  Normal. Alignment: Stable degenerative appearing retrolisthesis at L5-S1, straightening of lumbar lordosis elsewhere. There is also mild chronic retrolisthesis of L2 on L3. Vertebrae: No marrow edema or evidence of acute osseous abnormality. Visualized bone marrow signal is within normal limits. Intact visible sacrum and SI joints. Conus medullaris and cauda equina: Conus extends to the T12-L1 level. No lower spinal cord or conus signal abnormality. No abnormal intradural enhancement. No dural thickening. Paraspinal and other soft tissues: Stable and negative; benign appearing right renal midpole cyst. Disc levels: Since 04/19/2019 lower thoracic and lumbar thecal sac patency appears mildly improved above L4-L5 due to regression of epidural lipomatosis. At L2-L3 circumferential disc bulge with small posterior annular fissure and mild  posterior element hypertrophy persist. Mild residual spinal stenosis at that level, improved from January. L4-L5: Persistent mild to moderate spinal stenosis in part due to epidural lipomatosis. Circumferential disc bulge, progressed right foraminal annular fissure since January (series 8 image 28). Moderate facet hypertrophy with degenerative facet joint fluid. No convincing lateral recess stenosis. Moderate right L4 foraminal stenosis. L5-S1: Continued epidural lipomatosis. Chronic retrolisthesis and disc space loss with circumferential disc osteophyte complex and moderate posterior element hypertrophy. Stable spinal and mild lateral recess stenosis. Stable moderate to severe left and moderate right L5 foraminal stenosis. IMPRESSION: 1. Lumbar thecal sac patency has improved since the January MRI at all levels above L4-L5, due to regressed epidural lipomatosis. 2. Chronic disc degeneration at L4-L5 has progressed in the form of new right foraminal annular fissure. Moderate right L4 foraminal stenosis. Mild to moderate multifactorial spinal stenosis is stable. 3. Advanced chronic L5-S1 degeneration is stable with mild spinal and lateral recess stenosis, and moderate to severe L5 foraminal stenosis greater on the left. Electronically Signed   By: Odessa FlemingH  Hall M.D.   On: 06/27/2019 22:54    Assessment:  Jorge GladJoe Kaley Jr. is an 54 y.o. male  With PMH ETOH abuse ( last drink 1 month ago), HTN cervical spondylosis myelopathy who presented to ED 4/9 with increasing numbness in arms and legs. On exam patient is severely ataxic with standing. No vibratory sense. Cool temp and LT are impaired as above as well as proprioception. Reflexes are also down.  Will order serum test as below including heavy metals. Consider LP.  Sensory neuropathy History of cervical myelopathy status post surgery   Recommendations: - high dose thiamine 500 mg TID for 3 days, then 250 mg once daily for 5 days --MMA, VDRL, RPR. HIV, Vit E, Heavy  metals ( blood and urine)   Valentina LucksJessica Williams, MSN, NP-C [facility] [phone]  Attending neurologist's note to follow   06/29/2019, 6:06 PM  NEUROHOSPITALIST ADDENDUM  Performed a face to face diagnostic evaluation.   I have reviewed the contents of history and physical exam as documented by PA/ARNP/Resident and agree with above documentation.  I have discussed and formulated the above plan as documented. Edits to the note have been made as needed.  54 year old male with past medical history of alcohol abuse, cervical spondylosis with myelopathy status C6 post corpectomy and C5-C7 fusion done October 2020 presents with 1 month history of progressive weakness.  Potassium noted to be 2.2.  Also noted to have facial droop and underwent MRI brain with and without contrast which was negative for acute findings, MRI C-spine and MRI L-spine which showed no cord compression.  Patient also having numbness of the upper and lower extremities with multiple falls.  Neurology was consulted because despite correction of potassium patient continues to have generalized weakness as well as facial droop.    On assessment, patient has 5 out of 5 strength in both upper and lower extremities.  Both patellar and ankle reflexes are absent and diminished reflexes in upper extremities.  Patient has impaired proprioception and vibration loss, also has dysmetria to finger-nose and heel-to-shin but significant truncal ataxia. I do not appreciate any unilateral facial droop, but suspect he may have generalized facial weakness which may be more prominent as relates to one side.  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 Of note patient also has macrocytic anemia-B12 level is 570.  Would recommend nutritional work-up as other causes for dorsal column symptoms-such as syphilis.  Guillain-Barr also in the differential, but given patient's history of alcohol  abuse and macrocytic anemia, suspect this to be more nutritional.  We will go ahead and start high-dose IV thiamine as B1 level is still pending-however usually thiamine deficiency associated with more painful neuropathy which patient denies.   Georgiana Spinner Lavine Hargrove MD [facility] [phone]   If 7pm to 7am, please call on call as listed on AMION.

## 2019-06-29 NOTE — Evaluation (Signed)
Physical Therapy Evaluation Patient Details Name: Jorge Weaver. MRN: [id number] DOB: 05/14/1965 Today's Date: 06/29/2019   History of Present Illness  Pt is a 54 y/o male who presents to the ER with increasing tightness of the extremities, difficulty moving/walking and increased numbness of the upper and lower extremities. MRI of the brain and C-spine were negative for acute changes. Neurosurgery consulted with no surgical/medical intervention recommended. Pt found to have severe hypokalemia and was admitted. PMHx includes anemia, HTN, PNA, hx of ACDF 12/2018    Clinical Impression  Pt presents with moderate to severe mobility limitations due to weakness, chronically poor motor control, sensory changes, decrease balance and coordination resulting in problems moving around and changing positions. Will benefit from PT to address deficits with goal of return to work and fishing.  Recommend CIR to screen for admission. Will initiate care in acute setting.     Follow Up Recommendations CIR    Equipment Recommendations  None recommended by PT    Recommendations for Other Services Rehab consult     Precautions / Restrictions Precautions Precautions: Fall Precaution Comments: ataxia Restrictions Weight Bearing Restrictions: No      Mobility  Bed Mobility Overal bed mobility: Needs Assistance Bed Mobility: Supine to Sit     Supine to sit: +2 for safety/equipment;Mod assist     General bed mobility comments: for trunk elevation and for initial stability  Transfers Overall transfer level: Needs assistance Equipment used: Rolling walker (2 wheeled) Transfers: Sit to/from [employer] Sit to Stand: Min guard;+2 safety/equipment Stand pivot transfers: Min assist;+2 physical assistance;+2 safety/equipment       General transfer comment: pt reports he feel like he's 'being lifted up' associated with ataxic instability of posture; decr advancement of limbs for transfer  to chair with more difficulty position LLE v. RLE  Ambulation/Gait             General Gait Details: defered due to pt weakness  Stairs            Wheelchair Mobility    Modified Rankin (Stroke Patients Only)       Balance Overall balance assessment: Needs assistance;History of Falls Sitting-balance support: Feet supported;No upper extremity supported Sitting balance-Leahy Scale: Fair Sitting balance - Comments: tends to lean to left, but then noted pt weight shifting to get gown out from under him with generally good control; does demonstate some postural instability over time   Standing balance support: Bilateral upper extremity supported;During functional activity Standing balance-Leahy Scale: Poor Standing balance comment: depends on external support, unable to assume/maintain upright and stable/quiet posture                             Pertinent Vitals/Pain Pain Assessment: No/denies pain    Home Living Family/patient expects to be discharged to:: Private residence Living Arrangements: Spouse/significant other;Children Available Help at Discharge: Family Type of Home: House Home Access: Stairs to enter Entrance Stairs-Rails: Right Entrance Stairs-Number of Steps: 9 Home Layout: One level;Laundry or work area in [employer] Equipment: Environmental consultant - 2 wheels Additional Comments: usually does not use RW or cane, only when he absolutely needed it prior to surgery. 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 has x3 grandchildren    Prior Function Level of Independence: Independent         Comments: working doing construction/electrical work     Higher education careers adviser   Dominant Hand: Right  Extremity/Trunk Assessment   Upper Extremity Assessment Upper Extremity Assessment: Defer to OT evaluation RUE Deficits / Details: discoordinated/ataxic movements noted bil. Good strength throughout (grossly 4/5). Pt endorses  tingling in bil UEs digits to shoulder. RUE Sensation: decreased light touch;decreased proprioception RUE Coordination: decreased fine motor;decreased gross motor LUE Deficits / Details: discoordinated/ataxic movements noted bil. Good strength throughout (grossly 4/5). Pt endorses tingling in bil UEs digits to shoulder. LUE Sensation: decreased light touch;decreased proprioception LUE Coordination: decreased fine motor;decreased gross motor    Lower Extremity Assessment Lower Extremity Assessment: RLE deficits/detail;LLE deficits/detail;Generalized weakness RLE Deficits / Details: ataxia on standing, less so v. left, noting full ROM but poor eccentric control RLE Sensation: decreased light touch(tingling) RLE Coordination: decreased gross motor LLE Deficits / Details: decreased ROM in knee, unable to acheive full extension and noting abrupt contractions with poor eccentric control, worse vs. rigth LLE Sensation: decreased light touch(tingling) LLE Coordination: decreased gross motor    Cervical / Trunk Assessment Cervical / Trunk Assessment: Normal  Communication   Communication: No difficulties  Cognition Arousal/Alertness: Awake/alert Behavior During Therapy: WFL for tasks assessed/performed Overall Cognitive Status: Within Functional Limits for tasks assessed                                        General Comments General comments (skin integrity, edema, etc.): tachycardic throughout, 110-115 bpm    Exercises     Assessment/Plan    PT Assessment Patient needs continued PT services  PT Problem List Impaired sensation;Decreased coordination;Decreased mobility;Decreased balance;Decreased range of motion;Decreased strength       PT Treatment Interventions Patient/family education;Neuromuscular re-education;Balance training;Therapeutic exercise;Therapeutic activities;Functional mobility training;Stair training;Gait training;DME instruction    PT Goals (Current  goals can be found in the Care Plan section)  Acute Rehab PT Goals Patient Stated Goal: fish, back to work  PT Goal Formulation: With patient Time For Goal Achievement: 07/13/19 Potential to Achieve Goals: Good    Frequency Min 3X/week   Barriers to discharge   has wife and 27 yo son at home, needs to be more independent    Co-evaluation PT/OT/SLP Co-Evaluation/Treatment: Yes Reason for Co-Treatment: Complexity of the patient's impairments (multi-system involvement);For patient/therapist safety;To address functional/ADL transfers PT goals addressed during session: Mobility/safety with mobility;Balance;Proper use of DME OT goals addressed during session: ADL's and self-care;Strengthening/ROM       AM-PAC PT "6 Clicks" Mobility  Outcome Measure Help needed turning from your back to your side while in a flat bed without using bedrails?: A Little Help needed moving from lying on your back to sitting on the side of a flat bed without using bedrails?: A Lot Help needed moving to and from a bed to a chair (including a wheelchair)?: A Lot Help needed standing up from a chair using your arms (e.g., wheelchair or bedside chair)?: A Little Help needed to walk in hospital room?: A Lot Help needed climbing 3-5 steps with a railing? : Total 6 Click Score: 13    End of Session Equipment Utilized During Treatment: Gait belt Activity Tolerance: Patient tolerated treatment well Patient left: in chair;with call bell/phone within reach;with chair alarm set Nurse Communication: Mobility status PT Visit Diagnosis: Unsteadiness on feet (R26.81);Ataxic gait (R26.0);History of falling (Z91.81);Other symptoms and signs involving the nervous system (R29.898)    Time: 0272-5366 PT Time Calculation (min) (ACUTE ONLY): 21 min   Charges:   PT Evaluation $PT Eval Moderate  Complexity: 1 Mod          Narda Amber, PT, DPT, MS Board Certified Geriatric Clinical Specialist  Dennis Bast 06/29/2019, 12:13 PM

## 2019-06-30 ENCOUNTER — Inpatient Hospital Stay (HOSPITAL_COMMUNITY): Payer: BC Managed Care – PPO

## 2019-06-30 ENCOUNTER — Institutional Professional Consult (permissible substitution): Payer: BC Managed Care – PPO | Admitting: Diagnostic Neuroimaging

## 2019-06-30 DIAGNOSIS — M5124 Other intervertebral disc displacement, thoracic region: Secondary | ICD-10-CM | POA: Diagnosis not present

## 2019-06-30 DIAGNOSIS — G610 Guillain-Barre syndrome: Secondary | ICD-10-CM | POA: Diagnosis not present

## 2019-06-30 DIAGNOSIS — I10 Essential (primary) hypertension: Secondary | ICD-10-CM | POA: Diagnosis not present

## 2019-06-30 DIAGNOSIS — R531 Weakness: Secondary | ICD-10-CM | POA: Diagnosis not present

## 2019-06-30 DIAGNOSIS — M4804 Spinal stenosis, thoracic region: Secondary | ICD-10-CM | POA: Diagnosis not present

## 2019-06-30 LAB — CBC WITH DIFFERENTIAL/PLATELET
Abs Immature Granulocytes: 0.02 10*3/uL (ref 0.00–0.07)
Basophils Absolute: 0 10*3/uL (ref 0.0–0.1)
Basophils Relative: 0 %
Eosinophils Absolute: 0.1 10*3/uL (ref 0.0–0.5)
Eosinophils Relative: 1 %
HCT: 35.6 % — ABNORMAL LOW (ref 39.0–52.0)
Hemoglobin: 11.7 g/dL — ABNORMAL LOW (ref 13.0–17.0)
Immature Granulocytes: 0 %
Lymphocytes Relative: 23 %
Lymphs Abs: 1.6 10*3/uL (ref 0.7–4.0)
MCH: 35 pg — ABNORMAL HIGH (ref 26.0–34.0)
MCHC: 32.9 g/dL (ref 30.0–36.0)
MCV: 106.6 fL — ABNORMAL HIGH (ref 80.0–100.0)
Monocytes Absolute: 0.4 10*3/uL (ref 0.1–1.0)
Monocytes Relative: 6 %
Neutro Abs: 4.9 10*3/uL (ref 1.7–7.7)
Neutrophils Relative %: 70 %
Platelets: 408 10*3/uL — ABNORMAL HIGH (ref 150–400)
RBC: 3.34 MIL/uL — ABNORMAL LOW (ref 4.22–5.81)
RDW: 13.5 % (ref 11.5–15.5)
WBC: 7 10*3/uL (ref 4.0–10.5)
nRBC: 0 % (ref 0.0–0.2)

## 2019-06-30 LAB — CSF CELL COUNT WITH DIFFERENTIAL
RBC Count, CSF: 0 /mm3
Tube #: 3
WBC, CSF: 1 /mm3 (ref 0–5)

## 2019-06-30 LAB — COMPREHENSIVE METABOLIC PANEL
ALT: 37 U/L (ref 0–44)
AST: 46 U/L — ABNORMAL HIGH (ref 15–41)
Albumin: 3 g/dL — ABNORMAL LOW (ref 3.5–5.0)
Alkaline Phosphatase: 75 U/L (ref 38–126)
Anion gap: 10 (ref 5–15)
BUN: <5 mg/dL — ABNORMAL LOW (ref 6–20)
CO2: 26 mmol/L (ref 22–32)
Calcium: 8.9 mg/dL (ref 8.9–10.3)
Chloride: 99 mmol/L (ref 98–111)
Creatinine, Ser: 0.77 mg/dL (ref 0.61–1.24)
GFR calc Af Amer: >60 mL/min (ref >60)
GFR calc non Af Amer: >60 mL/min (ref >60)
Glucose, Bld: 138 mg/dL — ABNORMAL HIGH (ref 70–99)
Potassium: 3.9 mmol/L (ref 3.5–5.1)
Sodium: 135 mmol/L (ref 135–145)
Total Bilirubin: 0.7 mg/dL (ref 0.3–1.2)
Total Protein: 7.1 g/dL (ref 6.5–8.1)

## 2019-06-30 LAB — PHOSPHORUS: Phosphorus: 4.4 mg/dL (ref 2.5–4.6)

## 2019-06-30 LAB — PROTEIN AND GLUCOSE, CSF
Glucose, CSF: 82 mg/dL — ABNORMAL HIGH (ref 40–70)
Total  Protein, CSF: 102 mg/dL — ABNORMAL HIGH (ref 15–45)

## 2019-06-30 LAB — MAGNESIUM: Magnesium: 1.8 mg/dL (ref 1.7–2.4)

## 2019-06-30 LAB — RPR: RPR Ser Ql: NONREACTIVE

## 2019-06-30 LAB — HIV ANTIBODY (ROUTINE TESTING W REFLEX): HIV Screen 4th Generation wRfx: NONREACTIVE

## 2019-06-30 LAB — PROTIME-INR
INR: 0.8 (ref 0.8–1.2)
Prothrombin Time: 11.2 seconds — ABNORMAL LOW (ref 11.4–15.2)

## 2019-06-30 MED ORDER — ACETAMINOPHEN 325 MG PO TABS
650.0000 mg | ORAL_TABLET | ORAL | Status: DC | PRN
  Administered 2019-07-02 – 2019-07-05 (×5): 650 mg via ORAL
  Filled 2019-06-30 (×4): qty 2

## 2019-06-30 MED ORDER — DIPHENHYDRAMINE HCL 25 MG PO CAPS
25.0000 mg | ORAL_CAPSULE | ORAL | Status: DC | PRN
Start: 2019-06-30 — End: 2019-07-05
  Administered 2019-06-30 – 2019-07-04 (×5): 25 mg via ORAL
  Filled 2019-06-30 (×5): qty 1

## 2019-06-30 MED ORDER — HEPARIN SODIUM (PORCINE) 5000 UNIT/ML IJ SOLN
5000.0000 [IU] | Freq: Three times a day (TID) | INTRAMUSCULAR | Status: DC
  Administered 2019-06-30 – 2019-07-07 (×20): 5000 [IU] via SUBCUTANEOUS
  Filled 2019-06-30 (×21): qty 1

## 2019-06-30 MED ORDER — GADOBUTROL 1 MMOL/ML IV SOLN
10.0000 mL | Freq: Once | INTRAVENOUS | Status: AC | PRN
  Administered 2019-06-30: 10 mL via INTRAVENOUS

## 2019-06-30 MED ORDER — IMMUNE GLOBULIN (HUMAN) 10 GM/100ML IV SOLN
400.0000 mg/kg | INTRAVENOUS | Status: AC
  Administered 2019-06-30 – 2019-07-04 (×5): 45 g via INTRAVENOUS
  Filled 2019-06-30 (×4): qty 400
  Filled 2019-06-30: qty 200

## 2019-06-30 MED ORDER — HYDRALAZINE HCL 50 MG PO TABS
100.0000 mg | ORAL_TABLET | Freq: Three times a day (TID) | ORAL | Status: DC
  Administered 2019-06-30 – 2019-07-07 (×21): 100 mg via ORAL
  Filled 2019-06-30 (×22): qty 2

## 2019-06-30 NOTE — Procedures (Signed)
Indication: AIDP  Risks of the procedure were dicussed with the patient including post-LP headache, bleeding, infection, weakness/numbness of legs(radiculopathy), death.  The patient agreed and written consent was obtained.   The patient was prepped and draped, and using sterile technique a 20 gauge quinke spinal needle was inserted in the L3-4 space.  Approximately 14 cc of CSF were obtained and sent for analysis. 5 attempts made.  Felicie Morn PA-C [facility] [phone]  M-F  (9:00 am- 5:00 PM)  06/30/2019, 3:00 PM

## 2019-06-30 NOTE — Progress Notes (Signed)
PT Cancellation Note  Patient Details Name: Jorge Weaver. MRN: [id number] DOB: May 24, 1965   Cancelled Treatment:    Reason Eval/Treat Not Completed: Patient not medically ready;Other (comment)(bedrest 4 hrs post LP.).  Will try to see as able 4/13. 06/30/2019  Jacinto Halim., PT Acute Rehabilitation Services [phone]  (pager) [phone]  (office)   [name] 06/30/2019, 5:30 PM

## 2019-06-30 NOTE — Consult Note (Signed)
Physical Medicine and Rehabilitation Consult   Reason for Consult: Functional decline due to progressive weakness/numbness.  Referring Physician: Dr. Thedore Mins   HPI: Jorge Weaver. is a 54 y.o. male with history of HTN, cervical spondylosis with myelopathy s/p decompression 12/2018, ETOH use who was admitted on 06/29/19 with increasing numbness in BUE/BLE with recurrent falls, progressing to umbilicus as well as face and hesitancy. He has needed to use walker/cane for past couple of weeks.   He had severe hypokalemia --K+2.2 which was supplemented. MRI brain showed no acute abnormality. MRI cervical spine showed ACDF C5-C7 with resolved stenosis and regressed spinal cord signal and myelomalacia, moderate neural foraminal stenosis C5 and C8 nerve roots and severe at left C6 nerve level. MRI lumbar spine showed progression of DD L4/L5 with new right foraminal anular fissure and moderate right L4 foraminal stenosis and advanced L5/S1 degeneration with moderate to severe L5 foraminal stenosis.  No surgical intervention needed per input from Dr. Maisie Fus.   Dr. Laurence Slate consulted for input and recommended full work with IV thiamine due to history of alcohol abuse and evidence of macrocytic anemia--to rule out GBS, syphilis as well as nutritional causes. Patient with decreased sensation/ proprioception from umbilicus down and significant truncal ataxia with dysmetria.  MRI of thoracic spine ordered showing mild to moderate spinal stenosis at T9/T10 and T10/T11 without cord abnormality. B12- 571. TSH-1.5. Cortisol- 12.0. Folate low at 3.2. POTASSIUM 2.2.   Thiamine/HIV/RPR, heavy metals pending.  The prevailing thought is that his exremely low potassium led to presentation as his symptoms have been improving. Therapy evaluations done revealing ataxia with BLE weakness affecting functional status and CIR recommended for follow up therapy.     Review of Systems  Constitutional: Negative for fever.  HENT:  Negative for hearing loss and tinnitus.   Eyes: Negative for blurred vision and double vision.  Respiratory: Negative for cough and shortness of breath.   Cardiovascular: Negative for chest pain, palpitations and leg swelling.  Gastrointestinal: Positive for heartburn. Negative for abdominal pain.  Musculoskeletal: Positive for back pain, falls and joint pain (left hip). Negative for neck pain.  Skin: Negative for rash.  Neurological: Positive for sensory change (Numbness bilateral hands improved some past Cervical decompression. New symptoms BLE for 3-4 months) and weakness.  Psychiatric/Behavioral: The patient has insomnia.     Past Medical History:  Diagnosis Date  . Anemia    low iron  . Asthma    as a child  . Cervical spondylosis with myelopathy   . Fatty liver   . GERD (gastroesophageal reflux disease)   . Heart murmur    when he was younger   . Hypertension   . Pneumonia     Past Surgical History:  Procedure Laterality Date  . ANTERIOR CERVICAL DECOMP/DISCECTOMY FUSION N/A 01/16/2019   Procedure: Cervical six corpectomy with Cervical five-seven fusion and plating;  Surgeon: Coletta Memos, MD;  Location: [facility] OR;  Service: Neurosurgery;  Laterality: N/A;  . NO PAST SURGERIES    . WISDOM TOOTH EXTRACTION    . WRIST SURGERY Left 2014   fx    Family History  Problem Relation Age of Onset  . Hypertension Mother   . Heart attack Father   . Colon cancer Sister   . Esophageal cancer Neg Hx   . Rectal cancer Neg Hx   . Stomach cancer Neg Hx     Social History:  Married.  Maintenance supervisor.  He reports that he has  never smoked. He has never used smokeless tobacco. He reports current alcohol use of about 6 pack beer day. He reports that he does not use drugs.    Allergies: No Known Allergies    Medications Prior to Admission  Medication Sig Dispense Refill  . acetaminophen (TYLENOL) 500 MG tablet Take 1,000 mg by mouth See admin instructions. Take 1,000 mg by  mouth two to five times a day as needed for pain    . amLODipine (NORVASC) 10 MG tablet Take 1 tablet (10 mg total) by mouth daily. 90 tablet 3  . famotidine (PEPCID) 20 MG tablet Take 1 tablet (20 mg total) by mouth 2 (two) times daily. (Patient not taking: Reported on 06/27/2019) 60 tablet 2  . levocetirizine (XYZAL) 5 MG tablet Take 1 tablet (5 mg total) by mouth every evening. (Patient not taking: Reported on 06/27/2019) 30 tablet 2  . metoprolol succinate (TOPROL-XL) 50 MG 24 hr tablet Take 1 tablet (50 mg total) by mouth daily. Take with or immediately following a meal. (Patient not taking: Reported on 06/27/2019) 90 tablet 3  . potassium chloride (KLOR-CON 10) 10 MEQ tablet Take 2 tablets (20 mEq total) by mouth daily. (Patient not taking: Reported on 06/27/2019) 180 tablet 0  . tamsulosin (FLOMAX) 0.4 MG CAPS capsule Take 0.4 mg by mouth.      Home: Home Living Family/patient expects to be discharged to:: Private residence Living Arrangements: Spouse/significant other, Children Available Help at Discharge: Family Type of Home: House Home Access: Stairs to enter Secretary/administrator of Steps: 9 Entrance Stairs-Rails: Right Home Layout: One level, Laundry or work area in basement [employer] Shower/Tub: Engineer, manufacturing systems: Standard Home Equipment: Environmental consultant - 2 wheels Additional Comments: usually does not use RW or cane, only when he absolutely needed it prior to surgery. 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 has x3 grandchildren  Functional History: Prior Function Level of Independence: Independent Comments: working doing Mudlogger work Functional Status:  Mobility: Bed Mobility Overal bed mobility: Needs Assistance Bed Mobility: Supine to Sit Supine to sit: +2 for safety/equipment, Mod assist General bed mobility comments: for trunk elevation and for initial stability Transfers Overall transfer level: Needs  assistance Equipment used: Rolling walker (2 wheeled) Transfers: Sit to/from Stand, [employer] Transfers Sit to Stand: Min guard, +2 safety/equipment Stand pivot transfers: Min assist, +2 physical assistance, +2 safety/equipment General transfer comment: pt reports he feel like he's 'being lifted up' associated with ataxic instability of posture; decr advancement of limbs for transfer to chair with more difficulty position LLE v. RLE Ambulation/Gait General Gait Details: defered due to pt weakness    ADL: ADL Overall ADL's : Needs assistance/impaired Eating/Feeding: Set up, Sitting Eating/Feeding Details (indicate cue type and reason): will benefit from further assessment given ataxic/discoordinated movements Grooming: Minimal assistance, Sitting Upper Body Bathing: Minimal assistance, Sitting Lower Body Bathing: Maximal assistance, +2 for physical assistance, Sit to/from stand Upper Body Dressing : Minimal assistance, Sitting Upper Body Dressing Details (indicate cue type and reason): donning new gown Lower Body Dressing: Maximal assistance, +2 for physical assistance, Sit to/from stand Lower Body Dressing Details (indicate cue type and reason): minA+2 standing balance; pt able to reach and pull socks higher up on his feet but requiring increased time/effort to perform fine motor task. suspect will require increased assist to fully don/doff socks Toilet Transfer: Minimal assistance, +2 for physical assistance, RW Toilet Transfer Details (indicate cue type and reason): simulated via transfer to recliner, VCs  for sequencing steps with RW Toileting- Clothing Manipulation and Hygiene: Moderate assistance, Sit to/from stand Functional mobility during ADLs: Minimal assistance, +2 for physical assistance, Rolling walker  Cognition: Cognition Overall Cognitive Status: Within Functional Limits for tasks assessed Orientation Level: Oriented X4 Cognition Arousal/Alertness:  Awake/alert Behavior During Therapy: WFL for tasks assessed/performed Overall Cognitive Status: Within Functional Limits for tasks assessed  Blood pressure (!) 129/114, pulse (!) 109, temperature 98.2 F (36.8 C), resp. rate 20, height 5\' 11"  (1.803 m), weight 112.4 kg, SpO2 95 %. Physical Exam  Nursing note and vitals reviewed. Constitutional: He is oriented to person, place, and time. He appears well-developed and well-nourished.  Respiratory: No stridor.  Coarse breath sounds LUL and BLL. Occasional congested cough noted.   GI: He exhibits no distension.  Musculoskeletal:        General: No edema.     Comments: Dry dressing on right great toe.   Neurological: He is alert and oriented to person, place, and time.    No results found for this or any previous visit (from the past 24 hour(s)). MR THORACIC SPINE W WO CONTRAST  Result Date: 06/30/2019 CLINICAL DATA:  A shin initial evaluation for 1 month history of progressive weakness. EXAM: MRI THORACIC WITHOUT AND WITH CONTRAST TECHNIQUE: Multiplanar and multiecho pulse sequences of the thoracic spine were obtained without and with intravenous contrast. CONTRAST:  46mL GADAVIST GADOBUTROL 1 MMOL/ML IV SOLN COMPARISON:  Prior MRI from 01/02/2019. FINDINGS: MRI THORACIC SPINE FINDINGS Alignment: Examination moderately degraded by motion artifact. Mild dextroscoliosis. Alignment otherwise normal with preservation of the normal thoracic kyphosis. No listhesis. Vertebrae: Vertebral body height maintained without evidence for acute or chronic fracture. Bone marrow signal intensity within normal limits. Few scattered subcentimeter benign hemangiomata noted. No worrisome osseous lesions. No abnormal marrow edema or enhancement. Cord: No definite cord signal abnormality seen on this motion degraded exam. Cord caliber and morphology within normal limits. No appreciable abnormal enhancement. Paraspinal and other soft tissues: Paraspinous soft tissues  demonstrate no acute finding. Partially visualized lungs are clear. Exophytic T2 hyperintense simple cyst partially visualized extending from the posterior right kidney. Visualized visceral structures otherwise unremarkable. Disc levels: T1-2:  Unremarkable. T2-3: Negative interspace. Left-sided facet hypertrophy. No spinal stenosis. Mild left foraminal narrowing. T3-4: Diffuse disc bulge with superimposed left foraminal disc protrusion (series 19, image 8). Right greater than left facet hypertrophy. Resultant mild spinal stenosis without cord deformity. Mild left foraminal narrowing. T4-5: Mild disc bulge. Posterior element hypertrophy. No significant spinal stenosis. Foramina remain patent. T5-6: Tiny right paracentral disc protrusion (series 19, image 14). Posterior element hypertrophy. Mild prominence of the dorsal epidural fat. No significant spinal stenosis. Foramina remain patent. T6-7: Diffuse disc bulge. Superimposed small left paracentral disc protrusion mildly indents the left ventral thecal sac (series 19, image 17). Posterior element hypertrophy. Prominence of the dorsal epidural fat. No significant stenosis. T7-8: Prominence of the dorsal epidural fat with mild facet hypertrophy. Otherwise negative. No stenosis. T8-9: Minimal disc bulge. Posterior element hypertrophy with prominence of the dorsal epidural fat. No stenosis. T9-10: Mild diffuse disc bulge. Moderate bilateral facet hypertrophy. Resultant mild spinal stenosis. Moderate left with mild right foraminal narrowing. T10-11: Diffuse disc bulge. Right greater than left facet hypertrophy. Resultant mild to moderate spinal stenosis (series 19, image 29). Mild left with moderate right foraminal narrowing. T11-12: Minimal disc bulge. Bilateral facet hypertrophy. No significant stenosis. T12-L1: Mild facet hypertrophy. Otherwise unremarkable. No stenosis. IMPRESSION: 1. Technically limited exam due to motion artifact. 2. Grossly  normal MRI appearance  of the thoracic cord with no cord signal abnormality identified. No acute abnormality or findings to explain patient's symptoms identified. 3. Multilevel degenerative disc bulging with facet hypertrophy as above. Resultant mild to moderate spinal stenosis at T9-10 and T10-11. Electronically Signed   By: Jeannine Boga M.D.   On: 06/30/2019 05:23     Assessment/Plan: Diagnosis: Bilateral upper and lower extremity acute on chronic weakness with associated sensory loss and ataxia in a patient with multilevel spondylosis of cervical and lumbar spine and prior ACDF. He has chronic cord signals, myelomalacia evident on most recent cervical MRI. Acute weakness is felt to be related to severe hypokalemia. Some lab work still pending 1. Does the need for close, 24 hr/day medical supervision in concert with the patient's rehab needs make it unreasonable for this patient to be served in a less intensive setting? Yes 2. Co-Morbidities requiring supervision/potential complications: pain mgt, bowel/bladder, htn etoh abuse/nutiritonal deficiency.  3. Due to bladder management, bowel management, safety, skin/wound care, disease management, medication administration, pain management and patient education, does the patient require 24 hr/day rehab nursing? Yes 4. Does the patient require coordinated care of a physician, rehab nurse, therapy disciplines of PT, OT to address physical and functional deficits in the context of the above medical diagnosis(es)? Yes, No, Potentially and N/A Addressing deficits in the following areas: balance, endurance, locomotion, strength, transferring, bowel/bladder control, bathing, dressing, feeding, grooming, toileting and psychosocial support 5. Can the patient actively participate in an intensive therapy program of at least 3 hrs of therapy per day at least 5 days per week? Yes 6. The potential for patient to make measurable gains while on inpatient rehab is  excellent 7. Anticipated functional outcomes upon discharge from inpatient rehab are modified independent and supervision  with PT, modified independent, supervision and min assist with OT, n/a with SLP. 8. Estimated rehab length of stay to reach the above functional goals is: 14-19 days 9. Anticipated discharge destination: Home 10. Overall Rehab/Functional Prognosis: excellent  RECOMMENDATIONS: This patient's condition is appropriate for continued rehabilitative care in the following setting: CIR Patient has agreed to participate in recommended program. N/A Note that insurance prior authorization may be required for reimbursement for recommended care.  Comment: Rehab Admissions Coordinator to follow up.  Thanks,  Meredith Staggers, MD, Mellody Drown   I have reviewed and concur with the physician assistant's documentation above.    Jorge Leriche, PA-C 06/30/2019

## 2019-06-30 NOTE — Progress Notes (Addendum)
                                      [facility]                            [address] [place], [place] [address]        Jorge Weaver DOB Dec 16, 1965 was admitted to the Hospital on 06/27/2019 and is still admitted at the hospital, he will require prolonged rehabilitation after his hospital discharge and is still few days away from his actual hospital discharge.  He cannot be released to work at this time, actual return to work date will be determined at unknown date depending on his hospital course and the post hospitalization rehabilitation course.  Call Susa Raring MD, [facility]  [phone] with questions.  Susa Raring M.D on 06/30/2019,at 5:30 PM  [facility]   Office  [phone]

## 2019-06-30 NOTE — Progress Notes (Signed)
Subjective: Feels better since K was improved, but still with uncoordinated movements.   Exam: Vitals:   06/29/19 2001 06/30/19 0744  BP: (!) 159/114 (!) 129/114  Pulse: (!) 101 (!) 109  Resp: 20   Temp: 98.3 F (36.8 C) 98.2 F (36.8 C)  SpO2: 100% 95%   Gen: In bed, NAD Resp: non-labored breathing, no acute distress Abd: soft, nt  Neuro: MS: awake, alert,  CN: PERRL, EOMI Motor: 5/5 Sensory: diminished temp and proprioception distally.  JGJ:GMLVXB.  Pertinent Labs: b12 571 tsh nml k 2.2 -> 2.5 -> 3.3 -> 3.9   Impression: 54 yo M with acute sensory ataxia/sensation loss over the past 4 weeks. He has a history of myelopathy, and was documented is having brisk reflexes in his last neurologist visit, but currently has areflexia.  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  Recommendations: 1) B1, MMA, heavy metals pending 2) LP to assess for albuminocytologic dissociation 3) check phosphorus  4) neurology to continue following  Ritta Slot, MD [facility] [phone]  If 7pm- 7am, please page neurology on call as listed in [facility].

## 2019-06-30 NOTE — Progress Notes (Signed)
LP with evidence of albuminocytologic dissociation. 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  He has had symptoms for multiple weeks, and I would favor IVIG at this time.   Ritta Slot, MD [facility] [phone]  If 7pm- 7am, please page neurology on call as listed in [facility]ION.

## 2019-06-30 NOTE — Progress Notes (Signed)
Informed pharmacy IV Team is available to administer IVIG tonight. Pharmacist will enter consult when medication is ready. Pt will need pre-meds prior to administration.

## 2019-06-30 NOTE — Progress Notes (Signed)
Inpatient Rehab Admissions:  Inpatient Rehab Consult received.  I met with pt at the bedside for rehabilitation assessment and to discuss goals and expectations of an inpatient rehab admission. Pt was lethargic but appeared interested in CIR program and acknowledged understanding of goals and expectations.  Also called pt's wife, [name].  She acknowledged understanding of CIR's goals and expectations. Wife verified there is a reliable d/c plan.  Will begin insurance authorization process for possible admission to CIR.   Signed: Gayland Curry, Deputy, [facility]missions Coordinator [phone]

## 2019-06-30 NOTE — Progress Notes (Signed)
PROGRESS NOTE                                                                                                                                                                                                             Patient Demographics:    Jorge Weaver, is a 54 y.o. male, DOB - 1965-05-24, QTM:[id number]  Admit date - 06/27/2019   Admitting Physician Jorge Clos, MD  Outpatient Primary MD for the patient is Jorge Roller [name], DO  LOS - 2  Chief Complaint  Patient presents with  . Weakness       Brief Narrative  - 54 y.o. male with history of hypertension previous alcohol abuse which patient states has stopped drinking for almost a month now who has had a surgery in October 2024 cervical 6 corpectomy and C5 7 arthrodesis by Jorge. Franky Weaver presents to the ER because of increasing tightness of the extremities difficulty to move difficulty walking and increasing numbness of the upper and lower extremities.  Denies any nausea vomiting diarrhea headache chest pain.  Has been having recurrent falls.  In the ER his MRI brain and C-spine were nonacute, this was discussed by the admitting physician with neurologist on-call at night Jorge Weaver, in the morning I discussed his MRI findings with neurosurgeon Jorge Weaver.  No surgical intervention or medical intervention needed according to the specialist.  Incidentally he was found to have life-threatening low severe hypokalemia along with ongoing weakness and admitted to the hospital.   Subjective:   Patient in bed denies any headache, no chest or abdominal pain, his weakness is improving including on the left side of his face.   Assessment  & Plan :     1.  Generalized subacute weakness worse in the left upper extremity and left side of the face ongoing for several weeks in a patient with history of C-spine fusion surgery.  Case discussed by admitting physician with neurologist Jorge Weaver and me with neurosurgeon Jorge Weaver.  No  neurological intervention needed.  His symptoms most likely are due to severe hypokalemia causing picture close to periodic paralysis.  With aggressive replacement of potassium his symptoms have improved quite a bit, neurology is following and will use other immunological tests including copper, serum plasma and nutritional deficiency tests have been ordered, he has been seen by PT OT and requalify for CIR.  Consult placed.  Continue to monitor.  Work-up underway.   2.  Multiple falls at home with lip and tongue  injury during a fall along with avulsed right big toe nail.  Due to muscular weakness from #1 above, supportive care and monitor.  3. HTN - on Norvasc, Lopressor blood pressure still running high have increased hydralazine dose for better control.  4.  History of alcohol abuse.  Counseled to quit.  Placed on folic acid and thiamine.  Last drink 1 month ago.    Family Communication  :  Wife Jorge Weaver - [phone], 06/28/19, 06/20/19, 06/21/19  Code Status : Full  Disposition Plan  :  Stay in the hospital for SEVERE Hypokalemia with periodic paralysis  Consults  :  Neurology Jorge Weaver and Jorge Weaver over the phone - 06/28/19  Procedures  :    MRI Brain - C Spine -   1. 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 2. Status post ACDF at C5-C6 and C6-C7. Resolved spinal stenosis at C6-C7, and regressed although not resolved abnormal spinal cord signal and enhancement centered at that level. 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 3. Mild cervical spinal stenosis elsewhere, including some residual cord mass effect related to C5 endplate spurring, but no other abnormal cord signal is identified. 4. Degenerative neural foraminal stenosis, up to moderate at the left C5 and C8 nerve levels, and severe at the left C6 nerve level.   No acute intracranial abnormality and largely unremarkable for age MRI appearance of the  brain.   DVT Prophylaxis  :   Heparin    Lab Results  Component Value Date   PLT 372 06/29/2019    Diet :  Diet Order            Diet Heart Room service appropriate? Yes; Fluid consistency: Thin  Diet effective now               Inpatient Medications  Scheduled Meds: . amLODipine  10 mg Oral Daily  . folic acid  1 mg Oral Daily  . gabapentin  300 mg Oral TID  . heparin injection (subcutaneous)  5,000 Units Subcutaneous Q8H  . hydrALAZINE  100 mg Oral Q8H  . metoprolol tartrate  100 mg Oral BID   Continuous Infusions: . thiamine injection 500 mg (06/30/19 0839)   PRN Meds:.acetaminophen, hydrALAZINE, lip balm, [DISCONTINUED] ondansetron **OR** ondansetron (ZOFRAN) IV, zolpidem  Antibiotics  :   Anti-infectives (From admission, onward)   None        Objective:   Vitals:   06/29/19 0749 06/29/19 1735 06/29/19 2001 06/30/19 0744  BP: (!) 159/117 (!) 144/115 (!) 159/114 (!) 129/114  Pulse: (!) 104 (!) 101 (!) 101 (!) 109  Resp: 18 16 20    Temp: 98.9 F (37.2 C) 98.1 F (36.7 C) 98.3 F (36.8 C) 98.2 F (36.8 C)  TempSrc:      SpO2: 96% 97% 100% 95%  Weight:      Height:        SpO2: 95 %  Wt Readings from Last 3 Encounters:  06/28/19 112.4 kg  06/16/19 122.5 kg  06/09/19 122.2 kg     Intake/Output Summary (Last 24 hours) at 06/30/2019 7169 Last data filed at 06/30/2019 6789 Gross per 24 hour  Intake 400 ml  Output 1800 ml  Net -1400 ml     Physical Exam  Awake Alert, No new F.N deficits, generalized weakness in all 4 extremities with strength 5/5 but somewhat weaker in the left arm as compared to other limbs but this is chronic according to patient,  mild left-sided facial droop which is also been present for a few weeks according to the wife [place].AT,PERRAL Supple Neck,No JVD, No cervical lymphadenopathy appriciated.  Symmetrical Chest wall movement, Good air movement bilaterally, CTAB RRR,No Gallops, Rubs or new Murmurs, No Parasternal  Heave +ve B.Sounds, Abd Soft, No tenderness, No organomegaly appriciated, No rebound - guarding or rigidity. No Cyanosis, Clubbing or edema, No new Rash or bruise    Data Review:    Recent Labs  Lab 06/27/19 1735 06/28/19 0551 06/29/19 0533  WBC 3.8* 4.8 7.3  HGB 12.1* 10.9* 12.0*  HCT 36.9* 32.5* 35.9*  PLT 374 363 372  MCV 107.3* 104.2* 105.0*  MCH 35.2* 34.9* 35.1*  MCHC 32.8 33.5 33.4  RDW 13.8 13.4 13.4  LYMPHSABS  --  1.2 1.8  MONOABS  --  0.6 0.6  EOSABS  --  0.0 0.1  BASOSABS  --  0.0 0.0    Recent Labs  Lab 06/27/19 1735 06/27/19 1846 06/27/19 2346 06/28/19 0551 06/28/19 1429 06/29/19 0533  NA 136  --   --  137  --  136  K 2.2*  --   --  2.5* 3.5 3.3*  CL 89*  --   --  92*  --  96*  CO2 33*  --   --  35*  --  28  GLUCOSE 122*  --   --  119*  --  110*  BUN 6  --   --  6  --  <5*  CREATININE 0.82  --   --  0.79  --  0.77  CALCIUM 8.7*  --   --  8.5*  --  9.1  AST  --   --   --  51*  --  54*  ALT  --   --   --  35  --  38  ALKPHOS  --   --   --  75  --  78  BILITOT  --   --   --  1.2  --  0.4  ALBUMIN  --   --   --  3.0*  --  3.1*  MG  --  1.7  --   --   --  1.8  TSH  --   --  1.777 1.501  --   --   BNP  --   --   --   --   --  48.2    Recent Labs  Lab 06/28/19 0042 06/29/19 0533  BNP  --  48.2  SARSCOV2NAA NEGATIVE  --     ------------------------------------------------------------------------------------------------------------------ No results for input(s): CHOL, HDL, LDLCALC, TRIG, CHOLHDL, LDLDIRECT in the last 72 hours.  Lab Results  Component Value Date   HGBA1C 6.1 08/07/2017   ------------------------------------------------------------------------------------------------------------------ Recent Labs    06/28/19 0551  TSH 1.501   ------------------------------------------------------------------------------------------------------------------ Recent Labs    06/28/19 0551  VITAMINB12 571  FOLATE 3.2*  FERRITIN 803*  TIBC  235*  IRON 40*  RETICCTPCT 2.1    Coagulation profile No results for input(s): INR, PROTIME in the last 168 hours.  No results for input(s): DDIMER in the last 72 hours.  Cardiac Enzymes No results for input(s): CKMB, TROPONINI, MYOGLOBIN in the last 168 hours.  Invalid input(s): CK ------------------------------------------------------------------------------------------------------------------    Component Value Date/Time   BNP 48.2 06/29/2019 0533    Micro Results Recent Results (from the past 240 hour(s))  SARS CORONAVIRUS 2 (TAT 6-24 HRS) Nasopharyngeal Nasopharyngeal Swab     Status: None  Collection Time: 06/28/19 12:42 AM   Specimen: Nasopharyngeal Swab  Result Value Ref Range Status   SARS Coronavirus 2 NEGATIVE NEGATIVE Final    Comment: (NOTE) SARS-CoV-2 target nucleic acids are NOT DETECTED. The SARS-CoV-2 RNA is generally detectable in upper and lower respiratory specimens during the acute phase of infection. Negative results do not preclude SARS-CoV-2 infection, do not rule out co-infections with other pathogens, and should not be used as the sole basis for treatment or other patient management decisions. Negative results must be combined with clinical observations, patient history, and epidemiological information. The expected result is Negative. Fact Sheet for Patients: HairSlick.no Fact Sheet for Healthcare Providers: quierodirigir.com This test is not yet approved or cleared by the [place] FDA and  has been authorized for detection and/or diagnosis of SARS-CoV-2 by FDA under an Emergency Use Authorization (EUA). This EUA will remain  in effect (meaning this test can be used) for the duration of the COVID-19 declaration under Section 56 4(b)(1) of the Act, 21 U.S.C. section 360bbb-3(b)(1), unless the authorization is terminated or revoked sooner. Performed at [facility], [address], [place], [place] [address]     Radiology Reports MR Brain W and Wo Contrast  Result Date: 06/27/2019 CLINICAL DATA:  54 year old male with increasing neurologic deficits, involuntary twitches and spasms. Status post cervical spine ACDF on 01/16/2019. EXAM: MRI HEAD WITHOUT AND WITH CONTRAST TECHNIQUE: Multiplanar, multiecho pulse sequences of the brain and surrounding structures were obtained without and with intravenous contrast. CONTRAST:  25mL GADAVIST GADOBUTROL 1 MMOL/ML IV SOLN COMPARISON:  No prior head MRI.  Head CT 10/15/2013. FINDINGS: Brain: Cerebral volume is similar to that in 2015. No restricted diffusion to suggest acute infarction. No midline shift, mass effect, evidence of mass lesion, ventriculomegaly, extra-axial collection or acute intracranial hemorrhage. Cervicomedullary junction and pituitary are within normal limits. Largely normal for age gray and white matter signal, minimal scattered nonspecific cerebral white matter T2 and FLAIR hyperintensity. No cortical encephalomalacia or chronic cerebral blood products. No abnormal enhancement identified. No dural thickening. Vascular: Major intracranial vascular flow voids are preserved. The major dural venous sinuses are enhancing and appear to be patent. Skull and upper cervical spine: Cervical spine details are reported separately. Visualized bone marrow signal is within normal limits. Sinuses/Orbits: Negative orbits. Paranasal sinuses and mastoids are stable and well pneumatized. Other: Scalp and face soft tissues appear negative. There is a tiny right nasopharyngeal retention cyst, inconsequential. IMPRESSION: No acute intracranial abnormality and largely unremarkable for age MRI appearance of the brain. Electronically Signed   By: Odessa Fleming M.D.   On: 06/27/2019 22:32   MR Cervical Spine W or Wo Contrast  Result Date: 06/27/2019 CLINICAL DATA:  54 year old male with increasing neurologic deficits, involuntary twitches and spasms.  Status post cervical spine ACDF on 01/16/2019. EXAM: MRI CERVICAL SPINE WITHOUT AND WITH CONTRAST TECHNIQUE: Multiplanar and multiecho pulse sequences of the cervical spine, to include the craniocervical junction and cervicothoracic junction, were obtained without and with intravenous contrast. CONTRAST:  83mL GADAVIST GADOBUTROL 1 MMOL/ML IV SOLN COMPARISON:  Preoperative cervical spine MRI with contrast on 01/16/2019. And outside preoperative cervical spine without contrast 01/02/2019, now available on [employer]. FINDINGS: Alignment: Straightening of cervical lordosis, less reversal compared to October. There may be mild residual retrolisthesis of C5 on C6. Vertebrae: Mild hardware susceptibility artifact from C5-C6 and C6-C7 ACDF, new from prior MRI. No marrow edema or evidence of acute osseous abnormality. Background bone marrow signal within normal  limits. Cord: The spinal cord remains abnormal at C6-C7, although the extent of abnormal cord T2 signal is regressed compared to 01/02/2019, where the T2 abnormality continued to the C7-T1 level. Patchy abnormal T2 signal is now confined to the C6-C7 level, and on axial images appears to spare only the most peripheral cord white matter (series 9, image 33). The cord also appears less expanded. And following contrast there is only a faint 5 mm focus of residual enhancement (9 mm and more intensely enhancing previously). Above and below that level cord signal appears to remain normal. No other abnormal intradural enhancement. No dural thickening. Posterior Fossa, vertebral arteries, paraspinal tissues: Cervicomedullary junction is within normal limits. Preserved major vascular flow voids in the neck. Negative visible neck soft tissues. Disc levels: There is a congenital degree of cervical spinal canal narrowing related to short pedicles, with superimposed degenerative changes: C2-C3: Mild disc bulge and endplate spurring plus ligament flavum hypertrophy. Mild spinal  stenosis but no cord mass effect (series 10, image 6). C3-C4: Mild disc bulge and ligament flavum hypertrophy. Mild spinal stenosis but no cord mass effect. C4-C5: Circumferential disc bulge and mild endplate spurring. Less ligament flavum hypertrophy here. Mild facet hypertrophy. Mild spinal stenosis. No cord mass effect. Mild to moderate C5 foraminal stenosis greater on the left. C5-C6: Interval ACDF. But there does appear to be residual spinal stenosis with mild cord mass effect at the C5 inferior endplate level (series 11, image 27). No cord signal abnormality. Endplate spurring results in severe left and mild right C6 foraminal stenosis (same image). C6-C7: Interval ACDF. Ligament flavum hypertrophy but no significant spinal stenosis. Foraminal patency also appears improved, mild residual stenosis on the right. C7-T1: Mostly foraminal disc bulge and endplate spurring. No spinal stenosis. Moderate left and mild right C8 foraminal stenosis. No upper thoracic spinal stenosis. IMPRESSION: 1. 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 2. Status post ACDF at C5-C6 and C6-C7. Resolved spinal stenosis at C6-C7, and regressed although not resolved abnormal spinal cord signal and enhancement centered at that level. 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 3. Mild cervical spinal stenosis elsewhere, including some residual cord mass effect related to C5 endplate spurring, but no other abnormal cord signal is identified. 4. Degenerative neural foraminal stenosis, up to moderate at the left C5 and C8 nerve levels, and severe at the left C6 nerve level. Preliminary results of this exam were discussed by telephone with Jorge. [place] Sheldon in the ED. Electronically Signed   By: Odessa Fleming M.D.   On: 06/27/2019 22:47   MR THORACIC SPINE W WO CONTRAST  Result Date: 06/30/2019 CLINICAL DATA:  A shin initial evaluation for 1 month history of progressive  weakness. EXAM: MRI THORACIC WITHOUT AND WITH CONTRAST TECHNIQUE: Multiplanar and multiecho pulse sequences of the thoracic spine were obtained without and with intravenous contrast. CONTRAST:  10mL GADAVIST GADOBUTROL 1 MMOL/ML IV SOLN COMPARISON:  Prior MRI from 01/02/2019. FINDINGS: MRI THORACIC SPINE FINDINGS Alignment: Examination moderately degraded by motion artifact. Mild dextroscoliosis. Alignment otherwise normal with preservation of the normal thoracic kyphosis. No listhesis. Vertebrae: Vertebral body height maintained without evidence for acute or chronic fracture. Bone marrow signal intensity within normal limits. Few scattered subcentimeter benign hemangiomata noted. No worrisome osseous lesions. No abnormal marrow edema or enhancement. Cord: No definite cord signal abnormality seen on this motion degraded exam. Cord caliber and morphology within normal limits. No appreciable abnormal enhancement. Paraspinal and other soft tissues: Paraspinous soft tissues  demonstrate no acute finding. Partially visualized lungs are clear. Exophytic T2 hyperintense simple cyst partially visualized extending from the posterior right kidney. Visualized visceral structures otherwise unremarkable. Disc levels: T1-2:  Unremarkable. T2-3: Negative interspace. Left-sided facet hypertrophy. No spinal stenosis. Mild left foraminal narrowing. T3-4: Diffuse disc bulge with superimposed left foraminal disc protrusion (series 19, image 8). Right greater than left facet hypertrophy. Resultant mild spinal stenosis without cord deformity. Mild left foraminal narrowing. T4-5: Mild disc bulge. Posterior element hypertrophy. No significant spinal stenosis. Foramina remain patent. T5-6: Tiny right paracentral disc protrusion (series 19, image 14). Posterior element hypertrophy. Mild prominence of the dorsal epidural fat. No significant spinal stenosis. Foramina remain patent. T6-7: Diffuse disc bulge. Superimposed small left paracentral  disc protrusion mildly indents the left ventral thecal sac (series 19, image 17). Posterior element hypertrophy. Prominence of the dorsal epidural fat. No significant stenosis. T7-8: Prominence of the dorsal epidural fat with mild facet hypertrophy. Otherwise negative. No stenosis. T8-9: Minimal disc bulge. Posterior element hypertrophy with prominence of the dorsal epidural fat. No stenosis. T9-10: Mild diffuse disc bulge. Moderate bilateral facet hypertrophy. Resultant mild spinal stenosis. Moderate left with mild right foraminal narrowing. T10-11: Diffuse disc bulge. Right greater than left facet hypertrophy. Resultant mild to moderate spinal stenosis (series 19, image 29). Mild left with moderate right foraminal narrowing. T11-12: Minimal disc bulge. Bilateral facet hypertrophy. No significant stenosis. T12-L1: Mild facet hypertrophy. Otherwise unremarkable. No stenosis. IMPRESSION: 1. Technically limited exam due to motion artifact. 2. Grossly normal MRI appearance of the thoracic cord with no cord signal abnormality identified. No acute abnormality or findings to explain patient's symptoms identified. 3. Multilevel degenerative disc bulging with facet hypertrophy as above. Resultant mild to moderate spinal stenosis at T9-10 and T10-11. Electronically Signed   By: Rise MuBenjamin  McClintock M.D.   On: 06/30/2019 05:23   MR Lumbar Spine W Wo Contrast  Result Date: 06/27/2019 CLINICAL DATA:  54 year old male with increasing neurologic deficits, involuntary twitches and spasms. Status post cervical spine ACDF on 01/16/2019. EXAM: MRI LUMBAR SPINE WITHOUT AND WITH CONTRAST TECHNIQUE: Multiplanar and multiecho pulse sequences of the lumbar spine were obtained without and with intravenous contrast. CONTRAST:  10mL GADAVIST GADOBUTROL 1 MMOL/ML IV SOLN in conjunction with contrast enhanced imaging of the head and cervical spine reported separately. COMPARISON:  [facility] Lumbar MRIs 04/19/2019,  12/07/2018 FINDINGS: Segmentation:  Normal. Alignment: Stable degenerative appearing retrolisthesis at L5-S1, straightening of lumbar lordosis elsewhere. There is also mild chronic retrolisthesis of L2 on L3. Vertebrae: No marrow edema or evidence of acute osseous abnormality. Visualized bone marrow signal is within normal limits. Intact visible sacrum and SI joints. Conus medullaris and cauda equina: Conus extends to the T12-L1 level. No lower spinal cord or conus signal abnormality. No abnormal intradural enhancement. No dural thickening. Paraspinal and other soft tissues: Stable and negative; benign appearing right renal midpole cyst. Disc levels: Since 04/19/2019 lower thoracic and lumbar thecal sac patency appears mildly improved above L4-L5 due to regression of epidural lipomatosis. At L2-L3 circumferential disc bulge with small posterior annular fissure and mild posterior element hypertrophy persist. Mild residual spinal stenosis at that level, improved from January. L4-L5: Persistent mild to moderate spinal stenosis in part due to epidural lipomatosis. Circumferential disc bulge, progressed right foraminal annular fissure since January (series 8 image 28). Moderate facet hypertrophy with degenerative facet joint fluid. No convincing lateral recess stenosis. Moderate right L4 foraminal stenosis. L5-S1: Continued epidural lipomatosis. Chronic retrolisthesis and disc space loss  with circumferential disc osteophyte complex and moderate posterior element hypertrophy. Stable spinal and mild lateral recess stenosis. Stable moderate to severe left and moderate right L5 foraminal stenosis. IMPRESSION: 1. Lumbar thecal sac patency has improved since the January MRI at all levels above L4-L5, due to regressed epidural lipomatosis. 2. Chronic disc degeneration at L4-L5 has progressed in the form of new right foraminal annular fissure. Moderate right L4 foraminal stenosis. Mild to moderate multifactorial spinal stenosis  is stable. 3. Advanced chronic L5-S1 degeneration is stable with mild spinal and lateral recess stenosis, and moderate to severe L5 foraminal stenosis greater on the left. Electronically Signed   By: Odessa Fleming M.D.   On: 06/27/2019 22:54    Time Spent in minutes  30   Susa Raring M.D on 06/30/2019 at 9:37 AM  To page go to [email or web address] - password [facility]

## 2019-07-01 ENCOUNTER — Telehealth: Payer: Self-pay | Admitting: Family Medicine

## 2019-07-01 LAB — CBC WITH DIFFERENTIAL/PLATELET
Abs Immature Granulocytes: 0.02 10*3/uL (ref 0.00–0.07)
Basophils Absolute: 0 10*3/uL (ref 0.0–0.1)
Basophils Relative: 0 %
Eosinophils Absolute: 0.1 10*3/uL (ref 0.0–0.5)
Eosinophils Relative: 2 %
HCT: 32.9 % — ABNORMAL LOW (ref 39.0–52.0)
Hemoglobin: 10.9 g/dL — ABNORMAL LOW (ref 13.0–17.0)
Immature Granulocytes: 0 %
Lymphocytes Relative: 25 %
Lymphs Abs: 1.2 10*3/uL (ref 0.7–4.0)
MCH: 34.8 pg — ABNORMAL HIGH (ref 26.0–34.0)
MCHC: 33.1 g/dL (ref 30.0–36.0)
MCV: 105.1 fL — ABNORMAL HIGH (ref 80.0–100.0)
Monocytes Absolute: 0.4 10*3/uL (ref 0.1–1.0)
Monocytes Relative: 7 %
Neutro Abs: 3.2 10*3/uL (ref 1.7–7.7)
Neutrophils Relative %: 66 %
Platelets: 400 10*3/uL (ref 150–400)
RBC: 3.13 MIL/uL — ABNORMAL LOW (ref 4.22–5.81)
RDW: 13.3 % (ref 11.5–15.5)
WBC: 4.9 10*3/uL (ref 4.0–10.5)
nRBC: 0 % (ref 0.0–0.2)

## 2019-07-01 LAB — COMPREHENSIVE METABOLIC PANEL
ALT: 33 U/L (ref 0–44)
AST: 43 U/L — ABNORMAL HIGH (ref 15–41)
Albumin: 2.7 g/dL — ABNORMAL LOW (ref 3.5–5.0)
Alkaline Phosphatase: 66 U/L (ref 38–126)
Anion gap: 10 (ref 5–15)
BUN: 8 mg/dL (ref 6–20)
CO2: 24 mmol/L (ref 22–32)
Calcium: 9.1 mg/dL (ref 8.9–10.3)
Chloride: 97 mmol/L — ABNORMAL LOW (ref 98–111)
Creatinine, Ser: 0.81 mg/dL (ref 0.61–1.24)
GFR calc Af Amer: >60 mL/min (ref >60)
GFR calc non Af Amer: >60 mL/min (ref >60)
Glucose, Bld: 130 mg/dL — ABNORMAL HIGH (ref 70–99)
Potassium: 4.9 mmol/L (ref 3.5–5.1)
Sodium: 131 mmol/L — ABNORMAL LOW (ref 135–145)
Total Bilirubin: 1 mg/dL (ref 0.3–1.2)
Total Protein: 7.5 g/dL (ref 6.5–8.1)

## 2019-07-01 LAB — HEAVY METALS, BLOOD
Arsenic: 5 ug/L (ref 2–23)
Lead: 3 ug/dL (ref 0–4)
Mercury: <1 ug/L (ref 0.0–14.9)

## 2019-07-01 LAB — MAGNESIUM: Magnesium: 1.7 mg/dL (ref 1.7–2.4)

## 2019-07-01 LAB — CERULOPLASMIN: Ceruloplasmin: 22.9 mg/dL (ref 16.0–31.0)

## 2019-07-01 MED ORDER — DEXTROSE 50 % IV SOLN
INTRAVENOUS | Status: AC
  Filled 2019-07-01: qty 50

## 2019-07-01 NOTE — Telephone Encounter (Signed)
Called and the patients wife updated patients status in the hospital. Patient will be going to Rehab after being in the hospital. The patients wife wanted to make sure PCP is aware of all that is going on with him. The wife needs to drop off FMLA paperwork for her.Marland KitchenMarland Kitchen

## 2019-07-01 NOTE — Progress Notes (Signed)
Physical Therapy Treatment Patient Details Name: Jorge Weaver. MRN: [id number] DOB: Jan 06, 1966 Today's Date: 07/01/2019    History of Present Illness Pt is a 54 y/o male who presents to the ER with increasing tightness of the extremities, difficulty moving/walking and increased numbness of the upper and lower extremities. MRI of the brain and C-spine were negative for acute changes. Neurosurgery consulted with no surgical/medical intervention recommended. Pt found to have severe hypokalemia and was admitted. PMHx includes anemia, HTN, PNA, hx of ACDF 12/2018    PT Comments    Pt was initially reluctant to work on standing activity.  Continues to be limited more by diminished sensation and ataxia.  Emphasis on transitions, balance challenge, standing/pre-gait activity and transfers.    Follow Up Recommendations  CIR     Equipment Recommendations  None recommended by PT    Recommendations for Other Services Rehab consult     Precautions / Restrictions Precautions Precautions: Fall Precaution Comments: ataxia    Mobility  Bed Mobility Overal bed mobility: Needs Assistance Bed Mobility: Rolling;Sidelying to Sit Rolling: Min assist Sidelying to sit: Min assist       General bed mobility comments: uncoordinated roll and press up from R elbow.  Mostly stability assist and some coordination assist.  pt could scoot forward asymmetrically without assist but with some incoordition  Transfers Overall transfer level: Needs assistance Equipment used: Rolling walker (2 wheeled) Transfers: Sit to/from [employer] Sit to Stand: Min assist;+2 safety/equipment(x4) Stand pivot transfers: Min assist;+2 physical assistance;+2 safety/equipment          Ambulation/Gait             General Gait Details: pivotal steps only   Social research officer, government Rank[employer] (Stroke Patients Only)       Balance Overall balance assessment:  Needs assistance Sitting-balance support: Feet supported;No upper extremity supported;Single extremity supported;Bilateral upper extremity supported Sitting balance-Leahy Scale: Fair Sitting balance - Comments: can accept minimal challenge to balance with some truncal ataxia with reaction.   Standing balance support: Bilateral upper extremity supported;During functional activity Standing balance-Leahy Scale: Poor Standing balance comment: Worked on pregait and balance with w/shifts, holding midline, steppin and locking/unlocking knees with w/shifting/mini squats.                            Cognition Arousal/Alertness: Awake/alert Behavior During Therapy: WFL for tasks assessed/performed Overall Cognitive Status: Within Functional Limits for tasks assessed                                        Exercises      General Comments General comments (skin integrity, edema, etc.): mild rise in HR into the 110's      Pertinent Vitals/Pain Pain Assessment: No/denies pain    Home Living                      Prior Function            PT Goals (current goals can now be found in the care plan section) Acute Rehab PT Goals Patient Stated Goal: fish, back to work  PT Goal Formulation: With patient Time For Goal Achievement: 07/13/19 Potential to Achieve Goals: Good Progress towards PT goals: Progressing toward goals  Frequency    Min 3X/week      PT Plan Current plan remains appropriate    Co-evaluation              AM-PAC PT "6 Clicks" Mobility   Outcome Measure  Help needed turning from your back to your side while in a flat bed without using bedrails?: A Little Help needed moving from lying on your back to sitting on the side of a flat bed without using bedrails?: A Little Help needed moving to and from a bed to a chair (including a wheelchair)?: A Little Help needed standing up from a chair using your arms (e.g., wheelchair or  bedside chair)?: A Little Help needed to walk in hospital room?: A Lot Help needed climbing 3-5 steps with a railing? : A Lot 6 Click Score: 16    End of Session   Activity Tolerance: Patient tolerated treatment well Patient left: in chair;with call bell/phone within reach;with chair alarm set Nurse Communication: Mobility status PT Visit Diagnosis: Unsteadiness on feet (R26.81);Ataxic gait (R26.0);History of falling (Z91.81);Other symptoms and signs involving the nervous system (R29.898)     Time: 9379-0240 PT Time Calculation (min) (ACUTE ONLY): 33 min  Charges:  $Therapeutic Activity: 23-37 mins                     07/01/2019  Ginger Carne., PT Acute Rehabilitation Services [phone]  (pager) [phone]  (office)   Tessie Fass Shirly Bartosiewicz 07/01/2019, 5:45 PM

## 2019-07-01 NOTE — Progress Notes (Signed)
Occupational Therapy Treatment Patient Details Name: Jorge Weaver. MRN: [id number] DOB: 05-22-1965 Today's Date: 07/01/2019    History of present illness Pt is a 54 y/o male who presents to the ER with increasing tightness of the extremities, difficulty moving/walking and increased numbness of the upper and lower extremities. MRI of the brain and C-spine were negative for acute changes. Neurosurgery consulted with no surgical/medical intervention recommended. Pt found to have severe hypokalemia and was admitted. PMHx includes anemia, HTN, PNA, hx of ACDF 12/2018   OT comments  Pt making progress in therapy, reporting improved function of BUEs. Session focused on BUE strengthening and coordination while seated in bedside chair. Pt able to complete 2 x 10 shld flex, 2 x 10 forward rows, 2 x 10 backward rows, and 2 x 10 elbow flex/ext with use of towel. Pt required min cues on technique, demonstrating fair slow and controlled movement. Rest breaks provided throughout with pt reporting mod fatigue following. Educated/instructed pt on seated chair situps to increase core strength required for ADLs and transfers. Pt able to complete 2 x 10 requiring min cues throughout. Pt utilized BUEs during exercise, unable to complete with core alone. All questions/concerns answered at this time. OT will continue to follow acutely.    Follow Up Recommendations  CIR;Supervision/Assistance - 24 hour    Equipment Recommendations  3 in 1 bedside commode;Other (comment)(TBD at next venue of care)    Recommendations for Other Services      Precautions / Restrictions Precautions Precautions: Fall Precaution Comments: ataxia Restrictions Weight Bearing Restrictions: No       Mobility Bed Mobility               General bed mobility comments: Pt seated in bedside chair upon OT arrival  Transfers                      Balance                                           ADL  either performed or assessed with clinical judgement   ADL                                               Vision       Perception     Praxis      Cognition Arousal/Alertness: Awake/alert Behavior During Therapy: [facility] for tasks assessed/performed Overall Cognitive Status: Within Functional Limits for tasks assessed                                 General Comments: Pt pleasant and willing to participate in therapy tasks. Pt extremely motivated.         Exercises Exercises: General Upper Extremity;Other exercises General Exercises - Upper Extremity Shoulder Flexion: AROM;Strengthening;Both;10 reps;Seated(2 sets. Use of towel) Elbow Flexion: AROM;Strengthening;Both;10 reps;Seated(2 sets. Use of towel) Elbow Extension: AROM;Strengthening;Both;10 reps;Seated(2 sets. Use of towel) Other Exercises Other Exercises: 2 x 10 forwards and backwards rows with towel while seated.  Other Exercises: Seated chair situps 2 x 10 with use of arms for assist. Pt unable to complete with core alone.    Shoulder Instructions  General Comments      Pertinent Vitals/ Pain       Pain Assessment: No/denies pain  Home Living   Living Arrangements: Spouse/significant other Available Help at Discharge: Family Type of Home: House Home Access: Stairs to enter [employer] of Steps: slight step to get onto porch Entrance Stairs-Rails: None Home Layout: (12 steps inside house with no rails)     Bathroom Shower/Tub: Teacher, early years/pre: Standard Bathroom Accessibility: Yes How Accessible: Accessible via walker     Additional Comments: Per wife, pt tries not to use walker or cane  Lives With: Spouse    Prior Functioning/Environment Level of Independence: Independent            Frequency           Progress Toward Goals  OT Goals(current goals can now be found in the care plan section)  Progress towards OT goals:  Progressing toward goals  ADL Goals Pt Will Perform Grooming: with modified independence;standing Pt Will Perform Lower Body Bathing: with modified independence;sit to/from stand Pt Will Perform Upper Body Dressing: with modified independence;sitting Pt Will Perform Lower Body Dressing: with modified independence;sit to/from stand Pt Will Transfer to Toilet: with modified independence;ambulating Pt Will Perform Toileting - Clothing Manipulation and hygiene: with modified independence;sit to/from stand Pt/caregiver will Perform Home Exercise Program: Both right and left upper extremity;With written HEP provided;Independently  Plan Discharge plan remains appropriate    Co-evaluation                 AM-PAC OT "6 Clicks" Daily Activity     Outcome Measure   Help from another person eating meals?: A Little Help from another person taking care of personal grooming?: A Little Help from another person toileting, which includes using toliet, bedpan, or urinal?: A Lot Help from another person bathing (including washing, rinsing, drying)?: A Lot Help from another person to put on and taking off regular upper body clothing?: A Little Help from another person to put on and taking off regular lower body clothing?: A Lot 6 Click Score: 15    End of Session    OT Visit Diagnosis: Other abnormalities of gait and mobility (R26.89);Ataxia, unspecified (R27.0);History of falling (Z91.81)   Activity Tolerance Patient tolerated treatment well   Patient Left in chair;with call bell/phone within reach;with chair alarm set   Nurse Communication Mobility status        Time: 1027-2536 OT Time Calculation (min): 31 min  Charges: OT General Charges $OT Visit: 1 Visit OT Treatments $Therapeutic Exercise: 23-37 mins  Mauri Brooklyn OTR/L [phone]   Mauri Brooklyn 07/01/2019, 1:27 PM

## 2019-07-01 NOTE — Progress Notes (Signed)
PROGRESS NOTE                                                                                                                                                                                                             Patient Demographics:    Jorge Weaver, is a 54 y.o. male, DOB - 23-Aug-1965, OJJ:[id number]  Admit date - 06/27/2019   Admitting Physician Eduard Clos, MD  Outpatient Primary MD for the patient is [name], DO  LOS - 3  Chief Complaint  Patient presents with  . Weakness       Brief Narrative  - 54 y.o. male with history of hypertension previous alcohol abuse which patient states has stopped drinking for almost a month now who has had a surgery in October 2024 cervical 6 corpectomy and C5 7 arthrodesis by Dr. Franky Macho presents to the ER because of increasing tightness of the extremities difficulty to move difficulty walking and increasing numbness of the upper and lower extremities.  Denies any nausea vomiting diarrhea headache chest pain.  Has been having recurrent falls.  In the ER his MRI brain and C-spine were nonacute, this was discussed by the admitting physician with neurologist on-call at night Dr. Wilford Corner, in the morning I discussed his MRI findings with neurosurgeon Dr. Maisie Fus.  No surgical intervention or medical intervention needed according to the specialist.  Incidentally he was found to have life-threatening low severe hypokalemia along with ongoing weakness and admitted to the hospital.   Subjective:   Patient in bed denies any headache, no chest or abdominal pain, his weakness is improving including on the left side of his face.   Assessment  & Plan :     1.  Generalized subacute weakness worse in the left upper extremity and left side of the face ongoing for several weeks in a patient with history of C-spine fusion surgery.  Case discussed by admitting physician with neurologist Dr. Wilford Corner and me with  neurosurgeon Dr. Maisie Fus.   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  Continue PT OT, may require rehab.  Overall weakness is improving but still long ways to go.   2.  Multiple falls at home  with lip and tongue injury during a fall along with avulsed right big toe nail.  Due to muscular weakness from #1 above, supportive care and monitor.  3. HTN - on Norvasc, Lopressor blood pressure still running high have increased hydralazine dose for better control.  4.  History of alcohol abuse.  Counseled to quit.  Placed on folic acid and thiamine.  Last drink 1 month ago.  5.  Severe hypokalemia.  Replaced and stable.    Family Communication  :  Wife [name] - [phone], 06/28/19, 06/30/19, 07/01/19  Code Status : Full  Disposition Plan  :  Stay in the hospital for SEVERE Hypokalemia with periodic paralysis  Consults  :  Neurology Dr Wilford Corner and N. Surgery Dr Maisie Fus over the phone - 06/28/19  Procedures  :    LP 06/30/19  MRI Brain - C Spine -   1. 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 2. Status post ACDF at C5-C6 and C6-C7. Resolved spinal stenosis at C6-C7, and regressed although not resolved abnormal spinal cord signal and enhancement centered at that level. 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 3. Mild cervical spinal stenosis elsewhere, including some residual cord mass effect related to C5 endplate spurring, but no other abnormal cord signal is identified. 4. Degenerative neural foraminal stenosis, up to moderate at the left C5 and C8 nerve levels, and severe at the left C6 nerve level.   No acute  intracranial abnormality and largely unremarkable for age MRI appearance of the brain.   DVT Prophylaxis  :   Heparin    Lab Results  Component Value Date   PLT 400 07/01/2019    Diet :  Diet Order            Diet Heart Room service appropriate? Yes; Fluid consistency: Thin  Diet effective now               Inpatient Medications  Scheduled Meds: . amLODipine  10 mg Oral Daily  . folic acid  1 mg Oral Daily  . gabapentin  300 mg Oral TID  . heparin injection (subcutaneous)  5,000 Units Subcutaneous Q8H  . hydrALAZINE  100 mg Oral Q8H  . metoprolol tartrate  100 mg Oral BID   Continuous Infusions: . Immune Globulin 10% Stopped (06/30/19 2355)  . thiamine injection 500 mg (07/01/19 0222)   PRN Meds:.acetaminophen, acetaminophen, diphenhydrAMINE, hydrALAZINE, lip balm, [DISCONTINUED] ondansetron **OR** ondansetron (ZOFRAN) IV, zolpidem  Antibiotics  :   Anti-infectives (From admission, onward)   None        Objective:   Vitals:   06/30/19 2246 06/30/19 2300 07/01/19 0011 07/01/19 0821  BP: (!) 162/109 (!) 159/115 (!) 158/113 (!) 143/111  Pulse: 87 87 83 96  Resp:    19  Temp: 98.7 F (37.1 C) 99.3 F (37.4 C) 98.3 F (36.8 C) 97.8 F (36.6 C)  TempSrc: Oral Oral Oral   SpO2: 100% 99% 100% 99%  Weight:      Height:        SpO2: 99 %  Wt Readings from Last 3 Encounters:  06/28/19 112.4 kg  06/16/19 122.5 kg  06/09/19 122.2 kg     Intake/Output Summary (Last 24 hours) at 07/01/2019 1022 Last data filed at 07/01/2019 0300 Gross per 24 hour  Intake 650 ml  Output --  Net 650 ml     Physical Exam  Awake Alert, No new F.N deficits, generalized weakness in all 4 extremities with  strength 5/5 but somewhat weaker in the left arm as compared to other limbs but this is chronic according to patient, mild left-sided facial droop which is also been present for a few weeks according to the wife [place].AT,PERRAL Supple Neck,No JVD, No cervical lymphadenopathy  appriciated.  Symmetrical Chest wall movement, Good air movement bilaterally, CTAB RRR,No Gallops, Rubs or new Murmurs, No Parasternal Heave +ve B.Sounds, Abd Soft, No tenderness, No organomegaly appriciated, No rebound - guarding or rigidity. No Cyanosis, Clubbing or edema, No new Rash or bruise    Data Review:    Recent Labs  Lab 06/27/19 1735 06/28/19 0551 06/29/19 0533 06/30/19 1022 07/01/19 0227  WBC 3.8* 4.8 7.3 7.0 4.9  HGB 12.1* 10.9* 12.0* 11.7* 10.9*  HCT 36.9* 32.5* 35.9* 35.6* 32.9*  PLT 374 363 372 408* 400  MCV 107.3* 104.2* 105.0* 106.6* 105.1*  MCH 35.2* 34.9* 35.1* 35.0* 34.8*  MCHC 32.8 33.5 33.4 32.9 33.1  RDW 13.8 13.4 13.4 13.5 13.3  LYMPHSABS  --  1.2 1.8 1.6 1.2  MONOABS  --  0.6 0.6 0.4 0.4  EOSABS  --  0.0 0.1 0.1 0.1  BASOSABS  --  0.0 0.0 0.0 0.0    Recent Labs  Lab 06/27/19 1735 06/27/19 1735 06/27/19 1846 06/27/19 2346 06/28/19 0551 06/28/19 1429 06/29/19 0533 06/30/19 1022 07/01/19 0227  NA 136  --   --   --  137  --  136 135 131*  K 2.2*   < >  --   --  2.5* 3.5 3.3* 3.9 4.9  CL 89*  --   --   --  92*  --  96* 99 97*  CO2 33*  --   --   --  35*  --  GLUCOSE 122*  --   --   --  119*  --  110* 138* 130*  BUN 6  --   --   --  6  --  <5* <5* 8  CREATININE 0.82  --   --   --  0.79  --  0.77 0.77 0.81  CALCIUM 8.7*  --   --   --  8.5*  --  9.1 8.9 9.1  AST  --   --   --   --  51*  --  54* 46* 43*  ALT  --   --   --   --  35  --  38 37 33  ALKPHOS  --   --   --   --  75  --  78 75 66  BILITOT  --   --   --   --  1.2  --  0.4 0.7 1.0  ALBUMIN  --   --   --   --  3.0*  --  3.1* 3.0* 2.7*  MG  --   --  1.7  --   --   --  1.8 1.8 1.7  INR  --   --   --   --   --   --   --  0.8  --   TSH  --   --   --  1.777 1.501  --   --   --   --   BNP  --   --   --   --   --   --  48.2  --   --    < > = values in this interval not displayed.  Recent Labs  Lab 06/28/19 0042 06/29/19 0533  BNP  --  48.2  SARSCOV2NAA NEGATIVE  --       ------------------------------------------------------------------------------------------------------------------ No results for input(s): CHOL, HDL, LDLCALC, TRIG, CHOLHDL, LDLDIRECT in the last 72 hours.  Lab Results  Component Value Date   HGBA1C 6.1 08/07/2017   ------------------------------------------------------------------------------------------------------------------ No results for input(s): TSH, T4TOTAL, T3FREE, THYROIDAB in the last 72 hours.  Invalid input(s): FREET3 ------------------------------------------------------------------------------------------------------------------ No results for input(s): VITAMINB12, FOLATE, FERRITIN, TIBC, IRON, RETICCTPCT in the last 72 hours.  Coagulation profile Recent Labs  Lab 06/30/19 1022  INR 0.8    No results for input(s): DDIMER in the last 72 hours.  Cardiac Enzymes No results for input(s): CKMB, TROPONINI, MYOGLOBIN in the last 168 hours.  Invalid input(s): CK ------------------------------------------------------------------------------------------------------------------    Component Value Date/Time   BNP 48.2 06/29/2019 0533    Micro Results Recent Results (from the past 240 hour(s))  SARS CORONAVIRUS 2 (TAT 6-24 HRS) Nasopharyngeal Nasopharyngeal Swab     Status: None   Collection Time: 06/28/19 12:42 AM   Specimen: Nasopharyngeal Swab  Result Value Ref Range Status   SARS Coronavirus 2 NEGATIVE NEGATIVE Final    Comment: (NOTE) SARS-CoV-2 target nucleic acids are NOT DETECTED. The SARS-CoV-2 RNA is generally detectable in upper and lower respiratory specimens during the acute phase of infection. Negative results do not preclude SARS-CoV-2 infection, do not rule out co-infections with other pathogens, and should not be used as the sole basis for treatment or other patient management decisions. Negative results must be combined with clinical observations, patient history, and epidemiological  information. The expected result is Negative. Fact Sheet for Patients: HairSlick.no Fact Sheet for Healthcare Providers: quierodirigir.com This test is not yet approved or cleared by the [place] FDA and  has been authorized for detection and/or diagnosis of SARS-CoV-2 by FDA under an Emergency Use Authorization (EUA). This EUA will remain  in effect (meaning this test can be used) for the duration of the COVID-19 declaration under Section 56 4(b)(1) of the Act, 21 U.S.C. section 360bbb-3(b)(1), unless the authorization is terminated or revoked sooner. Performed at [facility], [address], [place], [place] [address]     Radiology Reports MR Brain W and Wo Contrast  Result Date: 06/27/2019 CLINICAL DATA:  54 year old male with increasing neurologic deficits, involuntary twitches and spasms. Status post cervical spine ACDF on 01/16/2019. EXAM: MRI HEAD WITHOUT AND WITH CONTRAST TECHNIQUE: Multiplanar, multiecho pulse sequences of the brain and surrounding structures were obtained without and with intravenous contrast. CONTRAST:  61mL GADAVIST GADOBUTROL 1 MMOL/ML IV SOLN COMPARISON:  No prior head MRI.  Head CT 10/15/2013. FINDINGS: Brain: Cerebral volume is similar to that in 2015. No restricted diffusion to suggest acute infarction. No midline shift, mass effect, evidence of mass lesion, ventriculomegaly, extra-axial collection or acute intracranial hemorrhage. Cervicomedullary junction and pituitary are within normal limits. Largely normal for age gray and white matter signal, minimal scattered nonspecific cerebral white matter T2 and FLAIR hyperintensity. No cortical encephalomalacia or chronic cerebral blood products. No abnormal enhancement identified. No dural thickening. Vascular: Major intracranial vascular flow voids are preserved. The major dural venous sinuses are enhancing and appear to be patent. Skull and upper  cervical spine: Cervical spine details are reported separately. Visualized bone marrow signal is within normal limits. Sinuses/Orbits: Negative orbits. Paranasal sinuses and mastoids are stable and well pneumatized. Other: Scalp and face soft tissues appear negative. There is a tiny right nasopharyngeal retention cyst, inconsequential. IMPRESSION:  No acute intracranial abnormality and largely unremarkable for age MRI appearance of the brain. Electronically Signed   By: Genevie Ann M.D.   On: 06/27/2019 22:32   MR Cervical Spine W or Wo Contrast  Result Date: 06/27/2019 CLINICAL DATA:  54 year old male with increasing neurologic deficits, involuntary twitches and spasms. Status post cervical spine ACDF on 01/16/2019. EXAM: MRI CERVICAL SPINE WITHOUT AND WITH CONTRAST TECHNIQUE: Multiplanar and multiecho pulse sequences of the cervical spine, to include the craniocervical junction and cervicothoracic junction, were obtained without and with intravenous contrast. CONTRAST:  51mL GADAVIST GADOBUTROL 1 MMOL/ML IV SOLN COMPARISON:  Preoperative cervical spine MRI with contrast on 01/16/2019. And outside preoperative cervical spine without contrast 01/02/2019, now available on [facility]. FINDINGS: Alignment: Straightening of cervical lordosis, less reversal compared to October. There may be mild residual retrolisthesis of C5 on C6. Vertebrae: Mild hardware susceptibility artifact from C5-C6 and C6-C7 ACDF, new from prior MRI. No marrow edema or evidence of acute osseous abnormality. Background bone marrow signal within normal limits. Cord: The spinal cord remains abnormal at C6-C7, although the extent of abnormal cord T2 signal is regressed compared to 01/02/2019, where the T2 abnormality continued to the C7-T1 level. Patchy abnormal T2 signal is now confined to the C6-C7 level, and on axial images appears to spare only the most peripheral cord white matter (series 9, image 33). The cord also appears less expanded. And  following contrast there is only a faint 5 mm focus of residual enhancement (9 mm and more intensely enhancing previously). Above and below that level cord signal appears to remain normal. No other abnormal intradural enhancement. No dural thickening. Posterior Fossa, vertebral arteries, paraspinal tissues: Cervicomedullary junction is within normal limits. Preserved major vascular flow voids in the neck. Negative visible neck soft tissues. Disc levels: There is a congenital degree of cervical spinal canal narrowing related to short pedicles, with superimposed degenerative changes: C2-C3: Mild disc bulge and endplate spurring plus ligament flavum hypertrophy. Mild spinal stenosis but no cord mass effect (series 10, image 6). C3-C4: Mild disc bulge and ligament flavum hypertrophy. Mild spinal stenosis but no cord mass effect. C4-C5: Circumferential disc bulge and mild endplate spurring. Less ligament flavum hypertrophy here. Mild facet hypertrophy. Mild spinal stenosis. No cord mass effect. Mild to moderate C5 foraminal stenosis greater on the left. C5-C6: Interval ACDF. But there does appear to be residual spinal stenosis with mild cord mass effect at the C5 inferior endplate level (series 11, image 27). No cord signal abnormality. Endplate spurring results in severe left and mild right C6 foraminal stenosis (same image). C6-C7: Interval ACDF. Ligament flavum hypertrophy but no significant spinal stenosis. Foraminal patency also appears improved, mild residual stenosis on the right. C7-T1: Mostly foraminal disc bulge and endplate spurring. No spinal stenosis. Moderate left and mild right C8 foraminal stenosis. No upper thoracic spinal stenosis. IMPRESSION: 1. 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 2. Status post ACDF at C5-C6 and C6-C7. Resolved spinal stenosis at C6-C7, and regressed although not resolved abnormal spinal cord signal and enhancement centered at that level. Suspect the  residual cord abnormality reflects myelomalacia, prior compressive myelopathy. 3. Mild cervical spinal stenosis elsewhere, including some residual cord mass effect related to C5 endplate spurring, but no other abnormal cord signal is identified. 4. Degenerative neural foraminal stenosis, up to moderate at the left C5 and C8 nerve levels, and severe at the left C6 nerve level. Preliminary results of this exam were discussed by telephone with Dr.  Italy Sheldon in the ED. Electronically Signed   By: Odessa Fleming M.D.   On: 06/27/2019 22:47   MR THORACIC SPINE W WO CONTRAST  Result Date: 06/30/2019 CLINICAL DATA:  A shin initial evaluation for 1 month history of progressive weakness. EXAM: MRI THORACIC WITHOUT AND WITH CONTRAST TECHNIQUE: Multiplanar and multiecho pulse sequences of the thoracic spine were obtained without and with intravenous contrast. CONTRAST:  4mL GADAVIST GADOBUTROL 1 MMOL/ML IV SOLN COMPARISON:  Prior MRI from 01/02/2019. FINDINGS: MRI THORACIC SPINE FINDINGS Alignment: Examination moderately degraded by motion artifact. Mild dextroscoliosis. Alignment otherwise normal with preservation of the normal thoracic kyphosis. No listhesis. Vertebrae: Vertebral body height maintained without evidence for acute or chronic fracture. Bone marrow signal intensity within normal limits. Few scattered subcentimeter benign hemangiomata noted. No worrisome osseous lesions. No abnormal marrow edema or enhancement. Cord: No definite cord signal abnormality seen on this motion degraded exam. Cord caliber and morphology within normal limits. No appreciable abnormal enhancement. Paraspinal and other soft tissues: Paraspinous soft tissues demonstrate no acute finding. Partially visualized lungs are clear. Exophytic T2 hyperintense simple cyst partially visualized extending from the posterior right kidney. Visualized visceral structures otherwise unremarkable. Disc levels: T1-2:  Unremarkable. T2-3: Negative interspace.  Left-sided facet hypertrophy. No spinal stenosis. Mild left foraminal narrowing. T3-4: Diffuse disc bulge with superimposed left foraminal disc protrusion (series 19, image 8). Right greater than left facet hypertrophy. Resultant mild spinal stenosis without cord deformity. Mild left foraminal narrowing. T4-5: Mild disc bulge. Posterior element hypertrophy. No significant spinal stenosis. Foramina remain patent. T5-6: Tiny right paracentral disc protrusion (series 19, image 14). Posterior element hypertrophy. Mild prominence of the dorsal epidural fat. No significant spinal stenosis. Foramina remain patent. T6-7: Diffuse disc bulge. Superimposed small left paracentral disc protrusion mildly indents the left ventral thecal sac (series 19, image 17). Posterior element hypertrophy. Prominence of the dorsal epidural fat. No significant stenosis. T7-8: Prominence of the dorsal epidural fat with mild facet hypertrophy. Otherwise negative. No stenosis. T8-9: Minimal disc bulge. Posterior element hypertrophy with prominence of the dorsal epidural fat. No stenosis. T9-10: Mild diffuse disc bulge. Moderate bilateral facet hypertrophy. Resultant mild spinal stenosis. Moderate left with mild right foraminal narrowing. T10-11: Diffuse disc bulge. Right greater than left facet hypertrophy. Resultant mild to moderate spinal stenosis (series 19, image 29). Mild left with moderate right foraminal narrowing. T11-12: Minimal disc bulge. Bilateral facet hypertrophy. No significant stenosis. T12-L1: Mild facet hypertrophy. Otherwise unremarkable. No stenosis. IMPRESSION: 1. Technically limited exam due to motion artifact. 2. Grossly normal MRI appearance of the thoracic cord with no cord signal abnormality identified. No acute abnormality or findings to explain patient's symptoms identified. 3. Multilevel degenerative disc bulging with facet hypertrophy as above. Resultant mild to moderate spinal stenosis at T9-10 and T10-11.  Electronically Signed   By: Rise Mu M.D.   On: 06/30/2019 05:23   MR Lumbar Spine W Wo Contrast  Result Date: 06/27/2019 CLINICAL DATA:  54 year old male with increasing neurologic deficits, involuntary twitches and spasms. Status post cervical spine ACDF on 01/16/2019. EXAM: MRI LUMBAR SPINE WITHOUT AND WITH CONTRAST TECHNIQUE: Multiplanar and multiecho pulse sequences of the lumbar spine were obtained without and with intravenous contrast. CONTRAST:  56mL GADAVIST GADOBUTROL 1 MMOL/ML IV SOLN in conjunction with contrast enhanced imaging of the head and cervical spine reported separately. COMPARISON:  [facility] Lumbar MRIs 04/19/2019, 12/07/2018 FINDINGS: Segmentation:  Normal. Alignment: Stable degenerative appearing retrolisthesis at L5-S1, straightening of lumbar lordosis elsewhere. There  is also mild chronic retrolisthesis of L2 on L3. Vertebrae: No marrow edema or evidence of acute osseous abnormality. Visualized bone marrow signal is within normal limits. Intact visible sacrum and SI joints. Conus medullaris and cauda equina: Conus extends to the T12-L1 level. No lower spinal cord or conus signal abnormality. No abnormal intradural enhancement. No dural thickening. Paraspinal and other soft tissues: Stable and negative; benign appearing right renal midpole cyst. Disc levels: Since 04/19/2019 lower thoracic and lumbar thecal sac patency appears mildly improved above L4-L5 due to regression of epidural lipomatosis. At L2-L3 circumferential disc bulge with small posterior annular fissure and mild posterior element hypertrophy persist. Mild residual spinal stenosis at that level, improved from January. L4-L5: Persistent mild to moderate spinal stenosis in part due to epidural lipomatosis. Circumferential disc bulge, progressed right foraminal annular fissure since January (series 8 image 28). Moderate facet hypertrophy with degenerative facet joint fluid. No convincing  lateral recess stenosis. Moderate right L4 foraminal stenosis. L5-S1: Continued epidural lipomatosis. Chronic retrolisthesis and disc space loss with circumferential disc osteophyte complex and moderate posterior element hypertrophy. Stable spinal and mild lateral recess stenosis. Stable moderate to severe left and moderate right L5 foraminal stenosis. IMPRESSION: 1. Lumbar thecal sac patency has improved since the January MRI at all levels above L4-L5, due to regressed epidural lipomatosis. 2. Chronic disc degeneration at L4-L5 has progressed in the form of new right foraminal annular fissure. Moderate right L4 foraminal stenosis. Mild to moderate multifactorial spinal stenosis is stable. 3. Advanced chronic L5-S1 degeneration is stable with mild spinal and lateral recess stenosis, and moderate to severe L5 foraminal stenosis greater on the left. Electronically Signed   By: Odessa FlemingH  Hall M.D.   On: 06/27/2019 22:54    Time Spent in minutes  30   Susa RaringPrashant Jaquelinne Glendening M.D on 07/01/2019 at 10:22 AM  To page go to [email or web address] - password [email or web address]

## 2019-07-01 NOTE — Telephone Encounter (Signed)
PT's wife is calling to give updated information on Jorge Weaver's Inpatient  Status. She also wants to speak on his rehab location as well. Please  Back  [phone]. [name]

## 2019-07-01 NOTE — Progress Notes (Signed)
Spoke with pt at bedside regarding potential inpatient rehab admission being on hold until administration of IVIG complete (5 days).  Pt acknowledged understanding. Also called pt's wife, [name], and explained progress of potential CIR admission.  She also acknowledged understanding.  Will continue to monitor pt's progress.  Wolfgang [place], MS, CCC-SLP Admissions Coordinator [phone]

## 2019-07-01 NOTE — Progress Notes (Signed)
NEUROLOGY PROGRESS NOTE   Subjective: Patient has no complaints at this time.  He states that he feels as though he is slightly better.  Exam: Vitals:   07/01/19 0011 07/01/19 0821  BP: (!) 158/113 (!) 143/111  Pulse: 83 96  Resp:  19  Temp: 98.3 F (36.8 C) 97.8 F (36.6 C)  SpO2: 100% 99%    ROS General ROS: negative for - chills, fatigue, fever, night sweats, weight gain or weight loss Psychological ROS: negative for - behavioral disorder, hallucinations, memory difficulties, mood swings or suicidal ideation Ophthalmic ROS: negative for - blurry vision, double vision, eye pain or loss of vision Respiratory ROS: negative for - cough, hemoptysis, shortness of breath or wheezing Cardiovascular ROS: negative for - chest pain, dyspnea on exertion, edema or irregular heartbeat Gastrointestinal ROS: negative for - abdominal pain, diarrhea, hematemesis, nausea/vomiting or stool incontinence Genito-Urinary ROS: negative for - dysuria, hematuria, incontinence or urinary frequency/urgency Musculoskeletal ROS: Positive for -  muscular weakness Neurological ROS: as noted in HPI Dermatological ROS: negative for rash and skin lesion changes   Physical Exam  Constitutional: Appears well-developed and well-nourished.  Psych: Affect appropriate to situation Eyes: No scleral injection HENT: No OP obstrucion Head: Normocephalic.  Cardiovascular: Normal rate and regular rhythm.  Respiratory: Effort normal, non-labored breathing GI: Soft.  No distension. There is no tenderness.  Skin: WDI Neuro:  Mental Status: Alert, oriented, thought content appropriate.  Speech slightly dysarthric without evidence of aphasia.  Able to follow 3 step commands without difficulty. Cranial Nerves: II:  Visual fields grossly normal,  III,IV, VI: ptosis present right eye, extra-ocular motions intact bilaterally pupils equal, round, reactive to light and accommodation V,VII: smile symmetric, facial light touch  sensation normal bilaterally VIII: hearing normal bilaterally Motor: Right : Upper extremity   5/5    Left:     Upper extremity   5/5  Lower extremity   5/5     Lower extremity   5/5 Sensory:  -States he has no vibratory sensation throughout -Positive decreased light touch, pinprick, no proprioception, decreased temperature in bilateral lower extremities up to mid calf. -Positive decreased light touch, pinprick, temperature up to mid forearm bilaterally however intact proprioception. Deep Tendon Reflexes: Absent   Medications:  Scheduled: . amLODipine  10 mg Oral Daily  . folic acid  1 mg Oral Daily  . gabapentin  300 mg Oral TID  . heparin injection (subcutaneous)  5,000 Units Subcutaneous Q8H  . hydrALAZINE  100 mg Oral Q8H  . metoprolol tartrate  100 mg Oral BID   Continuous: . Immune Globulin 10% Stopped (06/30/19 2355)  . thiamine injection 500 mg (07/01/19 0222)   ZOX:[id number], acetaminophen, diphenhydrAMINE, hydrALAZINE, lip balm, [DISCONTINUED] ondansetron **OR** ondansetron (ZOFRAN) IV, zolpidem  Pertinent Labs/Diagnostics: Results for ASHLIN, HIDALGO (MRN [id number]) as of 07/01/2019 10:09  Ref. Range 06/30/2019 14:58  Appearance, CSF Latest Ref Range: CLEAR  CLEAR  Glucose, CSF Latest Ref Range: 40 - 70 mg/dL 82 (H)  RBC Count, CSF Latest Ref Range: 0 /cu mm 0  WBC, CSF Latest Ref Range: 0 - 5 /cu mm 1  Other Cells, CSF Unknown TOO FEW TO COUNT, SMEAR AVAILABLE FOR REVIEW  Color, CSF Latest Ref Range: COLORLESS  COLORLESS  Supernatant Unknown NOT INDICATED  Total  Protein, CSF Latest Ref Range: 15 - 45 mg/dL 102 (H)  Tube # Unknown 3    MR THORACIC SPINE W WO CONTRAST  Result Date: 06/30/2019 CLINICAL DATA:  A shin initial evaluation  for 1 month history of progressive weakness. EXAM: MRI THORACIC WITHOUT AND WITH CONTRAST TECHNIQUE:  IMPRESSION: 1. Technically limited exam due to motion artifact. 2. Grossly normal MRI appearance of the thoracic cord with no  cord signal abnormality identified. No acute abnormality or findings to explain patient's symptoms identified. 3. Multilevel degenerative disc bulging with facet hypertrophy as above. Resultant mild to moderate spinal stenosis at T9-10 and T10-11. Electronically Signed   By: Rise Mu M.D.   On: 06/30/2019 05:23     Felicie Morn PA-C [facility] [phone]  Assessment: 54 year old male with acute sensory ataxia/sensation loss over the past 4 weeks.  He has a history of myelopathy, and was documented having brisk reflexes in his last neurologist visit, but currently he is areflexic.  Images show no evidence of recurrent myelopathy.  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   Recommendations: -PT/OT -IVIG x5 days -Heavy metal screen pending -Vitamin E pending -Ceruloplasmin pending    07/01/2019, 10:04 AM

## 2019-07-01 NOTE — PMR Pre-admission (Cosign Needed Addendum)
PMR Admission Coordinator Pre-Admission Assessment  Patient: Jorge Weaver. is an 54 y.o., male MRN: [id number] DOB: May 25, 1965 Height: 5' 11"  (180.3 cm) Weight: 112.4 kg              Insurance Information HMO:     PPO: yes     PCP:      IPA:      80/20:      OTHER:  PRIMARY: [employer] [place]      Policy#: [id number]      Subscriber: patient CM Name: Faxed approval from [facility]      Phone#: [phone]     Fax#: [phone] Pre-Cert#: [id number]      Employer:  [employer]aufmann provided by Amy via fax for admit to CIR. Effective dates 07/04/19 with clinical update required by 07/14/19. Follow up CM: Amy (p): [phone]; (f): [phone] Benefits:  Phone #: [phone]     Name:  Eff. Date: 03/21/19-still active     Deduct: $2,000 ($0 met)      Out of Pocket Max: $6,000 (included deductible - $50 met)      Life Max:  CIR: 90% coverage, 10% co-insurance      SNF: 80% coverage, 20% co-insurance; with a limit of 60 days/cal yr Outpatient: 80% coverage, 20% co-insurance; limited by medical necessity     Home Health: 80% coverage, 20% co-insurance; limited by medical necessity     DME: 80% coverage, 20% co-insurance     Providers:  SECONDARY: None      Policy#:       Subscriber:  CM Name:       Phone#:      Fax#:  Pre-Cert#:       Employer:  Benefits:  Phone #:      Name:  Eff. Date:      Deduct:       Out of Pocket Max:       Life Max:  CIR:       SNF:  Outpatient:      Co-Pay:  Home Health:       Co-Pay:  DME:      Co-Pay:   Medicaid Application Date:       Case Manager:  Disability Application Date:       Case Worker:   The "Data Collection Information Summary" for patients in Inpatient Rehabilitation Facilities with attached "Privacy Act [place] Records" was provided and verbally reviewed with: N/A  Emergency Contact Information Contact Information    Name Relation Home Work Mobile   Dennard Spouse [phone]  [phone]     Current Medical History  Patient Admitting  Diagnosis: Bilateral upper and lower extremity on chronic weakness with associated sensory loss and ataxia in patient with multilevel spondylosis of cervical and lumbar spine and prior ACDF.   History of Present Illness: Pt is a 54 year old male with a h/o HTN, ETOH abuse which pt states he has stopped drinking for ~1 month, GERD, anemia, PNA, cervical spondylosis with myelopathy s/p cervical 6 corpectomy and C5 7 arthrodesis in October 2020.  Pt presented to ED on 06/27/19 d/t increasing tightness of extremities-difficult to move, difficulty walking, and increasing numbness of upper and lower extremities with recurrent falls, progressing to umbilicus as well as face and hesitancy. He has needed to use walker/cane for past couple of weeks.   He had severe hypokalemia --K+2.2 which was supplemented. MRI brain showed no acute abnormality. MRI cervical spine showed ACDF C5-C7 with resolved stenosis and regressed spinal cord  signal and myelomalacia, moderate neural foraminal stenosis C5 and C8 nerve roots and severe at left C6 nerve level. MRI lumbar spine showed progression of DD L4/L5 with new right foraminal anular fissure and moderate right L4 foraminal stenosis and advanced L5/S1 degeneration with moderate to severe L5 foraminal stenosis.  No surgical intervention needed per input from Dr. [name].   Lumbar puncture on 06/30/19 with evidence of albuminocytologic dissociation.  Pt treated for Guillain-Barre Syndrome with IVIG for 5 days.  Therapy evaluations done revealing ataxia with BLE weakness affecting functional status and CIR recommended for follow up therapy. Pt is to be admitted to CIR on 07/05/19.    Glasgow Coma Scale Score: 15  Past Medical History  Past Medical History:  Diagnosis Date  . Anemia    low iron  . Asthma    as a child  . Cervical spondylosis with myelopathy   . Fatty liver   . GERD (gastroesophageal reflux disease)   . Heart murmur    when he was younger   . Hypertension    . Pneumonia     Family History  family history includes Colon cancer in his sister; Heart attack in his father; Hypertension in his mother.  Prior Rehab/Hospitalizations:  Has the patient had prior rehab or hospitalizations prior to admission? Yes  Has the patient had major surgery during 100 days prior to admission? No  Current Medications   Current Facility-Administered Medications:  .  acetaminophen (TYLENOL) tablet 650 mg, 650 mg, Oral, PRN, Greta Doom, MD, 650 mg at 07/05/19 2119 .  amLODipine (NORVASC) tablet 10 mg, 10 mg, Oral, Daily, Rise Patience, MD, 10 mg at 07/06/19 0909 .  folic acid (FOLVITE) tablet 1 mg, 1 mg, Oral, Daily, Donnamae Jude, RPH, 1 mg at 07/06/19 9528 .  gabapentin (NEURONTIN) tablet 300 mg, 300 mg, Oral, TID, Lala Lund K, MD, 300 mg at 07/06/19 2144 .  heparin injection 5,000 Units, 5,000 Units, Subcutaneous, Q8H, Greta Doom, MD, 5,000 Units at 07/07/19 0523 .  hydrALAZINE (APRESOLINE) injection 10 mg, 10 mg, Intravenous, Q4H PRN, Rise Patience, MD, 10 mg at 06/28/19 4132 .  hydrALAZINE (APRESOLINE) tablet 100 mg, 100 mg, Oral, Q8H, Thurnell Lose, MD, 100 mg at 07/07/19 0523 .  lip balm (CARMEX) ointment, , Topical, PRN, Thurnell Lose, MD .  metoprolol tartrate (LOPRESSOR) tablet 100 mg, 100 mg, Oral, BID, Thurnell Lose, MD, 100 mg at 07/06/19 2143 .  nystatin (MYCOSTATIN) 100000 UNIT/ML suspension 500,000 Units, 5 mL, Oral, QID, Blount, Xenia T, NP, 500,000 Units at 07/06/19 2321 .  [DISCONTINUED] ondansetron (ZOFRAN) tablet 4 mg, 4 mg, Oral, Q6H PRN **OR** ondansetron (ZOFRAN) injection 4 mg, 4 mg, Intravenous, Q6H PRN, Rise Patience, MD .  sodium chloride tablet 1 g, 1 g, Oral, BID WC, Thurnell Lose, MD, 1 g at 07/07/19 0736 .  thiamine (B-1) injection 100 mg, 100 mg, Intravenous, Q24H, Greta Doom, MD, 100 mg at 07/06/19 4401 .  zolpidem (AMBIEN) tablet 5 mg, 5 mg, Oral, QHS  PRN, Thurnell Lose, MD, 5 mg at 07/05/19 2122  Patients Current Diet:  Diet Order            Diet Heart Room service appropriate? Yes; Fluid consistency: Thin  Diet effective now              Precautions / Restrictions Precautions Precautions: Fall Precaution Comments: ataxia Restrictions Weight Bearing Restrictions: No   Has the patient had  2 or more falls or a fall with injury in the past year?Yes  Prior Activity Level Community (5-7x/wk): pt still working  Prior Functional Level Prior Function Level of Independence: Independent Comments: working doing Surveyor, quantity work  Self Care: Did the patient need help bathing, dressing, using the toilet or eating?  Independent  Indoor Mobility: Did the patient need assistance with walking from room to room (with or without device)? Independent  Stairs: Did the patient need assistance with internal or external stairs (with or without device)? Independent  Functional Cognition: Did the patient need help planning regular tasks such as shopping or remembering to take medications? [place] / [place] Devices/Equipment: Environmental consultant (specify type), Cane (specify quad or straight) Home Equipment: Walker - 2 wheels  Prior Device Use: Indicate devices/aids used by the patient prior to current illness, exacerbation or injury? Per wife, pt tries not to use his walker or cane  Current Functional Level Cognition  Overall Cognitive Status: Within Functional Limits for tasks assessed Orientation Level: Oriented X4 General Comments: Pt very frustrated with current status. Works hard. Motivated.    Extremity Assessment (includes Sensation/Coordination)  Upper Extremity Assessment: RUE deficits/detail, LUE deficits/detail RUE Deficits / Details: discoordinated/ataxic movements noted bil. Good strength throughout (grossly 4/5). Pt endorses tingling in bil UEs digits to shoulder. RUE  Sensation: decreased light touch, decreased proprioception RUE Coordination: decreased fine motor, decreased gross motor LUE Deficits / Details: discoordinated/ataxic movements noted bil. Good strength throughout (grossly 4/5). Pt endorses tingling in bil UEs digits to shoulder. LUE Sensation: decreased light touch, decreased proprioception LUE Coordination: decreased fine motor, decreased gross motor  Lower Extremity Assessment: RLE deficits/detail, LLE deficits/detail, Generalized weakness RLE Deficits / Details: ataxia on standing, less so v. left, noting full ROM but poor eccentric control RLE Sensation: decreased light touch(tingling) RLE Coordination: decreased gross motor LLE Deficits / Details: decreased ROM in knee, unable to acheive full extension and noting abrupt contractions with poor eccentric control, worse vs. rigth LLE Sensation: decreased light touch(tingling) LLE Coordination: decreased gross motor    ADLs  Overall ADL's : Needs assistance/impaired Eating/Feeding: Set up, Sitting Eating/Feeding Details (indicate cue type and reason): Pt feeding self finger foods without assist and drinking from large mug with handle without assist. pt continues to require assist cutting food.  Ataxia makes keeping food on fork difficult. Grooming: Oral care, Wash/dry face, Minimal assistance, Sitting Grooming Details (indicate cue type and reason): opening and closing small containers is difficult.  Discussed other options such as large toothpaste pump, larger toothbrush etc.   Upper Body Bathing: Minimal assistance, Sitting Lower Body Bathing: Maximal assistance, +2 for physical assistance, Sit to/from stand Upper Body Dressing : Minimal assistance, Sitting Upper Body Dressing Details (indicate cue type and reason): donning new gown Lower Body Dressing: Maximal assistance, +2 for physical assistance, Sit to/from stand Lower Body Dressing Details (indicate cue type and reason): Pt unable to  sit in figure 4 to cross legs to donn socks and reaching forward was difficult today.  pt transferred sit to stand with mod assist x2 .  Once up pt cannot let go of walker on one side to assist with adls in standing.  Toilet Transfer: Minimal assistance, +2 for physical assistance, RW Toilet Transfer Details (indicate cue type and reason): Pt did have L knee buckle during transfer.  Assist provided from therapist to remain standing. pt requring cues to reach back to [facility] before sitting. Toileting- Clothing Manipulation and Hygiene: Moderate assistance, Sit  to/from stand Functional mobility during ADLs: Moderate assistance, +2 for physical assistance General ADL Comments: Pt limited with adls in standing due to poor leg strength and ataxia and adls limited in sitting due to UE burning sensation and poor coordination.    Mobility  Overal bed mobility: Needs Assistance Bed Mobility: Rolling, Sidelying to Sit Rolling: Min assist Sidelying to sit: Min assist Supine to sit: Min assist General bed mobility comments: assisted pt to come forward up via R elbow.    Transfers  Overall transfer level: Needs assistance Equipment used: Rolling walker (2 wheeled) Transfers: Sit to/from Stand, [employer] Transfers Sit to Stand: Mod assist, +2 physical assistance Stand pivot transfers: Mod assist, +2 physical assistance General transfer comment: Cues needed to use hands to push off surface leaving even though painful.  Pt with L knee buckle when up requring mod assist x2 to recover.    Ambulation / Gait / Stairs / Wheelchair Mobility  Ambulation/Gait Ambulation/Gait assistance: Mod assist, +2 physical assistance, +2 safety/equipment Gait Distance (Feet): 15 Feet(x2) Assistive device: Rolling walker (2 wheeled) Gait Pattern/deviations: Step-to pattern, Step-through pattern, Decreased step length - left, Decreased step length - right, Decreased stance time - left, Decreased stride length General Gait  Details: uncoordinated steps bil, but pt able to keep and appropriate BOS, keep each knee extended during stance, though unable to unlock knee for coordinated heel/toe pattern. Gait velocity interpretation: <1.31 ft/sec, indicative of household ambulator    Posture / Balance Dynamic Sitting Balance Sitting balance - Comments: can accept minimal challenge to balance with some truncal ataxia with reaction. Balance Overall balance assessment: Needs assistance Sitting-balance support: Feet supported, No upper extremity supported, Single extremity supported, Bilateral upper extremity supported Sitting balance-Leahy Scale: Fair Sitting balance - Comments: can accept minimal challenge to balance with some truncal ataxia with reaction. Standing balance support: Bilateral upper extremity supported, During functional activity Standing balance-Leahy Scale: Poor Standing balance comment: Pt heavily reliant on walker to remain standing. Pt unable to safely let go of one side of walker to do adls with other hand without losing balance.    Special needs/care consideration BiPAP/CPAP No CPM No Continuous Drip IV: immune globulin 10% IV infusion (should be off by 4/17-day of admit to CIR).  Dialysis No        Days NA Life Vest No Oxygen No Special Bed No Trach Size No Wound Vac (area) No      Location No Skin: excoriated area to right and left groin,               Bowel mgmt:No, last BM on 07/03/19 Bladder mgmt: yes, external catheter in place Diabetic mgmt No Behavioral consideration  No Chemo/radiation No     Previous Home Environment (from acute therapy documentation) Living Arrangements: Spouse/significant other  Lives With: Spouse Available Help at Discharge: Family Type of Home: House Home Layout: (12 steps inside house with no rails) Home Access: Stairs to enter Entrance Stairs-Rails: None Entrance Stairs-Number of Steps: slight step to get onto porch Bathroom Shower/Tub: Scientist, forensic: Standard Bathroom Accessibility: Yes How Accessible: Accessible via walker Home Care Services: No Additional Comments: Per wife, pt tries not to use walker or cane  Discharge Living Setting Plans for Discharge Living Setting: Patient's home Type of Home at Discharge: House Discharge Home Layout: (has 12 steps inside home with no rails) Discharge Home Access: Stairs to enter(slight step to get onto porch) Entrance Stairs-Rails: None Entrance Stairs-Number of Steps: slight step to  get onto porch Discharge Bathroom Shower/Tub: Tub/shower unit Discharge Bathroom Toilet: Standard Discharge Bathroom Accessibility: Yes How Accessible: Accessible via walker Does the patient have any problems obtaining your medications?: No  Social/Family/Support Systems Patient Roles: Spouse Anticipated Caregiver: Heinrich Fertig, pt's wife Anticipated Caregiver's Contact Information: [phone] Caregiver Availability: 24/7 Discharge Plan Discussed with Primary Caregiver: Yes Is Caregiver In Agreement with Plan?: Yes Does Caregiver/Family have Issues with Lodging/Transportation while Pt is in Rehab?: No   Goals/Additional Needs Patient/Family Goal for Rehab: Supervision-PT,Min A-OT Expected length of stay: 14-19 days Cultural considerations: NA Pt/family agrees to admission and willing to participate: yes Program orientation provided and reviewed with: pt and his wife Barriers to DC: pt will need to be a 1 person Min A to return home safely with wife.   Decrease burden of Care through IP rehab admission: NA   Possible need for SNF placement upon discharge: Not anticipated as pt has necessary confirmed support at DC from his wife. Anticipate pt can achieve supervision/Min A goals In a reasonable amount of time through CIR progress.    Patient Condition: This patient's medical and functional status has changed since the consult dated: 06/30/19 in which the Rehabilitation  Physician determined and documented that the patient's condition is appropriate for intensive rehabilitative care in an inpatient rehabilitation facility. See "History of Present Illness" (above) for medical update. Functional changes are: progression from no gait assessed by consult due to weakness to Mod A +2 15 feet (x2) using RW. Pt did decline from [place] for sit to stand transfers to Min A +2 and continues to require Min A to Max A +2 for ADLs. Patient's medical and functional status update has been discussed with the Rehabilitation physician and patient remains appropriate for inpatient rehabilitation. Will admit to inpatient rehab Saturday 07/05/19.  Preadmission Screen Completed By:  Raechel Ache, OT, 07/07/2019 10:27 AM with day prior to admit updates provided by Raechel Ache (OTR/L) on 07/04/19 at 6:22PM. ______________________________________________________________________   Discussed status with Dr. Dagoberto Ligas on 07/04/19 at 3:10PM and received approval for admission Saturday, 07/05/19.  Admission Coordinator:  Raechel Ache, with day prior to admit updates provided by Raechel Ache (OTR/L) on 07/04/19 at 6:22PM.

## 2019-07-01 NOTE — Telephone Encounter (Signed)
Noted.  I have been following.

## 2019-07-02 LAB — COMPREHENSIVE METABOLIC PANEL
ALT: 35 U/L (ref 0–44)
AST: 52 U/L — ABNORMAL HIGH (ref 15–41)
Albumin: 2.8 g/dL — ABNORMAL LOW (ref 3.5–5.0)
Alkaline Phosphatase: 75 U/L (ref 38–126)
Anion gap: 9 (ref 5–15)
BUN: 8 mg/dL (ref 6–20)
CO2: 24 mmol/L (ref 22–32)
Calcium: 9.1 mg/dL (ref 8.9–10.3)
Chloride: 98 mmol/L (ref 98–111)
Creatinine, Ser: 0.8 mg/dL (ref 0.61–1.24)
GFR calc Af Amer: >60 mL/min (ref >60)
GFR calc non Af Amer: >60 mL/min (ref >60)
Glucose, Bld: 107 mg/dL — ABNORMAL HIGH (ref 70–99)
Potassium: 4.7 mmol/L (ref 3.5–5.1)
Sodium: 131 mmol/L — ABNORMAL LOW (ref 135–145)
Total Bilirubin: 0.7 mg/dL (ref 0.3–1.2)
Total Protein: 9 g/dL — ABNORMAL HIGH (ref 6.5–8.1)

## 2019-07-02 LAB — CBC WITH DIFFERENTIAL/PLATELET
Abs Immature Granulocytes: 0.01 10*3/uL (ref 0.00–0.07)
Basophils Absolute: 0 10*3/uL (ref 0.0–0.1)
Basophils Relative: 0 %
Eosinophils Absolute: 0.1 10*3/uL (ref 0.0–0.5)
Eosinophils Relative: 1 %
HCT: 35.6 % — ABNORMAL LOW (ref 39.0–52.0)
Hemoglobin: 11.5 g/dL — ABNORMAL LOW (ref 13.0–17.0)
Immature Granulocytes: 0 %
Lymphocytes Relative: 16 %
Lymphs Abs: 0.8 10*3/uL (ref 0.7–4.0)
MCH: 34 pg (ref 26.0–34.0)
MCHC: 32.3 g/dL (ref 30.0–36.0)
MCV: 105.3 fL — ABNORMAL HIGH (ref 80.0–100.0)
Monocytes Absolute: 0.5 10*3/uL (ref 0.1–1.0)
Monocytes Relative: 10 %
Neutro Abs: 3.7 10*3/uL (ref 1.7–7.7)
Neutrophils Relative %: 73 %
Platelets: 401 10*3/uL — ABNORMAL HIGH (ref 150–400)
RBC: 3.38 MIL/uL — ABNORMAL LOW (ref 4.22–5.81)
RDW: 13.5 % (ref 11.5–15.5)
WBC: 5.2 10*3/uL (ref 4.0–10.5)
nRBC: 0 % (ref 0.0–0.2)

## 2019-07-02 LAB — HEAVY METALS PROFILE, URINE
Arsenic (Total),U: NOT DETECTED ug/L (ref 0–50)
Arsenic(Inorganic),U: NOT DETECTED ug/L (ref 0–19)
Creatinine(Crt),U: 0.87 g/L (ref 0.30–3.00)
Lead, Rand Ur: NOT DETECTED ug/L (ref 0–49)
Mercury, Ur: NOT DETECTED ug/L (ref 0–19)

## 2019-07-02 LAB — MAGNESIUM: Magnesium: 1.8 mg/dL (ref 1.7–2.4)

## 2019-07-02 MED ORDER — FUROSEMIDE 10 MG/ML IJ SOLN
20.0000 mg | Freq: Once | INTRAMUSCULAR | Status: AC
  Administered 2019-07-02: 20 mg via INTRAVENOUS
  Filled 2019-07-02: qty 2

## 2019-07-02 NOTE — Progress Notes (Signed)
Physical Therapy Treatment Patient Details Name: Jorge Weaver. MRN: [id number] DOB: [birth year] Today's Date: 07/02/2019    History of Present Illness Pt is a 54 y/o male who presents to the ER with increasing tightness of the extremities, difficulty moving/walking and increased numbness of the upper and lower extremities. MRI of the brain and C-spine were negative for acute changes. Neurosurgery consulted with no surgical/medical intervention recommended. Pt found to have severe hypokalemia and was admitted. PMHx includes anemia, HTN, PNA, hx of ACDF 12/2018    PT Comments    Pt agreeable to working up [place].  Pt pushed to ambulate and was able to advance his legs without assist to move them, though each was uncoordinated.    Follow Up Recommendations  CIR     Equipment Recommendations  None recommended by PT    Recommendations for Other Services Rehab consult     Precautions / Restrictions Precautions Precautions: Fall Precaution Comments: ataxia    Mobility  Bed Mobility Overal bed mobility: Needs Assistance Bed Mobility: Rolling;Sidelying to Sit     Supine to sit: Min assist     General bed mobility comments: assisted pt to come forward up via R elbow.  Transfers Overall transfer level: Needs assistance Equipment used: Rolling walker (2 wheeled) Transfers: Sit to/from [facility] Sit to Stand: Min assist;+2 safety/equipment(x3)         General transfer comment: worked on safe hand placement and trasition to standing.  Pt needed assist to come more forward before standing.  Ambulation/Gait Ambulation/Gait assistance: Mod assist;+2 physical assistance;+2 safety/equipment Gait Distance (Feet): 15 Feet(x2) Assistive device: Rolling walker (2 wheeled) Gait Pattern/deviations: Step-to pattern;Step-through pattern;Decreased step length - left;Decreased step length - right;Decreased stance time - left;Decreased stride length   Gait velocity  interpretation: <1.31 ft/sec, indicative of household ambulator General Gait Details: uncoordinated steps bil, but pt able to keep and appropriate BOS, keep each knee extended during stance, though unable to unlock knee for coordinated heel/toe pattern.   Stairs             Wheelchair Mobility    Modified Rankin (Stroke Patients Only)       Balance Overall balance assessment: Needs assistance Sitting-balance support: Feet supported;No upper extremity supported;Single extremity supported;Bilateral upper extremity supported Sitting balance-Leahy Scale: Fair Sitting balance - Comments: can accept minimal challenge to balance with some truncal ataxia with reaction.   Standing balance support: Bilateral upper extremity supported;During functional activity Standing balance-Leahy Scale: Poor                              Cognition Arousal/Alertness: Awake/alert Behavior During Therapy: WFL for tasks assessed/performed Overall Cognitive Status: Within Functional Limits for tasks assessed                                        Exercises      General Comments        Pertinent Vitals/Pain      Home Living                      Prior Function            PT Goals (current goals can now be found in the care plan section) Acute Rehab PT Goals Patient Stated Goal: fish, back to work  PT Goal Formulation: With patient  Time For Goal Achievement: 07/13/19 Potential to Achieve Goals: Good Progress towards PT goals: Progressing toward goals    Frequency    Min 3X/week      PT Plan      Co-evaluation              AM-PAC PT "6 Clicks" Mobility   Outcome Measure  Help needed turning from your back to your side while in a flat bed without using bedrails?: A Little Help needed moving from lying on your back to sitting on the side of a flat bed without using bedrails?: A Little Help needed moving to and from a bed to a chair  (including a wheelchair)?: A Little Help needed standing up from a chair using your arms (e.g., wheelchair or bedside chair)?: A Little Help needed to walk in hospital room?: A Lot Help needed climbing 3-5 steps with a railing? : A Lot 6 Click Score: 16    End of Session   Activity Tolerance: Patient tolerated treatment well Patient left: in chair;with call bell/phone within reach;with chair alarm set Nurse Communication: Mobility status PT Visit Diagnosis: Unsteadiness on feet (R26.81);Ataxic gait (R26.0);History of falling (Z91.81);Other symptoms and signs involving the nervous system (T62.263)     Time: 3354-5625 PT Time Calculation (min) (ACUTE ONLY): 18 min  Charges:  $Gait Training: 8-22 mins                     07/02/2019  Jacinto Halim., PT Acute Rehabilitation Services [phone]  (pager) [phone]  (office)   Eliseo Gum Saga [place] 07/02/2019, 4:12 PM

## 2019-07-02 NOTE — Progress Notes (Signed)
PROGRESS NOTE                                                                                                                                                                                                             Patient Demographics:    Jorge Weaver, is a 54 y.o. male, DOB - [birth year], VVO:[id number]  Admit date - 06/27/2019   Admitting Physician Eduard Clos, MD  Outpatient Primary MD for the patient is [name], DO  LOS - 4  Chief Complaint  Patient presents with  . Weakness       Brief Narrative  - 54 y.o. male with history of hypertension previous alcohol abuse which patient states has stopped drinking for almost a month now who has had a surgery in October 2024 cervical 6 corpectomy and C5 7 arthrodesis by Dr. Franky Macho presents to the ER because of increasing tightness of the extremities difficulty to move difficulty walking and increasing numbness of the upper and lower extremities.  Denies any nausea vomiting diarrhea headache chest pain.  Has been having recurrent falls.  In the ER his MRI brain and C-spine were nonacute, this was discussed by the admitting physician with neurologist on-call at night Dr. Wilford Corner, in the morning I discussed his MRI findings with neurosurgeon Dr. Maisie Fus.  No surgical intervention or medical intervention needed according to the specialist.  Incidentally he was found to have life-threatening low severe hypokalemia along with ongoing weakness and admitted to the hospital.   Subjective:   Patient in bed in no distress, denies any headache chest or abdominal pain, diffuse weakness and facial droop improved somewhat.   Assessment  & Plan :     1.  Generalized subacute weakness worse in the left upper extremity and left side of the face ongoing for several weeks in a patient with history of C-spine fusion surgery.  Case discussed by admitting physician with neurologist Dr. Wilford Corner and me with  neurosurgeon Dr. Maisie Fus.   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  Continue PT OT, may require rehab.  Overall weakness is improving but still long ways to go.  2.  Multiple falls at home with lip and tongue injury during a fall along with avulsed right big toe nail.  Due to muscular weakness from #1 above, supportive care and monitor.  3. HTN - on high doses of Norvasc, Lopressor and hydralazine, 1 dose of IV Lasix on 07/02/2019 and monitor.  4.  History of alcohol abuse.  Counseled to quit.  Placed on folic acid and thiamine.  Last drink 1 month ago.  5.  Severe hypokalemia.  Replaced and stable.    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  Consults  :  Neurology, N. Surgery Dr Maisie Fus over the phone - 06/28/19  Procedures  :    LP 06/30/19  MRI Brain - C Spine -   1. 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 2. Status post ACDF at C5-C6 and C6-C7. Resolved spinal stenosis at C6-C7, and regressed although not resolved abnormal spinal cord signal and enhancement centered at that level. 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 3. Mild cervical spinal stenosis elsewhere, including some residual cord mass effect related to C5 endplate spurring, but no other abnormal cord signal is identified. 4. Degenerative neural foraminal stenosis, up to  moderate at the left C5 and C8 nerve levels, and severe at the left C6 nerve level.   No acute intracranial abnormality and largely unremarkable for age MRI appearance of the brain.   DVT Prophylaxis  :   Heparin    Lab Results  Component Value Date   PLT 401 (H) 07/02/2019    Diet :  Diet Order            Diet Heart Room service appropriate? Yes; Fluid consistency: Thin  Diet effective now               Inpatient Medications  Scheduled Meds: . amLODipine  10 mg Oral Daily  . folic acid  1 mg Oral Daily  . furosemide  20 mg Intravenous Once  . gabapentin  300 mg Oral TID  . heparin injection (subcutaneous)  5,000 Units Subcutaneous Q8H  . hydrALAZINE  100 mg Oral Q8H  . metoprolol tartrate  100 mg Oral BID   Continuous Infusions: . Immune Globulin 10% 45 g (07/01/19 2035)  . thiamine injection 500 mg (07/02/19 0200)   PRN Meds:.acetaminophen, acetaminophen, diphenhydrAMINE, hydrALAZINE, lip balm, [DISCONTINUED] ondansetron **OR** ondansetron (ZOFRAN) IV, zolpidem  Antibiotics  :   Anti-infectives (From admission, onward)   None        Objective:   Vitals:   07/01/19 2116 07/01/19 2133 07/01/19 2243 07/02/19 0815  BP: (!) 139/99 (!) 136/114 (!) 155/114 (!) 157/117  Pulse:   94 100  Resp: Temp: 99.2 F (37.3 C) 98.6 F (37 C) 98.4 F (36.9 C) 97.8 F (36.6 C)  TempSrc: Oral Oral    SpO2:    93%  Weight:      Height:        SpO2: 93 %  Wt Readings from Last 3 Encounters:  06/28/19 112.4 kg  06/16/19 122.5 kg  06/09/19 122.2 kg     Intake/Output Summary (Last 24 hours) at 07/02/2019 1031 Last data filed at 07/02/2019 0326 Gross per 24 hour  Intake 1400 ml  Output --  Net 1400 ml     Physical Exam  Awake Alert, No new F.N deficits, generalized weakness in all 4 extremities with strength 5/5 but somewhat weaker in the left arm as compared to other limbs but this is chronic according to patient, mild left-sided facial droop    [place].AT,PERRAL Supple Neck,No JVD, No cervical lymphadenopathy appriciated.  Symmetrical Chest wall movement, Good air movement bilaterally, CTAB RRR,No Gallops, Rubs or new Murmurs, No Parasternal Heave +ve B.Sounds, Abd Soft, No tenderness, No organomegaly appriciated, No rebound - guarding or rigidity. No Cyanosis, Clubbing or edema, No new Rash or bruise     Data Review:    Recent Labs  Lab 06/28/19 0551 06/29/19 0533 06/30/19 1022 07/01/19 0227 07/02/19 0216  WBC 4.8 7.3 7.0 4.9 5.2  HGB 10.9* 12.0* 11.7* 10.9* 11.5*  HCT 32.5* 35.9* 35.6* 32.9* 35.6*  PLT 363 372 408* 400 401*  MCV 104.2* 105.0* 106.6* 105.1* 105.3*  MCH 34.9* 35.1* 35.0* 34.8* 34.0  MCHC 33.5 33.4 32.9 33.1 32.3  RDW 13.4 13.4 13.5 13.3 13.5  LYMPHSABS 1.2 1.8 1.6 1.2 0.8  MONOABS 0.6 0.6 0.4 0.4 0.5  EOSABS 0.0 0.1 0.1 0.1 0.1  BASOSABS 0.0 0.0 0.0 0.0 0.0    Recent Labs  Lab 06/27/19 1735 06/27/19 1846 06/27/19 2346 06/28/19 0551 06/28/19 0551 06/28/19 1429 06/29/19 0533 06/30/19 1022 07/01/19 0227 07/02/19 0216  NA   < >  --   --  137  --   --  136 135 131* 131*  K   < >  --   --  2.5*   < > 3.5 3.3* 3.9 4.9 4.7  CL   < >  --   --  92*  --   --  96* 99 97* 98  CO2   < >  --   --  35*  --   --  28 26 24 24   GLUCOSE   < >  --   --  119*  --   --  110* 138* 130* 107*  BUN   < >  --   --  6  --   --  <5* <5* 8 8  CREATININE   < >  --   --  0.79  --   --  0.77 0.77 0.81 0.80  CALCIUM   < >  --   --  8.5*  --   --  9.1 8.9 9.1 9.1  AST  --   --   --  51*  --   --  54* 46* 43* 52*  ALT  --   --   --  35  --   --  38 37 33 35  ALKPHOS  --   --   --  75  --   --  78 75 66 75  BILITOT  --   --   --  1.2  --   --  0.4 0.7 1.0 0.7  ALBUMIN  --   --   --  3.0*  --   --  3.1* 3.0* 2.7* 2.8*  MG  --  1.7  --   --   --   --  1.8 1.8 1.7 1.8  INR  --   --   --   --   --   --   --  0.8  --   --   TSH  --   --  1.777 1.501  --   --   --   --   --   --  BNP  --   --   --   --   --   --  48.2  --    --   --    < > = values in this interval not displayed.    Recent Labs  Lab 06/28/19 0042 06/29/19 0533  BNP  --  48.2  SARSCOV2NAA NEGATIVE  --     ------------------------------------------------------------------------------------------------------------------ No results for input(s): CHOL, HDL, LDLCALC, TRIG, CHOLHDL, LDLDIRECT in the last 72 hours.  Lab Results  Component Value Date   HGBA1C 6.1 08/07/2017   ------------------------------------------------------------------------------------------------------------------ No results for input(s): TSH, T4TOTAL, T3FREE, THYROIDAB in the last 72 hours.  Invalid input(s): FREET3 ------------------------------------------------------------------------------------------------------------------ No results for input(s): VITAMINB12, FOLATE, FERRITIN, TIBC, IRON, RETICCTPCT in the last 72 hours.  Coagulation profile Recent Labs  Lab 06/30/19 1022  INR 0.8    No results for input(s): DDIMER in the last 72 hours.  Cardiac Enzymes No results for input(s): CKMB, TROPONINI, MYOGLOBIN in the last 168 hours.  Invalid input(s): CK ------------------------------------------------------------------------------------------------------------------    Component Value Date/Time   BNP 48.2 06/29/2019 0533    Micro Results Recent Results (from the past 240 hour(s))  SARS CORONAVIRUS 2 (TAT 6-24 HRS) Nasopharyngeal Nasopharyngeal Swab     Status: None   Collection Time: 06/28/19 12:42 AM   Specimen: Nasopharyngeal Swab  Result Value Ref Range Status   SARS Coronavirus 2 NEGATIVE NEGATIVE Final    Comment: (NOTE) SARS-CoV-2 target nucleic acids are NOT DETECTED. The SARS-CoV-2 RNA is generally detectable in upper and lower respiratory specimens during the acute phase of infection. Negative results do not preclude SARS-CoV-2 infection, do not rule out co-infections with other pathogens, and should not be used as the sole basis for  treatment or other patient management decisions. Negative results must be combined with clinical observations, patient history, and epidemiological information. The expected result is Negative. Fact Sheet for Patients: HairSlick.no Fact Sheet for Healthcare Providers: quierodirigir.com This test is not yet approved or cleared by the [place] FDA and  has been authorized for detection and/or diagnosis of SARS-CoV-2 by FDA under an Emergency Use Authorization (EUA). This EUA will remain  in effect (meaning this test can be used) for the duration of the COVID-19 declaration under Section 56 4(b)(1) of the Act, 21 U.S.C. section 360bbb-3(b)(1), unless the authorization is terminated or revoked sooner. Performed at [facility], [address] [place], [place] [address]     Radiology Reports MR Brain W and Wo Contrast  Result Date: 06/27/2019 CLINICAL DATA:  54 year old male with increasing neurologic deficits, involuntary twitches and spasms. Status post cervical spine ACDF on 01/16/2019. EXAM: MRI HEAD WITHOUT AND WITH CONTRAST TECHNIQUE: Multiplanar, multiecho pulse sequences of the brain and surrounding structures were obtained without and with intravenous contrast. CONTRAST:  10mL GADAVIST GADOBUTROL 1 MMOL/ML IV SOLN COMPARISON:  No prior head MRI.  Head CT 10/15/2013. FINDINGS: Brain: Cerebral volume is similar to that in 2015. No restricted diffusion to suggest acute infarction. No midline shift, mass effect, evidence of mass lesion, ventriculomegaly, extra-axial collection or acute intracranial hemorrhage. Cervicomedullary junction and pituitary are within normal limits. Largely normal for age gray and white matter signal, minimal scattered nonspecific cerebral white matter T2 and FLAIR hyperintensity. No cortical encephalomalacia or chronic cerebral blood products. No abnormal enhancement identified. No dural thickening.  Vascular: Major intracranial vascular flow voids are preserved. The major dural venous sinuses are enhancing and appear to be patent. Skull and upper cervical spine: Cervical spine  details are reported separately. Visualized bone marrow signal is within normal limits. Sinuses/Orbits: Negative orbits. Paranasal sinuses and mastoids are stable and well pneumatized. Other: Scalp and face soft tissues appear negative. There is a tiny right nasopharyngeal retention cyst, inconsequential. IMPRESSION: No acute intracranial abnormality and largely unremarkable for age MRI appearance of the brain. Electronically Signed   By: Odessa Fleming M.D.   On: 06/27/2019 22:32   MR Cervical Spine W or Wo Contrast  Result Date: 06/27/2019 CLINICAL DATA:  54 year old male with increasing neurologic deficits, involuntary twitches and spasms. Status post cervical spine ACDF on 01/16/2019. EXAM: MRI CERVICAL SPINE WITHOUT AND WITH CONTRAST TECHNIQUE: Multiplanar and multiecho pulse sequences of the cervical spine, to include the craniocervical junction and cervicothoracic junction, were obtained without and with intravenous contrast. CONTRAST:  35mL GADAVIST GADOBUTROL 1 MMOL/ML IV SOLN COMPARISON:  Preoperative cervical spine MRI with contrast on 01/16/2019. And outside preoperative cervical spine without contrast 01/02/2019, now available on [employer]. FINDINGS: Alignment: Straightening of cervical lordosis, less reversal compared to October. There may be mild residual retrolisthesis of C5 on C6. Vertebrae: Mild hardware susceptibility artifact from C5-C6 and C6-C7 ACDF, new from prior MRI. No marrow edema or evidence of acute osseous abnormality. Background bone marrow signal within normal limits. Cord: The spinal cord remains abnormal at C6-C7, although the extent of abnormal cord T2 signal is regressed compared to 01/02/2019, where the T2 abnormality continued to the C7-T1 level. Patchy abnormal T2 signal is now confined to the C6-C7  level, and on axial images appears to spare only the most peripheral cord white matter (series 9, image 33). The cord also appears less expanded. And following contrast there is only a faint 5 mm focus of residual enhancement (9 mm and more intensely enhancing previously). Above and below that level cord signal appears to remain normal. No other abnormal intradural enhancement. No dural thickening. Posterior Fossa, vertebral arteries, paraspinal tissues: Cervicomedullary junction is within normal limits. Preserved major vascular flow voids in the neck. Negative visible neck soft tissues. Disc levels: There is a congenital degree of cervical spinal canal narrowing related to short pedicles, with superimposed degenerative changes: C2-C3: Mild disc bulge and endplate spurring plus ligament flavum hypertrophy. Mild spinal stenosis but no cord mass effect (series 10, image 6). C3-C4: Mild disc bulge and ligament flavum hypertrophy. Mild spinal stenosis but no cord mass effect. C4-C5: Circumferential disc bulge and mild endplate spurring. Less ligament flavum hypertrophy here. Mild facet hypertrophy. Mild spinal stenosis. No cord mass effect. Mild to moderate C5 foraminal stenosis greater on the left. C5-C6: Interval ACDF. But there does appear to be residual spinal stenosis with mild cord mass effect at the C5 inferior endplate level (series 11, image 27). No cord signal abnormality. Endplate spurring results in severe left and mild right C6 foraminal stenosis (same image). C6-C7: Interval ACDF. Ligament flavum hypertrophy but no significant spinal stenosis. Foraminal patency also appears improved, mild residual stenosis on the right. C7-T1: Mostly foraminal disc bulge and endplate spurring. No spinal stenosis. Moderate left and mild right C8 foraminal stenosis. No upper thoracic spinal stenosis. IMPRESSION: 1. 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 2. Status post ACDF at C5-C6 and C6-C7.  Resolved spinal stenosis at C6-C7, and regressed although not resolved abnormal spinal cord signal and enhancement centered at that level. 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 3. Mild cervical spinal stenosis elsewhere, including some residual cord mass effect related to C5 endplate spurring, but  no other abnormal cord signal is identified. 4. Degenerative neural foraminal stenosis, up to moderate at the left C5 and C8 nerve levels, and severe at the left C6 nerve level. Preliminary results of this exam were discussed by telephone with Dr. [place] Sheldon in the ED. Electronically Signed   By: Odessa Fleming M.D.   On: 06/27/2019 22:47   MR THORACIC SPINE W WO CONTRAST  Result Date: 06/30/2019 CLINICAL DATA:  A shin initial evaluation for 1 month history of progressive weakness. EXAM: MRI THORACIC WITHOUT AND WITH CONTRAST TECHNIQUE: Multiplanar and multiecho pulse sequences of the thoracic spine were obtained without and with intravenous contrast. CONTRAST:  47mL GADAVIST GADOBUTROL 1 MMOL/ML IV SOLN COMPARISON:  Prior MRI from 01/02/2019. FINDINGS: MRI THORACIC SPINE FINDINGS Alignment: Examination moderately degraded by motion artifact. Mild dextroscoliosis. Alignment otherwise normal with preservation of the normal thoracic kyphosis. No listhesis. Vertebrae: Vertebral body height maintained without evidence for acute or chronic fracture. Bone marrow signal intensity within normal limits. Few scattered subcentimeter benign hemangiomata noted. No worrisome osseous lesions. No abnormal marrow edema or enhancement. Cord: No definite cord signal abnormality seen on this motion degraded exam. Cord caliber and morphology within normal limits. No appreciable abnormal enhancement. Paraspinal and other soft tissues: Paraspinous soft tissues demonstrate no acute finding. Partially visualized lungs are clear. Exophytic T2 hyperintense simple cyst partially visualized extending  from the posterior right kidney. Visualized visceral structures otherwise unremarkable. Disc levels: T1-2:  Unremarkable. T2-3: Negative interspace. Left-sided facet hypertrophy. No spinal stenosis. Mild left foraminal narrowing. T3-4: Diffuse disc bulge with superimposed left foraminal disc protrusion (series 19, image 8). Right greater than left facet hypertrophy. Resultant mild spinal stenosis without cord deformity. Mild left foraminal narrowing. T4-5: Mild disc bulge. Posterior element hypertrophy. No significant spinal stenosis. Foramina remain patent. T5-6: Tiny right paracentral disc protrusion (series 19, image 14). Posterior element hypertrophy. Mild prominence of the dorsal epidural fat. No significant spinal stenosis. Foramina remain patent. T6-7: Diffuse disc bulge. Superimposed small left paracentral disc protrusion mildly indents the left ventral thecal sac (series 19, image 17). Posterior element hypertrophy. Prominence of the dorsal epidural fat. No significant stenosis. T7-8: Prominence of the dorsal epidural fat with mild facet hypertrophy. Otherwise negative. No stenosis. T8-9: Minimal disc bulge. Posterior element hypertrophy with prominence of the dorsal epidural fat. No stenosis. T9-10: Mild diffuse disc bulge. Moderate bilateral facet hypertrophy. Resultant mild spinal stenosis. Moderate left with mild right foraminal narrowing. T10-11: Diffuse disc bulge. Right greater than left facet hypertrophy. Resultant mild to moderate spinal stenosis (series 19, image 29). Mild left with moderate right foraminal narrowing. T11-12: Minimal disc bulge. Bilateral facet hypertrophy. No significant stenosis. T12-L1: Mild facet hypertrophy. Otherwise unremarkable. No stenosis. IMPRESSION: 1. Technically limited exam due to motion artifact. 2. Grossly normal MRI appearance of the thoracic cord with no cord signal abnormality identified. No acute abnormality or findings to explain patient's symptoms  identified. 3. Multilevel degenerative disc bulging with facet hypertrophy as above. Resultant mild to moderate spinal stenosis at T9-10 and T10-11. Electronically Signed   By: Rise Mu M.D.   On: 06/30/2019 05:23   MR Lumbar Spine W Wo Contrast  Result Date: 06/27/2019 CLINICAL DATA:  54 year old male with increasing neurologic deficits, involuntary twitches and spasms. Status post cervical spine ACDF on 01/16/2019. EXAM: MRI LUMBAR SPINE WITHOUT AND WITH CONTRAST TECHNIQUE: Multiplanar and multiecho pulse sequences of the lumbar spine were obtained without and with intravenous contrast. CONTRAST:  24mL GADAVIST GADOBUTROL 1 MMOL/ML IV SOLN  in conjunction with contrast enhanced imaging of the head and cervical spine reported separately. COMPARISON:  [facility] Lumbar MRIs 04/19/2019, 12/07/2018 FINDINGS: Segmentation:  Normal. Alignment: Stable degenerative appearing retrolisthesis at L5-S1, straightening of lumbar lordosis elsewhere. There is also mild chronic retrolisthesis of L2 on L3. Vertebrae: No marrow edema or evidence of acute osseous abnormality. Visualized bone marrow signal is within normal limits. Intact visible sacrum and SI joints. Conus medullaris and cauda equina: Conus extends to the T12-L1 level. No lower spinal cord or conus signal abnormality. No abnormal intradural enhancement. No dural thickening. Paraspinal and other soft tissues: Stable and negative; benign appearing right renal midpole cyst. Disc levels: Since 04/19/2019 lower thoracic and lumbar thecal sac patency appears mildly improved above L4-L5 due to regression of epidural lipomatosis. At L2-L3 circumferential disc bulge with small posterior annular fissure and mild posterior element hypertrophy persist. Mild residual spinal stenosis at that level, improved from January. L4-L5: Persistent mild to moderate spinal stenosis in part due to epidural lipomatosis. Circumferential disc bulge, progressed  right foraminal annular fissure since January (series 8 image 28). Moderate facet hypertrophy with degenerative facet joint fluid. No convincing lateral recess stenosis. Moderate right L4 foraminal stenosis. L5-S1: Continued epidural lipomatosis. Chronic retrolisthesis and disc space loss with circumferential disc osteophyte complex and moderate posterior element hypertrophy. Stable spinal and mild lateral recess stenosis. Stable moderate to severe left and moderate right L5 foraminal stenosis. IMPRESSION: 1. Lumbar thecal sac patency has improved since the January MRI at all levels above L4-L5, due to regressed epidural lipomatosis. 2. Chronic disc degeneration at L4-L5 has progressed in the form of new right foraminal annular fissure. Moderate right L4 foraminal stenosis. Mild to moderate multifactorial spinal stenosis is stable. 3. Advanced chronic L5-S1 degeneration is stable with mild spinal and lateral recess stenosis, and moderate to severe L5 foraminal stenosis greater on the left. Electronically Signed   By: Genevie Ann M.D.   On: 06/27/2019 22:54    Time Spent in minutes  30   Lala Lund M.D on 07/02/2019 at 10:31 AM  To page go to [email or web address] - password [email or web address]

## 2019-07-02 NOTE — Progress Notes (Addendum)
NEUROLOGY PROGRESS NOTE   Subjective: Patient states that he feels like he is getting stronger in his lower extremities.  Exam: Vitals:   07/01/19 2243 07/02/19 0815  BP: (!) 155/114 (!) 157/117  Pulse: 94 100  Resp: 20 20  Temp: 98.4 F (36.9 C) 97.8 F (36.6 C)  SpO2:  93%    Physical Exam  Constitutional: Appears well-developed and well-nourished.  Psych: Affect appropriate to situation Eyes: No scleral injection HENT: No OP obstrucion Head: Normocephalic.  Cardiovascular: Normal rate and regular rhythm.  Respiratory: Effort normal, non-labored breathing GI: Soft.  No distension. There is no tenderness.  Skin: WDI   Neuro:  Mental Status: Alert, oriented, thought content appropriate.  Speech fluent without evidence of aphasia.  Able to follow 3 step commands without difficulty. Cranial Nerves: II:  Visual fields grossly normal,  III,IV, VI: ptosis not present, extra-ocular motions intact bilaterally pupils equal, round, reactive to light and accommodation V,VII: smile symmetric, facial light touch sensation normal bilaterally XII: midline tongue extension Motor: Right : Upper extremity   5/5    Left:     Upper extremity   5/5 -Right lower extremity-hip flexion 4/5, knee extension 5/5, knee flexion 4/5, dorsi flexion and plantar flexion 5/5. -Left lower extremity-hip flexion 4/5, knee flexion and extension 5/5, dorsi flexion and plantar flexion 5/5. Tone and bulk:normal tone throughout; no atrophy noted Sensory: Continues to have no vibratory sensation until you get to C7, temperature, pinprick, light touch decreased up to mid calf, no proprioception noted. Deep Tendon Reflexes: Continue to be absent.     Medications:  Scheduled: . amLODipine  10 mg Oral Daily  . folic acid  1 mg Oral Daily  . gabapentin  300 mg Oral TID  . heparin injection (subcutaneous)  5,000 Units Subcutaneous Q8H  . hydrALAZINE  100 mg Oral Q8H  . metoprolol tartrate  100 mg Oral BID    Continuous: . Immune Globulin 10% 45 g (07/01/19 2035)  . thiamine injection 500 mg (07/02/19 0200)   JIR:CVELFYBOFBPZW, acetaminophen, diphenhydrAMINE, hydrALAZINE, lip balm, [DISCONTINUED] ondansetron **OR** ondansetron (ZOFRAN) IV, zolpidem  Pertinent Labs/Diagnostics: -Sodium 131 -Glucose 107 -AST 52 -Ceruloplasmin 22.9 -Vitamin E pending  Felicie Morn PA-C [facility] [phone]  I have seen the patient reviewed the above note.  He is subjectively improving per patient.  Heavy metal screen is negative.  Assessment:  54 year old male with acute sensory ataxia/sensation loss associated with albuminocytologic dissociation.  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  He is subjectively having improvement.  Today will be day 3 of IVIG.  Recommendations: -PT/OT -IVIG for total of 5 days as stated this will be day 3  We will continue to follow  Ritta Slot, MD [facility] [phone]  If 7pm- 7am, please page neurology on call as listed in [facility]ION.

## 2019-07-03 LAB — COMPREHENSIVE METABOLIC PANEL
ALT: 37 U/L (ref 0–44)
AST: 63 U/L — ABNORMAL HIGH (ref 15–41)
Albumin: 2.6 g/dL — ABNORMAL LOW (ref 3.5–5.0)
Alkaline Phosphatase: 75 U/L (ref 38–126)
Anion gap: 9 (ref 5–15)
BUN: 10 mg/dL (ref 6–20)
CO2: 23 mmol/L (ref 22–32)
Calcium: 8.9 mg/dL (ref 8.9–10.3)
Chloride: 97 mmol/L — ABNORMAL LOW (ref 98–111)
Creatinine, Ser: 0.97 mg/dL (ref 0.61–1.24)
GFR calc Af Amer: >60 mL/min (ref >60)
GFR calc non Af Amer: >60 mL/min (ref >60)
Glucose, Bld: 119 mg/dL — ABNORMAL HIGH (ref 70–99)
Potassium: 5.2 mmol/L — ABNORMAL HIGH (ref 3.5–5.1)
Sodium: 129 mmol/L — ABNORMAL LOW (ref 135–145)
Total Bilirubin: 0.7 mg/dL (ref 0.3–1.2)
Total Protein: 9.3 g/dL — ABNORMAL HIGH (ref 6.5–8.1)

## 2019-07-03 LAB — SODIUM, URINE, RANDOM: Sodium, Ur: 65 mmol/L

## 2019-07-03 LAB — CBC WITH DIFFERENTIAL/PLATELET
Abs Immature Granulocytes: 0.04 10*3/uL (ref 0.00–0.07)
Basophils Absolute: 0 10*3/uL (ref 0.0–0.1)
Basophils Relative: 1 %
Eosinophils Absolute: 0 10*3/uL (ref 0.0–0.5)
Eosinophils Relative: 0 %
HCT: 34.8 % — ABNORMAL LOW (ref 39.0–52.0)
Hemoglobin: 11.8 g/dL — ABNORMAL LOW (ref 13.0–17.0)
Immature Granulocytes: 1 %
Lymphocytes Relative: 13 %
Lymphs Abs: 0.9 10*3/uL (ref 0.7–4.0)
MCH: 34.7 pg — ABNORMAL HIGH (ref 26.0–34.0)
MCHC: 33.9 g/dL (ref 30.0–36.0)
MCV: 102.4 fL — ABNORMAL HIGH (ref 80.0–100.0)
Monocytes Absolute: 0.8 10*3/uL (ref 0.1–1.0)
Monocytes Relative: 12 %
Neutro Abs: 4.9 10*3/uL (ref 1.7–7.7)
Neutrophils Relative %: 73 %
Platelets: 292 10*3/uL (ref 150–400)
RBC: 3.4 MIL/uL — ABNORMAL LOW (ref 4.22–5.81)
RDW: 13.5 % (ref 11.5–15.5)
WBC: 6.7 10*3/uL (ref 4.0–10.5)
nRBC: 0 % (ref 0.0–0.2)

## 2019-07-03 LAB — URINALYSIS, ROUTINE W REFLEX MICROSCOPIC
Bilirubin Urine: NEGATIVE
Glucose, UA: NEGATIVE mg/dL
Hgb urine dipstick: NEGATIVE
Ketones, ur: NEGATIVE mg/dL
Nitrite: NEGATIVE
Protein, ur: 30 mg/dL — AB
Specific Gravity, Urine: 1.014 (ref 1.005–1.030)
WBC, UA: >50 WBC/hpf — ABNORMAL HIGH (ref 0–5)
pH: 6 (ref 5.0–8.0)

## 2019-07-03 LAB — PROCALCITONIN: Procalcitonin: 0.28 ng/mL

## 2019-07-03 LAB — OSMOLALITY: Osmolality: 280 mOsm/kg (ref 275–295)

## 2019-07-03 LAB — MAGNESIUM: Magnesium: 1.8 mg/dL (ref 1.7–2.4)

## 2019-07-03 LAB — OSMOLALITY, URINE: Osmolality, Ur: 429 mOsm/kg (ref 300–900)

## 2019-07-03 LAB — URIC ACID: Uric Acid, Serum: 3.4 mg/dL — ABNORMAL LOW (ref 3.7–8.6)

## 2019-07-03 MED ORDER — FUROSEMIDE 10 MG/ML IJ SOLN
40.0000 mg | Freq: Once | INTRAMUSCULAR | Status: AC
  Administered 2019-07-03: 40 mg via INTRAVENOUS
  Filled 2019-07-03: qty 4

## 2019-07-03 MED ORDER — SODIUM POLYSTYRENE SULFONATE 15 GM/60ML PO SUSP
30.0000 g | Freq: Once | ORAL | Status: AC
  Administered 2019-07-03: 30 g via ORAL
  Filled 2019-07-03: qty 120

## 2019-07-03 MED ORDER — THIAMINE HCL 100 MG/ML IJ SOLN
100.0000 mg | INTRAMUSCULAR | Status: DC
  Administered 2019-07-04 – 2019-07-06 (×3): 100 mg via INTRAVENOUS
  Filled 2019-07-03 (×3): qty 2

## 2019-07-03 MED ORDER — LACTATED RINGERS IV SOLN: INTRAVENOUS | Status: DC

## 2019-07-03 NOTE — Progress Notes (Signed)
Occupational Therapy Treatment Patient Details Name: Jorge Weaver. MRN: [id number] DOB: 01-10-1966 Today's Date: 07/03/2019    History of present illness Pt is a 54 y/o male who presents to the ER with increasing tightness of the extremities, difficulty moving/walking and increased numbness of the upper and lower extremities. MRI of the brain and C-spine were negative for acute changes. Neurosurgery consulted with no surgical/medical intervention recommended. Pt found to have severe hypokalemia and was admitted. PMHx includes anemia, HTN, PNA, hx of ACDF 12/2018   OT comments  Pt continues to have difficulty coming sit to stand and maintaining balance in standing to attempt adls in standing. Pt with L knee buckle when up requiring mod assist to recover. Pt limited with adls in standing due to leg weakness and poor balance/ataxia.  Pt feeding self but has coordination issues with all UE adls and reports burning pain in BUES making using his arms very difficult.  Pt has very supportive family with 5 children and was fully independent PTA. Cont to feel in patient rehab is the best option.  Will continue with focus on self feeding and grooming tasks in standing.   Follow Up Recommendations  CIR;Supervision/Assistance - 24 hour    Equipment Recommendations  3 in 1 bedside commode;Other (comment)    Recommendations for Other Services      Precautions / Restrictions Precautions Precautions: Fall Precaution Comments: ataxia Restrictions Weight Bearing Restrictions: No       Mobility Bed Mobility Overal bed mobility: Needs Assistance Bed Mobility: Rolling;Sidelying to Sit Rolling: Min assist Sidelying to sit: Min assist       General bed mobility comments: assisted pt to come forward up via R elbow.  Transfers Overall transfer level: Needs assistance Equipment used: Rolling walker (2 wheeled) Transfers: Sit to/from [employer] Sit to Stand: Mod assist;+2 physical  assistance Stand pivot transfers: Mod assist;+2 physical assistance       General transfer comment: Cues needed to use hands to push off surface leaving even though painful.  Pt with L knee buckle when up requring mod assist x2 to recover.    Balance Overall balance assessment: Needs assistance Sitting-balance support: Feet supported;No upper extremity supported;Single extremity supported;Bilateral upper extremity supported Sitting balance-Leahy Scale: Fair Sitting balance - Comments: can accept minimal challenge to balance with some truncal ataxia with reaction.   Standing balance support: Bilateral upper extremity supported;During functional activity Standing balance-Leahy Scale: Poor Standing balance comment: Pt heavily reliant on walker to remain standing. Pt unable to safely let go of one side of walker to do adls with other hand without losing balance.                           ADL either performed or assessed with clinical judgement   ADL Overall ADL's : Needs assistance/impaired Eating/Feeding: Set up;Sitting Eating/Feeding Details (indicate cue type and reason): Pt feeding self finger foods without assist and drinking from large mug with handle without assist. pt continues to require assist cutting food.  Ataxia makes keeping food on fork difficult. Grooming: Oral care;Wash/dry face;Minimal assistance;Sitting Grooming Details (indicate cue type and reason): opening and closing small containers is difficult.  Discussed other options such as large toothpaste pump, larger toothbrush etc.               Lower Body Dressing: Maximal assistance;+2 for physical assistance;Sit to/from stand Lower Body Dressing Details (indicate cue type and reason): Pt unable to sit in  figure 4 to cross legs to donn socks and reaching forward was difficult today.  pt transferred sit to stand with mod assist x2 .  Once up pt cannot let go of walker on one side to assist with adls in  standing.  Toilet Transfer: Minimal assistance;+2 for physical assistance;RW Toilet Transfer Details (indicate cue type and reason): Pt did have L knee buckle during transfer.  Assist provided from therapist to remain standing. pt requring cues to reach back to [facility] before sitting. Toileting- Clothing Manipulation and Hygiene: Moderate assistance;Sit to/from stand       Functional mobility during ADLs: Moderate assistance;+2 for physical assistance General ADL Comments: Pt limited with adls in standing due to poor leg strength and ataxia and adls limited in sitting due to UE burning sensation and poor coordination.     Vision   Vision Assessment?: No apparent visual deficits   Perception     Praxis      Cognition Arousal/Alertness: Awake/alert Behavior During Therapy: WFL for tasks assessed/performed Overall Cognitive Status: Within Functional Limits for tasks assessed                                 General Comments: Pt very frustrated with current status. Works hard. Motivated.        Exercises Exercises: Shoulder General Exercises - Upper Extremity Shoulder Flexion: AROM;Strengthening;Both;10 reps;Seated Elbow Flexion: AROM;Strengthening;Both;10 reps;Seated Elbow Extension: AROM;Strengthening;Both;10 reps;Seated   Shoulder Instructions       General Comments Pt very weak in UE and LE.  UE more affected by poor coordination and burning sensations.      Pertinent Vitals/ Pain       Pain Assessment: Faces Faces Pain Scale: Hurts little more Pain Location: burning in arms Pain Descriptors / Indicators: Burning Pain Intervention(s): Limited activity within patient's tolerance;Monitored during session;Repositioned  Home Living                                          Prior Functioning/Environment              Frequency  Min 2X/week        Progress Toward Goals  OT Goals(current goals can now be found in the care plan  section)  Progress towards OT goals: Progressing toward goals  Acute Rehab OT Goals Patient Stated Goal: fish, back to work  OT Goal Formulation: With patient Time For Goal Achievement: 07/13/19 Potential to Achieve Goals: Good ADL Goals Pt Will Perform Grooming: with modified independence;standing Pt Will Perform Lower Body Bathing: with modified independence;sit to/from stand Pt Will Perform Upper Body Dressing: with modified independence;sitting Pt Will Perform Lower Body Dressing: with modified independence;sit to/from stand Pt Will Transfer to Toilet: with modified independence;ambulating Pt Will Perform Toileting - Clothing Manipulation and hygiene: with modified independence;sit to/from stand Pt/caregiver will Perform Home Exercise Program: Both right and left upper extremity;With written HEP provided;Independently  Plan Discharge plan remains appropriate    Co-evaluation                 AM-PAC OT "6 Clicks" Daily Activity     Outcome Measure   Help from another person eating meals?: A Little Help from another person taking care of personal grooming?: A Little Help from another person toileting, which includes using toliet, bedpan, or urinal?: A Lot Help from  another person bathing (including washing, rinsing, drying)?: A Lot Help from another person to put on and taking off regular upper body clothing?: A Little Help from another person to put on and taking off regular lower body clothing?: A Lot 6 Click Score: 15    End of Session Equipment Utilized During Treatment: Rolling walker  OT Visit Diagnosis: Other abnormalities of gait and mobility (R26.89);Ataxia, unspecified (R27.0);History of falling (Z91.81)   Activity Tolerance Patient limited by fatigue   Patient Left in chair;with call bell/phone within reach;with chair alarm set   Nurse Communication Mobility status        Time: 3795-5831 OT Time Calculation (min): 21 min  Charges: OT General  Charges $OT Visit: 1 Visit OT Treatments $Self Care/Home Management : 8-22 mins   Hope Budds 07/03/2019, 11:43 AM

## 2019-07-03 NOTE — Progress Notes (Signed)
Subjective: Feels he is improving.   Exam: Vitals:   07/02/19 2111 07/03/19 0900  BP: (!) 131/97 (!) 131/97  Pulse:    Resp: 18 18  Temp: 98.8 F (37.1 C) 100.1 F (37.8 C)  SpO2:  95%   Gen: In bed, NAD Resp: non-labored breathing, no acute distress Abd: soft, nt  Neuro: MS: awake, alert,  IR:CVELF, EOMI Motor: He actually has fairly good effort in all extremities today, 4+/5 hip flexion, otherwise 5/5. Sensory: Decreased temperature to the mid thigh bilaterally and to mid humeral arm bilaterally. Cerebellar: He continues to have significant ataxia, but I think it is slightly improved from previous days.  Pertinent Labs: Heavy metals-negative Ceruloplasmin-normal RPR-negative MMA-pending [id number] TSH-normal B1-pending  Impression: 54 year old male with acute sensory ataxia/sensation loss associated with albuminocytologic dissociation.  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   Today will be day 4 of IVIG.  Recommendations: 1) continue IVIG 2) he has completed 3 days of high-dose thiamine, can reduce to 100 mg daily  3) neurology will continue to follow  Ritta Slot, MD [facility] [phone]  If 7pm- 7am, please page neurology on call as listed in AMION.

## 2019-07-03 NOTE — Progress Notes (Signed)
PROGRESS NOTE                                                                                                                                                                                                             Patient Demographics:    Jorge Weaver, is a 54 y.o. male, DOB - [birth year], WJX:[id number]  Admit date - 06/27/2019   Admitting Physician Eduard ClosArshad N Kakrakandy, MD  Outpatient Primary MD for the patient is Sharlene DoryWendling, Nicholas Paul, DO  LOS - 5  Chief Complaint  Patient presents with   Weakness       Brief Narrative  - 54 y.o. male with history of hypertension previous alcohol abuse which patient states has stopped drinking for almost a month now who has had a surgery in October 2024 cervical 6 corpectomy and C5 7 arthrodesis by Dr. Franky Machoabbell presents to the ER because of increasing tightness of the extremities difficulty to move difficulty walking and increasing numbness of the upper and lower extremities.  Denies any nausea vomiting diarrhea headache chest pain.  Has been having recurrent falls.  In the ER his MRI brain and C-spine were nonacute, this was discussed by the admitting physician with neurologist on-call at night Dr. Wilford CornerArora, in the morning I discussed his MRI findings with neurosurgeon Dr. Maisie Fushomas.  No surgical intervention or medical intervention needed according to the specialist.  Incidentally he was found to have life-threatening low severe hypokalemia along with ongoing weakness and admitted to the hospital.   Subjective:   Patient in bed, appears comfortable, denies any headache, no fever, no chest pain or pressure, no shortness of breath , no abdominal pain. No focal weakness.   Assessment  & Plan :     1.  Generalized subacute weakness worse in the left upper extremity and left side of the face ongoing for several weeks in a patient with history of C-spine fusion surgery.  Case discussed by admitting physician with neurologist  Dr. Wilford CornerArora and me with neurosurgeon Dr. Maisie Fushomas.   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  Continue PT OT, may require rehab.  Overall weakness is improving  but still long ways to go.   2.  Multiple falls at home with lip and tongue injury during a fall along with avulsed right big toe nail.  Due to muscular weakness from #1 above, supportive care and monitor.  3. HTN - on high doses of Norvasc, Lopressor and hydralazine, 1 dose of IV Lasix on 07/02/2019 and monitor.  4.  History of alcohol abuse.  Counseled to quit.  Placed on folic acid and thiamine.  Last drink 1 month ago.  5.  Hyperkalemia.  Lasix and monitor.  6.  Hyponatremia.  Electrolyte studies suggest SIADH, fluid restriction and gentle Lasix.  Repeat BMP in the morning.    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  Consults  :  Neurology, N. Surgery Dr Maisie Fus over the phone - 06/28/19  Procedures  :    LP 06/30/19  MRI Brain - C Spine -   1. 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 2. Status post ACDF at C5-C6 and C6-C7. Resolved spinal stenosis at C6-C7, and regressed although not resolved abnormal spinal cord signal and enhancement centered at that level. 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 3. Mild cervical spinal stenosis elsewhere, including some residual cord mass effect  related to C5 endplate spurring, but no other abnormal cord signal is identified. 4. Degenerative neural foraminal stenosis, up to moderate at the left C5 and C8 nerve levels, and severe at the left C6 nerve level.   No acute intracranial abnormality and largely unremarkable for age MRI appearance of the brain.   DVT Prophylaxis  :   Heparin    Lab Results  Component Value Date   PLT 292 07/03/2019    Diet :  Diet Order            Diet Heart Room service appropriate? Yes; Fluid consistency: Thin  Diet effective now               Inpatient Medications  Scheduled Meds:  amLODipine  10 mg Oral Daily   folic acid  1 mg Oral Daily   gabapentin  300 mg Oral TID   heparin injection (subcutaneous)  5,000 Units Subcutaneous Q8H   hydrALAZINE  100 mg Oral Q8H   metoprolol tartrate  100 mg Oral BID   Continuous Infusions:  Immune Globulin 10% 45 g (07/01/19 2035)   lactated ringers 100 mL/hr at 07/03/19 0751   thiamine injection 500 mg (07/03/19 0920)   PRN Meds:.acetaminophen, diphenhydrAMINE, hydrALAZINE, lip balm, [DISCONTINUED] ondansetron **OR** ondansetron (ZOFRAN) IV, zolpidem  Antibiotics  :   Anti-infectives (From admission, onward)   None        Objective:   Vitals:   07/02/19 2036 07/02/19 2051 07/02/19 2111 07/03/19 0900  BP: (!) 124/100 (!) 161/100 (!) 131/97 (!) 131/97  Pulse:      Resp: 18 18 18 18   Temp: 98.6 F (37 C) 98.5 F (36.9 C) 98.8 F (37.1 C) 100.1 F (37.8 C)  TempSrc: Oral Oral Oral Oral  SpO2:    95%  Weight:      Height:        SpO2: 95 %  Wt Readings from Last 3 Encounters:  06/28/19 112.4 kg  06/16/19 122.5 kg  06/09/19 122.2 kg  Intake/Output Summary (Last 24 hours) at 07/03/2019 0944 Last data filed at 07/02/2019 2100 Gross per 24 hour  Intake --  Output 400 ml  Net -400 ml     Physical Exam  Awake Alert, No new F.N deficits, generalized weakness in all 4 extremities with strength 5/5 but somewhat  weaker in the left arm as compared to other limbs but this is chronic according to patient, mild left-sided facial droop   [place].AT,PERRAL Supple Neck,No JVD, No cervical lymphadenopathy appriciated.  Symmetrical Chest wall movement, Good air movement bilaterally, CTAB RRR,No Gallops, Rubs or new Murmurs, No Parasternal Heave +ve B.Sounds, Abd Soft, No tenderness, No organomegaly appriciated, No rebound - guarding or rigidity. No Cyanosis, Clubbing or edema, No new Rash or bruise    Data Review:    Recent Labs  Lab 06/29/19 0533 06/30/19 1022 07/01/19 0227 07/02/19 0216 07/03/19 0153  WBC 7.3 7.0 4.9 5.2 6.7  HGB 12.0* 11.7* 10.9* 11.5* 11.8*  HCT 35.9* 35.6* 32.9* 35.6* 34.8*  PLT 372 408* 400 401* 292  MCV 105.0* 106.6* 105.1* 105.3* 102.4*  MCH 35.1* 35.0* 34.8* 34.0 34.7*  MCHC 33.4 32.9 33.1 32.3 33.9  RDW 13.4 13.5 13.3 13.5 13.5  LYMPHSABS 1.8 1.6 1.2 0.8 0.9  MONOABS 0.6 0.4 0.4 0.5 0.8  EOSABS 0.1 0.1 0.1 0.1 0.0  BASOSABS 0.0 0.0 0.0 0.0 0.0    Recent Labs  Lab 06/27/19 1735 06/27/19 2346 06/28/19 0551 06/28/19 1429 06/29/19 0533 06/30/19 1022 07/01/19 0227 07/02/19 0216 07/03/19 0153  NA   < >  --  137  --  136 135 131* 131* 129*  K   < >  --  2.5*   < > 3.3* 3.9 4.9 4.7 5.2*  CL   < >  --  92*  --  96* 99 97* 98 97*  CO2   < >  --  35*  --  GLUCOSE   < >  --  119*  --  110* 138* 130* 107* 119*  BUN   < >  --  6  --  <5* <5* CREATININE   < >  --  0.79  --  0.77 0.77 0.81 0.80 0.97  CALCIUM   < >  --  8.5*  --  9.1 8.9 9.1 9.1 8.9  AST   < >  --  51*  --  54* 46* 43* 52* 63*  ALT   < >  --  35  --  38 37 33 35 37  ALKPHOS   < >  --  75  --  78 75 66 75 75  BILITOT   < >  --  1.2  --  0.4 0.7 1.0 0.7 0.7  ALBUMIN   < >  --  3.0*  --  3.1* 3.0* 2.7* 2.8* 2.6*  MG   < >  --   --   --  1.8 1.8 1.7 1.8 1.8  INR  --   --   --   --   --  0.8  --   --   --   TSH  --  1.777 1.501  --   --   --   --   --   --   BNP  --   --   --   --   48.2  --   --   --   --    < > =  values in this interval not displayed.    Recent Labs  Lab 06/28/19 0042 06/29/19 0533  BNP  --  48.2  SARSCOV2NAA NEGATIVE  --     ------------------------------------------------------------------------------------------------------------------ No results for input(s): CHOL, HDL, LDLCALC, TRIG, CHOLHDL, LDLDIRECT in the last 72 hours.  Lab Results  Component Value Date   HGBA1C 6.1 08/07/2017   ------------------------------------------------------------------------------------------------------------------ No results for input(s): TSH, T4TOTAL, T3FREE, THYROIDAB in the last 72 hours.  Invalid input(s): FREET3 ------------------------------------------------------------------------------------------------------------------ No results for input(s): VITAMINB12, FOLATE, FERRITIN, TIBC, IRON, RETICCTPCT in the last 72 hours.  Coagulation profile Recent Labs  Lab 06/30/19 1022  INR 0.8    No results for input(s): DDIMER in the last 72 hours.  Cardiac Enzymes No results for input(s): CKMB, TROPONINI, MYOGLOBIN in the last 168 hours.  Invalid input(s): CK ------------------------------------------------------------------------------------------------------------------    Component Value Date/Time   BNP 48.2 06/29/2019 0533    Micro Results Recent Results (from the past 240 hour(s))  SARS CORONAVIRUS 2 (TAT 6-24 HRS) Nasopharyngeal Nasopharyngeal Swab     Status: None   Collection Time: 06/28/19 12:42 AM   Specimen: Nasopharyngeal Swab  Result Value Ref Range Status   SARS Coronavirus 2 NEGATIVE NEGATIVE Final    Comment: (NOTE) SARS-CoV-2 target nucleic acids are NOT DETECTED. The SARS-CoV-2 RNA is generally detectable in upper and lower respiratory specimens during the acute phase of infection. Negative results do not preclude SARS-CoV-2 infection, do not rule out co-infections with other pathogens, and should not be used as  the sole basis for treatment or other patient management decisions. Negative results must be combined with clinical observations, patient history, and epidemiological information. The expected result is Negative. Fact Sheet for Patients: HairSlick.no Fact Sheet for Healthcare Providers: quierodirigir.com This test is not yet approved or cleared by the [place] FDA and  has been authorized for detection and/or diagnosis of SARS-CoV-2 by FDA under an Emergency Use Authorization (EUA). This EUA will remain  in effect (meaning this test can be used) for the duration of the COVID-19 declaration under Section 56 4(b)(1) of the Act, 21 U.S.C. section 360bbb-3(b)(1), unless the authorization is terminated or revoked sooner. Performed at [facility], [address], [place], [place] [address]     Radiology Reports MR Brain W and Wo Contrast  Result Date: 06/27/2019 CLINICAL DATA:  54 year old male with increasing neurologic deficits, involuntary twitches and spasms. Status post cervical spine ACDF on 01/16/2019. EXAM: MRI HEAD WITHOUT AND WITH CONTRAST TECHNIQUE: Multiplanar, multiecho pulse sequences of the brain and surrounding structures were obtained without and with intravenous contrast. CONTRAST:  55mL GADAVIST GADOBUTROL 1 MMOL/ML IV SOLN COMPARISON:  No prior head MRI.  Head CT 10/15/2013. FINDINGS: Brain: Cerebral volume is similar to that in 2015. No restricted diffusion to suggest acute infarction. No midline shift, mass effect, evidence of mass lesion, ventriculomegaly, extra-axial collection or acute intracranial hemorrhage. Cervicomedullary junction and pituitary are within normal limits. Largely normal for age gray and white matter signal, minimal scattered nonspecific cerebral white matter T2 and FLAIR hyperintensity. No cortical encephalomalacia or chronic cerebral blood products. No abnormal enhancement identified. No  dural thickening. Vascular: Major intracranial vascular flow voids are preserved. The major dural venous sinuses are enhancing and appear to be patent. Skull and upper cervical spine: Cervical spine details are reported separately. Visualized bone marrow signal is within normal limits. Sinuses/Orbits: Negative orbits. Paranasal sinuses and mastoids are stable and well pneumatized. Other: Scalp and face soft tissues appear negative. There is a  tiny right nasopharyngeal retention cyst, inconsequential. IMPRESSION: No acute intracranial abnormality and largely unremarkable for age MRI appearance of the brain. Electronically Signed   By: Odessa Fleming M.D.   On: 06/27/2019 22:32   MR Cervical Spine W or Wo Contrast  Result Date: 06/27/2019 CLINICAL DATA:  54 year old male with increasing neurologic deficits, involuntary twitches and spasms. Status post cervical spine ACDF on 01/16/2019. EXAM: MRI CERVICAL SPINE WITHOUT AND WITH CONTRAST TECHNIQUE: Multiplanar and multiecho pulse sequences of the cervical spine, to include the craniocervical junction and cervicothoracic junction, were obtained without and with intravenous contrast. CONTRAST:  78mL GADAVIST GADOBUTROL 1 MMOL/ML IV SOLN COMPARISON:  Preoperative cervical spine MRI with contrast on 01/16/2019. And outside preoperative cervical spine without contrast 01/02/2019, now available on [employer]. FINDINGS: Alignment: Straightening of cervical lordosis, less reversal compared to October. There may be mild residual retrolisthesis of C5 on C6. Vertebrae: Mild hardware susceptibility artifact from C5-C6 and C6-C7 ACDF, new from prior MRI. No marrow edema or evidence of acute osseous abnormality. Background bone marrow signal within normal limits. Cord: The spinal cord remains abnormal at C6-C7, although the extent of abnormal cord T2 signal is regressed compared to 01/02/2019, where the T2 abnormality continued to the C7-T1 level. Patchy abnormal T2 signal is now  confined to the C6-C7 level, and on axial images appears to spare only the most peripheral cord white matter (series 9, image 33). The cord also appears less expanded. And following contrast there is only a faint 5 mm focus of residual enhancement (9 mm and more intensely enhancing previously). Above and below that level cord signal appears to remain normal. No other abnormal intradural enhancement. No dural thickening. Posterior Fossa, vertebral arteries, paraspinal tissues: Cervicomedullary junction is within normal limits. Preserved major vascular flow voids in the neck. Negative visible neck soft tissues. Disc levels: There is a congenital degree of cervical spinal canal narrowing related to short pedicles, with superimposed degenerative changes: C2-C3: Mild disc bulge and endplate spurring plus ligament flavum hypertrophy. Mild spinal stenosis but no cord mass effect (series 10, image 6). C3-C4: Mild disc bulge and ligament flavum hypertrophy. Mild spinal stenosis but no cord mass effect. C4-C5: Circumferential disc bulge and mild endplate spurring. Less ligament flavum hypertrophy here. Mild facet hypertrophy. Mild spinal stenosis. No cord mass effect. Mild to moderate C5 foraminal stenosis greater on the left. C5-C6: Interval ACDF. But there does appear to be residual spinal stenosis with mild cord mass effect at the C5 inferior endplate level (series 11, image 27). No cord signal abnormality. Endplate spurring results in severe left and mild right C6 foraminal stenosis (same image). C6-C7: Interval ACDF. Ligament flavum hypertrophy but no significant spinal stenosis. Foraminal patency also appears improved, mild residual stenosis on the right. C7-T1: Mostly foraminal disc bulge and endplate spurring. No spinal stenosis. Moderate left and mild right C8 foraminal stenosis. No upper thoracic spinal stenosis. IMPRESSION: 1. 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 2. Status post ACDF  at C5-C6 and C6-C7. Resolved spinal stenosis at C6-C7, and regressed although not resolved abnormal spinal cord signal and enhancement centered at that level. 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 3. Mild cervical spinal stenosis elsewhere, including some residual cord mass effect related to C5 endplate spurring, but no other abnormal cord signal is identified. 4. Degenerative neural foraminal stenosis, up to moderate at the left C5 and C8 nerve levels, and severe at the left C6 nerve level. Preliminary results of this  exam were discussed by telephone with Dr. Mali Sheldon in the ED. Electronically Signed   By: Genevie Ann M.D.   On: 06/27/2019 22:47   MR THORACIC SPINE W WO CONTRAST  Result Date: 06/30/2019 CLINICAL DATA:  A shin initial evaluation for 1 month history of progressive weakness. EXAM: MRI THORACIC WITHOUT AND WITH CONTRAST TECHNIQUE: Multiplanar and multiecho pulse sequences of the thoracic spine were obtained without and with intravenous contrast. CONTRAST:  37mL GADAVIST GADOBUTROL 1 MMOL/ML IV SOLN COMPARISON:  Prior MRI from 01/02/2019. FINDINGS: MRI THORACIC SPINE FINDINGS Alignment: Examination moderately degraded by motion artifact. Mild dextroscoliosis. Alignment otherwise normal with preservation of the normal thoracic kyphosis. No listhesis. Vertebrae: Vertebral body height maintained without evidence for acute or chronic fracture. Bone marrow signal intensity within normal limits. Few scattered subcentimeter benign hemangiomata noted. No worrisome osseous lesions. No abnormal marrow edema or enhancement. Cord: No definite cord signal abnormality seen on this motion degraded exam. Cord caliber and morphology within normal limits. No appreciable abnormal enhancement. Paraspinal and other soft tissues: Paraspinous soft tissues demonstrate no acute finding. Partially visualized lungs are clear. Exophytic T2 hyperintense simple cyst partially  visualized extending from the posterior right kidney. Visualized visceral structures otherwise unremarkable. Disc levels: T1-2:  Unremarkable. T2-3: Negative interspace. Left-sided facet hypertrophy. No spinal stenosis. Mild left foraminal narrowing. T3-4: Diffuse disc bulge with superimposed left foraminal disc protrusion (series 19, image 8). Right greater than left facet hypertrophy. Resultant mild spinal stenosis without cord deformity. Mild left foraminal narrowing. T4-5: Mild disc bulge. Posterior element hypertrophy. No significant spinal stenosis. Foramina remain patent. T5-6: Tiny right paracentral disc protrusion (series 19, image 14). Posterior element hypertrophy. Mild prominence of the dorsal epidural fat. No significant spinal stenosis. Foramina remain patent. T6-7: Diffuse disc bulge. Superimposed small left paracentral disc protrusion mildly indents the left ventral thecal sac (series 19, image 17). Posterior element hypertrophy. Prominence of the dorsal epidural fat. No significant stenosis. T7-8: Prominence of the dorsal epidural fat with mild facet hypertrophy. Otherwise negative. No stenosis. T8-9: Minimal disc bulge. Posterior element hypertrophy with prominence of the dorsal epidural fat. No stenosis. T9-10: Mild diffuse disc bulge. Moderate bilateral facet hypertrophy. Resultant mild spinal stenosis. Moderate left with mild right foraminal narrowing. T10-11: Diffuse disc bulge. Right greater than left facet hypertrophy. Resultant mild to moderate spinal stenosis (series 19, image 29). Mild left with moderate right foraminal narrowing. T11-12: Minimal disc bulge. Bilateral facet hypertrophy. No significant stenosis. T12-L1: Mild facet hypertrophy. Otherwise unremarkable. No stenosis. IMPRESSION: 1. Technically limited exam due to motion artifact. 2. Grossly normal MRI appearance of the thoracic cord with no cord signal abnormality identified. No acute abnormality or findings to explain  patient's symptoms identified. 3. Multilevel degenerative disc bulging with facet hypertrophy as above. Resultant mild to moderate spinal stenosis at T9-10 and T10-11. Electronically Signed   By: Jeannine Boga M.D.   On: 06/30/2019 05:23   MR Lumbar Spine W Wo Contrast  Result Date: 06/27/2019 CLINICAL DATA:  54 year old male with increasing neurologic deficits, involuntary twitches and spasms. Status post cervical spine ACDF on 01/16/2019. EXAM: MRI LUMBAR SPINE WITHOUT AND WITH CONTRAST TECHNIQUE: Multiplanar and multiecho pulse sequences of the lumbar spine were obtained without and with intravenous contrast. CONTRAST:  67mL GADAVIST GADOBUTROL 1 MMOL/ML IV SOLN in conjunction with contrast enhanced imaging of the head and cervical spine reported separately. COMPARISON:  [facility] Lumbar MRIs 04/19/2019, 12/07/2018 FINDINGS: Segmentation:  Normal. Alignment: Stable degenerative appearing retrolisthesis at  L5-S1, straightening of lumbar lordosis elsewhere. There is also mild chronic retrolisthesis of L2 on L3. Vertebrae: No marrow edema or evidence of acute osseous abnormality. Visualized bone marrow signal is within normal limits. Intact visible sacrum and SI joints. Conus medullaris and cauda equina: Conus extends to the T12-L1 level. No lower spinal cord or conus signal abnormality. No abnormal intradural enhancement. No dural thickening. Paraspinal and other soft tissues: Stable and negative; benign appearing right renal midpole cyst. Disc levels: Since 04/19/2019 lower thoracic and lumbar thecal sac patency appears mildly improved above L4-L5 due to regression of epidural lipomatosis. At L2-L3 circumferential disc bulge with small posterior annular fissure and mild posterior element hypertrophy persist. Mild residual spinal stenosis at that level, improved from January. L4-L5: Persistent mild to moderate spinal stenosis in part due to epidural lipomatosis. Circumferential disc  bulge, progressed right foraminal annular fissure since January (series 8 image 28). Moderate facet hypertrophy with degenerative facet joint fluid. No convincing lateral recess stenosis. Moderate right L4 foraminal stenosis. L5-S1: Continued epidural lipomatosis. Chronic retrolisthesis and disc space loss with circumferential disc osteophyte complex and moderate posterior element hypertrophy. Stable spinal and mild lateral recess stenosis. Stable moderate to severe left and moderate right L5 foraminal stenosis. IMPRESSION: 1. Lumbar thecal sac patency has improved since the January MRI at all levels above L4-L5, due to regressed epidural lipomatosis. 2. Chronic disc degeneration at L4-L5 has progressed in the form of new right foraminal annular fissure. Moderate right L4 foraminal stenosis. Mild to moderate multifactorial spinal stenosis is stable. 3. Advanced chronic L5-S1 degeneration is stable with mild spinal and lateral recess stenosis, and moderate to severe L5 foraminal stenosis greater on the left. Electronically Signed   By: Odessa Fleming M.D.   On: 06/27/2019 22:54    Time Spent in minutes  30   Susa Raring M.D on 07/03/2019 at 9:44 AM  To page go to [email or web address] - password [facility]

## 2019-07-03 NOTE — Progress Notes (Signed)
Notified on call practitioner of temp 100.7, congested cough, and urinalysis results.

## 2019-07-03 NOTE — Progress Notes (Signed)
Inpatient Rehabilitation-Admissions Coordinator   [facility] has initiated insurance authorization process for possible admit in the next few days. Will follow up once there has been a determination. If approved, plan for admit once off IVIG treatment course.   Cheri Rous, OTR/L  Rehab Admissions Coordinator  [phone] 07/03/2019 1:28 PM

## 2019-07-04 ENCOUNTER — Other Ambulatory Visit (HOSPITAL_COMMUNITY): Payer: Self-pay | Admitting: Physical Medicine and Rehabilitation

## 2019-07-04 ENCOUNTER — Inpatient Hospital Stay (HOSPITAL_COMMUNITY): Payer: BC Managed Care – PPO

## 2019-07-04 DIAGNOSIS — R531 Weakness: Secondary | ICD-10-CM | POA: Diagnosis not present

## 2019-07-04 DIAGNOSIS — I10 Essential (primary) hypertension: Secondary | ICD-10-CM | POA: Diagnosis not present

## 2019-07-04 DIAGNOSIS — R05 Cough: Secondary | ICD-10-CM | POA: Diagnosis not present

## 2019-07-04 DIAGNOSIS — R0602 Shortness of breath: Secondary | ICD-10-CM | POA: Diagnosis not present

## 2019-07-04 LAB — CBC WITH DIFFERENTIAL/PLATELET
Abs Immature Granulocytes: 0.04 10*3/uL (ref 0.00–0.07)
Basophils Absolute: 0 10*3/uL (ref 0.0–0.1)
Basophils Relative: 0 %
Eosinophils Absolute: 0 10*3/uL (ref 0.0–0.5)
Eosinophils Relative: 0 %
HCT: 31.2 % — ABNORMAL LOW (ref 39.0–52.0)
Hemoglobin: 10.6 g/dL — ABNORMAL LOW (ref 13.0–17.0)
Immature Granulocytes: 1 %
Lymphocytes Relative: 16 %
Lymphs Abs: 1.4 10*3/uL (ref 0.7–4.0)
MCH: 35.7 pg — ABNORMAL HIGH (ref 26.0–34.0)
MCHC: 34 g/dL (ref 30.0–36.0)
MCV: 105.1 fL — ABNORMAL HIGH (ref 80.0–100.0)
Monocytes Absolute: 1.4 10*3/uL — ABNORMAL HIGH (ref 0.1–1.0)
Monocytes Relative: 16 %
Neutro Abs: 6 10*3/uL (ref 1.7–7.7)
Neutrophils Relative %: 67 %
Platelets: 315 10*3/uL (ref 150–400)
RBC: 2.97 MIL/uL — ABNORMAL LOW (ref 4.22–5.81)
RDW: 13.5 % (ref 11.5–15.5)
WBC: 8.8 10*3/uL (ref 4.0–10.5)
nRBC: 0 % (ref 0.0–0.2)

## 2019-07-04 LAB — COMPREHENSIVE METABOLIC PANEL WITH GFR
ALT: 28 U/L (ref 0–44)
AST: 44 U/L — ABNORMAL HIGH (ref 15–41)
Albumin: 2.4 g/dL — ABNORMAL LOW (ref 3.5–5.0)
Alkaline Phosphatase: 64 U/L (ref 38–126)
Anion gap: 7 (ref 5–15)
BUN: 9 mg/dL (ref 6–20)
CO2: 26 mmol/L (ref 22–32)
Calcium: 8.6 mg/dL — ABNORMAL LOW (ref 8.9–10.3)
Chloride: 96 mmol/L — ABNORMAL LOW (ref 98–111)
Creatinine, Ser: 1 mg/dL (ref 0.61–1.24)
GFR calc Af Amer: >60 mL/min
GFR calc non Af Amer: >60 mL/min
Glucose, Bld: 119 mg/dL — ABNORMAL HIGH (ref 70–99)
Potassium: 4.2 mmol/L (ref 3.5–5.1)
Sodium: 129 mmol/L — ABNORMAL LOW (ref 135–145)
Total Bilirubin: 0.6 mg/dL (ref 0.3–1.2)
Total Protein: 8.6 g/dL — ABNORMAL HIGH (ref 6.5–8.1)

## 2019-07-04 LAB — MAGNESIUM: Magnesium: 1.7 mg/dL (ref 1.7–2.4)

## 2019-07-04 LAB — PROCALCITONIN: Procalcitonin: 0.25 ng/mL

## 2019-07-04 MED ORDER — TRAMADOL HCL 50 MG PO TABS
50.0000 mg | ORAL_TABLET | Freq: Once | ORAL | Status: AC
  Administered 2019-07-04: 50 mg via ORAL
  Filled 2019-07-04: qty 1

## 2019-07-04 MED ORDER — TOLVAPTAN 15 MG PO TABS
15.0000 mg | ORAL_TABLET | ORAL | Status: DC
  Administered 2019-07-04: 15 mg via ORAL
  Filled 2019-07-04 (×2): qty 1

## 2019-07-04 NOTE — Progress Notes (Signed)
Inpatient Rehabilitation-Admissions Coordinator   I have received insurance approval and medical clearance from Dr. Thedore Mins for admit to [facility] Saturday, 07/05/19.  PM&R MD, Dr. Berline Chough will see patient Sauturday morning to confirm patient readiness. Pt's RN can call [facility] nursing station ([phone]) by noon to get report.  Reviewed consent form and insurance benefit letter with pt bedside. He wants to proceed. All questions answered. TOC and RN aware of plan for admit tomorrow.   Cheri Rous, OTR/L  Rehab Admissions Coordinator  [phone] 07/04/2019 2:03 PM

## 2019-07-04 NOTE — Progress Notes (Signed)
PT Cancellation Note  Patient Details Name: Jorge Weaver. MRN: [id number] DOB: Jul 26, 1965   Cancelled Treatment:    Reason Eval/Treat Not Completed: Patient declined, no reason specified.  Just not a good morning. 07/04/2019  Jacinto Halim., PT Acute Rehabilitation Services [phone]  (pager) [phone]  (office)   Eliseo Gum [place] 07/04/2019, 4:36 PM

## 2019-07-04 NOTE — H&P (Signed)
Physical Medicine and Rehabilitation Admission H&P    Chief Complaint  Patient presents with  . Functional decline due to GBS    HPI: Jorge Weaver is a 54 year old male with history of HTN, cervical spondylosis with myelopathy s/p decompression 12/2018, ETOH use who was admitted on 06/29/19 with increasing numbness in BUE/BLE progressing to umbilicus and face, recurrent falls and hesitancy. He was severely hypokalemic- K-2.2 at admission which was supplemented. MRI brain was negative for acute changes. MRI cervical spine showed ACDF C5-C7 with resolved stenosis and moderate neural foraminal stenosis C5 and C8 nerve roots, severe at L-C6 nerve level. MRI lumbar spine showed progression fo DD L4/L5 with new right foraminal annular fissure and moderate right L4 foraminal stenosis, advanced L5/S1 degeneration with moderate to severe L5 foraminal stenosis.  Dr. Maisie Fus consulted and felt that surgical intervention not needed.   Dr. Laurence Slate consulted for input and recommended full work up as well as IV thiamine due to history of alcohol abuse with evidence of macrocytic anemia and to rule out GBS as hypokalemia had improved without improvement in symptoms.  Labs for heavy metals and nutritional deficiency negative. RPR/HIV negative. LP done revealing albuminocytologic dissociation and was treated with IVIG X 5 days--completed on 4/16.   Blood cultures done 4/15 due to fevers and pending.  He developed hyponatremia with drop in Na-125 this weekend --was treated briefly with lasix, tolvaptan and IVF with improvement in sodium levels. Patient continues to be limited by neuropathy with weakness affecting functional status and CIR recommended due to functional decline.    Review of Systems  Constitutional: Negative for chills and fever.  HENT: Negative for hearing loss and tinnitus.        Lower lip sore due to tendency to bite it.   Eyes: Negative for blurred vision and double vision.  Respiratory:  Negative for cough and stridor.   Cardiovascular: Negative for chest pain, claudication and leg swelling.  Gastrointestinal: Positive for constipation. Negative for heartburn and nausea.  Genitourinary: Negative for dysuria and urgency.  Musculoskeletal: Positive for joint pain. Negative for myalgias and neck pain.  Neurological: Positive for sensory change (numbness is getting better), speech change and weakness. Negative for dizziness and headaches.  Psychiatric/Behavioral: The patient is not nervous/anxious and does not have insomnia.       Past Medical History:  Diagnosis Date  . Anemia    low iron  . Asthma    as a child  . Cervical spondylosis with myelopathy   . Fatty liver   . GERD (gastroesophageal reflux disease)   . Heart murmur    when he was younger   . Hypertension   . Pneumonia     Past Surgical History:  Procedure Laterality Date  . ANTERIOR CERVICAL DECOMP/DISCECTOMY FUSION N/A 01/16/2019   Procedure: Cervical six corpectomy with Cervical five-seven fusion and plating;  Surgeon: Coletta Memos, MD;  Location: [facility] OR;  Service: Neurosurgery;  Laterality: N/A;  . NO PAST SURGERIES    . WISDOM TOOTH EXTRACTION    . WRIST SURGERY Left 2014   fx    Family History  Problem Relation Age of Onset  . Hypertension Mother   . Heart attack Father   . Colon cancer Sister   . Esophageal cancer Neg Hx   . Rectal cancer Neg Hx   . Stomach cancer Neg Hx     Social History:  Married. Used to work as a Teaching laboratory technician.  Mainreports that he  has never smoked. He has never used smokeless tobacco. He reports current alcohol use of about 3.0 standard drinks of alcohol per week. He reports that he does not use drugs.    Allergies: No Known Allergies    Medications Prior to Admission  Medication Sig Dispense Refill  . acetaminophen (TYLENOL) 500 MG tablet Take 1,000 mg by mouth See admin instructions. Take 1,000 mg by mouth two to five times a day as needed for pain     . amLODipine (NORVASC) 10 MG tablet Take 1 tablet (10 mg total) by mouth daily. 90 tablet 3  . famotidine (PEPCID) 20 MG tablet Take 1 tablet (20 mg total) by mouth 2 (two) times daily. (Patient not taking: Reported on 06/27/2019) 60 tablet 2  . levocetirizine (XYZAL) 5 MG tablet Take 1 tablet (5 mg total) by mouth every evening. (Patient not taking: Reported on 06/27/2019) 30 tablet 2  . metoprolol succinate (TOPROL-XL) 50 MG 24 hr tablet Take 1 tablet (50 mg total) by mouth daily. Take with or immediately following a meal. (Patient not taking: Reported on 06/27/2019) 90 tablet 3  . potassium chloride (KLOR-CON 10) 10 MEQ tablet Take 2 tablets (20 mEq total) by mouth daily. (Patient not taking: Reported on 06/27/2019) 180 tablet 0  . tamsulosin (FLOMAX) 0.4 MG CAPS capsule Take 0.4 mg by mouth.      Drug Regimen Review  Drug regimen was reviewed and remains appropriate with no significant issues identified  Home: Home Living Family/patient expects to be discharged to:: Private residence Living Arrangements: Spouse/significant other Available Help at Discharge: Family Type of Home: House Home Access: Stairs to enter [employer] of Steps: slight step to get onto porch Entrance Stairs-Rails: None Home Layout: (12 steps inside house with no rails) Bathroom Shower/Tub: Engineer, manufacturing systems: Standard Bathroom Accessibility: Yes Home Equipment: Environmental consultant - 2 wheels Additional Comments: Per wife, pt tries not to use walker or cane  Lives With: Spouse   Functional History: Prior Function Level of Independence: Independent Comments: working doing Mudlogger work  Functional Status:  Mobility: Bed Mobility Overal bed mobility: Needs Assistance Bed Mobility: Rolling, Sidelying to Sit Rolling: Min assist Sidelying to sit: Min assist Supine to sit: Min assist General bed mobility comments: assisted pt to come forward up via R elbow. Transfers Overall transfer  level: Needs assistance Equipment used: Rolling walker (2 wheeled) Transfers: Sit to/from Stand, [employer] Transfers Sit to Stand: Mod assist, +2 physical assistance Stand pivot transfers: Mod assist, +2 physical assistance General transfer comment: Cues needed to use hands to push off surface leaving even though painful.  Pt with L knee buckle when up requring mod assist x2 to recover. Ambulation/Gait Ambulation/Gait assistance: Mod assist, +2 physical assistance, +2 safety/equipment Gait Distance (Feet): 15 Feet(x2) Assistive device: Rolling walker (2 wheeled) Gait Pattern/deviations: Step-to pattern, Step-through pattern, Decreased step length - left, Decreased step length - right, Decreased stance time - left, Decreased stride length General Gait Details: uncoordinated steps bil, but pt able to keep and appropriate BOS, keep each knee extended during stance, though unable to unlock knee for coordinated heel/toe pattern. Gait velocity interpretation: <1.31 ft/sec, indicative of household ambulator    ADL: ADL Overall ADL's : Needs assistance/impaired Eating/Feeding: Set up, Sitting Eating/Feeding Details (indicate cue type and reason): Pt feeding self finger foods without assist and drinking from large mug with handle without assist. pt continues to require assist cutting food.  Ataxia makes keeping food on fork difficult. Grooming: Oral care, Wash/dry  face, Minimal assistance, Sitting Grooming Details (indicate cue type and reason): opening and closing small containers is difficult.  Discussed other options such as large toothpaste pump, larger toothbrush etc.   Upper Body Bathing: Minimal assistance, Sitting Lower Body Bathing: Maximal assistance, +2 for physical assistance, Sit to/from stand Upper Body Dressing : Minimal assistance, Sitting Upper Body Dressing Details (indicate cue type and reason): donning new gown Lower Body Dressing: Maximal assistance, +2 for physical  assistance, Sit to/from stand Lower Body Dressing Details (indicate cue type and reason): Pt unable to sit in figure 4 to cross legs to donn socks and reaching forward was difficult today.  pt transferred sit to stand with mod assist x2 .  Once up pt cannot let go of walker on one side to assist with adls in standing.  Toilet Transfer: Minimal assistance, +2 for physical assistance, RW Toilet Transfer Details (indicate cue type and reason): Pt did have L knee buckle during transfer.  Assist provided from therapist to remain standing. pt requring cues to [facility] to [facility] before sitting. Toileting- Clothing Manipulation and Hygiene: Moderate assistance, Sit to/from stand Functional mobility during ADLs: Moderate assistance, +2 for physical assistance General ADL Comments: Pt limited with adls in standing due to poor leg strength and ataxia and adls limited in sitting due to UE burning sensation and poor coordination.  Cognition: Cognition Overall Cognitive Status: Within Functional Limits for tasks assessed Orientation Level: Oriented X4 Cognition Arousal/Alertness: Awake/alert Behavior During Therapy: WFL for tasks assessed/performed Overall Cognitive Status: Within Functional Limits for tasks assessed General Comments: Pt very frustrated with current status. Works hard. Motivated.   Blood pressure 129/86, pulse (!) 108, temperature 98.5 F (36.9 C), temperature source Oral, resp. rate 16, height 5\' 11"  (1.803 m), weight 112.4 kg, SpO2 97 %.   General: Alert and oriented x 3, No apparent distress HEENT: Head is normocephalic, atraumatic, PERRLA, EOMI, sclera anicteric, oral mucosa pink and moist, dentition intact, ext ear canals clear,  Neck: Supple without JVD or lymphadenopathy Heart: Reg rate and rhythm. No murmurs rubs or gallops Chest: CTA bilaterally without wheezes, rales, or rhonchi; no distress Abdomen: Soft, non-tender, non-distended, bowel sounds positive. Extremities: No  clubbing, cyanosis, or edema. Pulses are 2+ Skin: Small scabs left lower lip.   Neuro/Musculoskeletal: Right facial weakness with severe dysarthria. Has problems moving tongue and closing mouth. Reporting severe perioral numbness with soreness lower lip.  4+/5 strength throughout. Ataxia. AOx3. Able to spell WORLD backwards on 2nd try. Intact repetition. 1/3 delayed recall, 2/3 with cues Psych: Pt's affect is appropriate. Pt is cooperative   Results for orders placed or performed during the hospital encounter of 06/27/19 (from the past 48 hour(s))  Folate     Status: None   Collection Time: 07/05/19  2:11 PM  Result Value Ref Range   Folate 22.1 >5.9 ng/mL    Comment: Performed at [facility], [address] [place], [place] [address]  Basic metabolic panel     Status: Abnormal   Collection Time: 07/05/19  8:54 PM  Result Value Ref Range   Sodium 125 (L) 135 - 145 mmol/L   Potassium 4.2 3.5 - 5.1 mmol/L   Chloride 96 (L) 98 - 111 mmol/L   CO2 23 22 - 32 mmol/L   Glucose, Bld 125 (H) 70 - 99 mg/dL    Comment: Glucose reference range applies only to samples taken after fasting for at least 8 hours.   BUN 11 6 - 20 mg/dL  Creatinine, Ser 0.72 0.61 - 1.24 mg/dL   Calcium 8.5 (L) 8.9 - 10.3 mg/dL   GFR calc non Af Amer >60 >60 mL/min   GFR calc Af Amer >60 >60 mL/min   Anion gap 6 5 - 15    Comment: Performed at [facility], [address] [place], [place] [address]  Procalcitonin     Status: None   Collection Time: 07/06/19  2:36 AM  Result Value Ref Range   Procalcitonin 0.41 ng/mL    Comment:        Interpretation: PCT (Procalcitonin) <= 0.5 ng/mL: Systemic infection (sepsis) is not likely. Local bacterial infection is possible. (NOTE)       Sepsis PCT Algorithm           Lower Respiratory Tract                                      Infection PCT Algorithm    ----------------------------     ----------------------------         PCT < 0.25 ng/mL                 PCT < 0.10 ng/mL         Strongly encourage             Strongly discourage   discontinuation of antibiotics    initiation of antibiotics    ----------------------------     -----------------------------       PCT 0.25 - 0.50 ng/mL            PCT 0.10 - 0.25 ng/mL               OR       >80% decrease in PCT            Discourage initiation of                                            antibiotics      Encourage discontinuation           of antibiotics    ----------------------------     -----------------------------         PCT >= 0.50 ng/mL              PCT 0.26 - 0.50 ng/mL               AND        <80% decrease in PCT             Encourage initiation of                                             antibiotics       Encourage continuation           of antibiotics    ----------------------------     -----------------------------        PCT >= 0.50 ng/mL                  PCT > 0.50 ng/mL               AND  increase in PCT                  Strongly encourage                                      initiation of antibiotics    Strongly encourage escalation           of antibiotics                                     -----------------------------                                           PCT <= 0.25 ng/mL                                                 OR                                        > 80% decrease in PCT                                     Discontinue / Do not initiate                                             antibiotics Performed at [facility], [address] [place], [place] [address]   Basic metabolic panel     Status: Abnormal   Collection Time: 07/06/19  2:36 AM  Result Value Ref Range   Sodium 126 (L) 135 - 145 mmol/L   Potassium 4.0 3.5 - 5.1 mmol/L   Chloride 96 (L) 98 - 111 mmol/L   CO2 24 22 - 32 mmol/L   Glucose, Bld 104 (H) 70 - 99 mg/dL    Comment: Glucose reference range applies only to samples taken after fasting for at least 8 hours.    BUN 9 6 - 20 mg/dL   Creatinine, Ser 6.43 0.61 - 1.24 mg/dL   Calcium 8.5 (L) 8.9 - 10.3 mg/dL   GFR calc non Af Amer >60 >60 mL/min   GFR calc Af Amer >60 >60 mL/min   Anion gap 6 5 - 15    Comment: Performed at [facility], [address], [place], [place] [address]  Magnesium     Status: None   Collection Time: 07/06/19  2:36 AM  Result Value Ref Range   Magnesium 1.8 1.7 - 2.4 mg/dL    Comment: Performed at [facility], [address] [place], [place] [address]  Basic metabolic panel     Status: Abnormal   Collection Time: 07/06/19  1:50 PM  Result Value Ref Range   Sodium 128 (L) 135 - 145 mmol/L   Potassium 4.2 3.5 - 5.1 mmol/L  Chloride 97 (L) 98 - 111 mmol/L   CO2 24 22 - 32 mmol/L   Glucose, Bld 117 (H) 70 - 99 mg/dL    Comment: Glucose reference range applies only to samples taken after fasting for at least 8 hours.   BUN 6 6 - 20 mg/dL   Creatinine, Ser 0.69 0.61 - 1.24 mg/dL   Calcium 8.7 (L) 8.9 - 10.3 mg/dL   GFR calc non Af Amer >60 >60 mL/min   GFR calc Af Amer >60 >60 mL/min   Anion gap 7 5 - 15    Comment: Performed at [facility] [address], [place], [place] [address]  Basic metabolic panel     Status: Abnormal   Collection Time: 07/07/19  2:17 AM  Result Value Ref Range   Sodium 129 (L) 135 - 145 mmol/L   Potassium 4.0 3.5 - 5.1 mmol/L   Chloride 98 98 - 111 mmol/L   CO2 23 22 - 32 mmol/L   Glucose, Bld 122 (H) 70 - 99 mg/dL    Comment: Glucose reference range applies only to samples taken after fasting for at least 8 hours.   BUN 6 6 - 20 mg/dL   Creatinine, Ser 0.72 0.61 - 1.24 mg/dL   Calcium 8.6 (L) 8.9 - 10.3 mg/dL   GFR calc non Af Amer >60 >60 mL/min   GFR calc Af Amer >60 >60 mL/min   Anion gap 8 5 - 15    Comment: Performed at [facility] [address] [place], [place] [address]  Magnesium     Status: None   Collection Time: 07/07/19  2:17 AM  Result Value Ref Range   Magnesium 1.7 1.7 - 2.4 mg/dL     Comment: Performed at [facility] [address] [place], [place] [address]  Brain natriuretic peptide     Status: None   Collection Time: 07/07/19  2:17 AM  Result Value Ref Range   B Natriuretic Peptide 27.8 0.0 - 100.0 pg/mL    Comment: Performed at [facility] [address] [place], [place] [address]   No results found.  Medical Problem List and Plan: 1.  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 2.  Antithrombotics: -DVT/anticoagulation:  Pharmaceutical: Heparin  -antiplatelet therapy: N/a 3. Pain Management: Tylenol prn.  4. Mood: LCSW to follow for evaluation and support.   -antipsychotic agents: N/a 5. Neuropsych: This patient is capable of making decisions on his own behalf. 6. Skin/Wound Care: Routine pressure relief measures.  7. Fluids/Electrolytes/Nutrition: Monitor I/O. Check lytes in am--off fluid restriction now 8. HTN: Monitor BP tid--continue Hydralazine, metoprolol and amlodipine. Has been labile.  9. Hyponatremia: Likely due to GBS. Has received Tolvaptan, fluid restriction, and salt tablets. Slowly trending upward. 129 on 4/19.  10. H/o alcohol use: Continue Thiamine and Folic acid. boderline low Mg 1.7-1.8 range. Will add supplement.  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  12. Abnormal LFTs: Recheck in am.  13. Bladder incontinence:  14. Dysarthria: Severe this am with increase in right facial weakness. Discussed with Dr. Candiss Norse and Etta Quill PA for neuro.  Benadryl recommended to help with edema. They report that speech does fluctuate on and off. NIF ordered X 2 per and neuro recommends formal consult in am if speech not  improved.  15. Insomnia: Continue Ambien prn   Jacquelynn Cree, PA-C 07/07/2019   I have personally performed a face to face diagnostic evaluation, including,  but not limited to relevant history and physical exam findings, of this patient and developed relevant assessment and plan.  Additionally, I have reviewed and concur with the physician assistant's documentation above.  Sula Soda, MD

## 2019-07-04 NOTE — Progress Notes (Signed)
Subjective: Feels he is improving.   Exam: Vitals:   07/04/19 0312 07/04/19 0704  BP: 128/89 (!) 140/97  Pulse:  98  Resp: 18 16  Temp: 98.7 F (37.1 C) 98.7 F (37.1 C)  SpO2: 96% 96%   Gen: In bed, NAD Resp: non-labored breathing, no acute distress Abd: soft, nt  Neuro: MS: awake, alert,  BW:LSLHT, EOMI Motor: He actually has fairly good effort in all extremities today, 5/5 hip flexion, otherwise 5/5. Sensory: Decreased temperature to the knee bilaterally and to just above the elbow bilaterally. Cerebellar: He continues to have significant ataxia, but I think it is slightly improved from previous days.  Pertinent Labs: Heavy metals-negative Ceruloplasmin-normal RPR-negative MMA-pending [id number] TSH-normal B1-pending  Impression: 54 year old male with acute sensory ataxia/sensation loss associated with albuminocytologic dissociation.  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   Today will be day 5 of IVIG. He does appear to eb responding to treatment.   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  Recommendations: 1) complete IVIG today.  2) neurology will continue to follow  Ritta Slot, MD [facility] [phone]  If 7pm- 7am, please page neurology on call as listed in AMION.

## 2019-07-05 LAB — CBC WITH DIFFERENTIAL/PLATELET
Abs Immature Granulocytes: 0.02 10*3/uL (ref 0.00–0.07)
Basophils Absolute: 0 10*3/uL (ref 0.0–0.1)
Basophils Relative: 0 %
Eosinophils Absolute: 0 10*3/uL (ref 0.0–0.5)
Eosinophils Relative: 0 %
HCT: 31.4 % — ABNORMAL LOW (ref 39.0–52.0)
Hemoglobin: 10.4 g/dL — ABNORMAL LOW (ref 13.0–17.0)
Immature Granulocytes: 0 %
Lymphocytes Relative: 20 %
Lymphs Abs: 1.4 10*3/uL (ref 0.7–4.0)
MCH: 34.8 pg — ABNORMAL HIGH (ref 26.0–34.0)
MCHC: 33.1 g/dL (ref 30.0–36.0)
MCV: 105 fL — ABNORMAL HIGH (ref 80.0–100.0)
Monocytes Absolute: 1.1 10*3/uL — ABNORMAL HIGH (ref 0.1–1.0)
Monocytes Relative: 14 %
Neutro Abs: 4.8 10*3/uL (ref 1.7–7.7)
Neutrophils Relative %: 66 %
Platelets: 309 10*3/uL (ref 150–400)
RBC: 2.99 MIL/uL — ABNORMAL LOW (ref 4.22–5.81)
RDW: 13.4 % (ref 11.5–15.5)
WBC: 7.4 10*3/uL (ref 4.0–10.5)
nRBC: 0 % (ref 0.0–0.2)

## 2019-07-05 LAB — BASIC METABOLIC PANEL
Anion gap: 6 (ref 5–15)
BUN: 11 mg/dL (ref 6–20)
CO2: 23 mmol/L (ref 22–32)
Calcium: 8.5 mg/dL — ABNORMAL LOW (ref 8.9–10.3)
Chloride: 96 mmol/L — ABNORMAL LOW (ref 98–111)
Creatinine, Ser: 0.72 mg/dL (ref 0.61–1.24)
GFR calc Af Amer: >60 mL/min (ref >60)
GFR calc non Af Amer: >60 mL/min (ref >60)
Glucose, Bld: 125 mg/dL — ABNORMAL HIGH (ref 70–99)
Potassium: 4.2 mmol/L (ref 3.5–5.1)
Sodium: 125 mmol/L — ABNORMAL LOW (ref 135–145)

## 2019-07-05 LAB — PROCALCITONIN: Procalcitonin: 0.28 ng/mL

## 2019-07-05 LAB — COMPREHENSIVE METABOLIC PANEL
ALT: 25 U/L (ref 0–44)
AST: 41 U/L (ref 15–41)
Albumin: 2.3 g/dL — ABNORMAL LOW (ref 3.5–5.0)
Alkaline Phosphatase: 59 U/L (ref 38–126)
Anion gap: 8 (ref 5–15)
BUN: 9 mg/dL (ref 6–20)
CO2: 23 mmol/L (ref 22–32)
Calcium: 8.5 mg/dL — ABNORMAL LOW (ref 8.9–10.3)
Chloride: 94 mmol/L — ABNORMAL LOW (ref 98–111)
Creatinine, Ser: 0.78 mg/dL (ref 0.61–1.24)
GFR calc Af Amer: >60 mL/min (ref >60)
GFR calc non Af Amer: >60 mL/min (ref >60)
Glucose, Bld: 93 mg/dL (ref 70–99)
Potassium: 4 mmol/L (ref 3.5–5.1)
Sodium: 125 mmol/L — ABNORMAL LOW (ref 135–145)
Total Bilirubin: 0.6 mg/dL (ref 0.3–1.2)
Total Protein: 9.3 g/dL — ABNORMAL HIGH (ref 6.5–8.1)

## 2019-07-05 LAB — FOLATE: Folate: 22.1 ng/mL (ref >5.9)

## 2019-07-05 LAB — MAGNESIUM: Magnesium: 1.8 mg/dL (ref 1.7–2.4)

## 2019-07-05 LAB — OSMOLALITY: Osmolality: 274 mOsm/kg — ABNORMAL LOW (ref 275–295)

## 2019-07-05 LAB — VITAMIN E
Vitamin E (Alpha Tocopherol): 8 mg/L (ref 7.0–25.1)
Vitamin E(Gamma Tocopherol): 1.2 mg/L (ref 0.5–5.5)

## 2019-07-05 LAB — SODIUM, URINE, RANDOM: Sodium, Ur: 46 mmol/L

## 2019-07-05 LAB — OSMOLALITY, URINE: Osmolality, Ur: 827 mOsm/kg (ref 300–900)

## 2019-07-05 LAB — VITAMIN B1: Vitamin B1 (Thiamine): 135 nmol/L (ref 66.5–200.0)

## 2019-07-05 LAB — URIC ACID: Uric Acid, Serum: 3.2 mg/dL — ABNORMAL LOW (ref 3.7–8.6)

## 2019-07-05 MED ORDER — SODIUM CHLORIDE 1 G PO TABS
1.0000 g | ORAL_TABLET | Freq: Two times a day (BID) | ORAL | Status: DC
  Administered 2019-07-05 – 2019-07-07 (×6): 1 g via ORAL
  Filled 2019-07-05 (×7): qty 1

## 2019-07-05 MED ORDER — SODIUM CHLORIDE 0.9 % IV SOLN: INTRAVENOUS | Status: AC

## 2019-07-05 NOTE — Progress Notes (Signed)
PROGRESS NOTE                                             Late entry                                                                                                                                                                Patient Demographics:    Jorge Weaver, is a 54 y.o. male, DOB - [birth year], ZOX:[id number]  Admit date - 06/27/2019   Admitting Physician Eduard Clos, MD  Outpatient Primary MD for the patient is [name], DO  LOS - 7  Chief Complaint  Patient presents with  . Weakness       Brief Narrative  - 54 y.o. male with history of hypertension previous alcohol abuse which patient states has stopped drinking for almost a month now who has had a surgery in October 2024 cervical 6 corpectomy and C5 7 arthrodesis by Dr. Franky Macho presents to the ER because of increasing tightness of the extremities difficulty to move difficulty walking and increasing numbness of the upper and lower extremities.  Denies any nausea vomiting diarrhea headache chest pain.  Has been having recurrent falls.  In the ER his MRI brain and C-spine were nonacute, this was discussed by the admitting physician with neurologist on-call at night Dr. Wilford Corner, in the morning I discussed his MRI findings with neurosurgeon Dr. Maisie Fus.  No surgical intervention or medical intervention needed according to the specialist.  Incidentally he was found to have life-threatening low severe hypokalemia along with ongoing weakness and admitted to the hospital.   Subjective:   Patient in bed eating breakfast in no distress, denies any headache chest or abdominal pain, tingling numbness and weakness are gradually improving.   Assessment  & Plan :     1.  Generalized subacute weakness worse in the left upper extremity and left side of the face ongoing for several weeks in a patient with history of C-spine fusion surgery.  Case discussed by admitting physician with neurologist  Dr. Wilford Corner and me with neurosurgeon Dr. Maisie Fus.   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  Continue PT OT and discharge to CIR once problem #6 has improved.   2.  Multiple falls at home with lip and tongue  injury during a fall along with avulsed right big toe nail.  Due to muscular weakness from #1 above, supportive care and monitor.  3. HTN - on high doses of Norvasc, Lopressor and hydralazine, 1 dose of IV Lasix on 07/02/2019 and monitor.  4.  History of alcohol abuse.  Counseled to quit.  Placed on folic acid and thiamine.  Last drink 1 month ago.  5.  Hyperkalemia.  Improved after Lasix.  6.  Hyponatremia.  Studies suggestive of SIADH, received fluid restriction, Lasix and Samsca on 07/04/2019 however hyponatremia got worse, now on exam skin turgor seems to have reduced, question if he is now actually dehydrated, urine sodium also trending down although still above 40, will place on free water restriction and give him some normal saline along with salt tablets and monitor.    Family Communication  :  Wife [name] - [phone], 06/28/19, 06/30/19, 07/01/19  Code Status : Full  Disposition Plan  : CIR once sodium improves  Consults  :  Neurology, N. Surgery Dr Maisie Fus over the phone - 06/28/19  Procedures  :    LP 06/30/19  MRI Brain - C Spine -   1. 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 2. Status post ACDF at C5-C6 and C6-C7. Resolved spinal stenosis at C6-C7, and regressed although not resolved abnormal spinal cord signal and enhancement centered at that level. 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 3. Mild cervical spinal stenosis elsewhere, including some residual cord mass effect related to C5 endplate spurring, but no other abnormal cord signal is identified. 4. Degenerative neural foraminal stenosis, up to moderate at the left C5 and C8 nerve levels, and severe at the left C6 nerve level.   No acute intracranial abnormality and largely unremarkable for age MRI appearance of the brain.   DVT Prophylaxis  :   Heparin    Lab Results  Component Value Date   PLT 309 07/05/2019    Diet :  Diet Order            Diet Heart Room service appropriate? Yes; Fluid consistency: Thin  Diet effective now               Inpatient Medications  Scheduled Meds: . amLODipine  10 mg Oral Daily  . folic acid  1 mg Oral Daily  . gabapentin  300 mg Oral TID  . heparin injection (subcutaneous)  5,000 Units Subcutaneous Q8H  . hydrALAZINE  100 mg Oral Q8H  . metoprolol tartrate  100 mg Oral BID  . sodium chloride  1 g Oral BID WC  . thiamine injection  100 mg Intravenous Q24H   Continuous Infusions:  PRN Meds:.acetaminophen, hydrALAZINE, lip balm, [DISCONTINUED] ondansetron **OR** ondansetron (ZOFRAN) IV, zolpidem  Antibiotics  :   Anti-infectives (From admission, onward)   None        Objective:   Vitals:   07/04/19 2324 07/05/19 0142 07/05/19 0548 07/05/19 0748  BP: 113/81 123/84 125/88 128/86  Pulse: 88 88 93 98  Resp: Temp: 99.2 F (37.3 C) 98.7 F (37.1 C) 99.1 F (37.3 C) 99 F (37.2 C)  TempSrc:  Oral Oral   SpO2: 97%   97%  Weight:      Height:        SpO2: 97 %  Wt Readings from Last 3 Encounters:  06/28/19 112.4 kg  06/16/19 122.5 kg  06/09/19 122.2 kg     Intake/Output  Summary (Last 24 hours) at 07/05/2019 0954 Last data filed at 07/04/2019 2300 Gross per 24 hour  Intake 240 ml  Output 1550 ml  Net -1310 ml     Physical Exam  Awake Alert, No new F.N deficits, generalized weakness in all 4 extremities with strength 5/5 but  somewhat weaker in the left arm as compared to other limbs but this is chronic according to patient, mild left-sided facial droop   [place].AT,PERRAL Supple Neck,No JVD, No cervical lymphadenopathy appriciated.  Symmetrical Chest wall movement, Good air movement bilaterally, CTAB RRR,No Gallops, Rubs or new Murmurs, No Parasternal Heave +ve B.Sounds, Abd Soft, No tenderness, No organomegaly appriciated, No rebound - guarding or rigidity. No Cyanosis, Clubbing or edema, No new Rash or bruise     Data Review:    Recent Labs  Lab 07/01/19 0227 07/02/19 0216 07/03/19 0153 07/04/19 0207 07/05/19 0411  WBC 4.9 5.2 6.7 8.8 7.4  HGB 10.9* 11.5* 11.8* 10.6* 10.4*  HCT 32.9* 35.6* 34.8* 31.2* 31.4*  PLT 400 401* 292 315 309  MCV 105.1* 105.3* 102.4* 105.1* 105.0*  MCH 34.8* 34.0 34.7* 35.7* 34.8*  MCHC 33.1 32.3 33.9 34.0 33.1  RDW 13.3 13.5 13.5 13.5 13.4  LYMPHSABS 1.2 0.8 0.9 1.4 1.4  MONOABS 0.4 0.5 0.8 1.4* 1.1*  EOSABS 0.1 0.1 0.0 0.0 0.0  BASOSABS 0.0 0.0 0.0 0.0 0.0    Recent Labs  Lab 06/29/19 0533 06/29/19 0533 06/30/19 1022 06/30/19 1022 07/01/19 0227 07/02/19 0216 07/03/19 0153 07/03/19 2020 07/04/19 0207 07/05/19 0411  NA 136   < > 135   < > 131* 131* 129*  --  129* 125*  K 3.3*   < > 3.9   < > 4.9 4.7 5.2*  --  4.2 4.0  CL 96*   < > 99   < > 97* 98 97*  --  96* 94*  CO2 28   < > 26   < > --  26 23  GLUCOSE 110*   < > 138*   < > 130* 107* 119*  --  119* 93  BUN <5*   < > <5*   < > --  9 9  CREATININE 0.77   < > 0.77   < > 0.81 0.80 0.97  --  1.00 0.78  CALCIUM 9.1   < > 8.9   < > 9.1 9.1 8.9  --  8.6* 8.5*  AST 54*   < > 46*   < > 43* 52* 63*  --  44* 41  ALT 38   < > 37   < > 33 35 37  --  28 25  ALKPHOS 78   < > 75   < > 66 75 75  --  64 59  BILITOT 0.4   < > 0.7   < > 1.0 0.7 0.7  --  0.6 0.6  ALBUMIN 3.1*   < > 3.0*   < > 2.7* 2.8* 2.6*  --  2.4* 2.3*  MG 1.8   < > 1.8   < > 1.7 1.8 1.8  --  1.7 1.8  PROCALCITON  --   --   --   --   --    --   --  0.28 0.25 0.28  INR  --   --  0.8  --   --   --   --   --   --   --  BNP 48.2  --   --   --   --   --   --   --   --   --    < > = values in this interval not displayed.    Recent Labs  Lab 06/29/19 0533 07/03/19 2020 07/04/19 0207 07/05/19 0411  BNP 48.2  --   --   --   PROCALCITON  --  0.28 0.25 0.28    ------------------------------------------------------------------------------------------------------------------ No results for input(s): CHOL, HDL, LDLCALC, TRIG, CHOLHDL, LDLDIRECT in the last 72 hours.  Lab Results  Component Value Date   HGBA1C 6.1 08/07/2017   ------------------------------------------------------------------------------------------------------------------ No results for input(s): TSH, T4TOTAL, T3FREE, THYROIDAB in the last 72 hours.  Invalid input(s): FREET3 ------------------------------------------------------------------------------------------------------------------ No results for input(s): VITAMINB12, FOLATE, FERRITIN, TIBC, IRON, RETICCTPCT in the last 72 hours.  Coagulation profile Recent Labs  Lab 06/30/19 1022  INR 0.8    No results for input(s): DDIMER in the last 72 hours.  Cardiac Enzymes No results for input(s): CKMB, TROPONINI, MYOGLOBIN in the last 168 hours.  Invalid input(s): CK ------------------------------------------------------------------------------------------------------------------    Component Value Date/Time   BNP 48.2 06/29/2019 0533    Micro Results Recent Results (from the past 240 hour(s))  SARS CORONAVIRUS 2 (TAT 6-24 HRS) Nasopharyngeal Nasopharyngeal Swab     Status: None   Collection Time: 06/28/19 12:42 AM   Specimen: Nasopharyngeal Swab  Result Value Ref Range Status   SARS Coronavirus 2 NEGATIVE NEGATIVE Final    Comment: (NOTE) SARS-CoV-2 target nucleic acids are NOT DETECTED. The SARS-CoV-2 RNA is generally detectable in upper and lower respiratory specimens during the acute phase  of infection. Negative results do not preclude SARS-CoV-2 infection, do not rule out co-infections with other pathogens, and should not be used as the sole basis for treatment or other patient management decisions. Negative results must be combined with clinical observations, patient history, and epidemiological information. The expected result is Negative. Fact Sheet for Patients: HairSlick.no Fact Sheet for Healthcare Providers: quierodirigir.com This test is not yet approved or cleared by the [place] FDA and  has been authorized for detection and/or diagnosis of SARS-CoV-2 by FDA under an Emergency Use Authorization (EUA). This EUA will remain  in effect (meaning this test can be used) for the duration of the COVID-19 declaration under Section 56 4(b)(1) of the Act, 21 U.S.C. section 360bbb-3(b)(1), unless the authorization is terminated or revoked sooner. Performed at [facility], [address], [place], [place] [address]   Culture, blood (routine x 2)     Status: None (Preliminary result)   Collection Time: 07/03/19  8:27 PM   Specimen: BLOOD  Result Value Ref Range Status   Specimen Description BLOOD LEFT ANTECUBITAL  Final   Special Requests NONE  Final   Culture   Final    NO GROWTH 2 DAYS Performed at [facility], [address], [place], [place] [address]    Report Status PENDING  Incomplete  Culture, blood (routine x 2)     Status: None (Preliminary result)   Collection Time: 07/03/19  8:33 PM   Specimen: BLOOD  Result Value Ref Range Status   Specimen Description BLOOD LEFT ANTECUBITAL  Final   Special Requests   Final    BOTTLES DRAWN AEROBIC AND ANAEROBIC Blood Culture adequate volume   Culture   Final    NO GROWTH 2 DAYS Performed at [facility], [address], [place], [place] [address]  Report Status PENDING  Incomplete    Radiology Reports MR Brain W and Wo  Contrast  Result Date: 06/27/2019 CLINICAL DATA:  54 year old male with increasing neurologic deficits, involuntary twitches and spasms. Status post cervical spine ACDF on 01/16/2019. EXAM: MRI HEAD WITHOUT AND WITH CONTRAST TECHNIQUE: Multiplanar, multiecho pulse sequences of the brain and surrounding structures were obtained without and with intravenous contrast. CONTRAST:  10mL GADAVIST GADOBUTROL 1 MMOL/ML IV SOLN COMPARISON:  No prior head MRI.  Head CT 10/15/2013. FINDINGS: Brain: Cerebral volume is similar to that in 2015. No restricted diffusion to suggest acute infarction. No midline shift, mass effect, evidence of mass lesion, ventriculomegaly, extra-axial collection or acute intracranial hemorrhage. Cervicomedullary junction and pituitary are within normal limits. Largely normal for age gray and white matter signal, minimal scattered nonspecific cerebral white matter T2 and FLAIR hyperintensity. No cortical encephalomalacia or chronic cerebral blood products. No abnormal enhancement identified. No dural thickening. Vascular: Major intracranial vascular flow voids are preserved. The major dural venous sinuses are enhancing and appear to be patent. Skull and upper cervical spine: Cervical spine details are reported separately. Visualized bone marrow signal is within normal limits. Sinuses/Orbits: Negative orbits. Paranasal sinuses and mastoids are stable and well pneumatized. Other: Scalp and face soft tissues appear negative. There is a tiny right nasopharyngeal retention cyst, inconsequential. IMPRESSION: No acute intracranial abnormality and largely unremarkable for age MRI appearance of the brain. Electronically Signed   By: Odessa FlemingH  Hall M.D.   On: 06/27/2019 22:32   MR Cervical Spine W or Wo Contrast  Result Date: 06/27/2019 CLINICAL DATA:  54 year old male with increasing neurologic deficits, involuntary twitches and spasms. Status post cervical spine ACDF on 01/16/2019. EXAM: MRI CERVICAL SPINE  WITHOUT AND WITH CONTRAST TECHNIQUE: Multiplanar and multiecho pulse sequences of the cervical spine, to include the craniocervical junction and cervicothoracic junction, were obtained without and with intravenous contrast. CONTRAST:  10mL GADAVIST GADOBUTROL 1 MMOL/ML IV SOLN COMPARISON:  Preoperative cervical spine MRI with contrast on 01/16/2019. And outside preoperative cervical spine without contrast 01/02/2019, now available on [facility] [employer] PACS. FINDINGS: Alignment: Straightening of cervical lordosis, less reversal compared to October. There may be mild residual retrolisthesis of C5 on C6. Vertebrae: Mild hardware susceptibility artifact from C5-C6 and C6-C7 ACDF, new from prior MRI. No marrow edema or evidence of acute osseous abnormality. Background bone marrow signal within normal limits. Cord: The spinal cord remains abnormal at C6-C7, although the extent of abnormal cord T2 signal is regressed compared to 01/02/2019, where the T2 abnormality continued to the C7-T1 level. Patchy abnormal T2 signal is now confined to the C6-C7 level, and on axial images appears to spare only the most peripheral cord white matter (series 9, image 33). The cord also appears less expanded. And following contrast there is only a faint 5 mm focus of residual enhancement (9 mm and more intensely enhancing previously). Above and below that level cord signal appears to remain normal. No other abnormal intradural enhancement. No dural thickening. Posterior Fossa, vertebral arteries, paraspinal tissues: Cervicomedullary junction is within normal limits. Preserved major vascular flow voids in the neck. Negative visible neck soft tissues. Disc levels: There is a congenital degree of cervical spinal canal narrowing related to short pedicles, with superimposed degenerative changes: C2-C3: Mild disc bulge and endplate spurring plus ligament flavum hypertrophy. Mild spinal stenosis but no cord mass effect (series 10, image 6). C3-C4: Mild disc  bulge and ligament flavum hypertrophy. Mild spinal stenosis but no cord mass effect. C4-C5: Circumferential disc  bulge and mild endplate spurring. Less ligament flavum hypertrophy here. Mild facet hypertrophy. Mild spinal stenosis. No cord mass effect. Mild to moderate C5 foraminal stenosis greater on the left. C5-C6: Interval ACDF. But there does appear to be residual spinal stenosis with mild cord mass effect at the C5 inferior endplate level (series 11, image 27). No cord signal abnormality. Endplate spurring results in severe left and mild right C6 foraminal stenosis (same image). C6-C7: Interval ACDF. Ligament flavum hypertrophy but no significant spinal stenosis. Foraminal patency also appears improved, mild residual stenosis on the right. C7-T1: Mostly foraminal disc bulge and endplate spurring. No spinal stenosis. Moderate left and mild right C8 foraminal stenosis. No upper thoracic spinal stenosis. IMPRESSION: 1. 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 2. Status post ACDF at C5-C6 and C6-C7. Resolved spinal stenosis at C6-C7, and regressed although not resolved abnormal spinal cord signal and enhancement centered at that level. 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 3. Mild cervical spinal stenosis elsewhere, including some residual cord mass effect related to C5 endplate spurring, but no other abnormal cord signal is identified. 4. Degenerative neural foraminal stenosis, up to moderate at the left C5 and C8 nerve levels, and severe at the left C6 nerve level. Preliminary results of this exam were discussed by telephone with Dr. [place] Sheldon in the ED. Electronically Signed   By: Odessa Fleming M.D.   On: 06/27/2019 22:47   MR THORACIC SPINE W WO CONTRAST  Result Date: 06/30/2019 CLINICAL DATA:  A shin initial evaluation for 1 month history of progressive weakness. EXAM: MRI THORACIC WITHOUT AND WITH CONTRAST TECHNIQUE:  Multiplanar and multiecho pulse sequences of the thoracic spine were obtained without and with intravenous contrast. CONTRAST:  4mL GADAVIST GADOBUTROL 1 MMOL/ML IV SOLN COMPARISON:  Prior MRI from 01/02/2019. FINDINGS: MRI THORACIC SPINE FINDINGS Alignment: Examination moderately degraded by motion artifact. Mild dextroscoliosis. Alignment otherwise normal with preservation of the normal thoracic kyphosis. No listhesis. Vertebrae: Vertebral body height maintained without evidence for acute or chronic fracture. Bone marrow signal intensity within normal limits. Few scattered subcentimeter benign hemangiomata noted. No worrisome osseous lesions. No abnormal marrow edema or enhancement. Cord: No definite cord signal abnormality seen on this motion degraded exam. Cord caliber and morphology within normal limits. No appreciable abnormal enhancement. Paraspinal and other soft tissues: Paraspinous soft tissues demonstrate no acute finding. Partially visualized lungs are clear. Exophytic T2 hyperintense simple cyst partially visualized extending from the posterior right kidney. Visualized visceral structures otherwise unremarkable. Disc levels: T1-2:  Unremarkable. T2-3: Negative interspace. Left-sided facet hypertrophy. No spinal stenosis. Mild left foraminal narrowing. T3-4: Diffuse disc bulge with superimposed left foraminal disc protrusion (series 19, image 8). Right greater than left facet hypertrophy. Resultant mild spinal stenosis without cord deformity. Mild left foraminal narrowing. T4-5: Mild disc bulge. Posterior element hypertrophy. No significant spinal stenosis. Foramina remain patent. T5-6: Tiny right paracentral disc protrusion (series 19, image 14). Posterior element hypertrophy. Mild prominence of the dorsal epidural fat. No significant spinal stenosis. Foramina remain patent. T6-7: Diffuse disc bulge. Superimposed small left paracentral disc protrusion mildly indents the left ventral thecal sac (series  19, image 17). Posterior element hypertrophy. Prominence of the dorsal epidural fat. No significant stenosis. T7-8: Prominence of the dorsal epidural fat with mild facet hypertrophy. Otherwise negative. No stenosis. T8-9: Minimal disc bulge. Posterior element hypertrophy with prominence of the dorsal epidural fat. No stenosis. T9-10: Mild diffuse disc bulge. Moderate bilateral facet hypertrophy. Resultant mild spinal  stenosis. Moderate left with mild right foraminal narrowing. T10-11: Diffuse disc bulge. Right greater than left facet hypertrophy. Resultant mild to moderate spinal stenosis (series 19, image 29). Mild left with moderate right foraminal narrowing. T11-12: Minimal disc bulge. Bilateral facet hypertrophy. No significant stenosis. T12-L1: Mild facet hypertrophy. Otherwise unremarkable. No stenosis. IMPRESSION: 1. Technically limited exam due to motion artifact. 2. Grossly normal MRI appearance of the thoracic cord with no cord signal abnormality identified. No acute abnormality or findings to explain patient's symptoms identified. 3. Multilevel degenerative disc bulging with facet hypertrophy as above. Resultant mild to moderate spinal stenosis at T9-10 and T10-11. Electronically Signed   By: Jeannine Boga M.D.   On: 06/30/2019 05:23   MR Lumbar Spine W Wo Contrast  Result Date: 06/27/2019 CLINICAL DATA:  54 year old male with increasing neurologic deficits, involuntary twitches and spasms. Status post cervical spine ACDF on 01/16/2019. EXAM: MRI LUMBAR SPINE WITHOUT AND WITH CONTRAST TECHNIQUE: Multiplanar and multiecho pulse sequences of the lumbar spine were obtained without and with intravenous contrast. CONTRAST:  18mL GADAVIST GADOBUTROL 1 MMOL/ML IV SOLN in conjunction with contrast enhanced imaging of the head and cervical spine reported separately. COMPARISON:  [facility] Lumbar MRIs 04/19/2019, 12/07/2018 FINDINGS: Segmentation:  Normal. Alignment: Stable  degenerative appearing retrolisthesis at L5-S1, straightening of lumbar lordosis elsewhere. There is also mild chronic retrolisthesis of L2 on L3. Vertebrae: No marrow edema or evidence of acute osseous abnormality. Visualized bone marrow signal is within normal limits. Intact visible sacrum and SI joints. Conus medullaris and cauda equina: Conus extends to the T12-L1 level. No lower spinal cord or conus signal abnormality. No abnormal intradural enhancement. No dural thickening. Paraspinal and other soft tissues: Stable and negative; benign appearing right renal midpole cyst. Disc levels: Since 04/19/2019 lower thoracic and lumbar thecal sac patency appears mildly improved above L4-L5 due to regression of epidural lipomatosis. At L2-L3 circumferential disc bulge with small posterior annular fissure and mild posterior element hypertrophy persist. Mild residual spinal stenosis at that level, improved from January. L4-L5: Persistent mild to moderate spinal stenosis in part due to epidural lipomatosis. Circumferential disc bulge, progressed right foraminal annular fissure since January (series 8 image 28). Moderate facet hypertrophy with degenerative facet joint fluid. No convincing lateral recess stenosis. Moderate right L4 foraminal stenosis. L5-S1: Continued epidural lipomatosis. Chronic retrolisthesis and disc space loss with circumferential disc osteophyte complex and moderate posterior element hypertrophy. Stable spinal and mild lateral recess stenosis. Stable moderate to severe left and moderate right L5 foraminal stenosis. IMPRESSION: 1. Lumbar thecal sac patency has improved since the January MRI at all levels above L4-L5, due to regressed epidural lipomatosis. 2. Chronic disc degeneration at L4-L5 has progressed in the form of new right foraminal annular fissure. Moderate right L4 foraminal stenosis. Mild to moderate multifactorial spinal stenosis is stable. 3. Advanced chronic L5-S1 degeneration is stable  with mild spinal and lateral recess stenosis, and moderate to severe L5 foraminal stenosis greater on the left. Electronically Signed   By: Genevie Ann M.D.   On: 06/27/2019 22:54   DG Chest Port 1 View  Result Date: 07/04/2019 CLINICAL DATA:  Shortness of breath and cough. EXAM: PORTABLE CHEST 1 VIEW COMPARISON:  06/14/2018 FINDINGS: Lungs are adequately inflated as patient is slightly rotated to the left. There is no focal airspace consolidation or effusion. Borderline stable cardiomegaly. Partially visualized fusion hardware over the lower cervical spine. Remainder of the exam is unchanged. IMPRESSION: No active disease. Electronically Signed  By: Elberta Fortis M.D.   On: 07/04/2019 10:17    Time Spent in minutes  30   Susa Raring M.D on 07/05/2019 at 9:54 AM  To page go to [email or web address] - password [email or web address]

## 2019-07-05 NOTE — Progress Notes (Signed)
Subjective: Feels he is improving. Complains of some paresthesias.   Exam: Vitals:   07/05/19 0548 07/05/19 0748  BP: 125/88 128/86  Pulse: 93 98  Resp:  17  Temp: 99.1 F (37.3 C) 99 F (37.2 C)  SpO2:  97%   Gen: In bed, NAD Resp: non-labored breathing, no acute distress Abd: soft, nt  Neuro: MS: awake, alert,  QH:UTMLY, EOMI Motor: He actually has fairly good effort in all extremities today, 5/5 hip flexion, otherwise 5/5. Sensory: Decreased temperature to BELOW the knee on the left and the knee opn the right and to the elbow bilaterally. This is improved compared to previous days.  Cerebellar: He continues to have significant ataxia, but I think it is slightly improved from initially  Pertinent Labs: Heavy metals-negative Ceruloplasmin-normal RPR-negative MMA-pending [id number] TSH-normal B1-pending Folate - 3.2 SPEP - pending  Impression: 54 year old male with acute sensory ataxia/sensation loss associated with albuminocytologic dissociation.  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   Today will be day 5 of IVIG. He does appear to be responding to treatment.   One other possibility would be folic acid deficiency, that this would not explain the elevated protein in the spinal fluid.  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  Recommendations: 1) completed 2) continue folic acid supplementaion.  3) increase gabapentin to 600mg  TID.   , MD [facility] [phone]  If 7pm- 7am, please page neurology on call as listed in [facility].

## 2019-07-05 NOTE — Progress Notes (Signed)
PROGRESS NOTE                                             Late entry                                                                                                                                                                Patient Demographics:    Jorge Weaver, is a 54 y.o. male, DOB - 07-May-1965, GHW:[id number]  Admit date - 06/27/2019   Admitting Physician Eduard Clos, MD  Outpatient Primary MD for the patient is [name], DO  LOS - 7  Chief Complaint  Patient presents with  . Weakness       Brief Narrative  - 54 y.o. male with history of hypertension previous alcohol abuse which patient states has stopped drinking for almost a month now who has had a surgery in October 2024 cervical 6 corpectomy and C5 7 arthrodesis by Dr. Franky Macho presents to the ER because of increasing tightness of the extremities difficulty to move difficulty walking and increasing numbness of the upper and lower extremities.  Denies any nausea vomiting diarrhea headache chest pain.  Has been having recurrent falls.  In the ER his MRI brain and C-spine were nonacute, this was discussed by the admitting physician with neurologist on-call at night Dr. Wilford Corner, in the morning I discussed his MRI findings with neurosurgeon Dr. Maisie Fus.  No surgical intervention or medical intervention needed according to the specialist.  Incidentally he was found to have life-threatening low severe hypokalemia along with ongoing weakness and admitted to the hospital.   Subjective:   Patient in bed, appears comfortable, denies any headache, no fever, no chest pain or pressure, no shortness of breath , no abdominal pain.  Improving weakness.    Assessment  & Plan :     1.  Generalized subacute weakness worse in the left upper extremity and left side of the face ongoing for several weeks in a patient with history of C-spine fusion surgery.  Case discussed by admitting physician  with neurologist Dr. Wilford Corner and me with neurosurgeon Dr. Maisie Fus.   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  Continue PT OT, may require rehab.  Overall weakness  is improving but still long ways to go.   2.  Multiple falls at home with lip and tongue injury during a fall along with avulsed right big toe nail.  Due to muscular weakness from #1 above, supportive care and monitor.  3. HTN - on high doses of Norvasc, Lopressor and hydralazine, 1 dose of IV Lasix on 07/02/2019 and monitor.  4.  History of alcohol abuse.  Counseled to quit.  Placed on folic acid and thiamine.  Last drink 1 month ago.  5.  Hyperkalemia.  Lasix and monitor.  6.  Hyponatremia.  Electrolyte studies suggest SIADH, fluid restriction and gentle Lasix.  Repeat BMP in the morning.    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  Consults  :  Neurology, N. Surgery Dr Maisie Fushomas over the phone - 06/28/19  Procedures  :    LP 06/30/19  MRI Brain - C Spine -   1. 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 2. Status post ACDF at C5-C6 and C6-C7. Resolved spinal stenosis at C6-C7, and regressed although not resolved abnormal spinal cord signal and enhancement centered at that level. 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 3. Mild cervical spinal stenosis elsewhere, including some residual  cord mass effect related to C5 endplate spurring, but no other abnormal cord signal is identified. 4. Degenerative neural foraminal stenosis, up to moderate at the left C5 and C8 nerve levels, and severe at the left C6 nerve level.   No acute intracranial abnormality and largely unremarkable for age MRI appearance of the brain.   DVT Prophylaxis  :   Heparin    Lab Results  Component Value Date   PLT 309 07/05/2019    Diet :  Diet Order            Diet Heart Room service appropriate? Yes; Fluid consistency: Thin  Diet effective now               Inpatient Medications  Scheduled Meds: . amLODipine  10 mg Oral Daily  . folic acid  1 mg Oral Daily  . gabapentin  300 mg Oral TID  . heparin injection (subcutaneous)  5,000 Units Subcutaneous Q8H  . hydrALAZINE  100 mg Oral Q8H  . metoprolol tartrate  100 mg Oral BID  . sodium chloride  1 g Oral BID WC  . thiamine injection  100 mg Intravenous Q24H   Continuous Infusions:  PRN Meds:.acetaminophen, hydrALAZINE, lip balm, [DISCONTINUED] ondansetron **OR** ondansetron (ZOFRAN) IV, zolpidem  Antibiotics  :   Anti-infectives (From admission, onward)   None        Objective:   Vitals:   07/04/19 2324 07/05/19 0142 07/05/19 0548 07/05/19 0748  BP: 113/81 123/84 125/88 128/86  Pulse: 88 88 93 98  Resp: 20 20  17   Temp: 99.2 F (37.3 C) 98.7 F (37.1 C) 99.1 F (37.3 C) 99 F (37.2 C)  TempSrc:  Oral Oral   SpO2: 97%   97%  Weight:      Height:        SpO2: 97 %  Wt Readings from Last 3 Encounters:  06/28/19 112.4 kg  06/16/19 122.5 kg  06/09/19 122.2 kg     Intake/Output Summary (Last 24 hours) at  07/05/2019 0953 Last data filed at 07/04/2019 2300 Gross per 24 hour  Intake 240 ml  Output 1550 ml  Net -1310 ml     Physical Exam  Awake Alert, No new F.N deficits, generalized weakness in all 4 extremities with strength 5/5 but somewhat weaker in the left arm as compared to other limbs but this is chronic  according to patient, mild left-sided facial droop   [place].AT,PERRAL Supple Neck,No JVD, No cervical lymphadenopathy appriciated.  Symmetrical Chest wall movement, Good air movement bilaterally, CTAB RRR,No Gallops, Rubs or new Murmurs, No Parasternal Heave +ve B.Sounds, Abd Soft, No tenderness, No organomegaly appriciated, No rebound - guarding or rigidity. No Cyanosis, Clubbing or edema, No new Rash or bruise    Data Review:    Recent Labs  Lab 07/01/19 0227 07/02/19 0216 07/03/19 0153 07/04/19 0207 07/05/19 0411  WBC 4.9 5.2 6.7 8.8 7.4  HGB 10.9* 11.5* 11.8* 10.6* 10.4*  HCT 32.9* 35.6* 34.8* 31.2* 31.4*  PLT 400 401* 292 315 309  MCV 105.1* 105.3* 102.4* 105.1* 105.0*  MCH 34.8* 34.0 34.7* 35.7* 34.8*  MCHC 33.1 32.3 33.9 34.0 33.1  RDW 13.3 13.5 13.5 13.5 13.4  LYMPHSABS 1.2 0.8 0.9 1.4 1.4  MONOABS 0.4 0.5 0.8 1.4* 1.1*  EOSABS 0.1 0.1 0.0 0.0 0.0  BASOSABS 0.0 0.0 0.0 0.0 0.0    Recent Labs  Lab 06/29/19 0533 06/29/19 0533 06/30/19 1022 06/30/19 1022 07/01/19 0227 07/02/19 0216 07/03/19 0153 07/03/19 2020 07/04/19 0207 07/05/19 0411  NA 136   < > 135   < > 131* 131* 129*  --  129* 125*  K 3.3*   < > 3.9   < > 4.9 4.7 5.2*  --  4.2 4.0  CL 96*   < > 99   < > 97* 98 97*  --  96* 94*  CO2 28   < > 26   < > --  26 23  GLUCOSE 110*   < > 138*   < > 130* 107* 119*  --  119* 93  BUN <5*   < > <5*   < > --  9 9  CREATININE 0.77   < > 0.77   < > 0.81 0.80 0.97  --  1.00 0.78  CALCIUM 9.1   < > 8.9   < > 9.1 9.1 8.9  --  8.6* 8.5*  AST 54*   < > 46*   < > 43* 52* 63*  --  44* 41  ALT 38   < > 37   < > 33 35 37  --  28 25  ALKPHOS 78   < > 75   < > 66 75 75  --  64 59  BILITOT 0.4   < > 0.7   < > 1.0 0.7 0.7  --  0.6 0.6  ALBUMIN 3.1*   < > 3.0*   < > 2.7* 2.8* 2.6*  --  2.4* 2.3*  MG 1.8   < > 1.8   < > 1.7 1.8 1.8  --  1.7 1.8  PROCALCITON  --   --   --   --   --   --   --  0.28 0.25 0.28  INR  --   --  0.8  --   --   --   --   --   --   --     BNP 48.2  --   --   --   --   --   --   --   --   --    < > =  values in this interval not displayed.    Recent Labs  Lab 06/29/19 0533 07/03/19 2020 07/04/19 0207 07/05/19 0411  BNP 48.2  --   --   --   PROCALCITON  --  0.28 0.25 0.28    ------------------------------------------------------------------------------------------------------------------ No results for input(s): CHOL, HDL, LDLCALC, TRIG, CHOLHDL, LDLDIRECT in the last 72 hours.  Lab Results  Component Value Date   HGBA1C 6.1 08/07/2017   ------------------------------------------------------------------------------------------------------------------ No results for input(s): TSH, T4TOTAL, T3FREE, THYROIDAB in the last 72 hours.  Invalid input(s): FREET3 ------------------------------------------------------------------------------------------------------------------ No results for input(s): VITAMINB12, FOLATE, FERRITIN, TIBC, IRON, RETICCTPCT in the last 72 hours.  Coagulation profile Recent Labs  Lab 06/30/19 1022  INR 0.8    No results for input(s): DDIMER in the last 72 hours.  Cardiac Enzymes No results for input(s): CKMB, TROPONINI, MYOGLOBIN in the last 168 hours.  Invalid input(s): CK ------------------------------------------------------------------------------------------------------------------    Component Value Date/Time   BNP 48.2 06/29/2019 0533    Micro Results Recent Results (from the past 240 hour(s))  SARS CORONAVIRUS 2 (TAT 6-24 HRS) Nasopharyngeal Nasopharyngeal Swab     Status: None   Collection Time: 06/28/19 12:42 AM   Specimen: Nasopharyngeal Swab  Result Value Ref Range Status   SARS Coronavirus 2 NEGATIVE NEGATIVE Final    Comment: (NOTE) SARS-CoV-2 target nucleic acids are NOT DETECTED. The SARS-CoV-2 RNA is generally detectable in upper and lower respiratory specimens during the acute phase of infection. Negative results do not preclude SARS-CoV-2 infection, do not  rule out co-infections with other pathogens, and should not be used as the sole basis for treatment or other patient management decisions. Negative results must be combined with clinical observations, patient history, and epidemiological information. The expected result is Negative. Fact Sheet for Patients: HairSlick.no Fact Sheet for Healthcare Providers: quierodirigir.com This test is not yet approved or cleared by the [place] FDA and  has been authorized for detection and/or diagnosis of SARS-CoV-2 by FDA under an Emergency Use Authorization (EUA). This EUA will remain  in effect (meaning this test can be used) for the duration of the COVID-19 declaration under Section 56 4(b)(1) of the Act, 21 U.S.C. section 360bbb-3(b)(1), unless the authorization is terminated or revoked sooner. Performed at [facility], [address], [place], [place] [address]   Culture, blood (routine x 2)     Status: None (Preliminary result)   Collection Time: 07/03/19  8:27 PM   Specimen: BLOOD  Result Value Ref Range Status   Specimen Description BLOOD LEFT ANTECUBITAL  Final   Special Requests NONE  Final   Culture   Final    NO GROWTH 2 DAYS Performed at [facility], [address], [place], [place] [address]    Report Status PENDING  Incomplete  Culture, blood (routine x 2)     Status: None (Preliminary result)   Collection Time: 07/03/19  8:33 PM   Specimen: BLOOD  Result Value Ref Range Status   Specimen Description BLOOD LEFT ANTECUBITAL  Final   Special Requests   Final    BOTTLES DRAWN AEROBIC AND ANAEROBIC Blood Culture adequate volume   Culture   Final    NO GROWTH 2 DAYS Performed at [facility], [address] [place], [place] [address]    Report Status PENDING  Incomplete    Radiology Reports MR Brain W and Wo Contrast  Result Date: 06/27/2019 CLINICAL DATA:  54 year old male with increasing  neurologic deficits, involuntary twitches and spasms.  Status post cervical spine ACDF on 01/16/2019. EXAM: MRI HEAD WITHOUT AND WITH CONTRAST TECHNIQUE: Multiplanar, multiecho pulse sequences of the brain and surrounding structures were obtained without and with intravenous contrast. CONTRAST:  10mL GADAVIST GADOBUTROL 1 MMOL/ML IV SOLN COMPARISON:  No prior head MRI.  Head CT 10/15/2013. FINDINGS: Brain: Cerebral volume is similar to that in 2015. No restricted diffusion to suggest acute infarction. No midline shift, mass effect, evidence of mass lesion, ventriculomegaly, extra-axial collection or acute intracranial hemorrhage. Cervicomedullary junction and pituitary are within normal limits. Largely normal for age gray and white matter signal, minimal scattered nonspecific cerebral white matter T2 and FLAIR hyperintensity. No cortical encephalomalacia or chronic cerebral blood products. No abnormal enhancement identified. No dural thickening. Vascular: Major intracranial vascular flow voids are preserved. The major dural venous sinuses are enhancing and appear to be patent. Skull and upper cervical spine: Cervical spine details are reported separately. Visualized bone marrow signal is within normal limits. Sinuses/Orbits: Negative orbits. Paranasal sinuses and mastoids are stable and well pneumatized. Other: Scalp and face soft tissues appear negative. There is a tiny right nasopharyngeal retention cyst, inconsequential. IMPRESSION: No acute intracranial abnormality and largely unremarkable for age MRI appearance of the brain. Electronically Signed   By: Odessa Fleming M.D.   On: 06/27/2019 22:32   MR Cervical Spine W or Wo Contrast  Result Date: 06/27/2019 CLINICAL DATA:  54 year old male with increasing neurologic deficits, involuntary twitches and spasms. Status post cervical spine ACDF on 01/16/2019. EXAM: MRI CERVICAL SPINE WITHOUT AND WITH CONTRAST TECHNIQUE: Multiplanar and multiecho pulse sequences of the  cervical spine, to include the craniocervical junction and cervicothoracic junction, were obtained without and with intravenous contrast. CONTRAST:  10mL GADAVIST GADOBUTROL 1 MMOL/ML IV SOLN COMPARISON:  Preoperative cervical spine MRI with contrast on 01/16/2019. And outside preoperative cervical spine without contrast 01/02/2019, now available on [employer]. FINDINGS: Alignment: Straightening of cervical lordosis, less reversal compared to October. There may be mild residual retrolisthesis of C5 on C6. Vertebrae: Mild hardware susceptibility artifact from C5-C6 and C6-C7 ACDF, new from prior MRI. No marrow edema or evidence of acute osseous abnormality. Background bone marrow signal within normal limits. Cord: The spinal cord remains abnormal at C6-C7, although the extent of abnormal cord T2 signal is regressed compared to 01/02/2019, where the T2 abnormality continued to the C7-T1 level. Patchy abnormal T2 signal is now confined to the C6-C7 level, and on axial images appears to spare only the most peripheral cord white matter (series 9, image 33). The cord also appears less expanded. And following contrast there is only a faint 5 mm focus of residual enhancement (9 mm and more intensely enhancing previously). Above and below that level cord signal appears to remain normal. No other abnormal intradural enhancement. No dural thickening. Posterior Fossa, vertebral arteries, paraspinal tissues: Cervicomedullary junction is within normal limits. Preserved major vascular flow voids in the neck. Negative visible neck soft tissues. Disc levels: There is a congenital degree of cervical spinal canal narrowing related to short pedicles, with superimposed degenerative changes: C2-C3: Mild disc bulge and endplate spurring plus ligament flavum hypertrophy. Mild spinal stenosis but no cord mass effect (series 10, image 6). C3-C4: Mild disc bulge and ligament flavum hypertrophy. Mild spinal stenosis but no cord mass effect.  C4-C5: Circumferential disc bulge and mild endplate spurring. Less ligament flavum hypertrophy here. Mild facet hypertrophy. Mild spinal stenosis. No cord mass effect. Mild to moderate C5 foraminal stenosis greater on the left. C5-C6: Interval ACDF. But  there does appear to be residual spinal stenosis with mild cord mass effect at the C5 inferior endplate level (series 11, image 27). No cord signal abnormality. Endplate spurring results in severe left and mild right C6 foraminal stenosis (same image). C6-C7: Interval ACDF. Ligament flavum hypertrophy but no significant spinal stenosis. Foraminal patency also appears improved, mild residual stenosis on the right. C7-T1: Mostly foraminal disc bulge and endplate spurring. No spinal stenosis. Moderate left and mild right C8 foraminal stenosis. No upper thoracic spinal stenosis. IMPRESSION: 1. 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 2. Status post ACDF at C5-C6 and C6-C7. Resolved spinal stenosis at C6-C7, and regressed although not resolved abnormal spinal cord signal and enhancement centered at that level. 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 3. Mild cervical spinal stenosis elsewhere, including some residual cord mass effect related to C5 endplate spurring, but no other abnormal cord signal is identified. 4. Degenerative neural foraminal stenosis, up to moderate at the left C5 and C8 nerve levels, and severe at the left C6 nerve level. Preliminary results of this exam were discussed by telephone with Dr. Mali Sheldon in the ED. Electronically Signed   By: Genevie Ann M.D.   On: 06/27/2019 22:47   MR THORACIC SPINE W WO CONTRAST  Result Date: 06/30/2019 CLINICAL DATA:  A shin initial evaluation for 1 month history of progressive weakness. EXAM: MRI THORACIC WITHOUT AND WITH CONTRAST TECHNIQUE: Multiplanar and multiecho pulse sequences of the thoracic spine were obtained without and with  intravenous contrast. CONTRAST:  16mL GADAVIST GADOBUTROL 1 MMOL/ML IV SOLN COMPARISON:  Prior MRI from 01/02/2019. FINDINGS: MRI THORACIC SPINE FINDINGS Alignment: Examination moderately degraded by motion artifact. Mild dextroscoliosis. Alignment otherwise normal with preservation of the normal thoracic kyphosis. No listhesis. Vertebrae: Vertebral body height maintained without evidence for acute or chronic fracture. Bone marrow signal intensity within normal limits. Few scattered subcentimeter benign hemangiomata noted. No worrisome osseous lesions. No abnormal marrow edema or enhancement. Cord: No definite cord signal abnormality seen on this motion degraded exam. Cord caliber and morphology within normal limits. No appreciable abnormal enhancement. Paraspinal and other soft tissues: Paraspinous soft tissues demonstrate no acute finding. Partially visualized lungs are clear. Exophytic T2 hyperintense simple cyst partially visualized extending from the posterior right kidney. Visualized visceral structures otherwise unremarkable. Disc levels: T1-2:  Unremarkable. T2-3: Negative interspace. Left-sided facet hypertrophy. No spinal stenosis. Mild left foraminal narrowing. T3-4: Diffuse disc bulge with superimposed left foraminal disc protrusion (series 19, image 8). Right greater than left facet hypertrophy. Resultant mild spinal stenosis without cord deformity. Mild left foraminal narrowing. T4-5: Mild disc bulge. Posterior element hypertrophy. No significant spinal stenosis. Foramina remain patent. T5-6: Tiny right paracentral disc protrusion (series 19, image 14). Posterior element hypertrophy. Mild prominence of the dorsal epidural fat. No significant spinal stenosis. Foramina remain patent. T6-7: Diffuse disc bulge. Superimposed small left paracentral disc protrusion mildly indents the left ventral thecal sac (series 19, image 17). Posterior element hypertrophy. Prominence of the dorsal epidural fat. No  significant stenosis. T7-8: Prominence of the dorsal epidural fat with mild facet hypertrophy. Otherwise negative. No stenosis. T8-9: Minimal disc bulge. Posterior element hypertrophy with prominence of the dorsal epidural fat. No stenosis. T9-10: Mild diffuse disc bulge. Moderate bilateral facet hypertrophy. Resultant mild spinal stenosis. Moderate left with mild right foraminal narrowing. T10-11: Diffuse disc bulge. Right greater than left facet hypertrophy. Resultant mild to moderate spinal stenosis (series 19, image 29). Mild left with moderate right foraminal  narrowing. T11-12: Minimal disc bulge. Bilateral facet hypertrophy. No significant stenosis. T12-L1: Mild facet hypertrophy. Otherwise unremarkable. No stenosis. IMPRESSION: 1. Technically limited exam due to motion artifact. 2. Grossly normal MRI appearance of the thoracic cord with no cord signal abnormality identified. No acute abnormality or findings to explain patient's symptoms identified. 3. Multilevel degenerative disc bulging with facet hypertrophy as above. Resultant mild to moderate spinal stenosis at T9-10 and T10-11. Electronically Signed   By: Rise Mu M.D.   On: 06/30/2019 05:23   MR Lumbar Spine W Wo Contrast  Result Date: 06/27/2019 CLINICAL DATA:  54 year old male with increasing neurologic deficits, involuntary twitches and spasms. Status post cervical spine ACDF on 01/16/2019. EXAM: MRI LUMBAR SPINE WITHOUT AND WITH CONTRAST TECHNIQUE: Multiplanar and multiecho pulse sequences of the lumbar spine were obtained without and with intravenous contrast. CONTRAST:  10mL GADAVIST GADOBUTROL 1 MMOL/ML IV SOLN in conjunction with contrast enhanced imaging of the head and cervical spine reported separately. COMPARISON:  [facility] Lumbar MRIs 04/19/2019, 12/07/2018 FINDINGS: Segmentation:  Normal. Alignment: Stable degenerative appearing retrolisthesis at L5-S1, straightening of lumbar lordosis elsewhere. There  is also mild chronic retrolisthesis of L2 on L3. Vertebrae: No marrow edema or evidence of acute osseous abnormality. Visualized bone marrow signal is within normal limits. Intact visible sacrum and SI joints. Conus medullaris and cauda equina: Conus extends to the T12-L1 level. No lower spinal cord or conus signal abnormality. No abnormal intradural enhancement. No dural thickening. Paraspinal and other soft tissues: Stable and negative; benign appearing right renal midpole cyst. Disc levels: Since 04/19/2019 lower thoracic and lumbar thecal sac patency appears mildly improved above L4-L5 due to regression of epidural lipomatosis. At L2-L3 circumferential disc bulge with small posterior annular fissure and mild posterior element hypertrophy persist. Mild residual spinal stenosis at that level, improved from January. L4-L5: Persistent mild to moderate spinal stenosis in part due to epidural lipomatosis. Circumferential disc bulge, progressed right foraminal annular fissure since January (series 8 image 28). Moderate facet hypertrophy with degenerative facet joint fluid. No convincing lateral recess stenosis. Moderate right L4 foraminal stenosis. L5-S1: Continued epidural lipomatosis. Chronic retrolisthesis and disc space loss with circumferential disc osteophyte complex and moderate posterior element hypertrophy. Stable spinal and mild lateral recess stenosis. Stable moderate to severe left and moderate right L5 foraminal stenosis. IMPRESSION: 1. Lumbar thecal sac patency has improved since the January MRI at all levels above L4-L5, due to regressed epidural lipomatosis. 2. Chronic disc degeneration at L4-L5 has progressed in the form of new right foraminal annular fissure. Moderate right L4 foraminal stenosis. Mild to moderate multifactorial spinal stenosis is stable. 3. Advanced chronic L5-S1 degeneration is stable with mild spinal and lateral recess stenosis, and moderate to severe L5 foraminal stenosis greater  on the left. Electronically Signed   By: Odessa Fleming M.D.   On: 06/27/2019 22:54   DG Chest Port 1 View  Result Date: 07/04/2019 CLINICAL DATA:  Shortness of breath and cough. EXAM: PORTABLE CHEST 1 VIEW COMPARISON:  06/14/2018 FINDINGS: Lungs are adequately inflated as patient is slightly rotated to the left. There is no focal airspace consolidation or effusion. Borderline stable cardiomegaly. Partially visualized fusion hardware over the lower cervical spine. Remainder of the exam is unchanged. IMPRESSION: No active disease. Electronically Signed   By: Elberta Fortis M.D.   On: 07/04/2019 10:17    Time Spent in minutes  30   Susa Raring M.D on 07/05/2019 at 9:53 AM  To page go to  [email or web address] - password [email or web address]

## 2019-07-06 ENCOUNTER — Inpatient Hospital Stay (HOSPITAL_COMMUNITY): Payer: BC Managed Care – PPO

## 2019-07-06 LAB — BASIC METABOLIC PANEL
Anion gap: 6 (ref 5–15)
Anion gap: 7 (ref 5–15)
BUN: 9 mg/dL (ref 6–20)
BUN: 6 mg/dL (ref 6–20)
CO2: 24 mmol/L (ref 22–32)
CO2: 24 mmol/L (ref 22–32)
Calcium: 8.5 mg/dL — ABNORMAL LOW (ref 8.9–10.3)
Calcium: 8.7 mg/dL — ABNORMAL LOW (ref 8.9–10.3)
Chloride: 96 mmol/L — ABNORMAL LOW (ref 98–111)
Chloride: 97 mmol/L — ABNORMAL LOW (ref 98–111)
Creatinine, Ser: 0.68 mg/dL (ref 0.61–1.24)
Creatinine, Ser: 0.69 mg/dL (ref 0.61–1.24)
GFR calc Af Amer: >60 mL/min (ref >60)
GFR calc Af Amer: >60 mL/min (ref >60)
GFR calc non Af Amer: >60 mL/min (ref >60)
GFR calc non Af Amer: >60 mL/min (ref >60)
Glucose, Bld: 104 mg/dL — ABNORMAL HIGH (ref 70–99)
Glucose, Bld: 117 mg/dL — ABNORMAL HIGH (ref 70–99)
Potassium: 4 mmol/L (ref 3.5–5.1)
Potassium: 4.2 mmol/L (ref 3.5–5.1)
Sodium: 126 mmol/L — ABNORMAL LOW (ref 135–145)
Sodium: 128 mmol/L — ABNORMAL LOW (ref 135–145)

## 2019-07-06 LAB — MAGNESIUM: Magnesium: 1.8 mg/dL (ref 1.7–2.4)

## 2019-07-06 LAB — PROCALCITONIN: Procalcitonin: 0.41 ng/mL

## 2019-07-06 MED ORDER — NYSTATIN 100000 UNIT/ML MT SUSP
5.0000 mL | Freq: Four times a day (QID) | OROMUCOSAL | Status: DC
  Administered 2019-07-06 – 2019-07-07 (×3): 500000 [IU] via ORAL
  Filled 2019-07-06 (×3): qty 5

## 2019-07-06 MED ORDER — SODIUM CHLORIDE 0.9 % IV SOLN: INTRAVENOUS | Status: AC

## 2019-07-06 NOTE — Progress Notes (Signed)
PROGRESS NOTE                                             Late entry                                                                                                                                                                Patient Demographics:    Jorge Weaver, is a 54 y.o. male, DOB - Feb 05, 1966, EAV:[id number]  Admit date - 06/27/2019   Admitting Physician Eduard ClosArshad N Kakrakandy, MD  Outpatient Primary MD for the patient is Carmelia RollerWendling, Jilda RocheNicholas Paul, DO  LOS - 8  Chief Complaint  Patient presents with  . Weakness       Brief Narrative  - 54 y.o. male with history of hypertension previous alcohol abuse which patient states has stopped drinking for almost a month now who has had a surgery in October 2024 cervical 6 corpectomy and C5 7 arthrodesis by Dr. Franky Machoabbell presents to the ER because of increasing tightness of the extremities difficulty to move difficulty walking and increasing numbness of the upper and lower extremities.  Denies any nausea vomiting diarrhea headache chest pain.  Has been having recurrent falls.  In the ER his MRI brain and C-spine were nonacute, this was discussed by the admitting physician with neurologist on-call at night Dr. Wilford CornerArora, in the morning I discussed his MRI findings with neurosurgeon Dr. Maisie Fushomas.  No surgical intervention or medical intervention needed according to the specialist.  Incidentally he was found to have life-threatening low severe hypokalemia along with ongoing weakness and admitted to the hospital.   Subjective:   Patient in bed, appears comfortable, denies any headache, no fever, no chest pain or pressure, no shortness of breath , no abdominal pain. No focal weakness.    Assessment  & Plan :     1.  Generalized subacute weakness worse in the left upper extremity and left side of the face ongoing for several weeks in a patient with history of C-spine fusion surgery.  Case discussed by admitting physician with  neurologist Dr. Wilford CornerArora and me with neurosurgeon Dr. Maisie Fushomas.   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  Continue PT OT and discharge to CIR once problem #6 has improved.   2.  Multiple falls at  home with lip and tongue injury during a fall along with avulsed right big toe nail.  Due to muscular weakness from #1 above, supportive care and monitor.  3. HTN - on high doses of Norvasc, Lopressor and hydralazine, 1 dose of IV Lasix on 07/02/2019 and monitor.  4.  History of alcohol abuse.  Counseled to quit.  Placed on folic acid and thiamine.  Last drink 1 month ago.  5.  Hyperkalemia.  Improved after Lasix.  6.  Hyponatremia.  Studies suggestive of SIADH, with hypoosmolality in the serum, urine osmolality greater than serum osmolality and certainly greater than 100, low serum uric acid, he was placed on fluid restriction, received Lasix and Samsca however surprisingly his sodium dropped thereafter will challenge him with IV fluids to rule out hypovolemia he is certainly not hypervolemic.  If sodium does not budge will repeat Samsca, continue free water restriction.  Discussed with nephrologist Dr. Marisue Humble.    Family Communication  :  Wife [name] - [phone], 06/28/19, 06/30/19, 07/01/19  Code Status : Full  Disposition Plan  : CIR once sodium improves  Consults  :  Neurology, N. Surgery Dr Maisie Fus over the phone - 06/28/19  Procedures  :    LP 06/30/19  MRI Brain - C Spine -   1. 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 2. Status post ACDF at C5-C6 and C6-C7. Resolved spinal stenosis at C6-C7, and regressed although not resolved abnormal spinal cord  signal and enhancement centered at that level. 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 3. Mild cervical spinal stenosis elsewhere, including some residual cord mass effect related to C5 endplate spurring, but no other abnormal cord signal is identified. 4. Degenerative neural foraminal stenosis, up to moderate at the left C5 and C8 nerve levels, and severe at the left C6 nerve level.   No acute intracranial abnormality and largely unremarkable for age MRI appearance of the brain.   DVT Prophylaxis  :   Heparin    Lab Results  Component Value Date   PLT 309 07/05/2019    Diet :  Diet Order            Diet Heart Room service appropriate? Yes; Fluid consistency: Thin  Diet effective now               Inpatient Medications  Scheduled Meds: . amLODipine  10 mg Oral Daily  . folic acid  1 mg Oral Daily  . gabapentin  300 mg Oral TID  . heparin injection (subcutaneous)  5,000 Units Subcutaneous Q8H  . hydrALAZINE  100 mg Oral Q8H  . metoprolol tartrate  100 mg Oral BID  . sodium chloride  1 g Oral BID WC  . thiamine injection  100 mg Intravenous Q24H   Continuous Infusions: . sodium chloride 125 mL/hr at 07/06/19 0751   PRN Meds:.acetaminophen, hydrALAZINE, lip balm, [DISCONTINUED] ondansetron **OR** ondansetron (ZOFRAN) IV, zolpidem  Antibiotics  :   Anti-infectives (From admission, onward)   None        Objective:   Vitals:   07/05/19 1654 07/05/19 1954 07/05/19 2117 07/06/19 0028  BP: (!) 131/96 (!) 130/95 (!) 140/96 (!) 129/94  Pulse: 97 95 (!) 107 90  Resp:  17  17  Temp: 98.8 F (37.1 C) 98.1 F (36.7 C)  99 F (37.2 C)  TempSrc:  Oral  Oral  SpO2: 98% 96%  99%  Weight:      Height:  SpO2: 99 %  Wt Readings from Last 3 Encounters:  06/28/19 112.4 kg  06/16/19 122.5 kg  06/09/19 122.2 kg    No intake or output data in the 24 hours ending 07/06/19 1033   Physical Exam  Awake Alert,    generalized weakness in all 4 extremities with strength 5/5 but somewhat weaker in the left arm as compared to other limbs but this is chronic according to patient, mild left-sided facial droop   [place].AT,PERRAL Supple Neck,No JVD, No cervical lymphadenopathy appriciated.  Symmetrical Chest wall movement, Good air movement bilaterally, CTAB RRR,No Gallops, Rubs or new Murmurs, No Parasternal Heave +ve B.Sounds, Abd Soft, No tenderness, No organomegaly appriciated, No rebound - guarding or rigidity. No Cyanosis, Clubbing or edema, No new Rash or bruise    Data Review:    Recent Labs  Lab 07/01/19 0227 07/02/19 0216 07/03/19 0153 07/04/19 0207 07/05/19 0411  WBC 4.9 5.2 6.7 8.8 7.4  HGB 10.9* 11.5* 11.8* 10.6* 10.4*  HCT 32.9* 35.6* 34.8* 31.2* 31.4*  PLT 400 401* 292 315 309  MCV 105.1* 105.3* 102.4* 105.1* 105.0*  MCH 34.8* 34.0 34.7* 35.7* 34.8*  MCHC 33.1 32.3 33.9 34.0 33.1  RDW 13.3 13.5 13.5 13.5 13.4  LYMPHSABS 1.2 0.8 0.9 1.4 1.4  MONOABS 0.4 0.5 0.8 1.4* 1.1*  EOSABS 0.1 0.1 0.0 0.0 0.0  BASOSABS 0.0 0.0 0.0 0.0 0.0    Recent Labs  Lab 06/30/19 1022 06/30/19 1022 07/01/19 0227 07/01/19 0227 07/02/19 0216 07/02/19 0216 07/03/19 0153 07/03/19 2020 07/04/19 0207 07/05/19 0411 07/05/19 2054 07/06/19 0236  NA 135   < > 131*   < > 131*   < > 129*  --  129* 125* 125* 126*  K 3.9   < > 4.9   < > 4.7   < > 5.2*  --  4.2 4.0 4.2 4.0  CL 99   < > 97*   < > 98   < > 97*  --  96* 94* 96* 96*  CO2 26   < > 24   < > 24   < > 23  --  GLUCOSE 138*   < > 130*   < > 107*   < > 119*  --  119* 93 125* 104*  BUN <5*   < > 8   < > 8   < > 10  --  CREATININE 0.77   < > 0.81   < > 0.80   < > 0.97  --  1.00 0.78 0.72 0.68  CALCIUM 8.9   < > 9.1   < > 9.1   < > 8.9  --  8.6* 8.5* 8.5* 8.5*  AST 46*   < > 43*  --  52*  --  63*  --  44* 41  --   --   ALT 37   < > 33  --  35  --  37  --  28 25  --   --   ALKPHOS 75   < > 66  --  75  --  75  --  64 59  --   --     BILITOT 0.7   < > 1.0  --  0.7  --  0.7  --  0.6 0.6  --   --   ALBUMIN 3.0*   < > 2.7*  --  2.8*  --  2.6*  --  2.4*  2.3*  --   --   MG 1.8   < > 1.7   < > 1.8  --  1.8  --  1.7 1.8  --  1.8  PROCALCITON  --   --   --   --   --   --   --  0.28 0.25 0.28  --  0.41  INR 0.8  --   --   --   --   --   --   --   --   --   --   --    < > = values in this interval not displayed.    Recent Labs  Lab 07/03/19 2020 07/04/19 0207 07/05/19 0411 07/06/19 0236  PROCALCITON 0.28 0.25 0.28 0.41    ------------------------------------------------------------------------------------------------------------------ No results for input(s): CHOL, HDL, LDLCALC, TRIG, CHOLHDL, LDLDIRECT in the last 72 hours.  Lab Results  Component Value Date   HGBA1C 6.1 08/07/2017   ------------------------------------------------------------------------------------------------------------------ No results for input(s): TSH, T4TOTAL, T3FREE, THYROIDAB in the last 72 hours.  Invalid input(s): FREET3 ------------------------------------------------------------------------------------------------------------------ Recent Labs    07/05/19 1411  FOLATE 22.1    Coagulation profile Recent Labs  Lab 06/30/19 1022  INR 0.8    No results for input(s): DDIMER in the last 72 hours.  Cardiac Enzymes No results for input(s): CKMB, TROPONINI, MYOGLOBIN in the last 168 hours.  Invalid input(s): CK ------------------------------------------------------------------------------------------------------------------    Component Value Date/Time   BNP 48.2 06/29/2019 0533    Micro Results Recent Results (from the past 240 hour(s))  SARS CORONAVIRUS 2 (TAT 6-24 HRS) Nasopharyngeal Nasopharyngeal Swab     Status: None   Collection Time: 06/28/19 12:42 AM   Specimen: Nasopharyngeal Swab  Result Value Ref Range Status   SARS Coronavirus 2 NEGATIVE NEGATIVE Final    Comment: (NOTE) SARS-CoV-2 target nucleic acids are  NOT DETECTED. The SARS-CoV-2 RNA is generally detectable in upper and lower respiratory specimens during the acute phase of infection. Negative results do not preclude SARS-CoV-2 infection, do not rule out co-infections with other pathogens, and should not be used as the sole basis for treatment or other patient management decisions. Negative results must be combined with clinical observations, patient history, and epidemiological information. The expected result is Negative. Fact Sheet for Patients: HairSlick.no Fact Sheet for Healthcare Providers: quierodirigir.com This test is not yet approved or cleared by the [place] FDA and  has been authorized for detection and/or diagnosis of SARS-CoV-2 by FDA under an Emergency Use Authorization (EUA). This EUA will remain  in effect (meaning this test can be used) for the duration of the COVID-19 declaration under Section 56 4(b)(1) of the Act, 21 U.S.C. section 360bbb-3(b)(1), unless the authorization is terminated or revoked sooner. Performed at [facility], [address], [place], [place] [address]   Culture, blood (routine x 2)     Status: None (Preliminary result)   Collection Time: 07/03/19  8:27 PM   Specimen: BLOOD  Result Value Ref Range Status   Specimen Description BLOOD LEFT ANTECUBITAL  Final   Special Requests NONE  Final   Culture   Final    NO GROWTH 3 DAYS Performed at [facility], [address] [place], [place] [address]    Report Status PENDING  Incomplete  Culture, blood (routine x 2)     Status: None (Preliminary result)   Collection Time: 07/03/19  8:33 PM   Specimen: BLOOD  Result Value Ref Range Status   Specimen Description  BLOOD LEFT ANTECUBITAL  Final   Special Requests   Final    BOTTLES DRAWN AEROBIC AND ANAEROBIC Blood Culture adequate volume   Culture   Final    NO GROWTH 3 DAYS Performed at [facility], [address], [place], [place] [address]    Report Status PENDING  Incomplete    Radiology Reports MR Brain W and Wo Contrast  Result Date: 06/27/2019 CLINICAL DATA:  54 year old male with increasing neurologic deficits, involuntary twitches and spasms. Status post cervical spine ACDF on 01/16/2019. EXAM: MRI HEAD WITHOUT AND WITH CONTRAST TECHNIQUE: Multiplanar, multiecho pulse sequences of the brain and surrounding structures were obtained without and with intravenous contrast. CONTRAST:  37mL GADAVIST GADOBUTROL 1 MMOL/ML IV SOLN COMPARISON:  No prior head MRI.  Head CT 10/15/2013. FINDINGS: Brain: Cerebral volume is similar to that in 2015. No restricted diffusion to suggest acute infarction. No midline shift, mass effect, evidence of mass lesion, ventriculomegaly, extra-axial collection or acute intracranial hemorrhage. Cervicomedullary junction and pituitary are within normal limits. Largely normal for age gray and white matter signal, minimal scattered nonspecific cerebral white matter T2 and FLAIR hyperintensity. No cortical encephalomalacia or chronic cerebral blood products. No abnormal enhancement identified. No dural thickening. Vascular: Major intracranial vascular flow voids are preserved. The major dural venous sinuses are enhancing and appear to be patent. Skull and upper cervical spine: Cervical spine details are reported separately. Visualized bone marrow signal is within normal limits. Sinuses/Orbits: Negative orbits. Paranasal sinuses and mastoids are stable and well pneumatized. Other: Scalp and face soft tissues appear negative. There is a tiny right nasopharyngeal retention cyst, inconsequential. IMPRESSION: No acute intracranial abnormality and largely unremarkable for age MRI appearance of the brain. Electronically Signed   By: Genevie Ann M.D.   On: 06/27/2019 22:32   MR Cervical Spine W or Wo Contrast  Result Date: 06/27/2019 CLINICAL DATA:  54 year old male with increasing neurologic deficits,  involuntary twitches and spasms. Status post cervical spine ACDF on 01/16/2019. EXAM: MRI CERVICAL SPINE WITHOUT AND WITH CONTRAST TECHNIQUE: Multiplanar and multiecho pulse sequences of the cervical spine, to include the craniocervical junction and cervicothoracic junction, were obtained without and with intravenous contrast. CONTRAST:  63mL GADAVIST GADOBUTROL 1 MMOL/ML IV SOLN COMPARISON:  Preoperative cervical spine MRI with contrast on 01/16/2019. And outside preoperative cervical spine without contrast 01/02/2019, now available on [facility]. FINDINGS: Alignment: Straightening of cervical lordosis, less reversal compared to October. There may be mild residual retrolisthesis of C5 on C6. Vertebrae: Mild hardware susceptibility artifact from C5-C6 and C6-C7 ACDF, new from prior MRI. No marrow edema or evidence of acute osseous abnormality. Background bone marrow signal within normal limits. Cord: The spinal cord remains abnormal at C6-C7, although the extent of abnormal cord T2 signal is regressed compared to 01/02/2019, where the T2 abnormality continued to the C7-T1 level. Patchy abnormal T2 signal is now confined to the C6-C7 level, and on axial images appears to spare only the most peripheral cord white matter (series 9, image 33). The cord also appears less expanded. And following contrast there is only a faint 5 mm focus of residual enhancement (9 mm and more intensely enhancing previously). Above and below that level cord signal appears to remain normal. No other abnormal intradural enhancement. No dural thickening. Posterior Fossa, vertebral arteries, paraspinal tissues: Cervicomedullary junction is within normal limits. Preserved major vascular flow voids in the neck. Negative visible neck soft tissues. Disc levels: There is a congenital degree of cervical spinal  canal narrowing related to short pedicles, with superimposed degenerative changes: C2-C3: Mild disc bulge and endplate spurring plus ligament  flavum hypertrophy. Mild spinal stenosis but no cord mass effect (series 10, image 6). C3-C4: Mild disc bulge and ligament flavum hypertrophy. Mild spinal stenosis but no cord mass effect. C4-C5: Circumferential disc bulge and mild endplate spurring. Less ligament flavum hypertrophy here. Mild facet hypertrophy. Mild spinal stenosis. No cord mass effect. Mild to moderate C5 foraminal stenosis greater on the left. C5-C6: Interval ACDF. But there does appear to be residual spinal stenosis with mild cord mass effect at the C5 inferior endplate level (series 11, image 27). No cord signal abnormality. Endplate spurring results in severe left and mild right C6 foraminal stenosis (same image). C6-C7: Interval ACDF. Ligament flavum hypertrophy but no significant spinal stenosis. Foraminal patency also appears improved, mild residual stenosis on the right. C7-T1: Mostly foraminal disc bulge and endplate spurring. No spinal stenosis. Moderate left and mild right C8 foraminal stenosis. No upper thoracic spinal stenosis. IMPRESSION: 1. 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 2. Status post ACDF at C5-C6 and C6-C7. Resolved spinal stenosis at C6-C7, and regressed although not resolved abnormal spinal cord signal and enhancement centered at that level. 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 3. Mild cervical spinal stenosis elsewhere, including some residual cord mass effect related to C5 endplate spurring, but no other abnormal cord signal is identified. 4. Degenerative neural foraminal stenosis, up to moderate at the left C5 and C8 nerve levels, and severe at the left C6 nerve level. Preliminary results of this exam were discussed by telephone with Dr. [place] Sheldon in the ED. Electronically Signed   By: Odessa Fleming M.D.   On: 06/27/2019 22:47   MR THORACIC SPINE W WO CONTRAST  Result Date: 06/30/2019 CLINICAL DATA:  A shin initial evaluation for 1  month history of progressive weakness. EXAM: MRI THORACIC WITHOUT AND WITH CONTRAST TECHNIQUE: Multiplanar and multiecho pulse sequences of the thoracic spine were obtained without and with intravenous contrast. CONTRAST:  53mL GADAVIST GADOBUTROL 1 MMOL/ML IV SOLN COMPARISON:  Prior MRI from 01/02/2019. FINDINGS: MRI THORACIC SPINE FINDINGS Alignment: Examination moderately degraded by motion artifact. Mild dextroscoliosis. Alignment otherwise normal with preservation of the normal thoracic kyphosis. No listhesis. Vertebrae: Vertebral body height maintained without evidence for acute or chronic fracture. Bone marrow signal intensity within normal limits. Few scattered subcentimeter benign hemangiomata noted. No worrisome osseous lesions. No abnormal marrow edema or enhancement. Cord: No definite cord signal abnormality seen on this motion degraded exam. Cord caliber and morphology within normal limits. No appreciable abnormal enhancement. Paraspinal and other soft tissues: Paraspinous soft tissues demonstrate no acute finding. Partially visualized lungs are clear. Exophytic T2 hyperintense simple cyst partially visualized extending from the posterior right kidney. Visualized visceral structures otherwise unremarkable. Disc levels: T1-2:  Unremarkable. T2-3: Negative interspace. Left-sided facet hypertrophy. No spinal stenosis. Mild left foraminal narrowing. T3-4: Diffuse disc bulge with superimposed left foraminal disc protrusion (series 19, image 8). Right greater than left facet hypertrophy. Resultant mild spinal stenosis without cord deformity. Mild left foraminal narrowing. T4-5: Mild disc bulge. Posterior element hypertrophy. No significant spinal stenosis. Foramina remain patent. T5-6: Tiny right paracentral disc protrusion (series 19, image 14). Posterior element hypertrophy. Mild prominence of the dorsal epidural fat. No significant spinal stenosis. Foramina remain patent. T6-7: Diffuse disc bulge.  Superimposed small left paracentral disc protrusion mildly indents the left ventral thecal sac (series 19, image 17). Posterior element  hypertrophy. Prominence of the dorsal epidural fat. No significant stenosis. T7-8: Prominence of the dorsal epidural fat with mild facet hypertrophy. Otherwise negative. No stenosis. T8-9: Minimal disc bulge. Posterior element hypertrophy with prominence of the dorsal epidural fat. No stenosis. T9-10: Mild diffuse disc bulge. Moderate bilateral facet hypertrophy. Resultant mild spinal stenosis. Moderate left with mild right foraminal narrowing. T10-11: Diffuse disc bulge. Right greater than left facet hypertrophy. Resultant mild to moderate spinal stenosis (series 19, image 29). Mild left with moderate right foraminal narrowing. T11-12: Minimal disc bulge. Bilateral facet hypertrophy. No significant stenosis. T12-L1: Mild facet hypertrophy. Otherwise unremarkable. No stenosis. IMPRESSION: 1. Technically limited exam due to motion artifact. 2. Grossly normal MRI appearance of the thoracic cord with no cord signal abnormality identified. No acute abnormality or findings to explain patient's symptoms identified. 3. Multilevel degenerative disc bulging with facet hypertrophy as above. Resultant mild to moderate spinal stenosis at T9-10 and T10-11. Electronically Signed   By: Rise Mu M.D.   On: 06/30/2019 05:23   MR Lumbar Spine W Wo Contrast  Result Date: 06/27/2019 CLINICAL DATA:  54 year old male with increasing neurologic deficits, involuntary twitches and spasms. Status post cervical spine ACDF on 01/16/2019. EXAM: MRI LUMBAR SPINE WITHOUT AND WITH CONTRAST TECHNIQUE: Multiplanar and multiecho pulse sequences of the lumbar spine were obtained without and with intravenous contrast. CONTRAST:  10mL GADAVIST GADOBUTROL 1 MMOL/ML IV SOLN in conjunction with contrast enhanced imaging of the head and cervical spine reported separately. COMPARISON:  [facility]  [place] Lumbar MRIs 04/19/2019, 12/07/2018 FINDINGS: Segmentation:  Normal. Alignment: Stable degenerative appearing retrolisthesis at L5-S1, straightening of lumbar lordosis elsewhere. There is also mild chronic retrolisthesis of L2 on L3. Vertebrae: No marrow edema or evidence of acute osseous abnormality. Visualized bone marrow signal is within normal limits. Intact visible sacrum and SI joints. Conus medullaris and cauda equina: Conus extends to the T12-L1 level. No lower spinal cord or conus signal abnormality. No abnormal intradural enhancement. No dural thickening. Paraspinal and other soft tissues: Stable and negative; benign appearing right renal midpole cyst. Disc levels: Since 04/19/2019 lower thoracic and lumbar thecal sac patency appears mildly improved above L4-L5 due to regression of epidural lipomatosis. At L2-L3 circumferential disc bulge with small posterior annular fissure and mild posterior element hypertrophy persist. Mild residual spinal stenosis at that level, improved from January. L4-L5: Persistent mild to moderate spinal stenosis in part due to epidural lipomatosis. Circumferential disc bulge, progressed right foraminal annular fissure since January (series 8 image 28). Moderate facet hypertrophy with degenerative facet joint fluid. No convincing lateral recess stenosis. Moderate right L4 foraminal stenosis. L5-S1: Continued epidural lipomatosis. Chronic retrolisthesis and disc space loss with circumferential disc osteophyte complex and moderate posterior element hypertrophy. Stable spinal and mild lateral recess stenosis. Stable moderate to severe left and moderate right L5 foraminal stenosis. IMPRESSION: 1. Lumbar thecal sac patency has improved since the January MRI at all levels above L4-L5, due to regressed epidural lipomatosis. 2. Chronic disc degeneration at L4-L5 has progressed in the form of new right foraminal annular fissure. Moderate right L4 foraminal stenosis. Mild to  moderate multifactorial spinal stenosis is stable. 3. Advanced chronic L5-S1 degeneration is stable with mild spinal and lateral recess stenosis, and moderate to severe L5 foraminal stenosis greater on the left. Electronically Signed   By: Odessa Fleming M.D.   On: 06/27/2019 22:54   DG Chest Port 1 View  Result Date: 07/04/2019 CLINICAL DATA:  Shortness of breath and cough. EXAM: PORTABLE  CHEST 1 VIEW COMPARISON:  06/14/2018 FINDINGS: Lungs are adequately inflated as patient is slightly rotated to the left. There is no focal airspace consolidation or effusion. Borderline stable cardiomegaly. Partially visualized fusion hardware over the lower cervical spine. Remainder of the exam is unchanged. IMPRESSION: No active disease. Electronically Signed   By: Elberta Fortis M.D.   On: 07/04/2019 10:17    Time Spent in minutes  30   Susa Raring M.D on 07/06/2019 at 10:33 AM  To page go to [email or web address] - password [facility]

## 2019-07-07 ENCOUNTER — Inpatient Hospital Stay (HOSPITAL_COMMUNITY): Payer: BC Managed Care – PPO | Admitting: Occupational Therapy

## 2019-07-07 ENCOUNTER — Encounter (HOSPITAL_COMMUNITY): Payer: Self-pay | Admitting: Physical Medicine and Rehabilitation

## 2019-07-07 ENCOUNTER — Inpatient Hospital Stay (HOSPITAL_COMMUNITY): Payer: BC Managed Care – PPO

## 2019-07-07 ENCOUNTER — Inpatient Hospital Stay (HOSPITAL_COMMUNITY)
Admission: RE | Admit: 2019-07-07 | Discharge: 2019-08-02 | DRG: 945 | Disposition: A | Discharge status: Home Health | Payer: BC Managed Care – PPO | Source: Intra-hospital | Attending: Physical Medicine and Rehabilitation | Admitting: Physical Medicine and Rehabilitation

## 2019-07-07 ENCOUNTER — Inpatient Hospital Stay (HOSPITAL_COMMUNITY): Payer: BC Managed Care – PPO | Admitting: Physical Therapy

## 2019-07-07 ENCOUNTER — Other Ambulatory Visit: Payer: Self-pay

## 2019-07-07 DIAGNOSIS — R270 Ataxia, unspecified: Secondary | ICD-10-CM | POA: Diagnosis not present

## 2019-07-07 DIAGNOSIS — Z79899 Other long term (current) drug therapy: Secondary | ICD-10-CM

## 2019-07-07 DIAGNOSIS — N319 Neuromuscular dysfunction of bladder, unspecified: Secondary | ICD-10-CM

## 2019-07-07 DIAGNOSIS — R0989 Other specified symptoms and signs involving the circulatory and respiratory systems: Secondary | ICD-10-CM | POA: Diagnosis not present

## 2019-07-07 DIAGNOSIS — K219 Gastro-esophageal reflux disease without esophagitis: Secondary | ICD-10-CM | POA: Diagnosis present

## 2019-07-07 DIAGNOSIS — R29810 Facial weakness: Secondary | ICD-10-CM | POA: Diagnosis present

## 2019-07-07 DIAGNOSIS — M62838 Other muscle spasm: Secondary | ICD-10-CM | POA: Diagnosis not present

## 2019-07-07 DIAGNOSIS — R471 Dysarthria and anarthria: Secondary | ICD-10-CM | POA: Diagnosis present

## 2019-07-07 DIAGNOSIS — Z800 Family history of malignant neoplasm of digestive organs: Secondary | ICD-10-CM

## 2019-07-07 DIAGNOSIS — R05 Cough: Secondary | ICD-10-CM | POA: Diagnosis not present

## 2019-07-07 DIAGNOSIS — E871 Hypo-osmolality and hyponatremia: Secondary | ICD-10-CM

## 2019-07-07 DIAGNOSIS — K5900 Constipation, unspecified: Secondary | ICD-10-CM | POA: Diagnosis present

## 2019-07-07 DIAGNOSIS — G4700 Insomnia, unspecified: Secondary | ICD-10-CM | POA: Diagnosis not present

## 2019-07-07 DIAGNOSIS — G629 Polyneuropathy, unspecified: Secondary | ICD-10-CM | POA: Diagnosis present

## 2019-07-07 DIAGNOSIS — G610 Guillain-Barre syndrome: Secondary | ICD-10-CM | POA: Diagnosis present

## 2019-07-07 DIAGNOSIS — I951 Orthostatic hypotension: Secondary | ICD-10-CM | POA: Diagnosis not present

## 2019-07-07 DIAGNOSIS — I10 Essential (primary) hypertension: Secondary | ICD-10-CM | POA: Diagnosis present

## 2019-07-07 DIAGNOSIS — R509 Fever, unspecified: Secondary | ICD-10-CM

## 2019-07-07 DIAGNOSIS — M545 Low back pain: Secondary | ICD-10-CM | POA: Diagnosis present

## 2019-07-07 DIAGNOSIS — R058 Other specified cough: Secondary | ICD-10-CM

## 2019-07-07 DIAGNOSIS — M792 Neuralgia and neuritis, unspecified: Secondary | ICD-10-CM

## 2019-07-07 DIAGNOSIS — Z8249 Family history of ischemic heart disease and other diseases of the circulatory system: Secondary | ICD-10-CM | POA: Diagnosis not present

## 2019-07-07 DIAGNOSIS — R945 Abnormal results of liver function studies: Secondary | ICD-10-CM | POA: Diagnosis not present

## 2019-07-07 DIAGNOSIS — D62 Acute posthemorrhagic anemia: Secondary | ICD-10-CM | POA: Diagnosis not present

## 2019-07-07 DIAGNOSIS — Y848 Other medical procedures as the cause of abnormal reaction of the patient, or of later complication, without mention of misadventure at the time of the procedure: Secondary | ICD-10-CM | POA: Diagnosis present

## 2019-07-07 DIAGNOSIS — R062 Wheezing: Secondary | ICD-10-CM | POA: Diagnosis not present

## 2019-07-07 DIAGNOSIS — R5381 Other malaise: Principal | ICD-10-CM | POA: Diagnosis present

## 2019-07-07 DIAGNOSIS — S30812A Abrasion of penis, initial encounter: Secondary | ICD-10-CM | POA: Diagnosis present

## 2019-07-07 DIAGNOSIS — K592 Neurogenic bowel, not elsewhere classified: Secondary | ICD-10-CM

## 2019-07-07 DIAGNOSIS — K760 Fatty (change of) liver, not elsewhere classified: Secondary | ICD-10-CM | POA: Diagnosis present

## 2019-07-07 DIAGNOSIS — Z981 Arthrodesis status: Secondary | ICD-10-CM | POA: Diagnosis not present

## 2019-07-07 DIAGNOSIS — R0681 Apnea, not elsewhere classified: Secondary | ICD-10-CM | POA: Diagnosis not present

## 2019-07-07 DIAGNOSIS — R7401 Elevation of levels of liver transaminase levels: Secondary | ICD-10-CM

## 2019-07-07 LAB — BASIC METABOLIC PANEL
Anion gap: 8 (ref 5–15)
BUN: 6 mg/dL (ref 6–20)
CO2: 23 mmol/L (ref 22–32)
Calcium: 8.6 mg/dL — ABNORMAL LOW (ref 8.9–10.3)
Chloride: 98 mmol/L (ref 98–111)
Creatinine, Ser: 0.72 mg/dL (ref 0.61–1.24)
GFR calc Af Amer: >60 mL/min (ref >60)
GFR calc non Af Amer: >60 mL/min (ref >60)
Glucose, Bld: 122 mg/dL — ABNORMAL HIGH (ref 70–99)
Potassium: 4 mmol/L (ref 3.5–5.1)
Sodium: 129 mmol/L — ABNORMAL LOW (ref 135–145)

## 2019-07-07 LAB — PROTEIN ELECTROPHORESIS, SERUM
A/G Ratio: 0.4 — ABNORMAL LOW (ref 0.7–1.7)
Albumin ELP: 2.5 g/dL — ABNORMAL LOW (ref 2.9–4.4)
Alpha-1-Globulin: 0.3 g/dL (ref 0.0–0.4)
Alpha-2-Globulin: 0.8 g/dL (ref 0.4–1.0)
Beta Globulin: 0.9 g/dL (ref 0.7–1.3)
Gamma Globulin: 3.9 g/dL — ABNORMAL HIGH (ref 0.4–1.8)
Globulin, Total: 5.9 g/dL — ABNORMAL HIGH (ref 2.2–3.9)
Total Protein ELP: 8.4 g/dL (ref 6.0–8.5)

## 2019-07-07 LAB — BRAIN NATRIURETIC PEPTIDE: B Natriuretic Peptide: 27.8 pg/mL (ref 0.0–100.0)

## 2019-07-07 LAB — MAGNESIUM: Magnesium: 1.7 mg/dL (ref 1.7–2.4)

## 2019-07-07 MED ORDER — DIPHENHYDRAMINE HCL 12.5 MG/5ML PO ELIX
12.5000 mg | ORAL_SOLUTION | Freq: Four times a day (QID) | ORAL | Status: DC | PRN
  Administered 2019-07-10: 25 mg via ORAL
  Filled 2019-07-07: qty 10

## 2019-07-07 MED ORDER — GABAPENTIN 600 MG PO TABS
300.0000 mg | ORAL_TABLET | Freq: Three times a day (TID) | ORAL | Status: DC
  Administered 2019-07-07 – 2019-07-08 (×2): 300 mg via ORAL
  Filled 2019-07-07 (×2): qty 1

## 2019-07-07 MED ORDER — PROCHLORPERAZINE 25 MG RE SUPP: 12.5000 mg | Freq: Four times a day (QID) | RECTAL | Status: DC | PRN

## 2019-07-07 MED ORDER — PROCHLORPERAZINE EDISYLATE 10 MG/2ML IJ SOLN: 5.0000 mg | Freq: Four times a day (QID) | INTRAMUSCULAR | Status: DC | PRN

## 2019-07-07 MED ORDER — ALUM & MAG HYDROXIDE-SIMETH 200-200-20 MG/5ML PO SUSP: 30.0000 mL | ORAL | Status: DC | PRN

## 2019-07-07 MED ORDER — DIPHENHYDRAMINE HCL 12.5 MG/5ML PO ELIX
12.5000 mg | ORAL_SOLUTION | Freq: Once | ORAL | Status: AC
  Administered 2019-07-07: 12.5 mg via ORAL
  Filled 2019-07-07: qty 5

## 2019-07-07 MED ORDER — THIAMINE HCL 100 MG PO TABS
100.0000 mg | ORAL_TABLET | Freq: Every day | ORAL | Status: DC
  Administered 2019-07-07: 100 mg via ORAL
  Filled 2019-07-07: qty 1

## 2019-07-07 MED ORDER — BISACODYL 10 MG RE SUPP
10.0000 mg | Freq: Every day | RECTAL | Status: DC | PRN
  Administered 2019-07-07 – 2019-07-12 (×2): 10 mg via RECTAL
  Filled 2019-07-07 (×3): qty 1

## 2019-07-07 MED ORDER — GUAIFENESIN-DM 100-10 MG/5ML PO SYRP: 5.0000 mL | ORAL_SOLUTION | Freq: Four times a day (QID) | ORAL | Status: DC | PRN

## 2019-07-07 MED ORDER — HEPARIN SODIUM (PORCINE) 5000 UNIT/ML IJ SOLN
5000.0000 [IU] | Freq: Three times a day (TID) | INTRAMUSCULAR | Status: DC
  Administered 2019-07-07 – 2019-07-08 (×2): 5000 [IU] via SUBCUTANEOUS
  Filled 2019-07-07 (×2): qty 1

## 2019-07-07 MED ORDER — ONDANSETRON HCL 4 MG/2ML IJ SOLN: 4.0000 mg | Freq: Four times a day (QID) | INTRAMUSCULAR | Status: DC | PRN

## 2019-07-07 MED ORDER — FLEET ENEMA 7-19 GM/118ML RE ENEM: 1.0000 | ENEMA | Freq: Once | RECTAL | Status: DC | PRN

## 2019-07-07 MED ORDER — METOPROLOL TARTRATE 50 MG PO TABS
100.0000 mg | ORAL_TABLET | Freq: Two times a day (BID) | ORAL | Status: DC
  Administered 2019-07-07 – 2019-08-02 (×46): 100 mg via ORAL
  Filled 2019-07-07 (×54): qty 2

## 2019-07-07 MED ORDER — SODIUM CHLORIDE 1 G PO TABS
1.0000 g | ORAL_TABLET | Freq: Two times a day (BID) | ORAL | Status: DC
  Administered 2019-07-08 – 2019-07-21 (×27): 1 g via ORAL
  Filled 2019-07-07 (×29): qty 1

## 2019-07-07 MED ORDER — ZOLPIDEM TARTRATE 5 MG PO TABS
5.0000 mg | ORAL_TABLET | Freq: Every evening | ORAL | Status: DC | PRN
  Administered 2019-07-08 – 2019-07-17 (×8): 5 mg via ORAL
  Filled 2019-07-07 (×10): qty 1

## 2019-07-07 MED ORDER — TRAZODONE HCL 50 MG PO TABS
25.0000 mg | ORAL_TABLET | Freq: Every evening | ORAL | Status: DC | PRN
  Administered 2019-07-19 – 2019-07-31 (×14): 50 mg via ORAL
  Filled 2019-07-07 (×15): qty 1

## 2019-07-07 MED ORDER — AMLODIPINE BESYLATE 10 MG PO TABS
10.0000 mg | ORAL_TABLET | Freq: Every day | ORAL | Status: DC
  Administered 2019-07-08 – 2019-07-11 (×4): 10 mg via ORAL
  Filled 2019-07-07 (×4): qty 1

## 2019-07-07 MED ORDER — METOPROLOL TARTRATE 100 MG PO TABS: 100.0000 mg | ORAL_TABLET | Freq: Two times a day (BID) | ORAL | Status: DC

## 2019-07-07 MED ORDER — FOLIC ACID 1 MG PO TABS: 1.0000 mg | ORAL_TABLET | Freq: Every day | ORAL | 0 refills | Status: DC

## 2019-07-07 MED ORDER — LIP MEDEX EX OINT
TOPICAL_OINTMENT | CUTANEOUS | Status: DC | PRN
  Filled 2019-07-07: qty 7

## 2019-07-07 MED ORDER — HYDRALAZINE HCL 50 MG PO TABS
100.0000 mg | ORAL_TABLET | Freq: Three times a day (TID) | ORAL | Status: DC
  Administered 2019-07-07 – 2019-07-25 (×43): 100 mg via ORAL
  Filled 2019-07-07 (×51): qty 2

## 2019-07-07 MED ORDER — POLYETHYLENE GLYCOL 3350 17 G PO PACK
17.0000 g | PACK | Freq: Every day | ORAL | Status: DC | PRN
  Administered 2019-07-11 – 2019-07-23 (×5): 17 g via ORAL
  Filled 2019-07-07 (×6): qty 1

## 2019-07-07 MED ORDER — MAGNESIUM OXIDE 400 (241.3 MG) MG PO TABS
400.0000 mg | ORAL_TABLET | Freq: Two times a day (BID) | ORAL | Status: DC
  Administered 2019-07-07 – 2019-08-02 (×52): 400 mg via ORAL
  Filled 2019-07-07 (×52): qty 1

## 2019-07-07 MED ORDER — ACETAMINOPHEN 325 MG PO TABS
325.0000 mg | ORAL_TABLET | ORAL | Status: DC | PRN
  Administered 2019-07-07 – 2019-08-01 (×19): 650 mg via ORAL
  Filled 2019-07-07 (×18): qty 2

## 2019-07-07 MED ORDER — NYSTATIN 100000 UNIT/ML MT SUSP
5.0000 mL | Freq: Four times a day (QID) | OROMUCOSAL | Status: DC
  Administered 2019-07-07 – 2019-07-25 (×71): 500000 [IU] via ORAL
  Filled 2019-07-07 (×71): qty 5

## 2019-07-07 MED ORDER — THIAMINE HCL 100 MG PO TABS: 100.0000 mg | ORAL_TABLET | Freq: Every day | ORAL | 0 refills | Status: DC

## 2019-07-07 MED ORDER — FOLIC ACID 1 MG PO TABS
1.0000 mg | ORAL_TABLET | Freq: Every day | ORAL | Status: DC
  Administered 2019-07-08 – 2019-08-02 (×26): 1 mg via ORAL
  Filled 2019-07-07 (×26): qty 1

## 2019-07-07 MED ORDER — PROCHLORPERAZINE MALEATE 5 MG PO TABS: 5.0000 mg | ORAL_TABLET | Freq: Four times a day (QID) | ORAL | Status: DC | PRN

## 2019-07-07 MED ORDER — THIAMINE HCL 100 MG PO TABS
100.0000 mg | ORAL_TABLET | Freq: Every day | ORAL | Status: DC
  Administered 2019-07-08 – 2019-08-02 (×26): 100 mg via ORAL
  Filled 2019-07-07 (×27): qty 1

## 2019-07-07 NOTE — Discharge Summary (Signed)
Jorge Weaver. FVC:[id number] DOB: September 19, 1965 DOA: 06/27/2019  PCP: Jorge Pal, DO  Admit date: 06/27/2019  Discharge date: 07/07/2019  Admitted From: Home  Disposition:  CIR   Recommendations for Outpatient Follow-up:   Follow up with PCP in 1-2 weeks  PCP Please obtain BMP/CBC, 2 view CXR in 1week,  (see Discharge instructions)   PCP Please follow up on the following pending results: Continue strict 1.5 L free water restriction, repeat BMP in 3 days.   Home Health: None  Equipment/Devices: None  Consultations: Neuro Discharge Condition: Stable    CODE STATUS: Full    Diet Recommendation: Heart Healthy strict 1.5 L total fluid restriction per day   Chief Complaint  Patient presents with  . Weakness     Brief history of present illness from the day of admission and additional interim summary    54 y.o.malewithhistory of hypertension previous alcohol abuse which patient states has stopped drinking for almost a month now who has had a surgery in October 2024 cervical 6 corpectomy and C5 7 arthrodesis by Jorge Weaver presents to the ER because of increasing tightness of the extremities difficulty to move difficulty walking and increasing numbness of the upper and lower extremities. Denies any nausea vomiting diarrhea headache chest pain. Has been having recurrent falls.  In the ER his MRI brain and C-spine were nonacute, this was discussed by the admitting physician with neurologist on-call at night Jorge Weaver, in the morning I discussed his MRI findings with neurosurgeon Jorge Weaver.  No surgical intervention or medical intervention needed according to the specialist.  Incidentally he was found to have life-threatening low severe hypokalemia along with ongoing weakness and admitted to the hospital.                                                               Hospital Course    1.  Generalized subacute weakness worse in the left upper extremity and left side of the face ongoing for several weeks in a patient with history of C-spine fusion surgery.  Case discussed by admitting physician by me with neurosurgeon Jorge Weaver, no surgical indication now.   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    Has shown moderate improvement in diffuse weakness, left-sided facial droop and tingling numbness which is also diffuse, will continue PT OT and will be placed to CIR on 07/07/2019 if they have a bed..   2.  Multiple falls at home with lip and  tongue injury during a fall along with avulsed right big toe nail.  Due to muscular weakness from #1 above, continue PT OT at CIR.  3. HTN - stable on Lopressor and Norvasc combination.  4.  History of alcohol abuse.  Counseled to quit.  Placed on folic acid and thiamine.  Last drink 1 month ago.  No signs of DTs.  5.    Persistent hypokalemia.  Continue chronic home dose potassium replacement, check BMP in 3 days and then intermittently.  6.  Hyponatremia.  Studies suggestive of SIADH, with hypoosmolality in the serum, urine osmolality greater than serum osmolality and certainly greater than 100, low serum uric acid, he was placed on fluid restriction, received Lasix and Samsca however surprisingly his sodium dropped thereafter subsequently he was treated with IV fluids for 2-1/2 L with improvement in sodium, continue normal saline 100 cc an hour for another 12 hours, continue strict 1.5 L total fluid  restriction per day, avoid free water and ice use Gatorade instead.  Repeat BMP in 3 days.  Case was discussed with nephrologist Dr. Marisue Humble.   Discharge diagnosis     Principal Problem:   Weakness Active Problems:   Essential hypertension   ETOH abuse   Prediabetes   Hypokalemia    Discharge instructions    Discharge Instructions    Discharge instructions   Complete by: As directed    Follow with Primary MD Jorge Dory, DO in 7 days   Get CBC, BMP checked next visit within 1 week by Primary MD or SNF MD   Activity: As tolerated with Full fall precautions use walker/cane & assistance as needed  Disposition CIR  Diet: Heart Healthy with strict 1.5lit/day fluid restriction  Special Instructions: If you have smoked or chewed Tobacco  in the last 2 yrs please stop smoking, stop any regular Alcohol  and or any Recreational drug use.  On your next visit with your primary care physician please Get Medicines reviewed and adjusted.  Please request your Prim.MD to go over all Hospital Tests and Procedure/Radiological results at the follow up, please get all Hospital records sent to your Prim MD by signing hospital release before you go home.  If you experience worsening of your admission symptoms, develop shortness of breath, life threatening emergency, suicidal or homicidal thoughts you must seek medical attention immediately by calling 911 or calling your MD immediately  if symptoms less severe.  You Must read complete instructions/literature along with all the possible adverse reactions/side effects for all the Medicines you take and that have been prescribed to you. Take any new Medicines after you have completely understood and accpet all the possible adverse reactions/side effects.   Increase activity slowly   Complete by: As directed       Discharge Medications   Allergies as of 07/07/2019   No Known Allergies     Medication List    STOP taking these  medications   metoprolol succinate 50 MG 24 hr tablet Commonly known as: TOPROL-XL     TAKE these medications   acetaminophen 500 MG tablet Commonly known as: TYLENOL Take 1,000 mg by mouth See admin instructions. Take 1,000 mg by mouth two to five times a day as needed for pain   amLODipine 10 MG tablet Commonly known as: NORVASC Take 1 tablet (10 mg total) by mouth daily.   famotidine 20 MG tablet Commonly known as: Pepcid Take 1 tablet (20 mg total) by mouth 2 (two) times daily.  folic acid 1 MG tablet Commonly known as: FOLVITE Take 1 tablet (1 mg total) by mouth daily.   levocetirizine 5 MG tablet Commonly known as: XYZAL Take 1 tablet (5 mg total) by mouth every evening.   metoprolol tartrate 100 MG tablet Commonly known as: LOPRESSOR Take 1 tablet (100 mg total) by mouth 2 (two) times daily.   potassium chloride 10 MEQ tablet Commonly known as: Klor-Con 10 Take 2 tablets (20 mEq total) by mouth daily.   tamsulosin 0.4 MG Caps capsule Commonly known as: FLOMAX Take 0.4 mg by mouth.   thiamine 100 MG tablet Take 1 tablet (100 mg total) by mouth daily.         Major procedures and Radiology Reports - PLEASE review detailed and final reports thoroughly  -       MR Brain W and Wo Contrast  Result Date: 06/27/2019 CLINICAL DATA:  54 year old male with increasing neurologic deficits, involuntary twitches and spasms. Status post cervical spine ACDF on 01/16/2019. EXAM: MRI HEAD WITHOUT AND WITH CONTRAST TECHNIQUE: Multiplanar, multiecho pulse sequences of the brain and surrounding structures were obtained without and with intravenous contrast. CONTRAST:  10mL GADAVIST GADOBUTROL 1 MMOL/ML IV SOLN COMPARISON:  No prior head MRI.  Head CT 10/15/2013. FINDINGS: Brain: Cerebral volume is similar to that in 2015. No restricted diffusion to suggest acute infarction. No midline shift, mass effect, evidence of mass lesion, ventriculomegaly, extra-axial collection or acute  intracranial hemorrhage. Cervicomedullary junction and pituitary are within normal limits. Largely normal for age gray and white matter signal, minimal scattered nonspecific cerebral white matter T2 and FLAIR hyperintensity. No cortical encephalomalacia or chronic cerebral blood products. No abnormal enhancement identified. No dural thickening. Vascular: Major intracranial vascular flow voids are preserved. The major dural venous sinuses are enhancing and appear to be patent. Skull and upper cervical spine: Cervical spine details are reported separately. Visualized bone marrow signal is within normal limits. Sinuses/Orbits: Negative orbits. Paranasal sinuses and mastoids are stable and well pneumatized. Other: Scalp and face soft tissues appear negative. There is a tiny right nasopharyngeal retention cyst, inconsequential. IMPRESSION: No acute intracranial abnormality and largely unremarkable for age MRI appearance of the brain. Electronically Signed   By: Odessa FlemingH  Hall M.D.   On: 06/27/2019 22:32   MR Cervical Spine W or Wo Contrast  Result Date: 06/27/2019 CLINICAL DATA:  54 year old male with increasing neurologic deficits, involuntary twitches and spasms. Status post cervical spine ACDF on 01/16/2019. EXAM: MRI CERVICAL SPINE WITHOUT AND WITH CONTRAST TECHNIQUE: Multiplanar and multiecho pulse sequences of the cervical spine, to include the craniocervical junction and cervicothoracic junction, were obtained without and with intravenous contrast. CONTRAST:  10mL GADAVIST GADOBUTROL 1 MMOL/ML IV SOLN COMPARISON:  Preoperative cervical spine MRI with contrast on 01/16/2019. And outside preoperative cervical spine without contrast 01/02/2019, now available on [employer] [employer] PACS. FINDINGS: Alignment: Straightening of cervical lordosis, less reversal compared to October. There may be mild residual retrolisthesis of C5 on C6. Vertebrae: Mild hardware susceptibility artifact from C5-C6 and C6-C7 ACDF, new from prior MRI. No  marrow edema or evidence of acute osseous abnormality. Background bone marrow signal within normal limits. Cord: The spinal cord remains abnormal at C6-C7, although the extent of abnormal cord T2 signal is regressed compared to 01/02/2019, where the T2 abnormality continued to the C7-T1 level. Patchy abnormal T2 signal is now confined to the C6-C7 level, and on axial images appears to spare only the most peripheral cord white matter (series 9, image 33). The cord  also appears less expanded. And following contrast there is only a faint 5 mm focus of residual enhancement (9 mm and more intensely enhancing previously). Above and below that level cord signal appears to remain normal. No other abnormal intradural enhancement. No dural thickening. Posterior Fossa, vertebral arteries, paraspinal tissues: Cervicomedullary junction is within normal limits. Preserved major vascular flow voids in the neck. Negative visible neck soft tissues. Disc levels: There is a congenital degree of cervical spinal canal narrowing related to short pedicles, with superimposed degenerative changes: C2-C3: Mild disc bulge and endplate spurring plus ligament flavum hypertrophy. Mild spinal stenosis but no cord mass effect (series 10, image 6). C3-C4: Mild disc bulge and ligament flavum hypertrophy. Mild spinal stenosis but no cord mass effect. C4-C5: Circumferential disc bulge and mild endplate spurring. Less ligament flavum hypertrophy here. Mild facet hypertrophy. Mild spinal stenosis. No cord mass effect. Mild to moderate C5 foraminal stenosis greater on the left. C5-C6: Interval ACDF. But there does appear to be residual spinal stenosis with mild cord mass effect at the C5 inferior endplate level (series 11, image 27). No cord signal abnormality. Endplate spurring results in severe left and mild right C6 foraminal stenosis (same image). C6-C7: Interval ACDF. Ligament flavum hypertrophy but no significant spinal stenosis. Foraminal patency  also appears improved, mild residual stenosis on the right. C7-T1: Mostly foraminal disc bulge and endplate spurring. No spinal stenosis. Moderate left and mild right C8 foraminal stenosis. No upper thoracic spinal stenosis. IMPRESSION: 1. 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 2. Status post ACDF at C5-C6 and C6-C7. Resolved spinal stenosis at C6-C7, and regressed although not resolved abnormal spinal cord signal and enhancement centered at that level. 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 3. Mild cervical spinal stenosis elsewhere, including some residual cord mass effect related to C5 endplate spurring, but no other abnormal cord signal is identified. 4. Degenerative neural foraminal stenosis, up to moderate at the left C5 and C8 nerve levels, and severe at the left C6 nerve level. Preliminary results of this exam were discussed by telephone with Dr. [place] Sheldon in the ED. Electronically Signed   By: Odessa Fleming M.D.   On: 06/27/2019 22:47   MR THORACIC SPINE W WO CONTRAST  Result Date: 06/30/2019 CLINICAL DATA:  A shin initial evaluation for 1 month history of progressive weakness. EXAM: MRI THORACIC WITHOUT AND WITH CONTRAST TECHNIQUE: Multiplanar and multiecho pulse sequences of the thoracic spine were obtained without and with intravenous contrast. CONTRAST:  10mL GADAVIST GADOBUTROL 1 MMOL/ML IV SOLN COMPARISON:  Prior MRI from 01/02/2019. FINDINGS: MRI THORACIC SPINE FINDINGS Alignment: Examination moderately degraded by motion artifact. Mild dextroscoliosis. Alignment otherwise normal with preservation of the normal thoracic kyphosis. No listhesis. Vertebrae: Vertebral body height maintained without evidence for acute or chronic fracture. Bone marrow signal intensity within normal limits. Few scattered subcentimeter benign hemangiomata noted. No worrisome osseous lesions. No abnormal marrow edema or enhancement. Cord: No  definite cord signal abnormality seen on this motion degraded exam. Cord caliber and morphology within normal limits. No appreciable abnormal enhancement. Paraspinal and other soft tissues: Paraspinous soft tissues demonstrate no acute finding. Partially visualized lungs are clear. Exophytic T2 hyperintense simple cyst partially visualized extending from the posterior right kidney. Visualized visceral structures otherwise unremarkable. Disc levels: T1-2:  Unremarkable. T2-3: Negative interspace. Left-sided facet hypertrophy. No spinal stenosis. Mild left foraminal narrowing. T3-4: Diffuse disc bulge with superimposed left foraminal disc protrusion (series 19, image 8). Right greater than  left facet hypertrophy. Resultant mild spinal stenosis without cord deformity. Mild left foraminal narrowing. T4-5: Mild disc bulge. Posterior element hypertrophy. No significant spinal stenosis. Foramina remain patent. T5-6: Tiny right paracentral disc protrusion (series 19, image 14). Posterior element hypertrophy. Mild prominence of the dorsal epidural fat. No significant spinal stenosis. Foramina remain patent. T6-7: Diffuse disc bulge. Superimposed small left paracentral disc protrusion mildly indents the left ventral thecal sac (series 19, image 17). Posterior element hypertrophy. Prominence of the dorsal epidural fat. No significant stenosis. T7-8: Prominence of the dorsal epidural fat with mild facet hypertrophy. Otherwise negative. No stenosis. T8-9: Minimal disc bulge. Posterior element hypertrophy with prominence of the dorsal epidural fat. No stenosis. T9-10: Mild diffuse disc bulge. Moderate bilateral facet hypertrophy. Resultant mild spinal stenosis. Moderate left with mild right foraminal narrowing. T10-11: Diffuse disc bulge. Right greater than left facet hypertrophy. Resultant mild to moderate spinal stenosis (series 19, image 29). Mild left with moderate right foraminal narrowing. T11-12: Minimal disc bulge.  Bilateral facet hypertrophy. No significant stenosis. T12-L1: Mild facet hypertrophy. Otherwise unremarkable. No stenosis. IMPRESSION: 1. Technically limited exam due to motion artifact. 2. Grossly normal MRI appearance of the thoracic cord with no cord signal abnormality identified. No acute abnormality or findings to explain patient's symptoms identified. 3. Multilevel degenerative disc bulging with facet hypertrophy as above. Resultant mild to moderate spinal stenosis at T9-10 and T10-11. Electronically Signed   By: Rise Mu M.D.   On: 06/30/2019 05:23   MR Lumbar Spine W Wo Contrast  Result Date: 06/27/2019 CLINICAL DATA:  54 year old male with increasing neurologic deficits, involuntary twitches and spasms. Status post cervical spine ACDF on 01/16/2019. EXAM: MRI LUMBAR SPINE WITHOUT AND WITH CONTRAST TECHNIQUE: Multiplanar and multiecho pulse sequences of the lumbar spine were obtained without and with intravenous contrast. CONTRAST:  16mL GADAVIST GADOBUTROL 1 MMOL/ML IV SOLN in conjunction with contrast enhanced imaging of the head and cervical spine reported separately. COMPARISON:  [facility] Lumbar MRIs 04/19/2019, 12/07/2018 FINDINGS: Segmentation:  Normal. Alignment: Stable degenerative appearing retrolisthesis at L5-S1, straightening of lumbar lordosis elsewhere. There is also mild chronic retrolisthesis of L2 on L3. Vertebrae: No marrow edema or evidence of acute osseous abnormality. Visualized bone marrow signal is within normal limits. Intact visible sacrum and SI joints. Conus medullaris and cauda equina: Conus extends to the T12-L1 level. No lower spinal cord or conus signal abnormality. No abnormal intradural enhancement. No dural thickening. Paraspinal and other soft tissues: Stable and negative; benign appearing right renal midpole cyst. Disc levels: Since 04/19/2019 lower thoracic and lumbar thecal sac patency appears mildly improved above L4-L5 due to  regression of epidural lipomatosis. At L2-L3 circumferential disc bulge with small posterior annular fissure and mild posterior element hypertrophy persist. Mild residual spinal stenosis at that level, improved from January. L4-L5: Persistent mild to moderate spinal stenosis in part due to epidural lipomatosis. Circumferential disc bulge, progressed right foraminal annular fissure since January (series 8 image 28). Moderate facet hypertrophy with degenerative facet joint fluid. No convincing lateral recess stenosis. Moderate right L4 foraminal stenosis. L5-S1: Continued epidural lipomatosis. Chronic retrolisthesis and disc space loss with circumferential disc osteophyte complex and moderate posterior element hypertrophy. Stable spinal and mild lateral recess stenosis. Stable moderate to severe left and moderate right L5 foraminal stenosis. IMPRESSION: 1. Lumbar thecal sac patency has improved since the January MRI at all levels above L4-L5, due to regressed epidural lipomatosis. 2. Chronic disc degeneration at L4-L5 has progressed in the form of  new right foraminal annular fissure. Moderate right L4 foraminal stenosis. Mild to moderate multifactorial spinal stenosis is stable. 3. Advanced chronic L5-S1 degeneration is stable with mild spinal and lateral recess stenosis, and moderate to severe L5 foraminal stenosis greater on the left. Electronically Signed   By: Odessa Fleming M.D.   On: 06/27/2019 22:54   DG Chest Port 1 View  Result Date: 07/04/2019 CLINICAL DATA:  Shortness of breath and cough. EXAM: PORTABLE CHEST 1 VIEW COMPARISON:  06/14/2018 FINDINGS: Lungs are adequately inflated as patient is slightly rotated to the left. There is no focal airspace consolidation or effusion. Borderline stable cardiomegaly. Partially visualized fusion hardware over the lower cervical spine. Remainder of the exam is unchanged. IMPRESSION: No active disease. Electronically Signed   By: Elberta Fortis M.D.   On: 07/04/2019 10:17     Micro Results     Recent Results (from the past 240 hour(s))  SARS CORONAVIRUS 2 (TAT 6-24 HRS) Nasopharyngeal Nasopharyngeal Swab     Status: None   Collection Time: 06/28/19 12:42 AM   Specimen: Nasopharyngeal Swab  Result Value Ref Range Status   SARS Coronavirus 2 NEGATIVE NEGATIVE Final    Comment: (NOTE) SARS-CoV-2 target nucleic acids are NOT DETECTED. The SARS-CoV-2 RNA is generally detectable in upper and lower respiratory specimens during the acute phase of infection. Negative results do not preclude SARS-CoV-2 infection, do not rule out co-infections with other pathogens, and should not be used as the sole basis for treatment or other patient management decisions. Negative results must be combined with clinical observations, patient history, and epidemiological information. The expected result is Negative. Fact Sheet for Patients: HairSlick.no Fact Sheet for Healthcare Providers: quierodirigir.com This test is not yet approved or cleared by the [place] FDA and  has been authorized for detection and/or diagnosis of SARS-CoV-2 by FDA under an Emergency Use Authorization (EUA). This EUA will remain  in effect (meaning this test can be used) for the duration of the COVID-19 declaration under Section 56 4(b)(1) of the Act, 21 U.S.C. section 360bbb-3(b)(1), unless the authorization is terminated or revoked sooner. Performed at [facility], [address] [place], [place] [address]   Culture, blood (routine x 2)     Status: None (Preliminary result)   Collection Time: 07/03/19  8:27 PM   Specimen: BLOOD  Result Value Ref Range Status   Specimen Description BLOOD LEFT ANTECUBITAL  Final   Special Requests NONE  Final   Culture   Final    NO GROWTH 3 DAYS Performed at [facility], [address], [place], [place] [address]    Report Status PENDING  Incomplete  Culture, blood (routine x 2)      Status: None (Preliminary result)   Collection Time: 07/03/19  8:33 PM   Specimen: BLOOD  Result Value Ref Range Status   Specimen Description BLOOD LEFT ANTECUBITAL  Final   Special Requests   Final    BOTTLES DRAWN AEROBIC AND ANAEROBIC Blood Culture adequate volume   Culture   Final    NO GROWTH 3 DAYS Performed at [facility], [address] [place], [place] [address]    Report Status PENDING  Incomplete    Today   Subjective    Jorge Weaver today has no headache,no chest abdominal pain,no new weakness tingling or numbness, feels much better wants to go home today.     Objective   Blood pressure 129/86, pulse (!) 108, temperature 98.5 F (  36.9 C), temperature source Oral, resp. rate 16, height  (1.803 m), weight 112.4 kg, SpO2 97 %.   Intake/Output Summary (Last 24 hours) at 07/07/2019 0836 Last data filed at 07/06/2019 1740 Gross per 24 hour  Intake --  Output 400 ml  Net -400 ml    Exam  Awake Alert,   generalized weakness in all 4 extremities with strength 5/5 but somewhat weaker in the left arm as compared to other limbs but this is chronic according to patient, mild left-sided facial droop   [place].AT,PERRAL Supple Neck,No JVD, No cervical lymphadenopathy appriciated.  Symmetrical Chest wall movement, Good air movement bilaterally, CTAB RRR,No Gallops, Rubs or new Murmurs, No Parasternal Heave +ve B.Sounds, Abd Soft, No tenderness, No organomegaly appriciated, No rebound - guarding or rigidity. No Cyanosis, Clubbing or edema, No new Rash or bruise   Data Review   CBC w Diff:  Lab Results  Component Value Date   WBC 7.4 07/05/2019   HGB 10.4 (L) 07/05/2019   HCT 31.4 (L) 07/05/2019   PLT 309 07/05/2019   LYMPHOPCT 20 07/05/2019   MONOPCT 14 07/05/2019   EOSPCT 0 07/05/2019   BASOPCT 0 07/05/2019    CMP:  Lab Results  Component Value Date   NA 129 (L) 07/07/2019   K 4.0 07/07/2019   CL 98 07/07/2019   CO2 23 07/07/2019   BUN 6  07/07/2019   CREATININE 0.72 07/07/2019   PROT 9.3 (H) 07/05/2019   ALBUMIN 2.3 (L) 07/05/2019   BILITOT 0.6 07/05/2019   ALKPHOS 59 07/05/2019   AST 41 07/05/2019   ALT 25 07/05/2019  .   Total Time in preparing paper work, data evaluation and todays exam - 35 minutes  Susa Raring M.D on 07/07/2019 at 8:36 AM  [facility]   Office  [phone]

## 2019-07-07 NOTE — H&P (Signed)
Physical Medicine and Rehabilitation Admission H&P    Chief Complaint  Patient presents with  . Functional decline due to GBS    HPI: Jorge Weaver is a 54 year old male with history of HTN, cervical spondylosis with myelopathy s/p decompression 12/2018, ETOH use who was admitted on 06/29/19 with increasing numbness in BUE/BLE progressing to umbilicus and face, recurrent falls and hesitancy. He was severely hypokalemic- K-2.2 at admission which was supplemented. MRI brain was negative for acute changes. MRI cervical spine showed ACDF C5-C7 with resolved stenosis and moderate neural foraminal stenosis C5 and C8 nerve roots, severe at L-C6 nerve level. MRI lumbar spine showed progression fo DD L4/L5 with new right foraminal annular fissure and moderate right L4 foraminal stenosis, advanced L5/S1 degeneration with moderate to severe L5 foraminal stenosis.  Dr. Maisie Fus consulted and felt that surgical intervention not needed.   Dr. Laurence Slate consulted for input and recommended full work up as well as IV thiamine due to history of alcohol abuse with evidence of macrocytic anemia and to rule out GBS as hypokalemia had improved without improvement in symptoms.  Labs for heavy metals and nutritional deficiency negative. RPR/HIV negative. LP done revealing albuminocytologic dissociation and was treated with IVIG X 5 days--completed on 4/16.   Blood cultures done 4/15 due to fevers and pending.  He developed hyponatremia with drop in Na-125 this weekend --was treated briefly with lasix, tolvaptan and IVF with improvement in sodium levels. Patient continues to be limited by neuropathy with weakness affecting functional status and CIR recommended due to functional decline.    Review of Systems  Constitutional: Negative for chills and fever.  HENT: Negative for hearing loss and tinnitus.        Lower lip sore due to tendency to bite it.   Eyes: Negative for blurred vision and double vision.  Respiratory:  Negative for cough and stridor.   Cardiovascular: Negative for chest pain, claudication and leg swelling.  Gastrointestinal: Positive for constipation. Negative for heartburn and nausea.  Genitourinary: Negative for dysuria and urgency.  Musculoskeletal: Positive for joint pain. Negative for myalgias and neck pain.  Neurological: Positive for sensory change (numbness is getting better), speech change and weakness. Negative for dizziness and headaches.  Psychiatric/Behavioral: The patient is not nervous/anxious and does not have insomnia.       Past Medical History:  Diagnosis Date  . Anemia    low iron  . Asthma    as a child  . Cervical spondylosis with myelopathy   . Fatty liver   . GERD (gastroesophageal reflux disease)   . Heart murmur    when he was younger   . Hypertension   . Pneumonia     Past Surgical History:  Procedure Laterality Date  . ANTERIOR CERVICAL DECOMP/DISCECTOMY FUSION N/A 01/16/2019   Procedure: Cervical six corpectomy with Cervical five-seven fusion and plating;  Surgeon: Coletta Memos, MD;  Location: [facility] OR;  Service: Neurosurgery;  Laterality: N/A;  . NO PAST SURGERIES    . WISDOM TOOTH EXTRACTION    . WRIST SURGERY Left 2014   fx    Family History  Problem Relation Age of Onset  . Hypertension Mother   . Heart attack Father   . Colon cancer Sister   . Esophageal cancer Neg Hx   . Rectal cancer Neg Hx   . Stomach cancer Neg Hx     Social History:  Married. Used to work as a Teaching laboratory technician.  Mainreports that he  has never smoked. He has never used smokeless tobacco. He reports current alcohol use of about 3.0 standard drinks of alcohol per week. He reports that he does not use drugs.    Allergies: No Known Allergies    Medications Prior to Admission  Medication Sig Dispense Refill  . acetaminophen (TYLENOL) 500 MG tablet Take 1,000 mg by mouth See admin instructions. Take 1,000 mg by mouth two to five times a day as needed for pain     . amLODipine (NORVASC) 10 MG tablet Take 1 tablet (10 mg total) by mouth daily. 90 tablet 3  . famotidine (PEPCID) 20 MG tablet Take 1 tablet (20 mg total) by mouth 2 (two) times daily. (Patient not taking: Reported on 06/27/2019) 60 tablet 2  . folic acid (FOLVITE) 1 MG tablet Take 1 tablet (1 mg total) by mouth daily. 30 tablet 0  . levocetirizine (XYZAL) 5 MG tablet Take 1 tablet (5 mg total) by mouth every evening. (Patient not taking: Reported on 06/27/2019) 30 tablet 2  . metoprolol tartrate (LOPRESSOR) 100 MG tablet Take 1 tablet (100 mg total) by mouth 2 (two) times daily.    . potassium chloride (KLOR-CON 10) 10 MEQ tablet Take 2 tablets (20 mEq total) by mouth daily. (Patient not taking: Reported on 06/27/2019) 180 tablet 0  . tamsulosin (FLOMAX) 0.4 MG CAPS capsule Take 0.4 mg by mouth.    . thiamine 100 MG tablet Take 1 tablet (100 mg total) by mouth daily. 30 tablet 0    Drug Regimen Review  Drug regimen was reviewed and remains appropriate with no significant issues identified  Home: Home Living Family/patient expects to be discharged to:: Private residence Living Arrangements: Spouse/significant other Available Help at Discharge: Family Type of Home: House Home Access: Stairs to enter [employer] of Steps: slight step to get onto porch Entrance Stairs-Rails: None Home Layout: (12 steps inside house with no rails) Bathroom Shower/Tub: Engineer, manufacturing systems: Standard Bathroom Accessibility: Yes Home Equipment: Environmental consultant - 2 wheels Additional Comments: Per wife, pt tries not to use walker or cane  Lives With: Spouse   Functional History: Prior Function Level of Independence: Independent Comments: working doing Mudlogger work  Functional Status:  Mobility: Bed Mobility Overal bed mobility: Needs Assistance Bed Mobility: Rolling, Sidelying to Sit Rolling: Min assist Sidelying to sit: Min assist Supine to sit: Min assist General bed  mobility comments: assisted pt to come forward up via R elbow. Transfers Overall transfer level: Needs assistance Equipment used: Rolling walker (2 wheeled) Transfers: Sit to/from Stand, [employer] Transfers Sit to Stand: Mod assist, +2 physical assistance Stand pivot transfers: Mod assist, +2 physical assistance General transfer comment: Cues needed to use hands to push off surface leaving even though painful.  Pt with L knee buckle when up requring mod assist x2 to recover. Ambulation/Gait Ambulation/Gait assistance: Mod assist, +2 physical assistance, +2 safety/equipment Gait Distance (Feet): 15 Feet(x2) Assistive device: Rolling walker (2 wheeled) Gait Pattern/deviations: Step-to pattern, Step-through pattern, Decreased step length - left, Decreased step length - right, Decreased stance time - left, Decreased stride length General Gait Details: uncoordinated steps bil, but pt able to keep and appropriate BOS, keep each knee extended during stance, though unable to unlock knee for coordinated heel/toe pattern. Gait velocity interpretation: <1.31 ft/sec, indicative of household ambulator  ADL: ADL Overall ADL's : Needs assistance/impaired Eating/Feeding: Set up, Sitting Eating/Feeding Details (indicate cue type and reason): Pt feeding self finger foods without assist and drinking from large mug  with handle without assist. pt continues to require assist cutting food.  Ataxia makes keeping food on fork difficult. Grooming: Oral care, Wash/dry face, Minimal assistance, Sitting Grooming Details (indicate cue type and reason): opening and closing small containers is difficult.  Discussed other options such as large toothpaste pump, larger toothbrush etc.   Upper Body Bathing: Minimal assistance, Sitting Lower Body Bathing: Maximal assistance, +2 for physical assistance, Sit to/from stand Upper Body Dressing : Minimal assistance, Sitting Upper Body Dressing Details (indicate cue type and  reason): donning new gown Lower Body Dressing: Maximal assistance, +2 for physical assistance, Sit to/from stand Lower Body Dressing Details (indicate cue type and reason): Pt unable to sit in figure 4 to cross legs to donn socks and reaching forward was difficult today.  pt transferred sit to stand with mod assist x2 .  Once up pt cannot let go of walker on one side to assist with adls in standing.  Toilet Transfer: Minimal assistance, +2 for physical assistance, RW Toilet Transfer Details (indicate cue type and reason): Pt did have L knee buckle during transfer.  Assist provided from therapist to remain standing. pt requring cues to reach back to [facility] before sitting. Toileting- Clothing Manipulation and Hygiene: Moderate assistance, Sit to/from stand Functional mobility during ADLs: Moderate assistance, +2 for physical assistance General ADL Comments: Pt limited with adls in standing due to poor leg strength and ataxia and adls limited in sitting due to UE burning sensation and poor coordination.  Cognition: Cognition Overall Cognitive Status: Within Functional Limits for tasks assessed Orientation Level: Oriented X4 Cognition Arousal/Alertness: Awake/alert Behavior During Therapy: WFL for tasks assessed/performed Overall Cognitive Status: Within Functional Limits for tasks assessed General Comments: Pt very frustrated with current status. Works hard. Motivated.   Blood pressure (!) 139/104, pulse 84, temperature 98.7 F (37.1 C), temperature source Oral, resp. rate 20, height 5\' 11"  (1.803 m), weight 115.5 kg, SpO2 99 %.   General: Alert and oriented x 3, No apparent distress HEENT: Head is normocephalic, atraumatic, PERRLA, EOMI, sclera anicteric, oral mucosa pink and moist, dentition intact, ext ear canals clear,  Neck: Supple without JVD or lymphadenopathy Heart: Reg rate and rhythm. No murmurs rubs or gallops Chest: CTA bilaterally without wheezes, rales, or rhonchi; no  distress Abdomen: Soft, non-tender, non-distended, bowel sounds positive. Extremities: No clubbing, cyanosis, or edema. Pulses are 2+ Skin: Small scabs left lower lip.   Neuro/Musculoskeletal: Right facial weakness with severe dysarthria. Has problems moving tongue and closing mouth. Reporting severe perioral numbness with soreness lower lip.  4+/5 strength throughout. Ataxia. AOx3. Able to spell WORLD backwards on 2nd try. Intact repetition. 1/3 delayed recall, 2/3 with cues Psych: Pt's affect is appropriate. Pt is cooperative   Results for orders placed or performed during the hospital encounter of 06/27/19 (from the past 48 hour(s))  Basic metabolic panel     Status: Abnormal   Collection Time: 07/05/19  8:54 PM  Result Value Ref Range   Sodium 125 (L) 135 - 145 mmol/L   Potassium 4.2 3.5 - 5.1 mmol/L   Chloride 96 (L) 98 - 111 mmol/L   CO2 23 22 - 32 mmol/L   Glucose, Bld 125 (H) 70 - 99 mg/dL    Comment: Glucose reference range applies only to samples taken after fasting for at least 8 hours.   BUN 11 6 - 20 mg/dL   Creatinine, Ser 0.72 0.61 - 1.24 mg/dL   Calcium 8.5 (L) 8.9 - 10.3 mg/dL   GFR  calc non Af Amer >60 >60 mL/min   GFR calc Af Amer >60 >60 mL/min   Anion gap 6 5 - 15    Comment: Performed at [facility], [address], [place], [place] [address]  Procalcitonin     Status: None   Collection Time: 07/06/19  2:36 AM  Result Value Ref Range   Procalcitonin 0.41 ng/mL    Comment:        Interpretation: PCT (Procalcitonin) <= 0.5 ng/mL: Systemic infection (sepsis) is not likely. Local bacterial infection is possible. (NOTE)       Sepsis PCT Algorithm           Lower Respiratory Tract                                      Infection PCT Algorithm    ----------------------------     ----------------------------         PCT < 0.25 ng/mL                PCT < 0.10 ng/mL         Strongly encourage             Strongly discourage   discontinuation of antibiotics     initiation of antibiotics    ----------------------------     -----------------------------       PCT 0.25 - 0.50 ng/mL            PCT 0.10 - 0.25 ng/mL               OR       >80% decrease in PCT            Discourage initiation of                                            antibiotics      Encourage discontinuation           of antibiotics    ----------------------------     -----------------------------         PCT >= 0.50 ng/mL              PCT 0.26 - 0.50 ng/mL               AND        <80% decrease in PCT             Encourage initiation of                                             antibiotics       Encourage continuation           of antibiotics    ----------------------------     -----------------------------        PCT >= 0.50 ng/mL                  PCT > 0.50 ng/mL               AND         increase in PCT  Strongly encourage                                      initiation of antibiotics    Strongly encourage escalation           of antibiotics                                     -----------------------------                                           PCT <= 0.25 ng/mL                                                 OR                                        > 80% decrease in PCT                                     Discontinue / Do not initiate                                             antibiotics Performed at [facility], [address], [place], [place] [address]   Basic metabolic panel     Status: Abnormal   Collection Time: 07/06/19  2:36 AM  Result Value Ref Range   Sodium 126 (L) 135 - 145 mmol/L   Potassium 4.0 3.5 - 5.1 mmol/L   Chloride 96 (L) 98 - 111 mmol/L   CO2 24 22 - 32 mmol/L   Glucose, Bld 104 (H) 70 - 99 mg/dL    Comment: Glucose reference range applies only to samples taken after fasting for at least 8 hours.   BUN 9 6 - 20 mg/dL   Creatinine, Ser 6.04 0.61 - 1.24 mg/dL   Calcium 8.5 (L) 8.9 - 10.3 mg/dL   GFR calc non  Af Amer >60 >60 mL/min   GFR calc Af Amer >60 >60 mL/min   Anion gap 6 5 - 15    Comment: Performed at [facility], [address] [place], [place] [address]  Magnesium     Status: None   Collection Time: 07/06/19  2:36 AM  Result Value Ref Range   Magnesium 1.8 1.7 - 2.4 mg/dL    Comment: Performed at [facility], [address], [place], [place] [address]  Basic metabolic panel     Status: Abnormal   Collection Time: 07/06/19  1:50 PM  Result Value Ref Range   Sodium 128 (L) 135 - 145 mmol/L   Potassium 4.2 3.5 - 5.1 mmol/L   Chloride 97 (L) 98 - 111 mmol/L   CO2 24 22 - 32 mmol/L   Glucose, Bld  117 (H) 70 - 99 mg/dL    Comment: Glucose reference range applies only to samples taken after fasting for at least 8 hours.   BUN 6 6 - 20 mg/dL   Creatinine, Ser 5.78 0.61 - 1.24 mg/dL   Calcium 8.7 (L) 8.9 - 10.3 mg/dL   GFR calc non Af Amer >60 >60 mL/min   GFR calc Af Amer >60 >60 mL/min   Anion gap 7 5 - 15    Comment: Performed at [facility], [address] [place], [place] [address]  Basic metabolic panel     Status: Abnormal   Collection Time: 07/07/19  2:17 AM  Result Value Ref Range   Sodium 129 (L) 135 - 145 mmol/L   Potassium 4.0 3.5 - 5.1 mmol/L   Chloride 98 98 - 111 mmol/L   CO2 23 22 - 32 mmol/L   Glucose, Bld 122 (H) 70 - 99 mg/dL    Comment: Glucose reference range applies only to samples taken after fasting for at least 8 hours.   BUN 6 6 - 20 mg/dL   Creatinine, Ser 9.52 0.61 - 1.24 mg/dL   Calcium 8.6 (L) 8.9 - 10.3 mg/dL   GFR calc non Af Amer >60 >60 mL/min   GFR calc Af Amer >60 >60 mL/min   Anion gap 8 5 - 15    Comment: Performed at [facility], [address] [place], [place] [address]  Magnesium     Status: None   Collection Time: 07/07/19  2:17 AM  Result Value Ref Range   Magnesium 1.7 1.7 - 2.4 mg/dL    Comment: Performed at [facility], [address], [place], [place] [address]  Brain natriuretic peptide      Status: None   Collection Time: 07/07/19  2:17 AM  Result Value Ref Range   B Natriuretic Peptide 27.8 0.0 - 100.0 pg/mL    Comment: Performed at [facility], [address] [place], [place] [address]   No results found.  Medical Problem List and Plan: 1.  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 2.  Antithrombotics: -DVT/anticoagulation:  Pharmaceutical: Heparin  -antiplatelet therapy: N/a 3. Pain Management: Tylenol prn.  4. Mood: LCSW to follow for evaluation and support.   -antipsychotic agents: N/a 5. Neuropsych: This patient is capable of making decisions on his own behalf. 6. Skin/Wound Care: Routine pressure relief measures.  7. Fluids/Electrolytes/Nutrition: Monitor I/O. Check lytes in am--off fluid restriction now 8. HTN: Monitor BP tid--continue Hydralazine, metoprolol and amlodipine. Has been labile.  9. Hyponatremia: Likely due to GBS. Has received Tolvaptan, fluid restriction, and salt tablets. Slowly trending upward. 129 on 4/19.  10. H/o alcohol use: Continue Thiamine and Folic acid. boderline low Mg 1.7-1.8 range. Will add supplement.  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  12. Abnormal LFTs: Recheck in am.  13. Bladder incontinence:  14. Dysarthria: Severe this am with increase in right facial weakness. Discussed with Dr. Thedore Mins and Felicie Morn PA for neuro.  Benadryl recommended to help with edema. They report that speech does fluctuate on and off. NIF ordered X 2 per and neuro recommends formal consult in am if speech not improved.  15. Insomnia: Continue Ambien prn   Delle Reining, PA-C  I have personally performed a face  to face diagnostic evaluation, including, but not limited to relevant history and physical exam findings, of this patient and developed relevant assessment and plan.   Additionally, I have reviewed and concur with the physician assistant's documentation above.  The patient's status has not changed. The original post admission physician evaluation remains appropriate, and any changes from the pre-admission screening or documentation from the acute chart are noted above.   Sula SodaKrutika Elsye Mccollister, MD

## 2019-07-07 NOTE — PMR Pre-admission (Signed)
PMR Admission Coordinator Pre-Admission Assessment  Patient: Jorge Weaver. is an 54 y.o., male MRN: [id number] DOB: [birth year] Height: 5' 11"  (180.3 cm) Weight: 112.4 kg              Insurance Information HMO:    PPO: yes     PCP:      IPA:      80/20:      OTHER:  PRIMARY: [employer] [place]      Policy#: [id number]      Subscriber: Patient CM Name: faxed approval from Amy    Phone#: [phone]     Fax#: [phone] Pre-Cert#: [id number]   Effectvie dates 07/04/19 with clinical update required  by 07/14/19. Follow up CM: Amy (p): [phone]; (f): [phone]     Employer:  Benefits:  Phone #: [phone]     Name:  Eff. Date: 03/21/19-still active     Deduct: $2,000 ($0 met)      Out of Pocket Max: $6,000 (included deductible - $50 met)      Life Max:   CIR: 90% coverage, 10% co-insurance      SNF: 80% coverage, 20% co-insurance; with a limit of 60 days/cal year Outpatient: 80% coverage, 20% co-insurance; limited by medical necessity     Home Health: 80% coverage, 20% co-insurance; limited by medical necessity       DME: 80% coverage, 20% co-insurance      Providers: in-network SECONDARY:       Policy#:       Phone#:   Development worker, community:       Phone#:   The Engineer, petroleum" for patients in Inpatient Rehabilitation Facilities with attached "Privacy Act [place] Records" was provided and verbally reviewed with: N/A  Emergency Contact Information Contact Information    Name Relation Home Work Mobile   [place] Spouse [phone]  [phone]     Current Medical History  Patient Admitting Diagnosis: Guillain-Barre  History of Present Illness: Jorge Weaver is a 54 year old male with history of HTN, cervical spondylosis with myelopathy s/p decompression 12/2018, ETOH use who was admitted on 06/29/19 with increasing numbness in BUE/BLE progressing to umbilicus and face, recurrent falls and hesitancy. He was severely hypokalemic- K-2.2 at  admission which was supplemented. MRI brain was negative for acute changes. MRI cervical spine showed ACDF C5-C7 with resolved stenosis and moderate neural foraminal stenosis C5 and C8 nerve roots, severe at L-C6 nerve level. MRI lumbar spine showed progression fo DD L4/L5 with new right foraminal annular fissure and moderate right L4 foraminal stenosis, advanced L5/S1 degeneration with moderate to severe L5 foraminal stenosis.  Dr. [name] consulted and felt that surgical intervention not needed.   Dr. Lorraine Lax consulted for input and recommended full work up as well as IV thiamine due to history of alcohol abuse with evidence of macrocytic anemia and to rule out GBS as hypokalemia had improved without improvement in symptoms.  Labs for heavy metals and nutritional deficiency negative. RPR/HIV negative. LP done revealing albuminocytologic dissociation and was treated with IVIG X 5 days--completed on 4/16.   Blood cultures done 4/15 due to fevers and pending.  He developed hyponatremia with drop in Na-125 this weekend --was treated briefly with lasix, tolvaptan and IVF with improvement in sodium levels. Patient continues to be limited by neuropathy with weakness affecting functional status and will admit to CIR on 07/07/19 due to functional decline.    Glasgow Coma Scale Score: 15  Past Medical History  Past Medical History:  Diagnosis Date  . Anemia    low iron  . Asthma    as a child  . Cervical spondylosis with myelopathy   . Fatty liver   . GERD (gastroesophageal reflux disease)   . Heart murmur    when he was younger   . Hypertension   . Pneumonia     Family History  family history includes Colon cancer in his sister; Heart attack in his father; Hypertension in his mother.  Prior Rehab/Hospitalizations:  Has the patient had prior rehab or hospitalizations prior to admission? Yes  Has the patient had major surgery during 100 days prior to admission? No  Current Medications   Current  Facility-Administered Medications:  .  acetaminophen (TYLENOL) tablet 650 mg, 650 mg, Oral, PRN, Greta Doom, MD, 650 mg at 07/05/19 2119 .  amLODipine (NORVASC) tablet 10 mg, 10 mg, Oral, Daily, Rise Patience, MD, 10 mg at 07/06/19 0909 .  folic acid (FOLVITE) tablet 1 mg, 1 mg, Oral, Daily, Donnamae Jude, RPH, 1 mg at 07/06/19 6010 .  gabapentin (NEURONTIN) tablet 300 mg, 300 mg, Oral, TID, Lala Lund K, MD, 300 mg at 07/06/19 2144 .  heparin injection 5,000 Units, 5,000 Units, Subcutaneous, Q8H, Greta Doom, MD, 5,000 Units at 07/07/19 0523 .  hydrALAZINE (APRESOLINE) injection 10 mg, 10 mg, Intravenous, Q4H PRN, Rise Patience, MD, 10 mg at 06/28/19 9323 .  hydrALAZINE (APRESOLINE) tablet 100 mg, 100 mg, Oral, Q8H, Thurnell Lose, MD, 100 mg at 07/07/19 0523 .  lip balm (CARMEX) ointment, , Topical, PRN, Thurnell Lose, MD .  metoprolol tartrate (LOPRESSOR) tablet 100 mg, 100 mg, Oral, BID, Thurnell Lose, MD, 100 mg at 07/06/19 2143 .  nystatin (MYCOSTATIN) 100000 UNIT/ML suspension 500,000 Units, 5 mL, Oral, QID, Blount, Xenia T, NP, 500,000 Units at 07/06/19 2321 .  [DISCONTINUED] ondansetron (ZOFRAN) tablet 4 mg, 4 mg, Oral, Q6H PRN **OR** ondansetron (ZOFRAN) injection 4 mg, 4 mg, Intravenous, Q6H PRN, Rise Patience, MD .  sodium chloride tablet 1 g, 1 g, Oral, BID WC, Thurnell Lose, MD, 1 g at 07/07/19 0736 .  thiamine tablet 100 mg, 100 mg, Oral, Daily, Lala Lund K, MD .  zolpidem (AMBIEN) tablet 5 mg, 5 mg, Oral, QHS PRN, Thurnell Lose, MD, 5 mg at 07/05/19 2122  Patients Current Diet:  Diet Order            Diet Heart Room service appropriate? Yes; Fluid consistency: Thin  Diet effective now              Precautions / Restrictions Precautions Precautions: Fall Precaution Comments: ataxia Restrictions Weight Bearing Restrictions: No   Has the patient had 2 or more falls or a fall with injury in the past  year?Yes  Prior Activity Level Community (5-7x/wk): pt still working  Prior Functional Level Prior Function Level of Independence: Independent Comments: working doing Surveyor, quantity work  Self Care: Did the patient need help bathing, dressing, using the toilet or eating?  Independent  Indoor Mobility: Did the patient need assistance with walking from room to room (with or without device)? Independent  Stairs: Did the patient need assistance with internal or external stairs (with or without device)? Independent  Functional Cognition: Did the patient need help planning regular tasks such as shopping or remembering to take medications? [place] Devices/Equipment: Environmental consultant (specify type), Cane (specify quad or straight) Home Equipment: Walker - 2 wheels  Prior Device Use: Indicate devices/aids used by the patient prior to current illness, exacerbation or injury? None of the above  Current Functional Level Cognition  Overall Cognitive Status: Within Functional Limits for tasks assessed Orientation Level: Oriented X4 General Comments: Pt very frustrated with current status. Works hard. Motivated.    Extremity Assessment (includes Sensation/Coordination)  Upper Extremity Assessment: RUE deficits/detail, LUE deficits/detail RUE Deficits / Details: discoordinated/ataxic movements noted bil. Good strength throughout (grossly 4/5). Pt endorses tingling in bil UEs digits to shoulder. RUE Sensation: decreased light touch, decreased proprioception RUE Coordination: decreased fine motor, decreased gross motor LUE Deficits / Details: discoordinated/ataxic movements noted bil. Good strength throughout (grossly 4/5). Pt endorses tingling in bil UEs digits to shoulder. LUE Sensation: decreased light touch, decreased proprioception LUE Coordination: decreased fine motor, decreased gross motor  Lower Extremity Assessment: RLE  deficits/detail, LLE deficits/detail, Generalized weakness RLE Deficits / Details: ataxia on standing, less so v. left, noting full ROM but poor eccentric control RLE Sensation: decreased light touch(tingling) RLE Coordination: decreased gross motor LLE Deficits / Details: decreased ROM in knee, unable to acheive full extension and noting abrupt contractions with poor eccentric control, worse vs. rigth LLE Sensation: decreased light touch(tingling) LLE Coordination: decreased gross motor    ADLs  Overall ADL's : Needs assistance/impaired Eating/Feeding: Set up, Sitting Eating/Feeding Details (indicate cue type and reason): Pt feeding self finger foods without assist and drinking from large mug with handle without assist. pt continues to require assist cutting food.  Ataxia makes keeping food on fork difficult. Grooming: Oral care, Wash/dry face, Minimal assistance, Sitting Grooming Details (indicate cue type and reason): opening and closing small containers is difficult.  Discussed other options such as large toothpaste pump, larger toothbrush etc.   Upper Body Bathing: Minimal assistance, Sitting Lower Body Bathing: Maximal assistance, +2 for physical assistance, Sit to/from stand Upper Body Dressing : Minimal assistance, Sitting Upper Body Dressing Details (indicate cue type and reason): donning new gown Lower Body Dressing: Maximal assistance, +2 for physical assistance, Sit to/from stand Lower Body Dressing Details (indicate cue type and reason): Pt unable to sit in figure 4 to cross legs to donn socks and reaching forward was difficult today.  pt transferred sit to stand with mod assist x2 .  Once up pt cannot let go of walker on one side to assist with adls in standing.  Toilet Transfer: Minimal assistance, +2 for physical assistance, RW Toilet Transfer Details (indicate cue type and reason): Pt did have L knee buckle during transfer.  Assist provided from therapist to remain standing. pt  requring cues to reach back to [facility] before sitting. Toileting- Clothing Manipulation and Hygiene: Moderate assistance, Sit to/from stand Functional mobility during ADLs: Moderate assistance, +2 for physical assistance General ADL Comments: Pt limited with adls in standing due to poor leg strength and ataxia and adls limited in sitting due to UE burning sensation and poor coordination.    Mobility  Overal bed mobility: Needs Assistance Bed Mobility: Rolling, Sidelying to Sit Rolling: Min assist Sidelying to sit: Min assist Supine to sit: Min assist General bed mobility comments: assisted pt to come forward up via R elbow.    Transfers  Overall transfer level: Needs assistance Equipment used: Rolling walker (2 wheeled) Transfers: Sit to/from Stand, [employer] Transfers Sit to Stand: Mod assist, +2 physical assistance Stand pivot transfers: Mod assist, +2 physical assistance General transfer comment: Cues needed to use hands to push off surface leaving even though painful.  Pt with  L knee buckle when up requring mod assist x2 to recover.    Ambulation / Gait / Stairs / Wheelchair Mobility  Ambulation/Gait Ambulation/Gait assistance: Mod assist, +2 physical assistance, +2 safety/equipment Gait Distance (Feet): 15 Feet(x2) Assistive device: Rolling walker (2 wheeled) Gait Pattern/deviations: Step-to pattern, Step-through pattern, Decreased step length - left, Decreased step length - right, Decreased stance time - left, Decreased stride length General Gait Details: uncoordinated steps bil, but pt able to keep and appropriate BOS, keep each knee extended during stance, though unable to unlock knee for coordinated heel/toe pattern. Gait velocity interpretation: <1.31 ft/sec, indicative of household ambulator    Posture / Balance Dynamic Sitting Balance Sitting balance - Comments: can accept minimal challenge to balance with some truncal ataxia with reaction. Balance Overall balance  assessment: Needs assistance Sitting-balance support: Feet supported, No upper extremity supported, Single extremity supported, Bilateral upper extremity supported Sitting balance-Leahy Scale: Fair Sitting balance - Comments: can accept minimal challenge to balance with some truncal ataxia with reaction. Standing balance support: Bilateral upper extremity supported, During functional activity Standing balance-Leahy Scale: Poor Standing balance comment: Pt heavily reliant on walker to remain standing. Pt unable to safely let go of one side of walker to do adls with other hand without losing balance.    Special needs/care consideration Continuous Drip IV  IV fluids at 167m/hr, Skin excoriated (groin; right/left) and Designated visitor wife, PVentura Sellersand daughter. Incontinent bowel, bladder.     Previous Home Environment (from acute therapy documentation) Living Arrangements: Spouse/significant other  Lives With: Spouse Available Help at Discharge: Family Type of Home: House Home Layout: (12 steps inside house with no rails) Home Access: Stairs to enter Entrance Stairs-Rails: None Entrance Stairs-Number of Steps: slight step to get onto porch Bathroom Shower/Tub: TChiropodist Standard Bathroom Accessibility: Yes How Accessible: Accessible via walker Home Care Services: No Additional Comments: Per wife, pt tries not to use walker or cane  Discharge Living Setting Plans for Discharge Living Setting: Patient's home Type of Home at Discharge: House Discharge Home Layout: (has 12 steps inside home with no rails) Discharge Home Access: Stairs to enter(slight step to get onto porch) Entrance Stairs-Rails: None Entrance Stairs-Number of Steps: slight step to get onto porch Discharge Bathroom Shower/Tub: Tub/shower unit Discharge Bathroom Toilet: Standard Discharge Bathroom Accessibility: Yes How Accessible: Accessible via walker Does the patient have any problems  obtaining your medications?: No  Social/Family/Support Systems Patient Roles: Spouse Anticipated Caregiver: PNitish Roes pt's wife Anticipated CAmbulance personInformation: [phone] Availability: 24/7 Discharge Plan Discussed with Primary Caregiver: Yes Is Caregiver In Agreement with Plan?: Yes Does Caregiver/Family have Issues with Lodging/Transportation while Pt is in Rehab?: No   Goals Patient/Family Goal for Rehab: Supervision-PT,Min A-OT Expected length of stay: 14-19 days   Decrease burden of Care through IP rehab admission: NA   Possible need for SNF placement upon discharge: NA d/t reliable d/c plan and anticipation of pt reaching CIR goals   Patient Condition: This patient's medical and functional status has changed since the consult dated: 4/12/20211 in which the Rehabilitation Physician determined and documented that the patient's condition is appropriate for intensive rehabilitative care in an inpatient rehabilitation facility. See "History of Present Illness" (above) for medical update. Functional changes are: progression from no gait assessed by consult d/t weakness to Mod A +2 15 feet (x2) using RW.  Pt did decline from [place]uffiefor sit to stand transfers to Min A +2 and continues to require Min A to  Max A +2 for  ADLs.. Patient's medical and functional status update has been discussed with the Rehabilitation physician and patient remains appropriate for inpatient rehabilitation. Will admit to inpatient rehab today.  Preadmission Screen Completed By:  Bethel Born, CCC-SLP, 07/07/2019 10:35 AM ______________________________________________________________________   Discussed status with Dr. Ranell Patrick on 07/07/19 at 11:13 AM  and received approval for admission today.  Admission Coordinator:  Bethel Born, time 11:13 AM Sudie Grumbling  07/07/19

## 2019-07-07 NOTE — Progress Notes (Signed)
Saw pt at bedside. Daughter present.  Notified both that pt will be admitted to CIR today.  Reviewed consent form and daughter signed.  MD, TOC, NSG made aware of CIR admission.  Wolfgang Phoenix, MS, CCC-SLP Admissions Coordinator [phone]

## 2019-07-07 NOTE — Progress Notes (Signed)
Inpatient Rehabilitation  Patient information reviewed and entered into eRehab system by Symantha Steeber M. Ota Ebersole, M.A., CCC/SLP, PPS Coordinator.  Information including medical coding, functional ability and quality indicators will be reviewed and updated through discharge.    

## 2019-07-07 NOTE — Plan of Care (Signed)
Adequate for discharge to CIR.  

## 2019-07-07 NOTE — Progress Notes (Signed)
Jorge Chin, MD  Physician  Physical Medicine and Rehabilitation  H&P      Signed  Date of Service:  07/07/2019 10:23 AM      Related encounter: ED to Hosp-Admission (Current) from 06/27/2019 in [facility]      Signed      Expand AllCollapse All   Show:Clear all [x] Manual[x] Template[x] Copied  Added by: [x] Love, , PA-C[x] Raulkar, , MD  [] Hover for details      Physical Medicine and Rehabilitation Admission H&P        Chief Complaint  Patient presents with  . Functional decline due to GBS      HPI: Jorge Weaver is a 54 year old male with history of HTN, cervical spondylosis with myelopathy s/p decompression 12/2018, ETOH use who was admitted on 06/29/19 with increasing numbness in BUE/BLE progressing to umbilicus and face, recurrent falls and hesitancy. He was severely hypokalemic- K-2.2 at admission which was supplemented. MRI brain was negative for acute changes. MRI cervical spine showed ACDF C5-C7 with resolved stenosis and moderate neural foraminal stenosis C5 and C8 nerve roots, severe at L-C6 nerve level. MRI lumbar spine showed progression fo DD L4/L5 with new right foraminal annular fissure and moderate right L4 foraminal stenosis, advanced L5/S1 degeneration with moderate to severe L5 foraminal stenosis.  Dr. 53 consulted and felt that surgical intervention not needed.    Dr. 01/2019 consulted for input and recommended full work up as well as IV thiamine due to history of alcohol abuse with evidence of macrocytic anemia and to rule out GBS as hypokalemia had improved without improvement in symptoms.  Labs for heavy metals and nutritional deficiency negative. RPR/HIV negative. LP done revealing albuminocytologic dissociation and was treated with IVIG X 5 days--completed on 4/16.   Blood cultures done 4/15 due to fevers and pending.  He developed hyponatremia with drop in Na-125 this weekend --was treated briefly with lasix,  tolvaptan and IVF with improvement in sodium levels. Patient continues to be limited by neuropathy with weakness affecting functional status and CIR recommended due to functional decline.      Review of Systems  Constitutional: Negative for chills and fever.  HENT: Negative for hearing loss and tinnitus.        Lower lip sore due to tendency to bite it.   Eyes: Negative for blurred vision and double vision.  Respiratory: Negative for cough and stridor.   Cardiovascular: Negative for chest pain, claudication and leg swelling.  Gastrointestinal: Positive for constipation. Negative for heartburn and nausea.  Genitourinary: Negative for dysuria and urgency.  Musculoskeletal: Positive for joint pain. Negative for myalgias and neck pain.  Neurological: Positive for sensory change (numbness is getting better), speech change and weakness. Negative for dizziness and headaches.  Psychiatric/Behavioral: The patient is not nervous/anxious and does not have insomnia.         Past Medical History:  Diagnosis Date  . Anemia      low iron  . Asthma      as a child  . Cervical spondylosis with myelopathy    . Fatty liver    . GERD (gastroesophageal reflux disease)    . Heart murmur      when he was younger   . Hypertension    . Pneumonia             Past Surgical History:  Procedure Laterality Date  . ANTERIOR CERVICAL DECOMP/DISCECTOMY FUSION N/A 01/16/2019    Procedure: Cervical  six corpectomy with Cervical five-seven fusion and plating;  Surgeon: Coletta Memos, MD;  Location: [facility] OR;  Service: Neurosurgery;  Laterality: N/A;  . NO PAST SURGERIES      . WISDOM TOOTH EXTRACTION      . WRIST SURGERY Left 2014    fx           Family History  Problem Relation Age of Onset  . Hypertension Mother    . Heart attack Father    . Colon cancer Sister    . Esophageal cancer Neg Hx    . Rectal cancer Neg Hx    . Stomach cancer Neg Hx        Social History:  Married. Used to work as a  Teaching laboratory technician.  Mainreports that he has never smoked. He has never used smokeless tobacco. He reports current alcohol use of about 3.0 standard drinks of alcohol per week. He reports that he does not use drugs.      Allergies: No Known Allergies            Medications Prior to Admission  Medication Sig Dispense Refill  . acetaminophen (TYLENOL) 500 MG tablet Take 1,000 mg by mouth See admin instructions. Take 1,000 mg by mouth two to five times a day as needed for pain      . amLODipine (NORVASC) 10 MG tablet Take 1 tablet (10 mg total) by mouth daily. 90 tablet 3  . famotidine (PEPCID) 20 MG tablet Take 1 tablet (20 mg total) by mouth 2 (two) times daily. (Patient not taking: Reported on 06/27/2019) 60 tablet 2  . levocetirizine (XYZAL) 5 MG tablet Take 1 tablet (5 mg total) by mouth every evening. (Patient not taking: Reported on 06/27/2019) 30 tablet 2  . metoprolol succinate (TOPROL-XL) 50 MG 24 hr tablet Take 1 tablet (50 mg total) by mouth daily. Take with or immediately following a meal. (Patient not taking: Reported on 06/27/2019) 90 tablet 3  . potassium chloride (KLOR-CON 10) 10 MEQ tablet Take 2 tablets (20 mEq total) by mouth daily. (Patient not taking: Reported on 06/27/2019) 180 tablet 0  . tamsulosin (FLOMAX) 0.4 MG CAPS capsule Take 0.4 mg by mouth.          Drug Regimen Review  Drug regimen was reviewed and remains appropriate with no significant issues identified   Home: Home Living Family/patient expects to be discharged to:: Private residence Living Arrangements: Spouse/significant other Available Help at Discharge: Family Type of Home: House Home Access: Stairs to enter [employer] of Steps: slight step to get onto porch Entrance Stairs-Rails: None Home Layout: (12 steps inside house with no rails) Bathroom Shower/Tub: Engineer, manufacturing systems: Standard Bathroom Accessibility: Yes Home Equipment: Environmental consultant - 2 wheels Additional Comments: Per  wife, pt tries not to use walker or cane  Lives With: Spouse   Functional History: Prior Function Level of Independence: Independent Comments: working doing Mudlogger work   Functional Status:  Mobility: Bed Mobility Overal bed mobility: Needs Assistance Bed Mobility: Rolling, Sidelying to Sit Rolling: Min assist Sidelying to sit: Min assist Supine to sit: Min assist General bed mobility comments: assisted pt to come forward up via R elbow. Transfers Overall transfer level: Needs assistance Equipment used: Rolling walker (2 wheeled) Transfers: Sit to/from Stand, [employer] Transfers Sit to Stand: Mod assist, +2 physical assistance Stand pivot transfers: Mod assist, +2 physical assistance General transfer comment: Cues needed to use hands to push off surface leaving even though painful.  Pt  with L knee buckle when up requring mod assist x2 to recover. Ambulation/Gait Ambulation/Gait assistance: Mod assist, +2 physical assistance, +2 safety/equipment Gait Distance (Feet): 15 Feet(x2) Assistive device: Rolling walker (2 wheeled) Gait Pattern/deviations: Step-to pattern, Step-through pattern, Decreased step length - left, Decreased step length - right, Decreased stance time - left, Decreased stride length General Gait Details: uncoordinated steps bil, but pt able to keep and appropriate BOS, keep each knee extended during stance, though unable to unlock knee for coordinated heel/toe pattern. Gait velocity interpretation: <1.31 ft/sec, indicative of household ambulator   ADL: ADL Overall ADL's : Needs assistance/impaired Eating/Feeding: Set up, Sitting Eating/Feeding Details (indicate cue type and reason): Pt feeding self finger foods without assist and drinking from large mug with handle without assist. pt continues to require assist cutting food.  Ataxia makes keeping food on fork difficult. Grooming: Oral care, Wash/dry face, Minimal assistance, Sitting Grooming  Details (indicate cue type and reason): opening and closing small containers is difficult.  Discussed other options such as large toothpaste pump, larger toothbrush etc.   Upper Body Bathing: Minimal assistance, Sitting Lower Body Bathing: Maximal assistance, +2 for physical assistance, Sit to/from stand Upper Body Dressing : Minimal assistance, Sitting Upper Body Dressing Details (indicate cue type and reason): donning new gown Lower Body Dressing: Maximal assistance, +2 for physical assistance, Sit to/from stand Lower Body Dressing Details (indicate cue type and reason): Pt unable to sit in figure 4 to cross legs to donn socks and reaching forward was difficult today.  pt transferred sit to stand with mod assist x2 .  Once up pt cannot let go of walker on one side to assist with adls in standing.  Toilet Transfer: Minimal assistance, +2 for physical assistance, RW Toilet Transfer Details (indicate cue type and reason): Pt did have L knee buckle during transfer.  Assist provided from therapist to remain standing. pt requring cues to reach [facility] to [facility] before sitting. Toileting- Clothing Manipulation and Hygiene: Moderate assistance, Sit to/from stand Functional mobility during ADLs: Moderate assistance, +2 for physical assistance General ADL Comments: Pt limited with adls in standing due to poor leg strength and ataxia and adls limited in sitting due to UE burning sensation and poor coordination.   Cognition: Cognition Overall Cognitive Status: Within Functional Limits for tasks assessed Orientation Level: Oriented X4 Cognition Arousal/Alertness: Awake/alert Behavior During Therapy: WFL for tasks assessed/performed Overall Cognitive Status: Within Functional Limits for tasks assessed General Comments: Pt very frustrated with current status. Works hard. Motivated.     Blood pressure 129/86, pulse (!) 108, temperature 98.5 F (36.9 C), temperature source Oral, resp. rate 16, height 5\' 11"   (1.803 m), weight 112.4 kg, SpO2 97 %.     General: Alert and oriented x 3, No apparent distress HEENT: Head is normocephalic, atraumatic, PERRLA, EOMI, sclera anicteric, oral mucosa pink and moist, dentition intact, ext ear canals clear,  Neck: Supple without JVD or lymphadenopathy Heart: Reg rate and rhythm. No murmurs rubs or gallops Chest: CTA bilaterally without wheezes, rales, or rhonchi; no distress Abdomen: Soft, non-tender, non-distended, bowel sounds positive. Extremities: No clubbing, cyanosis, or edema. Pulses are 2+ Skin: Small scabs left lower lip.   Neuro/Musculoskeletal: Right facial weakness with severe dysarthria. Has problems moving tongue and closing mouth. Reporting severe perioral numbness with soreness lower lip.  4+/5 strength throughout. Ataxia. AOx3. Able to spell WORLD backwards on 2nd try. Intact repetition. 1/3 delayed recall, 2/3 with cues Psych: Pt's affect is appropriate. Pt is cooperative  Lab Results Last 48 Hours        Results for orders placed or performed during the hospital encounter of 06/27/19 (from the past 48 hour(s))  Folate     Status: None    Collection Time: 07/05/19  2:11 PM  Result Value Ref Range    Folate 22.1 >5.9 ng/mL      Comment: Performed at [facility], [address] [place], [place] [address]  Basic metabolic panel     Status: Abnormal    Collection Time: 07/05/19  8:54 PM  Result Value Ref Range    Sodium 125 (L) 135 - 145 mmol/L    Potassium 4.2 3.5 - 5.1 mmol/L    Chloride 96 (L) 98 - 111 mmol/L    CO2 23 22 - 32 mmol/L    Glucose, Bld 125 (H) 70 - 99 mg/dL      Comment: Glucose reference range applies only to samples taken after fasting for at least 8 hours.    BUN 11 6 - 20 mg/dL    Creatinine, Ser 9.32 0.61 - 1.24 mg/dL    Calcium 8.5 (L) 8.9 - 10.3 mg/dL    GFR calc non Af Amer >60 >60 mL/min    GFR calc Af Amer >60 >60 mL/min    Anion gap 6 5 - 15      Comment: Performed at [facility],  [address] [place], [place] [address]  Procalcitonin     Status: None    Collection Time: 07/06/19  2:36 AM  Result Value Ref Range    Procalcitonin 0.41 ng/mL      Comment:        Interpretation: PCT (Procalcitonin) <= 0.5 ng/mL: Systemic infection (sepsis) is not likely. Local bacterial infection is possible. (NOTE)       Sepsis PCT Algorithm           Lower Respiratory Tract                                      Infection PCT Algorithm    ----------------------------     ----------------------------         PCT < 0.25 ng/mL                PCT < 0.10 ng/mL         Strongly encourage             Strongly discourage   discontinuation of antibiotics    initiation of antibiotics    ----------------------------     -----------------------------       PCT 0.25 - 0.50 ng/mL            PCT 0.10 - 0.25 ng/mL               OR       >80% decrease in PCT            Discourage initiation of                                            antibiotics      Encourage discontinuation           of antibiotics    ----------------------------     -----------------------------  PCT >= 0.50 ng/mL              PCT 0.26 - 0.50 ng/mL               AND        <80% decrease in PCT             Encourage initiation of                                             antibiotics       Encourage continuation           of antibiotics    ----------------------------     -----------------------------        PCT >= 0.50 ng/mL                  PCT > 0.50 ng/mL               AND         increase in PCT                  Strongly encourage                                      initiation of antibiotics    Strongly encourage escalation           of antibiotics                                     -----------------------------                                           PCT <= 0.25 ng/mL                                                 OR                                        > 80% decrease in PCT                                      Discontinue / Do not initiate                                             antibiotics Performed at [facility], [address], [place], [place] [address]    Basic metabolic panel     Status: Abnormal    Collection Time: 07/06/19  2:36 AM  Result Value Ref Range    Sodium 126 (L) 135 - 145 mmol/L    Potassium 4.0 3.5 - 5.1 mmol/L    Chloride 96 (L) 98 -  111 mmol/L    CO2 24 22 - 32 mmol/L    Glucose, Bld 104 (H) 70 - 99 mg/dL      Comment: Glucose reference range applies only to samples taken after fasting for at least 8 hours.    BUN 9 6 - 20 mg/dL    Creatinine, Ser 0.68 0.61 - 1.24 mg/dL    Calcium 8.5 (L) 8.9 - 10.3 mg/dL    GFR calc non Af Amer >60 >60 mL/min    GFR calc Af Amer >60 >60 mL/min    Anion gap 6 5 - 15      Comment: Performed at [facility] [address] [place], [place] [address]  Magnesium     Status: None    Collection Time: 07/06/19  2:36 AM  Result Value Ref Range    Magnesium 1.8 1.7 - 2.4 mg/dL      Comment: Performed at [facility] [address], [place], [place] [address]  Basic metabolic panel     Status: Abnormal    Collection Time: 07/06/19  1:50 PM  Result Value Ref Range    Sodium 128 (L) 135 - 145 mmol/L    Potassium 4.2 3.5 - 5.1 mmol/L    Chloride 97 (L) 98 - 111 mmol/L    CO2 24 22 - 32 mmol/L    Glucose, Bld 117 (H) 70 - 99 mg/dL      Comment: Glucose reference range applies only to samples taken after fasting for at least 8 hours.    BUN 6 6 - 20 mg/dL    Creatinine, Ser 0.69 0.61 - 1.24 mg/dL    Calcium 8.7 (L) 8.9 - 10.3 mg/dL    GFR calc non Af Amer >60 >60 mL/min    GFR calc Af Amer >60 >60 mL/min    Anion gap 7 5 - 15      Comment: Performed at [facility] [address], [place], [place] [address]  Basic metabolic panel     Status: Abnormal    Collection Time: 07/07/19  2:17 AM  Result Value Ref Range    Sodium 129 (L) 135 - 145 mmol/L    Potassium 4.0 3.5 - 5.1 mmol/L    Chloride  98 98 - 111 mmol/L    CO2 23 22 - 32 mmol/L    Glucose, Bld 122 (H) 70 - 99 mg/dL      Comment: Glucose reference range applies only to samples taken after fasting for at least 8 hours.    BUN 6 6 - 20 mg/dL    Creatinine, Ser 0.72 0.61 - 1.24 mg/dL    Calcium 8.6 (L) 8.9 - 10.3 mg/dL    GFR calc non Af Amer >60 >60 mL/min    GFR calc Af Amer >60 >60 mL/min    Anion gap 8 5 - 15      Comment: Performed at [facility] [address] [place], [place] [address]  Magnesium     Status: None    Collection Time: 07/07/19  2:17 AM  Result Value Ref Range    Magnesium 1.7 1.7 - 2.4 mg/dL      Comment: Performed at [facility] [address], [place], [place] [address]  Brain natriuretic peptide     Status: None    Collection Time: 07/07/19  2:17 AM  Result Value Ref Range    B Natriuretic Peptide 27.8 0.0 - 100.0 pg/mL  Comment: Performed at [facility], [address], [place], [place] [address]      Imaging Results (Last 48 hours)  No results found.     Medical Problem List and Plan: 1.  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 2.  Antithrombotics: -DVT/anticoagulation:  Pharmaceutical: Heparin             -antiplatelet therapy: N/a 3. Pain Management: Tylenol prn.  4. Mood: LCSW to follow for evaluation and support.              -antipsychotic agents: N/a 5. Neuropsych: This patient is capable of making decisions on his own behalf. 6. Skin/Wound Care: Routine pressure relief measures.  7. Fluids/Electrolytes/Nutrition: Monitor I/O. Check lytes in am--off fluid restriction now 8. HTN: Monitor BP tid--continue Hydralazine, metoprolol and amlodipine. Has been labile.  9. Hyponatremia: Likely due to GBS. Has received Tolvaptan, fluid restriction, and salt tablets. Slowly trending upward. 129 on 4/19.  10. H/o  alcohol use: Continue Thiamine and Folic acid. boderline low Mg 1.7-1.8 range. Will add supplement.  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  12. Abnormal LFTs: Recheck in am.  13. Bladder incontinence:  14. Dysarthria: Severe this am with increase in right facial weakness. Discussed with Dr. Thedore Mins and Felicie Morn PA for neuro.  Benadryl recommended to help with edema. They report that speech does fluctuate on and off. NIF ordered X 2 per and neuro recommends formal consult in am if speech not improved.  15. Insomnia: Continue Ambien prn    Jacquelynn Cree, PA-C 07/07/2019    I have personally performed a face to face diagnostic evaluation, including, but not limited to relevant history and physical exam findings, of this patient and developed relevant assessment and plan.  Additionally, I have reviewed and concur with the physician assistant's documentation above.   Sula Soda, MD

## 2019-07-07 NOTE — Discharge Instructions (Signed)
Follow with Primary MD [name], DO in 7 days   Get CBC, BMP checked next visit within 1 week by Primary MD or SNF MD   Activity: As tolerated with Full fall precautions use walker/cane & assistance as needed  Disposition CIR  Diet: Heart Healthy with strict 1.5lit/day fluid restriction  Special Instructions: If you have smoked or chewed Tobacco  in the last 2 yrs please stop smoking, stop any regular Alcohol  and or any Recreational drug use.  On your next visit with your primary care physician please Get Medicines reviewed and adjusted.  Please request your Prim.MD to go over all Hospital Tests and Procedure/Radiological results at the follow up, please get all Hospital records sent to your Prim MD by signing hospital release before you go home.  If you experience worsening of your admission symptoms, develop shortness of breath, life threatening emergency, suicidal or homicidal thoughts you must seek medical attention immediately by calling 911 or calling your MD immediately  if symptoms less severe.  You Must read complete instructions/literature along with all the possible adverse reactions/side effects for all the Medicines you take and that have been prescribed to you. Take any new Medicines after you have completely understood and accpet all the possible adverse reactions/side effects.

## 2019-07-07 NOTE — Progress Notes (Signed)
Patient arrived to unit via bed at approximately 1715, accompanied by nurse and wife. No signs of distress noted.

## 2019-07-07 NOTE — Progress Notes (Signed)
Bethel Born, CCC-SLP  Rehab Admission Coordinator  Physical Medicine and Rehabilitation  PMR Pre-admission      Signed  Date of Service:  07/07/2019 10:35 AM      Related encounter: ED to Hosp-Admission (Current) from 06/27/2019 in [place] [facility]      Signed        Show:Clear all [x]Manual[x]Template[x]Copied  Added by: [x]Graves Leodis Binet, CCC-SLP  []Hover for details PMR Admission Coordinator Pre-Admission Assessment   Patient: Jorge Weaver. is an 54 y.o., male MRN: [id number] DOB: 07-Oct-1965 Height: 5' 11" (180.3 cm) Weight: 112.4 kg                                                                                                                                                  Insurance Information HMO:    PPO: yes     PCP:      IPA:      80/20:      OTHER:  PRIMARY: [employer] [place]      Policy#: [id number]      Subscriber: Patient CM Name: faxed approval from Amy    Phone#: [phone]     Fax#: [phone] Pre-Cert#: [id number]   Effectvie dates 07/04/19 with clinical update required  by 07/14/19. Follow up CM: Amy (p): [phone]; (f): [phone]     Employer:  Benefits:  Phone #: [phone]     Name:  Eff. Date: 03/21/19-still active     Deduct: $2,000 ($0 met)      Out of Pocket Max: $6,000 (included deductible - $50 met)      Life Max:   CIR: 90% coverage, 10% co-insurance      SNF: 80% coverage, 20% co-insurance; with a limit of 60 days/cal year Outpatient: 80% coverage, 20% co-insurance; limited by medical necessity     Home Health: 80% coverage, 20% co-insurance; limited by medical necessity       DME: 80% coverage, 20% co-insurance      Providers: in-network SECONDARY:       Policy#:       Phone#:    Development worker, community:       Phone#:    The Engineer, [employer]" for patients in Inpatient Rehabilitation Facilities with attached "Privacy Act Hazel Records" was provided and verbally  reviewed with: N/A   Emergency Contact Information         Contact Information     Name Relation Home Work Mobile    [place] Spouse [phone]   [phone]       Current Medical History  Patient Admitting Diagnosis: Guillain-Barre   History of Present Illness: Jorge Weaver is a 54 year old male with history of HTN, cervical spondylosis with myelopathy s/p decompression 12/2018, ETOH use who was admitted on 06/29/19 with increasing numbness in BUE/BLE progressing to  umbilicus and face, recurrent falls and hesitancy. He was severely hypokalemic- K-2.2 at admission which was supplemented. MRI brain was negative for acute changes. MRI cervical spine showed ACDF C5-C7 with resolved stenosis and moderate neural foraminal stenosis C5 and C8 nerve roots, severe at L-C6 nerve level. MRI lumbar spine showed progression fo DD L4/L5 with new right foraminal annular fissure and moderate right L4 foraminal stenosis, advanced L5/S1 degeneration with moderate to severe L5 foraminal stenosis.  Dr. [name] consulted and felt that surgical intervention not needed.    Dr. Lorraine Lax consulted for input and recommended full work up as well as IV thiamine due to history of alcohol abuse with evidence of macrocytic anemia and to rule out GBS as hypokalemia had improved without improvement in symptoms.  Labs for heavy metals and nutritional deficiency negative. RPR/HIV negative. LP done revealing albuminocytologic dissociation and was treated with IVIG X 5 days--completed on 4/16.   Blood cultures done 4/15 due to fevers and pending.  He developed hyponatremia with drop in Na-125 this weekend --was treated briefly with lasix, tolvaptan and IVF with improvement in sodium levels. Patient continues to be limited by neuropathy with weakness affecting functional status and will admit to CIR on 07/07/19 due to functional decline.  [place] Coma Scale Score: 15   Past Medical History  Past Medical History:  Diagnosis Date    . Anemia      low iron  . Asthma      as a child  . Cervical spondylosis with myelopathy    . Fatty liver    . GERD (gastroesophageal reflux disease)    . Heart murmur      when he was younger   . Hypertension    . Pneumonia        Family History  family history includes Colon cancer in his sister; Heart attack in his father; Hypertension in his mother.   Prior Rehab/Hospitalizations:  Has the patient had prior rehab or hospitalizations prior to admission? Yes   Has the patient had major surgery during 100 days prior to admission? No   Current Medications    Current Facility-Administered Medications:  .  acetaminophen (TYLENOL) tablet 650 mg, 650 mg, Oral, PRN, Greta Doom, MD, 650 mg at 07/05/19 2119 .  amLODipine (NORVASC) tablet 10 mg, 10 mg, Oral, Daily, Rise Patience, MD, 10 mg at 07/06/19 0909 .  folic acid (FOLVITE) tablet 1 mg, 1 mg, Oral, Daily, Donnamae Jude, RPH, 1 mg at 07/06/19 7858 .  gabapentin (NEURONTIN) tablet 300 mg, 300 mg, Oral, TID, Lala Lund K, MD, 300 mg at 07/06/19 2144 .  heparin injection 5,000 Units, 5,000 Units, Subcutaneous, Q8H, Greta Doom, MD, 5,000 Units at 07/07/19 0523 .  hydrALAZINE (APRESOLINE) injection 10 mg, 10 mg, Intravenous, Q4H PRN, Rise Patience, MD, 10 mg at 06/28/19 8502 .  hydrALAZINE (APRESOLINE) tablet 100 mg, 100 mg, Oral, Q8H, Thurnell Lose, MD, 100 mg at 07/07/19 0523 .  lip balm (CARMEX) ointment, , Topical, PRN, Thurnell Lose, MD .  metoprolol tartrate (LOPRESSOR) tablet 100 mg, 100 mg, Oral, BID, Thurnell Lose, MD, 100 mg at 07/06/19 2143 .  nystatin (MYCOSTATIN) 100000 UNIT/ML suspension 500,000 Units, 5 mL, Oral, QID, Blount, Xenia T, NP, 500,000 Units at 07/06/19 2321 .  [DISCONTINUED] ondansetron (ZOFRAN) tablet 4 mg, 4 mg, Oral, Q6H PRN **OR** ondansetron (ZOFRAN) injection 4 mg, 4 mg, Intravenous, Q6H PRN, Rise Patience, MD .  sodium chloride tablet 1  g, 1  g, Oral, BID WC, Thurnell Lose, MD, 1 g at 07/07/19 0736 .  thiamine tablet 100 mg, 100 mg, Oral, Daily, Lala Lund K, MD .  zolpidem (AMBIEN) tablet 5 mg, 5 mg, Oral, QHS PRN, Thurnell Lose, MD, 5 mg at 07/05/19 2122   Patients Current Diet:     Diet Order                      Diet Heart Room service appropriate? Yes; Fluid consistency: Thin  Diet effective now                   Precautions / Restrictions Precautions Precautions: Fall Precaution Comments: ataxia Restrictions Weight Bearing Restrictions: No    Has the patient had 2 or more falls or a fall with injury in the past year?Yes   Prior Activity Level Community (5-7x/wk): pt still working   Prior Functional Level Prior Function Level of Independence: Independent Comments: working doing Surveyor, quantity work   Self Care: Did the patient need help bathing, dressing, using the toilet or eating?  Independent   Indoor Mobility: Did the patient need assistance with walking from room to room (with or without device)? Independent   Stairs: Did the patient need assistance with internal or external stairs (with or without device)? Independent   Functional Cognition: Did the patient need help planning regular tasks such as shopping or remembering to take medications? [place] / [place] Devices/Equipment: Environmental consultant (specify type), Cane (specify quad or straight) Home Equipment: Walker - 2 wheels   Prior Device Use: Indicate devices/aids used by the patient prior to current illness, exacerbation or injury? None of the above   Current Functional Level Cognition   Overall Cognitive Status: Within Functional Limits for tasks assessed Orientation Level: Oriented X4 General Comments: Pt very frustrated with current status. Works hard. Motivated.    Extremity Assessment (includes Sensation/Coordination)   Upper Extremity Assessment: RUE deficits/detail, LUE  deficits/detail RUE Deficits / Details: discoordinated/ataxic movements noted bil. Good strength throughout (grossly 4/5). Pt endorses tingling in bil UEs digits to shoulder. RUE Sensation: decreased light touch, decreased proprioception RUE Coordination: decreased fine motor, decreased gross motor LUE Deficits / Details: discoordinated/ataxic movements noted bil. Good strength throughout (grossly 4/5). Pt endorses tingling in bil UEs digits to shoulder. LUE Sensation: decreased light touch, decreased proprioception LUE Coordination: decreased fine motor, decreased gross motor  Lower Extremity Assessment: RLE deficits/detail, LLE deficits/detail, Generalized weakness RLE Deficits / Details: ataxia on standing, less so v. left, noting full ROM but poor eccentric control RLE Sensation: decreased light touch(tingling) RLE Coordination: decreased gross motor LLE Deficits / Details: decreased ROM in knee, unable to acheive full extension and noting abrupt contractions with poor eccentric control, worse vs. rigth LLE Sensation: decreased light touch(tingling) LLE Coordination: decreased gross motor     ADLs   Overall ADL's : Needs assistance/impaired Eating/Feeding: Set up, Sitting Eating/Feeding Details (indicate cue type and reason): Pt feeding self finger foods without assist and drinking from large mug with handle without assist. pt continues to require assist cutting food.  Ataxia makes keeping food on fork difficult. Grooming: Oral care, Wash/dry face, Minimal assistance, Sitting Grooming Details (indicate cue type and reason): opening and closing small containers is difficult.  Discussed other options such as large toothpaste pump, larger toothbrush etc.   Upper Body Bathing: Minimal assistance, Sitting Lower Body Bathing: Maximal assistance, +2 for physical assistance, Sit to/from stand  Upper Body Dressing : Minimal assistance, Sitting Upper Body Dressing Details (indicate cue type and  reason): donning new gown Lower Body Dressing: Maximal assistance, +2 for physical assistance, Sit to/from stand Lower Body Dressing Details (indicate cue type and reason): Pt unable to sit in figure 4 to cross legs to donn socks and reaching forward was difficult today.  pt transferred sit to stand with mod assist x2 .  Once up pt cannot let go of walker on one side to assist with adls in standing.  Toilet Transfer: Minimal assistance, +2 for physical assistance, RW Toilet Transfer Details (indicate cue type and reason): Pt did have L knee buckle during transfer.  Assist provided from therapist to remain standing. pt requring cues to reach back to [facility] before sitting. Toileting- Clothing Manipulation and Hygiene: Moderate assistance, Sit to/from stand Functional mobility during ADLs: Moderate assistance, +2 for physical assistance General ADL Comments: Pt limited with adls in standing due to poor leg strength and ataxia and adls limited in sitting due to UE burning sensation and poor coordination.     Mobility   Overal bed mobility: Needs Assistance Bed Mobility: Rolling, Sidelying to Sit Rolling: Min assist Sidelying to sit: Min assist Supine to sit: Min assist General bed mobility comments: assisted pt to come forward up via R elbow.     Transfers   Overall transfer level: Needs assistance Equipment used: Rolling walker (2 wheeled) Transfers: Sit to/from Stand, [employer] Transfers Sit to Stand: Mod assist, +2 physical assistance Stand pivot transfers: Mod assist, +2 physical assistance General transfer comment: Cues needed to use hands to push off surface leaving even though painful.  Pt with L knee buckle when up requring mod assist x2 to recover.     Ambulation / Gait / Stairs / Wheelchair Mobility   Ambulation/Gait Ambulation/Gait assistance: Mod assist, +2 physical assistance, +2 safety/equipment Gait Distance (Feet): 15 Feet(x2) Assistive device: Rolling walker (2 wheeled) Gait  Pattern/deviations: Step-to pattern, Step-through pattern, Decreased step length - left, Decreased step length - right, Decreased stance time - left, Decreased stride length General Gait Details: uncoordinated steps bil, but pt able to keep and appropriate BOS, keep each knee extended during stance, though unable to unlock knee for coordinated heel/toe pattern. Gait velocity interpretation: <1.31 ft/sec, indicative of household ambulator     Posture / Balance Dynamic Sitting Balance Sitting balance - Comments: can accept minimal challenge to balance with some truncal ataxia with reaction. Balance Overall balance assessment: Needs assistance Sitting-balance support: Feet supported, No upper extremity supported, Single extremity supported, Bilateral upper extremity supported Sitting balance-Leahy Scale: Fair Sitting balance - Comments: can accept minimal challenge to balance with some truncal ataxia with reaction. Standing balance support: Bilateral upper extremity supported, During functional activity Standing balance-Leahy Scale: Poor Standing balance comment: Pt heavily reliant on walker to remain standing. Pt unable to safely let go of one side of walker to do adls with other hand without losing balance.     Special needs/care consideration Continuous Drip IV  IV fluids at 138m/hr, Skin excoriated (groin; right/left) and Designated visitor wife, PVentura Sellersand daughter. Incontinent bowel, bladder.        Previous Home Environment (from acute therapy documentation) Living Arrangements: Spouse/significant other  Lives With: Spouse Available Help at Discharge: Family Type of Home: House Home Layout: (12 steps inside house with no rails) Home Access: Stairs to enter Entrance Stairs-Rails: None Entrance Stairs-Number of Steps: slight step to get onto porch Bathroom Shower/Tub: TChiropodist  Standard Bathroom Accessibility: Yes How Accessible: Accessible via walker Home  Care Services: No Additional Comments: Per wife, pt tries not to use walker or cane   Discharge Living Setting Plans for Discharge Living Setting: Patient's home Type of Home at Discharge: House Discharge Home Layout: (has 12 steps inside home with no rails) Discharge Home Access: Stairs to enter(slight step to get onto porch) Entrance Stairs-Rails: None Entrance Stairs-Number of Steps: slight step to get onto porch Discharge Bathroom Shower/Tub: Tub/shower unit Discharge Bathroom Toilet: Standard Discharge Bathroom Accessibility: Yes How Accessible: Accessible via walker Does the patient have any problems obtaining your medications?: No   Social/Family/Support Systems Patient Roles: Spouse Anticipated Caregiver: Perkins Molina, pt's wife Anticipated Ambulance person Information: [phone] Caregiver Availability: 24/7 Discharge Plan Discussed with Primary Caregiver: Yes Is Caregiver In Agreement with Plan?: Yes Does Caregiver/Family have Issues with Lodging/Transportation while Pt is in Rehab?: No     Goals Patient/Family Goal for Rehab: Supervision-PT,Min A-OT Expected length of stay: 14-19 days     Decrease burden of Care through IP rehab admission: NA     Possible need for SNF placement upon discharge: NA d/t reliable d/c plan and anticipation of pt reaching CIR goals     Patient Condition: This patient's medical and functional status has changed since the consult dated: 4/12/20211 in which the Rehabilitation Physician determined and documented that the patient's condition is appropriate for intensive rehabilitative care in an inpatient rehabilitation facility. See "History of Present Illness" (above) for medical update. Functional changes are: progression from no gait assessed by consult d/t weakness to Mod A +2 15 feet (x2) using RW.  Pt did decline from [place] for sit to stand transfers to Min A +2 and continues to require Min A to Max A +2 for  ADLs.. Patient's  medical and functional status update has been discussed with the Rehabilitation physician and patient remains appropriate for inpatient rehabilitation. Will admit to inpatient rehab today.   Preadmission Screen Completed By:  Bethel Born, CCC-SLP, 07/07/2019 10:35 AM ______________________________________________________________________   Discussed status with Dr. Ranell Patrick on 07/07/19 at 11:13 AM  and received approval for admission today.   Admission Coordinator:  Bethel Born, time 11:13 AM Sudie Grumbling  07/07/19              Cosigned by: Izora Ribas, MD at 07/07/2019 11:19 AM  Revision History

## 2019-07-07 NOTE — Progress Notes (Signed)
NIF -40+ VC 250 ml

## 2019-07-08 ENCOUNTER — Inpatient Hospital Stay (HOSPITAL_COMMUNITY): Payer: BC Managed Care – PPO | Admitting: Speech Pathology

## 2019-07-08 ENCOUNTER — Inpatient Hospital Stay (HOSPITAL_COMMUNITY): Payer: BC Managed Care – PPO | Admitting: Physical Therapy

## 2019-07-08 ENCOUNTER — Inpatient Hospital Stay (HOSPITAL_COMMUNITY): Payer: BC Managed Care – PPO | Admitting: Occupational Therapy

## 2019-07-08 LAB — CULTURE, BLOOD (ROUTINE X 2)
Culture: NO GROWTH
Culture: NO GROWTH
Special Requests: ADEQUATE

## 2019-07-08 LAB — CBC WITH DIFFERENTIAL/PLATELET
Abs Immature Granulocytes: 0.04 10*3/uL (ref 0.00–0.07)
Basophils Absolute: 0 10*3/uL (ref 0.0–0.1)
Basophils Relative: 1 %
Eosinophils Absolute: 0.1 10*3/uL (ref 0.0–0.5)
Eosinophils Relative: 1 %
HCT: 32 % — ABNORMAL LOW (ref 39.0–52.0)
Hemoglobin: 10.6 g/dL — ABNORMAL LOW (ref 13.0–17.0)
Immature Granulocytes: 1 %
Lymphocytes Relative: 32 %
Lymphs Abs: 1.6 10*3/uL (ref 0.7–4.0)
MCH: 33.5 pg (ref 26.0–34.0)
MCHC: 33.1 g/dL (ref 30.0–36.0)
MCV: 101.3 fL — ABNORMAL HIGH (ref 80.0–100.0)
Monocytes Absolute: 1 10*3/uL (ref 0.1–1.0)
Monocytes Relative: 21 %
Neutro Abs: 2.3 10*3/uL (ref 1.7–7.7)
Neutrophils Relative %: 44 %
Platelets: 389 10*3/uL (ref 150–400)
RBC: 3.16 MIL/uL — ABNORMAL LOW (ref 4.22–5.81)
RDW: 13.6 % (ref 11.5–15.5)
WBC: 5 10*3/uL (ref 4.0–10.5)
nRBC: 0 % (ref 0.0–0.2)

## 2019-07-08 LAB — COMPREHENSIVE METABOLIC PANEL
ALT: 34 U/L (ref 0–44)
AST: 44 U/L — ABNORMAL HIGH (ref 15–41)
Albumin: 2.5 g/dL — ABNORMAL LOW (ref 3.5–5.0)
Alkaline Phosphatase: 68 U/L (ref 38–126)
Anion gap: 9 (ref 5–15)
BUN: 7 mg/dL (ref 6–20)
CO2: 23 mmol/L (ref 22–32)
Calcium: 8.8 mg/dL — ABNORMAL LOW (ref 8.9–10.3)
Chloride: 97 mmol/L — ABNORMAL LOW (ref 98–111)
Creatinine, Ser: 0.63 mg/dL (ref 0.61–1.24)
GFR calc Af Amer: >60 mL/min (ref >60)
GFR calc non Af Amer: >60 mL/min (ref >60)
Glucose, Bld: 102 mg/dL — ABNORMAL HIGH (ref 70–99)
Potassium: 3.9 mmol/L (ref 3.5–5.1)
Sodium: 129 mmol/L — ABNORMAL LOW (ref 135–145)
Total Bilirubin: 0.4 mg/dL (ref 0.3–1.2)
Total Protein: 8.4 g/dL — ABNORMAL HIGH (ref 6.5–8.1)

## 2019-07-08 LAB — PROCALCITONIN: Procalcitonin: 0.3 ng/mL

## 2019-07-08 LAB — MAGNESIUM: Magnesium: 1.8 mg/dL (ref 1.7–2.4)

## 2019-07-08 MED ORDER — TRAMADOL HCL 50 MG PO TABS
50.0000 mg | ORAL_TABLET | Freq: Four times a day (QID) | ORAL | Status: DC
  Administered 2019-07-08 – 2019-08-02 (×95): 50 mg via ORAL
  Filled 2019-07-08 (×97): qty 1

## 2019-07-08 MED ORDER — ENOXAPARIN SODIUM 40 MG/0.4ML ~~LOC~~ SOLN
40.0000 mg | SUBCUTANEOUS | Status: DC
  Administered 2019-07-08 – 2019-08-02 (×26): 40 mg via SUBCUTANEOUS
  Filled 2019-07-08 (×26): qty 0.4

## 2019-07-08 MED ORDER — LIDOCAINE 5 % EX PTCH
2.0000 | MEDICATED_PATCH | CUTANEOUS | Status: DC
  Administered 2019-07-08 – 2019-08-02 (×26): 2 via TRANSDERMAL
  Filled 2019-07-08 (×26): qty 2

## 2019-07-08 MED ORDER — PREGABALIN 75 MG PO CAPS
75.0000 mg | ORAL_CAPSULE | Freq: Three times a day (TID) | ORAL | Status: DC
  Administered 2019-07-08 – 2019-07-10 (×7): 75 mg via ORAL
  Filled 2019-07-08 (×7): qty 1

## 2019-07-08 NOTE — Progress Notes (Signed)
Pt is asleep and would not wake up to perform NIF and VC properly.

## 2019-07-08 NOTE — Patient Care Conference (Signed)
Inpatient RehabilitationTeam Conference and Plan of Care Update Date: 07/08/2019   Time: 11:25 AM    Patient Name: Jorge Weaver.      Medical Record Number: [id number]  Date of Birth: 10-Jul-1965 Sex: Male         Room/Bed: 4W01C/4W01C-01 Payor Info: Payor: BLUE CROSS BLUE SHIELD / Plan: BCBS COMM PPO / Product Type: *No Product type* /    Admit Date/Time:  07/07/2019  5:13 PM  Primary Diagnosis:  GBS (Guillain-Barre syndrome) (HCC)  Patient Active Problem List   Diagnosis Date Noted  . GBS (Guillain-Barre syndrome) (HCC) 07/07/2019  . Hypokalemia 06/28/2019  . Weakness 06/27/2019  . Cervical spondylosis with myelopathy and radiculopathy 01/16/2019  . Spinal stenosis of lumbar region 12/24/2018  . Gastroesophageal reflux disease 06/26/2018  . Essential hypertension 06/17/2018  . Fatty liver   . Witnessed apneic spells 03/28/2017  . Prediabetes 01/18/2017  . Bronchitis 05/21/2014  . ETOH abuse 05/21/2014  . Asthma 01/15/2013  . Asthma with exacerbation 01/15/2013  . Generalized anxiety disorder 08/24/2012  . Malaise and fatigue 08/24/2012    Expected Discharge Date: Expected Discharge Date: (Estimated LOS 2 1/2 - 3 weeks)  Team Members Present: Physician leading conference: Dr. Genice Rouge Care Coodinator Present: Cecile Sheerer, LCSWA;Christina Vita Barley, BSW;Genie Brendalee Matthies, RN, MSN Nurse Present: Margot Ables, LPN PT Present: Peter Congo, PT OT Present: Ardis Rowan, COTA;Jennifer Katrinka Blazing, OT SLP Present: Suzzette Righter, CF-SLP PPS Coordinator present : Edson Snowball, [place] Breed, SLP     Current Status/Progress Goal Weekly Team Focus  Bowel/Bladder   pt is incont of bladder, cant feel when going, cont of bowel, can go days without bowel movement LMB-4/19  remain cont of bowel , help pt regain feeling of bladder, give prn medication for bowel  assess b&b qshift and prn   Swallow/Nutrition/ Hydration             ADL's   Max A LB ADLs, Mod A UB ADLs, Max A  transfers  Supervision  transfers, activity tolerance, sit<>stands, Ataxia, UB strength.coordination   Mobility   mod A bed mob, mod A sit to stand to RW, squat pivot max A, min A w/c mob x 100'  S to min A overall  PT eval   Communication   eval pending         Safety/Cognition/ Behavioral Observations  eval pending         Pain   pt has c/o of hip pain prn of tyneol  pain level to remain below a 3  assess pain qshift and prn   Skin   pt has no scar on posterior penis due to cc cutting pt  remain free of any new skin impairment  assess skin qshift and prn    Rehab Goals Patient on target to meet rehab goals: Yes Rehab Goals Revised: Patient and family goal to discharge home *See Care Plan and progress notes for long and short-term goals.     Barriers to Discharge  Current Status/Progress Possible Resolutions Date Resolved   Nursing  Incontinence  bowel/bladder, fall risk            PT  Decreased caregiver support;Medical stability;Home environment access/layout;Incontinence                 OT                  SLP                SW Home environment  access/layout Patient entrance access is steep, spouse reports causing patient to fall on several occasions            Discharge Planning/Teaching Needs: Home with wife and assistance from 37 yo child.  Family education to be scheduled per therapy recommendations.        Team Discussion: MD nerve pain in back, lidoderm, lyrica ordered, tramadol ordered, B/B issues, PVC;s ordered, senokot ordered.  RN pain hips, med given, timed toileting.  PT eval today done, mod bed, mod stand, max pivot transfer, S/min A goals, has stairs at home.  SLP tired, ST recall impaired, tongue numb, eval today.  OT eval pending.   Revisions to Treatment Plan: N/A     Medical Summary Current Status: low back pain; put on lidoderm; bladder and bowel issues Weekly Focus/Goal: mod assist bed; squat pivot max assist- lots of stairs to get into home   Barriers to Discharge: Decreased family/caregiver support;Home enviroment access/layout;Incontinence;Neurogenic Bowel & Bladder;Medical stability;Weight  Barriers to Discharge Comments: SLP- cognitive eval- issues with STM; speech fluctuated 90% intelligible Possible Resolutions to Barriers: OT pending- 2.5-3 weeks   Continued Need for Acute Rehabilitation Level of Care: The patient requires daily medical management by a physician with specialized training in physical medicine and rehabilitation for the following reasons: Direction of a multidisciplinary physical rehabilitation program to maximize functional independence : Yes Medical management of patient stability for increased activity during participation in an intensive rehabilitation regime.: Yes Analysis of laboratory values and/or radiology reports with any subsequent need for medication adjustment and/or medical intervention. : Yes   I attest that I was present, lead the team conference, and concur with the assessment and plan of the team.   Jodell Cipro M 07/10/2019, 7:21 PM   Team conference was held via web/ teleconference due to [place] - 19

## 2019-07-08 NOTE — Plan of Care (Signed)
  Problem: Consults Goal: RH SPINAL CORD INJURY PATIENT EDUCATION Description:  See Patient Education module for education specifics.  Outcome: Progressing Goal: Skin Care Protocol Initiated - if Braden Score 18 or less Description: If consults are not indicated, leave blank or document N/A Outcome: Progressing Goal: Nutrition Consult-if indicated Outcome: Progressing   Problem: SCI BOWEL ELIMINATION Goal: RH STG MANAGE BOWEL WITH ASSISTANCE Description: STG Manage Bowel with mod I Assistance. Outcome: Progressing Goal: RH STG MANAGE BOWEL W/EQUIPMENT W/ASSISTANCE Description: STG Manage Bowel With Equipment With Assistance Outcome: Progressing   Problem: SCI BLADDER ELIMINATION Goal: RH STG MANAGE BLADDER WITH ASSISTANCE Description: STG Manage Bladder With mod I Assistance Outcome: Progressing Goal: RH OTHER STG BLADDER ELIMINATION GOALS W/ASSIST Description: Other STG Bladder Elimination Goals With mod I Assistance Outcome: Progressing   Problem: RH SKIN INTEGRITY Goal: RH STG SKIN FREE OF INFECTION/BREAKDOWN Description: Patient will not acquire any skin break down while on IP rehab Outcome: Progressing Goal: RH STG MAINTAIN SKIN INTEGRITY WITH ASSISTANCE Description: STG Maintain Skin Integrity With mod I Assistance. Outcome: Progressing Goal: RH STG ABLE TO PERFORM INCISION/WOUND CARE W/ASSISTANCE Description: STG Able To Perform Incision/Wound Care With mod I Assistance. Outcome: Progressing   Problem: RH SAFETY Goal: RH STG ADHERE TO SAFETY PRECAUTIONS W/ASSISTANCE/DEVICE Description: STG Adhere to Safety Precautions With mod I Assistance/Device. Outcome: Progressing   Problem: RH PAIN MANAGEMENT Goal: RH STG PAIN MANAGED AT OR BELOW PT'S PAIN GOAL Description: Pain scale <4/10 Outcome: Progressing   Problem: RH KNOWLEDGE DEFICIT SCI Goal: RH STG INCREASE KNOWLEDGE OF SELF CARE AFTER SCI Description: Patient will be able to read back instructions on self care,  his condition & preventative measures by discharge Outcome: Progressing   

## 2019-07-08 NOTE — Care Management (Signed)
[facility] Individual Statement of Services  Patient Name:  Jorge Weaver.  Date:  07/08/2019  Welcome to the [facility].  Our goal is to provide you with an individualized program based on your diagnosis and situation, designed to meet your specific needs.  With this comprehensive rehabilitation program, you will be expected to participate in at least 3 hours of rehabilitation therapies Monday-Friday, with modified therapy programming on the weekends.  Your rehabilitation program will include the following services:  Physical Therapy (PT), Occupational Therapy (OT), Speech Therapy (ST), 24 hour per day rehabilitation nursing, Therapeutic Recreaction (TR), Neuropsychology, Case Management (Social Worker), Rehabilitation Medicine, Nutrition Services, Pharmacy Services and Other  Weekly team conferences will be held on Tuesdays to discuss your progress.  Your Social Worker will talk with you frequently to get your input and to update you on team discussions.  Team conferences with you and your family in attendance may also be held.  Expected length of stay: 14-19 Days  Overall anticipated outcome: Supervision - Min A  Depending on your progress and recovery, your program may change. Your Social Worker will coordinate services and will keep you informed of any changes. Your Social Worker's name and contact numbers are listed  below.  The following services may also be recommended but are not provided by the [facility]:    Home Health Rehabiltiation Services  Outpatient Rehabilitation Services    Arrangements will be made to provide these services after discharge if needed.  Arrangements include referral to agencies that provide these services.  Your insurance has been verified to be:  [employer]  Your primary doctor is:  Arva Chafe  Pertinent information will be shared with your doctor and your insurance  company.  Social Worker:  Lavera Guise, [place] [phone] or ([phone]   Information discussed with and copy given to patient by: Andria Rhein, 07/08/2019, 9:49 AM

## 2019-07-08 NOTE — Progress Notes (Signed)
Social Work Patient ID: Jorge Glad., male   DOB: 1965-08-26, 54 y.o.   MRN: [id number]  SW went to give updates to patient form team conference. Called spouse at bedside via telephone. Inform patient and spouse on updates from: MD, nursing and therapy. Informed them on potential estimated length of stay, both pleased. Assisted patient with lunch. Spouse informed me of paperwork needing to be signed by MD, once SW has access will drop off to MD. No additional questions/concerns.

## 2019-07-08 NOTE — Progress Notes (Signed)
Social Work Assessment and Plan   Patient Details  Name: Jorge Weaver. MRN: [id number] Date of Birth: Apr 03, 1965  Today's Date: 07/08/2019  Problem List:  Patient Active Problem List   Diagnosis Date Noted  . GBS (Guillain-Barre syndrome) ([place]) 07/07/2019  . Hypokalemia 06/28/2019  . Weakness 06/27/2019  . Cervical spondylosis with myelopathy and radiculopathy 01/16/2019  . Spinal stenosis of lumbar region 12/24/2018  . Gastroesophageal reflux disease 06/26/2018  . Essential hypertension 06/17/2018  . Fatty liver   . Witnessed apneic spells 03/28/2017  . Prediabetes 01/18/2017  . Bronchitis 05/21/2014  . ETOH abuse 05/21/2014  . Asthma 01/15/2013  . Asthma with exacerbation 01/15/2013  . Generalized anxiety disorder 08/24/2012  . Malaise and fatigue 08/24/2012   Past Medical History:  Past Medical History:  Diagnosis Date  . Anemia    low iron  . Asthma    as a child  . Cervical spondylosis with myelopathy   . Fatty liver   . GERD (gastroesophageal reflux disease)   . Heart murmur    when he was younger   . Hypertension   . Pneumonia    Past Surgical History:  Past Surgical History:  Procedure Laterality Date  . ANTERIOR CERVICAL DECOMP/DISCECTOMY FUSION N/A 01/16/2019   Procedure: Cervical six corpectomy with Cervical five-seven fusion and plating;  Surgeon: Ashok Pall, MD;  Location: [place];  Service: Neurosurgery;  Laterality: N/A;  . NO PAST SURGERIES    . WISDOM TOOTH EXTRACTION    . WRIST SURGERY Left 2014   fx   Social History:  reports that he has never smoked. He has never used smokeless tobacco. He reports current alcohol use of about 3.0 standard drinks of alcohol per week. He reports that he does not use drugs.  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 Issues: N/a   Abuse/Neglect Abuse/Neglect Assessment Can Be Completed: Yes Physical Abuse: Denies Verbal Abuse: Denies Sexual Abuse: Denies Exploitation of patient/patient's resources: Denies Self-Neglect: Denies  Emotional Status Pt's affect, behavior and adjustment status: N/a Recent Psychosocial Issues: N/a Psychiatric History: N/a Substance Abuse History: Alcohol abuse about 1-2 years ago  Patient / Family Perceptions, Expectations & Goals Pt/Family understanding of illness & functional limitations: Yes Pt/family expectations/goals: Patient and family goal is to return home  [employer]: None Premorbid Home Care/DME Agencies: None Transportation available at discharge: Spouse able to transport Resource referrals recommended: Neuropsychology  Discharge Planning Living Arrangements: Spouse/significant other, Children Support Systems: Spouse/significant other, Children Type of Residence: Private residence(8 Steps inside, steep hill entering,) Insurance Resources: Multimedia programmer (specify)([employer]) Living Expenses: Mortgage Money Management: Patient, Spouse Does the patient have any problems obtaining your medications?: No Sw Barriers to Discharge: Inaccessible home environment Social Work Anticipated Follow Up Needs: HH/OP Expected length of stay: 2-3 Weeks  Clinical Impression SW met patient to introduce self and explain role. Patient tired, but pleasant. Pt is not a English as a second language teacher.  Dyanne Iha 07/08/2019, 1:43 PM

## 2019-07-08 NOTE — Evaluation (Signed)
Occupational Therapy Assessment and Plan  Patient Details  Name: Jorge Weaver. MRN: [id number] Date of Birth: 29-Mar-1965  OT Diagnosis: ataxia and muscle weakness (generalized) Rehab Potential: Rehab Potential (ACUTE ONLY): Good ELOS: 2.5-3 weeks   Today's Date: 07/08/2019 OT Individual Time: 1300-1400 OT Individual Time Calculation (min): 60 min     Problem List:  Patient Active Problem List   Diagnosis Date Noted  . GBS (Guillain-Barre syndrome) ([place]) 07/07/2019  . Hypokalemia 06/28/2019  . Weakness 06/27/2019  . Cervical spondylosis with myelopathy and radiculopathy 01/16/2019  . Spinal stenosis of lumbar region 12/24/2018  . Gastroesophageal reflux disease 06/26/2018  . Essential hypertension 06/17/2018  . Fatty liver   . Witnessed apneic spells 03/28/2017  . Prediabetes 01/18/2017  . Bronchitis 05/21/2014  . ETOH abuse 05/21/2014  . Asthma 01/15/2013  . Asthma with exacerbation 01/15/2013  . Generalized anxiety disorder 08/24/2012  . Malaise and fatigue 08/24/2012    Past Medical History:  Past Medical History:  Diagnosis Date  . Anemia    low iron  . Asthma    as a child  . Cervical spondylosis with myelopathy   . Fatty liver   . GERD (gastroesophageal reflux disease)   . Heart murmur    when he was younger   . Hypertension   . Pneumonia    Past Surgical History:  Past Surgical History:  Procedure Laterality Date  . ANTERIOR CERVICAL DECOMP/DISCECTOMY FUSION N/A 01/16/2019   Procedure: Cervical six corpectomy with Cervical five-seven fusion and plating;  Surgeon: Ashok Pall, MD;  Location: [place];  Service: Neurosurgery;  Laterality: N/A;  . NO PAST SURGERIES    . WISDOM TOOTH EXTRACTION    . WRIST SURGERY Left 2014   fx    Assessment & Plan Clinical Impression: Patient is a 54 y.o. year old male with history of HTN, cervical spondylosis with myelopathy s/p decompression 12/2018, ETOH use who was admitted on 06/29/19 with increasing numbness in  BUE/BLE progressing to umbilicus and face, recurrent falls and hesitancy. He was severely hypokalemic- K-2.2 at admission which was supplemented. MRI brain was negative for acute changes. MRI cervical spine showed ACDF C5-C7 with resolved stenosis and moderate neural foraminal stenosis C5 and C8 nerve roots, severe at L-C6 nerve level. MRI lumbar spine showed progression fo DD L4/L5 with new right foraminal annular fissure and moderate right L4 foraminal stenosis, advanced L5/S1 degeneration with moderate to severe L5 foraminal stenosis.  Dr. [name] consulted and felt that surgical intervention not needed.    Dr. Lorraine Lax consulted for input and recommended full work up as well as IV thiamine due to history of alcohol abuse with evidence of macrocytic anemia and to rule out GBS as hypokalemia had improved without improvement in symptoms.  Labs for heavy metals and nutritional deficiency negative. RPR/HIV negative. LP done revealing albuminocytologic dissociation and was treated with IVIG X 5 days--completed on 4/16.   Blood cultures done 4/15 due to fevers and pending.  He developed hyponatremia with drop in Na-125 this weekend --was treated briefly with lasix, tolvaptan and IVF with improvement in sodium levels. Patient continues to be limited by neuropathy with weakness affecting functional status and CIR recommended due to functional decline .  Patient transferred to CIR on 07/07/2019 .    Patient currently requires max with basic self-care skills secondary to muscle weakness, decreased cardiorespiratoy endurance, impaired timing and sequencing, unbalanced muscle activation, ataxia and decreased coordination and decreased sitting balance, decreased standing balance, decreased postural control and decreased  balance strategies.  Prior to hospitalization, patient could complete badl with independent .  Patient will benefit from skilled intervention to increase independence with basic self-care skills prior to  discharge home with care partner.  Anticipate patient will require 24 hour supervision and follow up home health.  OT - End of Session Endurance Deficit: Yes Endurance Deficit Description: rest breaks within BADL tasks OT Assessment Rehab Potential (ACUTE ONLY): Good OT Patient demonstrates impairments in the following area(s): Balance;Endurance;Motor;Sensory OT Basic ADL's Functional Problem(s): Eating;Grooming;Bathing;Dressing;Toileting OT Transfers Functional Problem(s): Toilet;Tub/Shower OT Additional Impairment(s): Fuctional Use of Upper Extremity OT Plan OT Intensity: Minimum of 1-2 x/day, 45 to 90 minutes OT Frequency: 5 out of 7 days OT Duration/Estimated Length of Stay: 2.5-3 weeks OT Treatment/Interventions: Balance/vestibular training;Discharge planning;Community reintegration;Disease Lawyer;Neuromuscular re-education;Functional mobility training;Pain management;Psychosocial support;Patient/family education;Self Care/advanced ADL retraining;Therapeutic Activities;Therapeutic Exercise;UE/LE Strength taining/ROM;UE/LE Coordination activities;Wheelchair propulsion/positioning OT Self Feeding Anticipated Outcome(s): Supervision OT Basic Self-Care Anticipated Outcome(s): Supervision OT Toileting Anticipated Outcome(s): Supervision OT Bathroom Transfers Anticipated Outcome(s): Supervision OT Recommendation Patient destination: Home Follow Up Recommendations: Home health OT Equipment Recommended: To be determined   Skilled Therapeutic Intervention Pt greeted semi-reclined in bed and agreeable to OT eval and treat. Pt came to sitting EOB with min A. Pt with trunkal ataxia when coming to sitting and continued trunkal ataxia in sitting with lateral LOB to the L. Squat-pivot transfer bed>drop arm wc with max A. Stedy used for shower transfer for safety. Pt able to use B UEs for bathing tasks, but demonstrated ataxia throughout UEs. Lateral  leans to wash buttocks with max A for dynamic sitting balance when leaning outside base of support. Stedy used to transfer out of shower to EOB for dressing. Pt with difficulty w/ fine motor task of turning deodorant to get it to the top. Pt needed min A for static sitting balance and up to max with dyanamic depending on task demand. Pt started feeling light headed and diaphoretic. OT returned pt to supine and took BP. BO 109/79. Max A to pull up pants in supine using rolling and hip bridge. Pt left semi-reclined in bed with bed alarm on and needs met.   OT Evaluation Precautions/Restrictions  Precautions Precautions: Fall Precaution Comments: ataxia Restrictions Weight Bearing Restrictions: No Home Living/Prior Functioning Home Living Family/patient expects to be discharged to:: Private residence Living Arrangements: Spouse/significant other, Children Available Help at Discharge: Family Type of Home: House Home Access: Stairs to enter [employer] of Steps: slight step to get onto [employer] Insurance: None Home Layout: Laundry or work area in basement, Two level Alternate Level Stairs-Number of Steps: 9 Alternate Level Stairs-Rails: None Bathroom Shower/Tub: Government social research officer Accessibility: Yes Additional Comments: per pt report has to be able to take 9 stairs up to living area  Lives With: Spouse IADL History Leisure and Hobbies: Enjoys fishing Prior Function Level of Independence: Independent with gait, Independent with transfers Driving: Yes Comments: working as Therapist, music ADL ADL Eating: Minimal assistance Grooming: Moderate assistance Upper Body Bathing: Moderate assistance Lower Body Bathing: Maximal assistance Upper Body Dressing: Moderate assistance Lower Body Dressing: Maximal assistance Toileting: Maximal assistance Toilet Transfer: Maximal verbal cueing Tub/Shower Transfer: Maximal  assistance Cognition Overall Cognitive Status: Within Functional Limits for tasks assessed Arousal/Alertness: Awake/alert Orientation Level: Person;Place;Situation Person: Oriented Place: Oriented Situation: Oriented Year: 2021 Month: April Day of Week: Correct Memory: Appears intact Immediate Memory Recall: Sock;Blue;Bed Memory Recall Sock: Without Cue Memory Recall Blue: Without Cue Memory Recall Bed: Without Cue  Attention: Sustained;Selective Sustained Attention: Appears intact Selective Attention: Appears intact Awareness: Appears intact Problem Solving: Appears intact Executive Function: Reasoning;Initiating;Organizing Reasoning: Appears intact Organizing: Appears intact Initiating: Appears intact Safety/Judgment: Appears intact Sensation Sensation Light Touch: Impaired Detail Light Touch Impaired Details: Impaired RUE;Impaired LUE;Impaired RLE;Impaired LLE Proprioception: Impaired Detail Proprioception Impaired Details: Impaired RLE;Impaired LLE Coordination Gross Motor Movements are Fluid and Coordinated: No Fine Motor Movements are Fluid and Coordinated: No Coordination and Movement Description: decreased smoothness and accuracy Finger Nose Finger Test: ataxia Motor  Motor Motor: Ataxia Motor - Skilled Clinical Observations: all four limbs ataxic Mobility  Bed Mobility Bed Mobility: Rolling Right;Rolling Left;Supine to Sit;Sit to Supine Rolling Right: Moderate Assistance - Patient 50-74% Rolling Left: Moderate Assistance - Patient 50-74% Supine to Sit: Moderate Assistance - Patient 50-74% Sit to Supine: Moderate Assistance - Patient 50-74% Transfers Sit to Stand: Moderate Assistance - Patient 50-74%  Trunk/Postural Assessment  Cervical Assessment Cervical Assessment: Within Functional Limits Thoracic Assessment Thoracic Assessment: Exceptions to WFL(rounded shoulders) Lumbar Assessment Lumbar Assessment: Exceptions to WFL(posterior pelvic  tilt) Postural Control Postural Control: Deficits on evaluation(insufficient)  Balance Balance Balance Assessed: Yes Static Sitting Balance Static Sitting - Balance Support: No upper extremity supported;Feet supported Static Sitting - Level of Assistance: 5: Stand by assistance Dynamic Sitting Balance Dynamic Sitting - Balance Support: No upper extremity supported;Feet supported;During functional activity Dynamic Sitting - Level of Assistance: 4: Max assist Static Standing Balance Static Standing - Balance Support: Bilateral upper extremity supported;During functional activity Static Standing - Level of Assistance: 3: Max assist Extremity/Trunk Assessment RUE Assessment RUE Assessment: Exceptions to [facility] General Strength Comments: limited by ataxia and sensation deficits LUE Assessment LUE Assessment: Exceptions to [facility] General Strength Comments: limited by ataxia and sensation deficits     Refer to Care Plan for Long Term Goals  Recommendations for other services: None    Discharge Criteria: Patient will be discharged from OT if patient refuses treatment 3 consecutive times without medical reason, if treatment goals not met, if there is a change in medical status, if patient makes no progress towards goals or if patient is discharged from hospital.  The above assessment, treatment plan, treatment alternatives and goals were discussed and mutually agreed upon: by patient  Valma Cava 07/08/2019, 2:56 PM

## 2019-07-08 NOTE — Progress Notes (Signed)
Jorge Weaver PHYSICAL MEDICINE & REHABILITATION PROGRESS NOTE   Subjective/Complaints:  Pt reports pain isn't well controlled- even when takes the meds- gabapentin or tylenol, still has sharp stabbing burning pain in low back.  Also has N/T in hands, feet and legs that's bothersome and numbness and weakness in face intermittently- where will bite lip and tongue and lips feel numb/swollen.   Sleeping OK Can't pee so well; bowels- required a suppository last night.    Will change heparin to lovenox since kidney function OK.    ROS:  Pt denies SOB, abd pain, CP, N/V/C/D, and vision changes  Objective:   No results found. Recent Labs    07/08/19 0519  WBC 5.0  HGB 10.6*  HCT 32.0*  PLT 389   Recent Labs    07/07/19 0217 07/08/19 0519  NA 129* 129*  K 4.0 3.9  CL 98 97*  CO2 23 23  GLUCOSE 122* 102*  BUN 6 7  CREATININE 0.72 0.63  CALCIUM 8.6* 8.8*    Intake/Output Summary (Last 24 hours) at 07/08/2019 1607 Last data filed at 07/08/2019 1530 Gross per 24 hour  Intake 350 ml  Output 1256 ml  Net -906 ml     Physical Exam: Vital Signs Blood pressure 97/69, pulse 87, temperature 98 F (36.7 C), resp. rate 18, height 5\' 11"  (1.803 m), weight 115.5 kg, SpO2 96 %.   General: Alert and oriented x 3, No apparent distress; sitting up at EOB with PT in room, NAD HEENT: conjugate gaze; face numb R>L esp around lips Heart: RRR Chest: CTA b/L- good air movement Abdomen: soft, NT, ND, (+)BS-  Extremities: No clubbing, cyanosis, or edema. Pulses are 2+ Skin: Small scabs left lower lip.   Neuro/Musculoskeletal: R>L facial weakness with dysarthria.  4+/5 strength throughout. Ataxia. AOx3. A Psych: appropriate, cordial   Assessment/Plan: 1. 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  Physiatrist is  providing close team supervision and 24 hour management of active medical problems listed below.  Physiatrist and rehab team continue to assess barriers to discharge/monitor patient progress toward functional and medical goals  Care Tool:  Bathing    Body parts bathed by patient: Right arm, Left arm, Chest, Abdomen, Front perineal area, Left upper leg, Right upper leg, Face   Body parts bathed by helper: Left lower leg, Right lower leg, Buttocks     Bathing assist Assist Level: Maximal Assistance - Patient 24 - 49%     Upper Body Dressing/Undressing Upper body dressing   What is the patient wearing?: Pull over shirt    Upper body assist Assist Level: Moderate Assistance - Patient 50 - 74%    Lower Body Dressing/Undressing Lower body dressing      What is the patient wearing?: Pants     Lower body assist Assist for lower body dressing: Maximal Assistance - Patient 25 - 49%     Toileting Toileting    Toileting assist Assist for toileting: Maximal Assistance - Patient 25 - 49%     Transfers Chair/bed transfer  Transfers assist     Chair/bed transfer assist level: Dependent - mechanical lift     Locomotion Ambulation   Ambulation assist   Ambulation activity did not occur: Safety/medical concerns          Walk 10 feet activity   Assist  Walk 10 feet activity did not occur:  Safety/medical concerns        Walk 50 feet activity   Assist Walk 50 feet with 2 turns activity did not occur: Safety/medical concerns         Walk 150 feet activity   Assist Walk 150 feet activity did not occur: Safety/medical concerns         Walk 10 feet on uneven surface  activity   Assist Walk 10 feet on uneven surfaces activity did not occur: Safety/medical concerns         Wheelchair     Assist Will patient use wheelchair at discharge?: Yes Type of Wheelchair: Manual    Wheelchair assist level: Minimal Assistance - Patient > 75% Max  wheelchair distance: 100'    Wheelchair 50 feet with 2 turns activity    Assist        Assist Level: Minimal Assistance - Patient > 75%   Wheelchair 150 feet activity     Assist      Assist Level: Minimal Assistance - Patient > 75%   Blood pressure 97/69, pulse 87, temperature 98 F (36.7 C), resp. rate 18, height 5\' 11"  (1.803 m), weight 115.5 kg, SpO2 96 %.  Medical Problem List and Plan: 1.  Impaired mobility and ADLs secondary to Guillain Barre Syndrome with miller Fischer Variant.              -patient may shower             -ELOS/Goals: 14-19 days; modI to MinAwith PT, modI to MinA with OT, modIwith SLP. 2.  Antithrombotics: -DVT/anticoagulation:  Pharmaceutical: Heparin  4/20- changed to lovenox- renal function OK             -antiplatelet therapy: N/a 3. Pain Management: Tylenol prn.  4/20- look below for changes to pain regimen  4. Mood: LCSW to follow for evaluation and support.              -antipsychotic agents: N/a 5. Neuropsych: This patient is capable of making decisions on his own behalf. 6. Skin/Wound Care: Routine pressure relief measures.  7. Fluids/Electrolytes/Nutrition: Monitor I/O. Check lytes in am--off fluid restriction now 8. HTN: Monitor BP tid--continue Hydralazine, metoprolol and amlodipine. Has been labile.  9. Hyponatremia: Likely due to GBS. Has received Tolvaptan, fluid restriction, and salt tablets. Slowly trending upward. 129 on 4/19.   4/20- Na still 129- will con't to monitor 10. H/o alcohol use: Continue Thiamine and Folic acid. boderline low Mg 1.7-1.8 range. Will add supplement.  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  12. Abnormal LFTs: Recheck in am.  13. Bladder incontinence/neurogenic bladder:   4/20- will try and get external catheter/purewick because pt has cut/abrasion on penis from condom catheter- will also check PVRs to make sure emptying 14. Dysarthria: Severe this am with  increase in right facial weakness. Discussed with Dr. Candiss Norse and Etta Quill PA for neuro.  Benadryl recommended to help with edema. They report that speech does fluctuate on and off. NIF ordered X 2 per and neuro recommends formal consult in am if speech not improved.  15. Insomnia: Continue Ambien prn  16. 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 Acute low back pain and N/T due to GBS-  4/20- will add lidoderm patches-2 12 hrs on; 12 hrs off; tramadol 50 mg q6 hours prn; and change gabapentin to lyrica 75  mg TID  LOS: 1 days A FACE TO FACE EVALUATION WAS PERFORMED  Jorge Weaver 07/08/2019, 4:07 PM

## 2019-07-08 NOTE — Evaluation (Signed)
Physical Therapy Assessment and Plan  Patient Details  Name: Janie Strothman. MRN: [id number] Date of Birth: 05-22-1965  PT Diagnosis: Ataxia, Coordination disorder, Difficulty walking, Impaired sensation and Muscle weakness Rehab Potential: Good ELOS: 18-21 days   Today's Date: 07/08/2019 PT Individual Time: 0800-0900 PT Minutes: 60 minutes      Problem List:  Patient Active Problem List   Diagnosis Date Noted  . GBS (Guillain-Barre syndrome) ([place]) 07/07/2019  . Hypokalemia 06/28/2019  . Weakness 06/27/2019  . Cervical spondylosis with myelopathy and radiculopathy 01/16/2019  . Spinal stenosis of lumbar region 12/24/2018  . Gastroesophageal reflux disease 06/26/2018  . Essential hypertension 06/17/2018  . Fatty liver   . Witnessed apneic spells 03/28/2017  . Prediabetes 01/18/2017  . Bronchitis 05/21/2014  . ETOH abuse 05/21/2014  . Asthma 01/15/2013  . Asthma with exacerbation 01/15/2013  . Generalized anxiety disorder 08/24/2012  . Malaise and fatigue 08/24/2012    Past Medical History:  Past Medical History:  Diagnosis Date  . Anemia    low iron  . Asthma    as a child  . Cervical spondylosis with myelopathy   . Fatty liver   . GERD (gastroesophageal reflux disease)   . Heart murmur    when he was younger   . Hypertension   . Pneumonia    Past Surgical History:  Past Surgical History:  Procedure Laterality Date  . ANTERIOR CERVICAL DECOMP/DISCECTOMY FUSION N/A 01/16/2019   Procedure: Cervical six corpectomy with Cervical five-seven fusion and plating;  Surgeon: Ashok Pall, MD;  Location: [facility];  Service: Neurosurgery;  Laterality: N/A;  . NO PAST SURGERIES    . WISDOM TOOTH EXTRACTION    . WRIST SURGERY Left 2014   fx    Assessment & Plan Clinical Impression:  Sergio Zawislak is a 54 year old male with history of HTN, cervical spondylosis with myelopathy s/p decompression 12/2018, ETOH use who was admitted on 06/29/19 with increasing numbness in  BUE/BLE progressing to umbilicus and face, recurrent falls and hesitancy. He was severely hypokalemic- K-2.2 at admission which was supplemented. MRI brain was negative for acute changes. MRI cervical spine showed ACDF C5-C7 with resolved stenosis and moderate neural foraminal stenosis C5 and C8 nerve roots, severe at L-C6 nerve level. MRI lumbar spine showed progression fo DD L4/L5 with new right foraminal annular fissure and moderate right L4 foraminal stenosis, advanced L5/S1 degeneration with moderate to severe L5 foraminal stenosis.  Dr. [name] consulted and felt that surgical intervention not needed.   Dr. Lorraine Lax consulted for input and recommended full work up as well as IV thiamine due to history of alcohol abuse with evidence of macrocytic anemia and to rule out GBS as hypokalemia had improved without improvement in symptoms.  Labs for heavy metals and nutritional deficiency negative. RPR/HIV negative. LP done revealing albuminocytologic dissociation and was treated with IVIG X 5 days--completed on 4/16.   Blood cultures done 4/15 due to fevers and pending.  He developed hyponatremia with drop in Na-125 this weekend --was treated briefly with lasix, tolvaptan and IVF with improvement in sodium levels. Patient continues to be limited by neuropathy with weakness affecting functional status and CIR recommended due to functional decline. Patient transferred to CIR on 07/07/2019 .   Patient currently requires max with mobility secondary to muscle weakness, decreased cardiorespiratoy endurance, abnormal tone, unbalanced muscle activation, ataxia and decreased coordination and decreased sitting balance, decreased standing balance, decreased postural control and decreased balance strategies.  Prior to hospitalization, patient  was independent  with mobility and lived with Spouse in a House home.  Home access is slight step to get onto porchStairs to enter.  Patient will benefit from skilled PT intervention to  maximize safe functional mobility, minimize fall risk and decrease caregiver burden for planned discharge home with intermittent assist.  Anticipate patient will benefit from follow up [facility] at discharge.  PT - End of Session Activity Tolerance: Tolerates 30+ min activity with multiple rests Endurance Deficit: Yes Endurance Deficit Description: rest breaks within BADL tasks PT Assessment Rehab Potential (ACUTE/IP ONLY): Good PT Barriers to Discharge: Decreased caregiver support;Medical stability;Home environment access/layout;Incontinence PT Patient demonstrates impairments in the following area(s): Balance;Endurance;Motor;Safety;Sensory PT Transfers Functional Problem(s): Bed Mobility;Bed to Chair;Car;Furniture;Floor PT Locomotion Functional Problem(s): Ambulation;Wheelchair Mobility;Stairs PT Plan PT Intensity: Minimum of 1-2 x/day ,45 to 90 minutes PT Frequency: 5 out of 7 days PT Duration Estimated Length of Stay: 18-21 days PT Treatment/Interventions: Ambulation/gait training;Balance/vestibular training;Community reintegration;Discharge planning;DME/adaptive equipment instruction;Functional electrical stimulation;Functional mobility training;Neuromuscular re-education;Pain management;Patient/family education;Psychosocial support;Splinting/orthotics;Stair training;Therapeutic Activities;Therapeutic Exercise;UE/LE Strength taining/ROM;UE/LE Coordination activities;Wheelchair propulsion/positioning PT Transfers Anticipated Outcome(s): min A PT Locomotion Anticipated Outcome(s): min A with LRAD PT Recommendation Recommendations for Other Services: Neuropsych consult Follow Up Recommendations: Home health PT;24 hour supervision/assistance Patient destination: Home Equipment Recommended: Rolling walker with 5" wheels;Wheelchair (measurements);Wheelchair cushion (measurements);To be determined Equipment Details: TBD pending progress  Skilled Therapeutic Intervention Evaluation completed (see  details above and below) with education on PT POC and goals and individual treatment initiated with focus on functional transfer assessment, orientation to rehab unit and schedule, expected LOS and goals, etc. Pt received seated in bed, reports 7/10 pain in low back at rest. RN able to provide pain medication during session. Pt is max A to don shirt at bed level and dependent to don brief. Supine to sit with mod A for BLE management. Sit to stand with mod A to RW. Pt locks out B knees in standing, unable to maintain upright stance with knees flexed. Pt is dependent to don pants while standing at EOB. Squat pivot transfer bed to w/c with max A. Manual w/c propulsion x 100 ft with use of BUE at min A level. Pt fatigues quickly with w/c mobility. Pt left seated in w/c in room with needs in reach, quick release belt and chair alarm in place at end of session.  PT Evaluation Precautions/Restrictions Precautions Precautions: Fall Precaution Comments: ataxia Restrictions Weight Bearing Restrictions: No Home Living/Prior Functioning Home Living Living Arrangements: Spouse/significant other;Children Available Help at Discharge: Family Type of Home: House Home Access: Stairs to enter [employer] of Steps: slight step to get onto porch Entrance Stairs-Rails: None Home Layout: Laundry or work area in basement;Two level Alternate Level Stairs-Number of Steps: 9 Alternate Level Stairs-Rails: None Bathroom Shower/Tub: Chiropodist: Standard Bathroom Accessibility: Yes Additional Comments: per pt report has to be able to take 9 stairs up to living area  Lives With: Spouse Prior Function Level of Independence: Independent with gait;Independent with transfers Driving: Yes Comments: working as Hydrologist Overall Cognitive Status: Within Functional Limits for tasks assessed Arousal/Alertness: Awake/alert Orientation Level:  Oriented X4 Attention: Sustained;Selective Sustained Attention: Appears intact Selective Attention: Appears intact Memory: Appears intact Immediate Memory Recall: Sock;Blue;Bed Memory Recall Sock: Without Cue Memory Recall Blue: Without Cue Memory Recall Bed: Without Cue Awareness: Appears intact Problem Solving: Appears intact Executive Function: Reasoning;Initiating;Organizing Reasoning: Appears intact Organizing: Appears intact Initiating: Appears intact Safety/Judgment: Appears intact Sensation Sensation Light Touch:  Impaired Detail Light Touch Impaired Details: Impaired RUE;Impaired LUE;Impaired RLE;Impaired LLE Proprioception: Impaired Detail Proprioception Impaired Details: Impaired RLE;Impaired LLE Coordination Gross Motor Movements are Fluid and Coordinated: No Fine Motor Movements are Fluid and Coordinated: No Coordination and Movement Description: decreased smoothness and accuracy Finger Nose Finger Test: ataxia Motor  Motor Motor: Ataxia Motor - Skilled Clinical Observations: all four limbs ataxic  Mobility Bed Mobility Bed Mobility: Rolling Right;Rolling Left;Supine to Sit;Sit to Supine Rolling Right: Moderate Assistance - Patient 50-74% Rolling Left: Moderate Assistance - Patient 50-74% Supine to Sit: Moderate Assistance - Patient 50-74% Sit to Supine: Moderate Assistance - Patient 50-74% Transfers Transfers: Sit to [employer] Transfers Sit to Stand: Moderate Assistance - Patient 50-74% Squat Pivot Transfers: Maximal Assistance - Patient 25-49% Transfer (Assistive device): Rolling walker(sit to stand with RW) Locomotion  Stairs / Additional Locomotion Stairs: No Wheelchair Mobility Wheelchair Mobility: Yes Wheelchair Assistance: Minimal assistance - Patient >75% Wheelchair Propulsion: Both upper extremities Wheelchair Parts Management: Needs assistance Distance: 50  Trunk/Postural Assessment  Cervical Assessment Cervical Assessment: Within  Functional Limits Thoracic Assessment Thoracic Assessment: Exceptions to WFL(rounded shoulders) Lumbar Assessment Lumbar Assessment: Exceptions to WFL(posterior pelvic tilt) Postural Control Postural Control: Deficits on evaluation(insufficient)  Balance Balance Balance Assessed: Yes Static Sitting Balance Static Sitting - Balance Support: No upper extremity supported;Feet supported Static Sitting - Level of Assistance: 5: Stand by assistance Dynamic Sitting Balance Dynamic Sitting - Balance Support: No upper extremity supported;Feet supported;During functional activity Dynamic Sitting - Level of Assistance: 4: Min Insurance risk surveyor Standing - Balance Support: Bilateral upper extremity supported;During functional activity Static Standing - Level of Assistance: 3: Mod assist Extremity Assessment  RUE Assessment RUE Assessment: Exceptions to [facility] General Strength Comments: limited by ataxia and sensation deficits LUE Assessment LUE Assessment: Exceptions to [facility] General Strength Comments: limited by ataxia and sensation deficits RLE Assessment RLE Assessment: Exceptions to [facility] General Strength Comments: see below RLE Strength Right Hip Flexion: 4/5 Right Knee Flexion: 4-/5 Right Knee Extension: 4/5 Right Ankle Dorsiflexion: 4/5 LLE Assessment LLE Assessment: Exceptions to [facility] General Strength Comments: see below LLE Strength Left Hip Flexion: 3+/5 Left Knee Flexion: 3+/5 Left Knee Extension: 3+/5 Left Ankle Dorsiflexion: 4/5    Refer to Care Plan for Long Term Goals  Recommendations for other services: Neuropsych  Discharge Criteria: Patient will be discharged from PT if patient refuses treatment 3 consecutive times without medical reason, if treatment goals not met, if there is a change in medical status, if patient makes no progress towards goals or if patient is discharged from hospital.  The above assessment, treatment plan, treatment  alternatives and goals were discussed and mutually agreed upon: by patient   Excell Seltzer, PT, DPT 07/08/2019, 2:56 PM

## 2019-07-08 NOTE — Evaluation (Signed)
Speech Language Pathology Assessment and Plan  Patient Details  Name: Jorge Weaver. MRN: [id number] Date of Birth: 09/07/1965  SLP Diagnosis: Dysarthria  Rehab Potential: Good ELOS: 2 weeks    Today's Date: 07/08/2019 SLP Individual Time: 5176-1607 SLP Individual Time Calculation (min): 57 min   Problem List:  Patient Active Problem List   Diagnosis Date Noted  . GBS (Guillain-Barre syndrome) ([place]) 07/07/2019  . Hypokalemia 06/28/2019  . Weakness 06/27/2019  . Cervical spondylosis with myelopathy and radiculopathy 01/16/2019  . Spinal stenosis of lumbar region 12/24/2018  . Gastroesophageal reflux disease 06/26/2018  . Essential hypertension 06/17/2018  . Fatty liver   . Witnessed apneic spells 03/28/2017  . Prediabetes 01/18/2017  . Bronchitis 05/21/2014  . ETOH abuse 05/21/2014  . Asthma 01/15/2013  . Asthma with exacerbation 01/15/2013  . Generalized anxiety disorder 08/24/2012  . Malaise and fatigue 08/24/2012   Past Medical History:  Past Medical History:  Diagnosis Date  . Anemia    low iron  . Asthma    as a child  . Cervical spondylosis with myelopathy   . Fatty liver   . GERD (gastroesophageal reflux disease)   . Heart murmur    when he was younger   . Hypertension   . Pneumonia    Past Surgical History:  Past Surgical History:  Procedure Laterality Date  . ANTERIOR CERVICAL DECOMP/DISCECTOMY FUSION N/A 01/16/2019   Procedure: Cervical six corpectomy with Cervical five-seven fusion and plating;  Surgeon: Ashok Pall, MD;  Location: [place];  Service: Neurosurgery;  Laterality: N/A;  . NO PAST SURGERIES    . WISDOM TOOTH EXTRACTION    . WRIST SURGERY Left 2014   fx    Assessment / Plan / Recommendation Clinical Impression   HPI: Jorge Weaver is a 54 year old male with history of HTN, cervical spondylosis with myelopathy s/p decompression 12/2018, ETOH use who was admitted on 06/29/19 with increasing numbness in BUE/BLE progressing to umbilicus  and face, recurrent falls and hesitancy. He was severely hypokalemic- K-2.2 at admission which was supplemented. MRI brain was negative for acute changes. MRI cervical spine showed ACDF C5-C7 with resolved stenosis and moderate neural foraminal stenosis C5 and C8 nerve roots, severe at L-C6 nerve level. MRI lumbar spine showed progression fo DD L4/L5 with new right foraminal annular fissure and moderate right L4 foraminal stenosis, advanced L5/S1 degeneration with moderate to severe L5 foraminal stenosis.  Dr. [name] consulted and felt that surgical intervention not needed.  Dr. Lorraine Lax consulted for input and recommended full work up as well as IV thiamine due to history of alcohol abuse with evidence of macrocytic anemia and to rule out GBS as hypokalemia had improved without improvement in symptoms.  Labs for heavy metals and nutritional deficiency negative. RPR/HIV negative. LP done revealing albuminocytologic dissociation and was treated with IVIG X 5 days--completed on 4/16.   Blood cultures done 4/15 due to fevers and pending.  He developed hyponatremia with drop in Na-125 this weekend --was treated briefly with lasix, tolvaptan and IVF with improvement in sodium levels. Patient continues to be limited by neuropathy with weakness affecting functional status and CIR recommended due to functional decline on 07/07/19 and SLP evaluation completed 07/08/19 with results as follows:  Pt presents with dysarthria that fluctuated in severity throughout session (although mostly mild). Despite articulatory imprecision of consonants and occasional lateral escape of airflow from oral cavity likely due to reduced lingual sensation, pt was ~90% intelligible throughout session. He did report  that the quality of his speech impacts functional communication at times, is bothersome to him, and he would like to pursue ST interventions to learn compensatory strategies for increased intelligbility. Pt would benefit from further  education and interventions regarding dysarthria. Pt's expressive and receptive language skills were in tact throughout evaluation. Pt's cognitive function also deteremined to be [facility]. Pt scored WFL on all subtests from [facility] evaluation with the exception of short term recall subtest. However, when prompted to recall short term information more informally, pt demonstrated no difficulty. Therefore, no functional short term memory deficits noted.  Recommend pt receive skilled ST targeting dysarthria while inpatient in order to maximize functional communication.    Skilled Therapeutic Interventions          Cognitive-linguistic evaluation was administered and results were reviewed with pt (please see above for details).   SLP Assessment  Patient will need skilled [facility] during CIR admission    Recommendations  Patient destination: Home Follow up Recommendations: 24 hour supervision/assistance;Outpatient SLP Equipment Recommended: None recommended by SLP    SLP Frequency 1 to 3 out of 7 days   SLP Duration  SLP Intensity  SLP Treatment/Interventions 2 weeks  Minumum of 1-2 x/day, 30 to 90 minutes  Cueing hierarchy;Speech/Language facilitation;Patient/family education    Pain Pain Assessment Pain Scale: Faces Faces Pain Scale: No hurt      SLP Evaluation Cognition Overall Cognitive Status: Within Functional Limits for tasks assessed Arousal/Alertness: Lethargic Orientation Level: Oriented X4 Attention: Sustained;Selective Sustained Attention: Appears intact Selective Attention: Appears intact Memory: Impaired(during standard eval but recalled other new functional information without difficulty) Awareness: Appears intact Problem Solving: Appears intact Executive Function: Reasoning;Initiating;Organizing Reasoning: Appears intact Organizing: Appears intact Initiating: Appears intact Safety/Judgment: Appears intact  Comprehension Auditory  Comprehension Overall Auditory Comprehension: Appears within functional limits for tasks assessed Yes/No Questions: Within Functional Limits Commands: Within Functional Limits Conversation: Complex Visual Recognition/Discrimination Discrimination: Not tested Reading Comprehension Reading Status: Not tested Expression Expression Primary Mode of Expression: Verbal Verbal Expression Overall Verbal Expression: Appears within functional limits for tasks assessed Initiation: No impairment Level of Generative/Spontaneous Verbalization: Sentence;Conversation Repetition: No impairment Naming: No impairment Pragmatics: No impairment Non-Verbal Means of Communication: Not applicable Written Expression Written Expression: Not tested Oral Motor Oral Motor/Sensory Function Overall Oral Motor/Sensory Function: Mild impairment Facial ROM: Reduced right Facial Symmetry: Abnormal symmetry right;Suspected CN VII (facial) dysfunction Lingual Symmetry: Within Functional Limits Lingual Strength: Within Functional Limits Velum: Within Functional Limits Mandible: Within Functional Limits Motor Speech Overall Motor Speech: Impaired Respiration: Within functional limits Phonation: Normal Resonance: Within functional limits Articulation: Impaired Level of Impairment: Conversation Intelligibility: Intelligibility reduced Word: 75-100% accurate Phrase: 75-100% accurate Sentence: 75-100% accurate Conversation: 75-100% accurate Motor Planning: Witnin functional limits Motor Speech Errors: Not applicable Effective Techniques: Increased vocal intensity;Over-articulate    Intelligibility: Intelligibility reduced Word: 75-100% accurate Phrase: 75-100% accurate Sentence: 75-100% accurate Conversation: 75-100% accurate   Short Term Goals: Week 1: SLP Short Term Goal 1 (Week 1): Pt will implement inentional use of increased vocal intensity and overarticulation to increase speech intelligibility in  conversation with Supervision A verbal cues.  Refer to Care Plan for Long Term Goals  Recommendations for other services: None   Discharge Criteria: Patient will be discharged from SLP if patient refuses treatment 3 consecutive times without medical reason, if treatment goals not met, if there is a change in medical status, if patient makes no progress towards goals or if patient is discharged from hospital.  The above  assessment, treatment plan, treatment alternatives and goals were discussed and mutually agreed upon: by patient  Arbutus Leas 07/08/2019, 12:33 PM

## 2019-07-08 NOTE — Progress Notes (Signed)
RT NOTES: NIF -30, VC 2.0L. Excellent patient effort.

## 2019-07-08 NOTE — Progress Notes (Signed)
Physical Therapy Session Note  Patient Details  Name: Jorge Weaver. MRN: [id number] Date of Birth: 1965/09/22  Today's Date: 07/08/2019 PT Individual Time: 1510-1533 PT Individual Time Calculation (min): 23 min   Short Term Goals: Week 1:  PT Short Term Goal 1 (Week 1): Pt will complete least restrictive transfer with mod A PT Short Term Goal 2 (Week 1): Pt will initiate gait training as safe and able PT Short Term Goal 3 (Week 1): Pt will tolerate sitting up out of bed x 1 hour  Skilled Therapeutic Interventions/Progress Updates:   Patient received in bed asleep, easily woken and willing to participate in session. Able to complete bed mobility with ModA, then asked to use bathroom- able to perform transfer with MinAx2 with NT present into stedy, used stedy to pivot to [facility] and required Min guard for safety sitting on BSC. Able to stand with BUE support in stedy for donning/doffing brief and pants but needed Mod cues for upright posture due to tendency to hyperextend knees and flex trunk forward to counter balance in stedy.  Performed sit to stand with MinAx2 from [facility], transfer back to bed  In stedy and required ModA to get repositioned in the bed. Left in bed with all needs met, NT present and attending.   Therapy Documentation Precautions:  Precautions Precautions: Fall Precaution Comments: ataxia Restrictions Weight Bearing Restrictions: No Pain: Pain Assessment Pain Scale: 0-10 Pain Score: 0-No pain Faces Pain Scale: No hurt    Therapy/Group: Individual Therapy   Windell Norfolk, DPT, PN1   Supplemental Physical Therapist [place]    Pager [phone] Acute Rehab Office [phone]    07/08/2019, 4:01 PM

## 2019-07-09 ENCOUNTER — Inpatient Hospital Stay (HOSPITAL_COMMUNITY): Payer: BC Managed Care – PPO | Admitting: Physical Therapy

## 2019-07-09 ENCOUNTER — Encounter (HOSPITAL_COMMUNITY): Payer: BC Managed Care – PPO | Admitting: Psychology

## 2019-07-09 ENCOUNTER — Inpatient Hospital Stay (HOSPITAL_COMMUNITY): Payer: BC Managed Care – PPO | Admitting: Speech Pathology

## 2019-07-09 ENCOUNTER — Inpatient Hospital Stay (HOSPITAL_COMMUNITY): Payer: BC Managed Care – PPO

## 2019-07-09 LAB — PROCALCITONIN: Procalcitonin: 0.23 ng/mL

## 2019-07-09 LAB — MAGNESIUM: Magnesium: 2 mg/dL (ref 1.7–2.4)

## 2019-07-09 NOTE — Progress Notes (Signed)
Patient reports to nurse tech, "It burns when I pee and it has a strong odor". Nurse tech reports this statement to nurse and nurse tech agrees it has an odor, nurse is now writing progress note for physician to address.  Meredeth Ide Lailee Hoelzel

## 2019-07-09 NOTE — Progress Notes (Signed)
[facility] PHYSICAL MEDICINE & REHABILITATION PROGRESS NOTE   Subjective/Complaints:  Pt reports really likes the lidoderm patches- REALLY likes it- very helpful for back pain. Also feels tingling slightly better/equivocal- Slept great, but also sleepy this AM/wants to sleep more.   Also had lightheadedness at 107/67 this AM and was sweating. When sat up/tried to stand with PT. They are going to retry.    ROS:  Pt denies SOB, abd pain, CP, N/V/C/D, and vision changes   Objective:   No results found. Recent Labs    07/08/19 0519  WBC 5.0  HGB 10.6*  HCT 32.0*  PLT 389   Recent Labs    07/07/19 0217 07/08/19 0519  NA 129* 129*  K 4.0 3.9  CL 98 97*  CO2 23 23  GLUCOSE 122* 102*  BUN 6 7  CREATININE 0.72 0.63  CALCIUM 8.6* 8.8*    Intake/Output Summary (Last 24 hours) at 07/09/2019 1746 Last data filed at 07/09/2019 1402 Gross per 24 hour  Intake 360 ml  Output 547 ml  Net -187 ml     Physical Exam: Vital Signs Blood pressure 111/77, pulse 79, temperature 99.3 F (37.4 C), temperature source Oral, resp. rate 14, height 5\' 11"  (1.803 m), weight 115.5 kg, SpO2 99 %.   General: pt a little sleepy- PT at bedside; sweaty on face, appropriate, NAD HEENT: conjugate gaze; face numb R>L esp around lips Heart: RRR- no JVD Chest: CTA B/L- no W/R/R- good air movement Abdomen: Soft, NT, ND, (+)BS   Extremities: No clubbing, cyanosis, or edema. Pulses are 2+ Skin: Small scabs left lower lip.   Neuro/Musculoskeletal: R>L facial weakness with dysarthria.  4+/5 strength throughout. Ataxia. AOx3. A Psych: appropriate, a little sleepy   Assessment/Plan: 1. 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  Physiatrist is providing close team supervision and 24 hour management of active medical problems listed  below.  Physiatrist and rehab team continue to assess barriers to discharge/monitor patient progress toward functional and medical goals  Care Tool:  Bathing    Body parts bathed by patient: Right arm, Left arm, Chest, Abdomen, Front perineal area, Left upper leg, Right upper leg, Face   Body parts bathed by helper: Left lower leg, Right lower leg, Buttocks     Bathing assist Assist Level: Maximal Assistance - Patient 24 - 49%     Upper Body Dressing/Undressing Upper body dressing   What is the patient wearing?: Pull over shirt    Upper body assist Assist Level: Moderate Assistance - Patient 50 - 74%    Lower Body Dressing/Undressing Lower body dressing      What is the patient wearing?: Pants     Lower body assist Assist for lower body dressing: Maximal Assistance - Patient 25 - 49%     Toileting Toileting    Toileting assist Assist for toileting: 2 Helpers     Transfers Chair/bed transfer  Transfers assist     Chair/bed transfer assist level: Moderate Assistance - Patient 50 - 74%     Locomotion Ambulation   Ambulation assist   Ambulation activity did not occur: Safety/medical concerns          Walk 10 feet activity   Assist  Walk 10 feet activity did not occur: Safety/medical concerns        Walk 50 feet activity   Assist Walk 50 feet  with 2 turns activity did not occur: Safety/medical concerns         Walk 150 feet activity   Assist Walk 150 feet activity did not occur: Safety/medical concerns         Walk 10 feet on uneven surface  activity   Assist Walk 10 feet on uneven surfaces activity did not occur: Safety/medical concerns         Wheelchair     Assist Will patient use wheelchair at discharge?: Yes Type of Wheelchair: Manual    Wheelchair assist level: Supervision/Verbal cueing Max wheelchair distance: 150'    Wheelchair 50 feet with 2 turns activity    Assist        Assist Level:  Supervision/Verbal cueing   Wheelchair 150 feet activity     Assist      Assist Level: Supervision/Verbal cueing   Blood pressure 111/77, pulse 79, temperature 99.3 F (37.4 C), temperature source Oral, resp. rate 14, height 5\' 11"  (1.803 m), weight 115.5 kg, SpO2 99 %.  Medical Problem List and Plan: 1.  Impaired mobility and ADLs secondary to Guillain Barre Syndrome with miller Fischer Variant.              -patient may shower             -ELOS/Goals: 14-19 days; modI to MinAwith PT, modI to MinA with OT, modIwith SLP. 2.  Antithrombotics: -DVT/anticoagulation:  Pharmaceutical: Heparin  4/20- changed to lovenox- renal function OK             -antiplatelet therapy: N/a 3. Pain Management: Tylenol prn.  4/20- look below for changes to pain regimen  4. Mood: LCSW to follow for evaluation and support.              -antipsychotic agents: N/a 5. Neuropsych: This patient is capable of making decisions on his own behalf. 6. Skin/Wound Care: Routine pressure relief measures.  7. Fluids/Electrolytes/Nutrition: Monitor I/O. Check lytes in am--off fluid restriction now 8. HTN: Monitor BP tid--continue Hydralazine, metoprolol and amlodipine. Has been labile.   4/21- BP a little low/orthostatic hypotension this AM- will monitor- might need to decrease BP meds.  9. Hyponatremia: Likely due to GBS. Has received Tolvaptan, fluid restriction, and salt tablets. Slowly trending upward. 129 on 4/19.   4/20- Na still 129- will con't to monitor  4/21- will check in AM and see if improving.  10. H/o alcohol use: Continue Thiamine and Folic acid. boderline low Mg 1.7-1.8 range. Will add supplement.  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  12. Abnormal LFTs: Recheck in am.  13. Bladder incontinence/neurogenic bladder:   4/20- will try and get external catheter/purewick because pt has cut/abrasion on penis from condom catheter- will also check PVRs to make sure  emptying 14. Dysarthria: Severe this am with increase in right facial weakness. Discussed with Dr. Candiss Norse and Etta Quill PA for neuro.  Benadryl recommended to help with edema. They report that speech does fluctuate on and off. NIF ordered X 2 per and neuro recommends formal consult in am if speech not improved.  15. Insomnia: Continue Ambien prn  16. 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 Acute low back pain and N/T due to GBS-  4/20- will add lidoderm patches-2 12 hrs on; 12 hrs off; tramadol 50 mg q6 hours prn; and change gabapentin to lyrica  75 mg TID  4/21- a little sleepy- could be lyrica, but pain much better controlled- per pt- will monitor- might need to decrease lyrica.   LOS: 2 days A FACE TO FACE EVALUATION WAS PERFORMED  Jhana Giarratano 07/09/2019, 5:46 PM

## 2019-07-09 NOTE — Consult Note (Signed)
Neuropsychological Consultation   Patient:   Jorge Weaver.   DOB:   07/15/1965  MR Number:  [id number]  Location:  [facility] [id number] [place] [address] Dept: [place]: [phone]           Date of Service:   07/09/2019  Start Time:   2 PM End Time:   3 PM  Provider/Observer:  Ilean Skill, Psy.D.       Clinical Neuropsychologist       Billing Code/Service: [phone]  Chief Complaint:    Jorge Morado. is a 54 year old male with history of hypertension, cervical spondylosis with myelopathy and decompression on 12/2018.  Patient also has a history of alcohol use.  Patient was admitted on 06/29/2019 with increasing numbness and bilateral upper extremity/bilateral lower extremity progressing to umbilicus and face, recurrent falls and hesitancy when walking.  He was severely hypokalemic which has been supplemented.  MRI brain was negative for acute changes.  MRI cervical spine showed ACDF C5-C7 with resolved stenosis and moderate neural foraminal stenosis C5 and C8 nerve root, severe at L-C6 nerve level.  MRI lumbar spine showed evidence of degenerative disc disease moderate stenosis and advanced degeneration with moderate to severe stenosis.  Neurosurgery felt that surgical intervention was not needed.  Patient treated with IV thiamine due to history of alcohol abuse and evidence of microcytic ptotic anemia and rule out Ethelene Hal syndrome as hypokalemia had improved without improvement in symptoms.  Labs for heavy metals and nutritional deficiency negative.  Patient continues to be limited by neuropathy with weakness affecting functional status and was recommended for the comprehensive rehabilitation program due to functional decline.  Reason for Service:  Patient was referred for neuropsychological consultation due to coping and adjustment issues with extended hospital stay and  significant reduction in functional status.  Below is the HPI for the current admission.  HPI: Jorge Weaver is a 54 year old male with history of HTN, cervical spondylosis with myelopathy s/p decompression 12/2018, ETOH use who was admitted on 06/29/19 with increasing numbness in BUE/BLE progressing to umbilicus and face, recurrent falls and hesitancy. He was severely hypokalemic- K-2.2 at admission which was supplemented. MRI brain was negative for acute changes. MRI cervical spine showed ACDF C5-C7 with resolved stenosis and moderate neural foraminal stenosis C5 and C8 nerve roots, severe at L-C6 nerve level. MRI lumbar spine showed progression fo DD L4/L5 with new right foraminal annular fissure and moderate right L4 foraminal stenosis, advanced L5/S1 degeneration with moderate to severe L5 foraminal stenosis.  Dr. [name] consulted and felt that surgical intervention not needed.   Dr. Lorraine Lax consulted for input and recommended full work up as well as IV thiamine due to history of alcohol abuse with evidence of macrocytic anemia and to rule out GBS as hypokalemia had improved without improvement in symptoms.  Labs for heavy metals and nutritional deficiency negative. RPR/HIV negative. LP done revealing albuminocytologic dissociation and was treated with IVIG X 5 days--completed on 4/16.   Blood cultures done 4/15 due to fevers and pending.  He developed hyponatremia with drop in Na-125 this weekend --was treated briefly with lasix, tolvaptan and IVF with improvement in sodium levels. Patient continues to be limited by neuropathy with weakness affecting functional status and CIR recommended due to functional decline.   Current Status:  Patient reports that he is generally coping adequately with the extended hospital stay and is relieved  to some degree to have a better idea about what is causing his difficulties.  The patient was aware of issues with his back.  He acknowledged alcohol use prior.  The patient  reports that he is seeing some progress in therapy and is hoping to be able to return to work as he improves.  Behavioral Observation: Jorge Standre.  presents as a 54 y.o.-year-old Right African American Male who appeared his stated age. his dress was Appropriate and he was Well Groomed and his manners were Appropriate to the situation.  his participation was indicative of Appropriate and Attentive behaviors.  There were physical disabilities noted.  he displayed an appropriate level of cooperation and motivation.     Interactions:    Active Appropriate and Attentive  Attention:   within normal limits and attention span and concentration were age appropriate  Memory:   within normal limits; recent and remote memory intact  Visuo-spatial:  not examined  Speech (Volume):  low  Speech:   normal; slurred due to motor function deficits  Thought Process:  Coherent and Relevant  Though Content:  WNL; not suicidal and not homicidal  Orientation:   person, place, time/date and situation  Judgment:   Good  Planning:   Good  Affect:    Appropriate  Mood:    Dysphoric  Insight:   Good  Intelligence:   normal  Medical History:   Past Medical History:  Diagnosis Date  . Anemia    low iron  . Asthma    as a child  . Cervical spondylosis with myelopathy   . Fatty liver   . GERD (gastroesophageal reflux disease)   . Heart murmur    when he was younger   . Hypertension   . Pneumonia        Psychiatric History:  No prior psychiatric history  Family Med/Psych History:  Family History  Problem Relation Age of Onset  . Hypertension Mother   . Heart attack Father   . Colon cancer Sister   . Esophageal cancer Neg Hx   . Rectal cancer Neg Hx   . Stomach cancer Neg Hx     Risk of Suicide/Violence: virtually non-existent patient denies suicidal or homicidal ideation.  Impression/DX:  Jorge Exley. is a 54 year old male with history of hypertension, cervical spondylosis with  myelopathy and decompression on 12/2018.  Patient also has a history of alcohol use.  Patient was admitted on 06/29/2019 with increasing numbness and bilateral upper extremity/bilateral lower extremity progressing to umbilicus and face, recurrent falls and hesitancy when walking.  He was severely hypokalemic which has been supplemented.  MRI brain was negative for acute changes.  MRI cervical spine showed ACDF C5-C7 with resolved stenosis and moderate neural foraminal stenosis C5 and C8 nerve root, severe at L-C6 nerve level.  MRI lumbar spine showed evidence of degenerative disc disease moderate stenosis and advanced degeneration with moderate to severe stenosis.  Neurosurgery felt that surgical intervention was not needed.  Patient treated with IV thiamine due to history of alcohol abuse and evidence of microcytic ptotic anemia and rule out Katheran Awe syndrome as hypokalemia had improved without improvement in symptoms.  Labs for heavy metals and nutritional deficiency negative.  Patient continues to be limited by neuropathy with weakness affecting functional status and was recommended for the comprehensive rehabilitation program due to functional decline.  Patient reports that he is generally coping adequately with the extended hospital stay and is relieved to some degree to have  a better idea about what is causing his difficulties.  The patient was aware of issues with his back.  He acknowledged alcohol use prior.  The patient reports that he is seeing some progress in therapy and is hoping to be able to return to work as he improves.  Disposition/Plan:  Worked on coping and adjustment issues and dealing with residual motor deficits from Guillain-Barr syndrome as the likely culprit.  Will follow up with patient next week.  Diagnosis:    GBS        Electronically Signed   _______________________ Arley Phenix, Psy.D.

## 2019-07-09 NOTE — Progress Notes (Signed)
Occupational Therapy Session Note  Patient Details  Name: Jorge Weaver. MRN: [id number] Date of Birth: [birth year]  Today's Date: 07/09/2019 OT Individual Time: 1100-1155 OT Individual Time Calculation (min): 55 min    Short Term Goals: Week 1:  OT Short Term Goal 1 (Week 1): Pt will complete toilet transfer with mod A OT Short Term Goal 2 (Week 1): Pt will complete LB dressing with mod A OT Short Term Goal 3 (Week 1): Pt will complete sit<>stand at the sink with mod A in preparation for BADL: tasks  Skilled Therapeutic Interventions/Progress Updates:    Pt asleep in bed upon arrival but easily aroused.  Pt agreeable to therapy but had difficulty keeping eyes open when supine and when sitting EOB. OT intervention with focus on bed mobility and sit<>stand with [place].  Initial plan was to engage pt in bathing/dressing EOB but pt became diaphoretic immediately upon standing in [place]. BP supine at beginning of session-90/62, sitting EOB 91/75 with no s/s. Immediately on standing pt diaphoretic and returned to supine. Unable to obtain BP in standing.  BP on return to bed 116/87 with no s/s.  Bed mobility rolling R/L and repositioning in bed with min A. Pt unable to keep eyes open throughout session.  Ted hose and Ace wraps on BLE throughout session. Supine>sit EOB with min A using bed rails.  Pt noted with truncal and BUE ataxia. Pt commented that his arms felt heavy. Min A for sitting balance EOB and noted with flexed trunk but able to self correct. Sit>supine in bed with min A. Pt remained in bed with all needs within reach and bed alarm activated.    Therapy Documentation Precautions:  Precautions Precautions: Fall Precaution Comments: ataxia Restrictions Weight Bearing Restrictions: No   Pain:  Pt denies pain   Therapy/Group: Individual Therapy  [name] 07/09/2019, 12:00 PM

## 2019-07-09 NOTE — Progress Notes (Signed)
Speech Language Pathology Discharge Summary  Patient Details  Name: Jorge Weaver. MRN: [id number] Date of Birth: 04-Jul-1965  Today's Date: 07/09/2019 SLP Individual Time: 1300-1330 SLP Individual Time Calculation (min): 30 min   Skilled Therapeutic Interventions:  Review of strategies to maximize speech intelligibility. Education completed.   Patient has met 1 of 1 long term goals.  Patient to discharge at overall Modified Independent level.   Clinical Impression/Discharge Summary: Pt speech is mildly dysarthric, but fully intelligible. Pt has been educated regarding strategies to maximize intelligibility, including decreased rate of speech, overarticulation, and adequate loudness. He was receptive to education regarding potential decline in clarity of speech and swallow safety with progression of Guillain Barre. Pt indicates he does not feel the need for speech therapy at this time. Will sign off. Please reconsult if needs arise.   Recommendation:  24 hour supervision/assistance;Outpatient SLP(if needs arise)  Rationale for SLP Follow Up: Maximize functional communication;Maximize swallowing safety(if needs arise)  Reasons for discharge: Treatment goals met   Patient/Family Agrees with Progress Made and Goals Achieved: Yes   Marella Vanderpol B. Quentin Ore, [facility], CCC-SLP Speech Language Pathologist  Shonna Chock 07/09/2019, 3:24 PM

## 2019-07-09 NOTE — Progress Notes (Signed)
Physical Therapy Session Note  Patient Details  Name: Jorge Weaver. MRN: [id number] Date of Birth: 20-Sep-1965  Today's Date: 07/09/2019 PT Individual Time: 5885-0277; 1500-1540 PT Individual Time Calculation (min): 75 min and 40 min  Short Term Goals: Week 1:  PT Short Term Goal 1 (Week 1): Pt will complete least restrictive transfer with mod A PT Short Term Goal 2 (Week 1): Pt will initiate gait training as safe and able PT Short Term Goal 3 (Week 1): Pt will tolerate sitting up out of bed x 1 hour  Skilled Therapeutic Interventions/Progress Updates:    Session 1: Pt received seated in bed asleep, arousable and agreeable to therapy session. Pt reports feeling fatigued this AM and does appear more lethargic that previous date. Pt reports he slept well last night but still feels wants to sleep more this AM. Pt agreeable to participate in therapy session then rest afterwards before next session. Supine to sit with min A with HOB elevated and use of bedrail, assist needed for trunk control. Pt is min A to change shirt while seated EOB with difficulty due to BUE ataxia as well as decreased trunk control. Pt tends to lean laterally to the L while seated EOB. Pt then reports feeling lightheaded and becomes diaphoretic. Sit to supine mod A for BLE management. Pt is mod A to don shorts while supine in bed via bridging to pull pants up over hips. Supine BP 107/67. Returned to sitting and pt again becomes diaphoretic and lightheaded. Seated BP 82/64. Assisted pt with donning thigh-high TED hose for BP management. Supine BP 97/67. Assisted pt with donning BLE ACE wraps for BP management. Returned to sitting with min A and HOB elevated. Seated BP 123/72. Pt requesting to lay back down and sleep at end of session. Sit to supine mod A for BLE management. Pt left supine in bed with needs in reach, bed alarm in place at end of session. RN and primary OT notified of orthostasis this AM.  Session 2: Pt received  seated in bed, much more alert than during AM sessions. Pt agreeable to PT session, reports some pain in low back just above where lidocaine patch placed. Encouraged pt to communicate with nursing about placement of lidocaine patch. Supine BP 106/82. Supine to sit with min A and HOB elevated. Seated BP 110/84 with use of thigh-high TEDs and BLE ACE wrap. Squat pivot transfer bed to w/c with mod A. Manual w/c propulsion x 100 ft with use of BUE and min A. Pt reports having some trouble gripping w/c rims. Applied theraband to w/c rims for improved grip. Manual w/c propulsion x 150 ft with use of BUE at Supervision level with use of theraband. Pt agreeable to stay seated in w/c in room at end of session, quick release belt and chair alarm in place.  Therapy Documentation Precautions:  Precautions Precautions: Fall Precaution Comments: ataxia Restrictions Weight Bearing Restrictions: No    Therapy/Group: Individual Therapy   Peter Congo, PT, DPT  07/09/2019, 10:41 AM

## 2019-07-09 NOTE — Progress Notes (Signed)
RT NOTE:  Nif = -40 cmH20.    VC 2.1 liters.  Excellent effort by the patient.

## 2019-07-09 NOTE — Plan of Care (Signed)
  Problem: Consults Goal: RH SPINAL CORD INJURY PATIENT EDUCATION Description:  See Patient Education module for education specifics.  Outcome: Progressing Goal: Skin Care Protocol Initiated - if Braden Score 18 or less Description: If consults are not indicated, leave blank or document N/A Outcome: Progressing Goal: Nutrition Consult-if indicated Outcome: Progressing   Problem: SCI BOWEL ELIMINATION Goal: RH STG MANAGE BOWEL WITH ASSISTANCE Description: STG Manage Bowel with mod I Assistance. Outcome: Progressing Goal: RH STG MANAGE BOWEL W/EQUIPMENT W/ASSISTANCE Description: STG Manage Bowel With Equipment With Assistance Outcome: Progressing   Problem: SCI BLADDER ELIMINATION Goal: RH STG MANAGE BLADDER WITH ASSISTANCE Description: STG Manage Bladder With mod I Assistance Outcome: Progressing Goal: RH OTHER STG BLADDER ELIMINATION GOALS W/ASSIST Description: Other STG Bladder Elimination Goals With mod I Assistance Outcome: Progressing   Problem: RH SKIN INTEGRITY Goal: RH STG SKIN FREE OF INFECTION/BREAKDOWN Description: Patient will not acquire any skin break down while on IP rehab Outcome: Progressing Goal: RH STG MAINTAIN SKIN INTEGRITY WITH ASSISTANCE Description: STG Maintain Skin Integrity With mod I Assistance. Outcome: Progressing Goal: RH STG ABLE TO PERFORM INCISION/WOUND CARE W/ASSISTANCE Description: STG Able To Perform Incision/Wound Care With mod I Assistance. Outcome: Progressing   Problem: RH SAFETY Goal: RH STG ADHERE TO SAFETY PRECAUTIONS W/ASSISTANCE/DEVICE Description: STG Adhere to Safety Precautions With mod I Assistance/Device. Outcome: Progressing   Problem: RH PAIN MANAGEMENT Goal: RH STG PAIN MANAGED AT OR BELOW PT'S PAIN GOAL Description: Pain scale <4/10 Outcome: Progressing   Problem: RH KNOWLEDGE DEFICIT SCI Goal: RH STG INCREASE KNOWLEDGE OF SELF CARE AFTER SCI Description: Patient will be able to read back instructions on self care,  his condition & preventative measures by discharge Outcome: Progressing   

## 2019-07-09 NOTE — Progress Notes (Signed)
NIF -40 VC 2.2 liters   Excellent effort by the patient

## 2019-07-10 ENCOUNTER — Inpatient Hospital Stay (HOSPITAL_COMMUNITY): Payer: BC Managed Care – PPO

## 2019-07-10 LAB — CBC WITH DIFFERENTIAL/PLATELET
Abs Immature Granulocytes: 0.05 10*3/uL (ref 0.00–0.07)
Basophils Absolute: 0 10*3/uL (ref 0.0–0.1)
Basophils Relative: 1 %
Eosinophils Absolute: 0.1 10*3/uL (ref 0.0–0.5)
Eosinophils Relative: 2 %
HCT: 32.2 % — ABNORMAL LOW (ref 39.0–52.0)
Hemoglobin: 10.5 g/dL — ABNORMAL LOW (ref 13.0–17.0)
Immature Granulocytes: 1 %
Lymphocytes Relative: 27 %
Lymphs Abs: 1.4 10*3/uL (ref 0.7–4.0)
MCH: 34 pg (ref 26.0–34.0)
MCHC: 32.6 g/dL (ref 30.0–36.0)
MCV: 104.2 fL — ABNORMAL HIGH (ref 80.0–100.0)
Monocytes Absolute: 0.8 10*3/uL (ref 0.1–1.0)
Monocytes Relative: 14 %
Neutro Abs: 3 10*3/uL (ref 1.7–7.7)
Neutrophils Relative %: 55 %
Platelets: 393 10*3/uL (ref 150–400)
RBC: 3.09 MIL/uL — ABNORMAL LOW (ref 4.22–5.81)
RDW: 14.1 % (ref 11.5–15.5)
WBC: 5.4 10*3/uL (ref 4.0–10.5)
nRBC: 0 % (ref 0.0–0.2)

## 2019-07-10 LAB — BASIC METABOLIC PANEL
Anion gap: 8 (ref 5–15)
BUN: 9 mg/dL (ref 6–20)
CO2: 25 mmol/L (ref 22–32)
Calcium: 8.8 mg/dL — ABNORMAL LOW (ref 8.9–10.3)
Chloride: 101 mmol/L (ref 98–111)
Creatinine, Ser: 0.67 mg/dL (ref 0.61–1.24)
GFR calc Af Amer: >60 mL/min (ref >60)
GFR calc non Af Amer: >60 mL/min (ref >60)
Glucose, Bld: 112 mg/dL — ABNORMAL HIGH (ref 70–99)
Potassium: 4.1 mmol/L (ref 3.5–5.1)
Sodium: 134 mmol/L — ABNORMAL LOW (ref 135–145)

## 2019-07-10 LAB — MAGNESIUM: Magnesium: 2 mg/dL (ref 1.7–2.4)

## 2019-07-10 MED ORDER — PREGABALIN 75 MG PO CAPS
75.0000 mg | ORAL_CAPSULE | Freq: Every day | ORAL | Status: DC
  Administered 2019-07-11: 20:00:00 75 mg via ORAL
  Filled 2019-07-10: qty 1

## 2019-07-10 NOTE — Progress Notes (Signed)
Occupational Therapy Session Note  Patient Details  Name: Jorge Weaver. MRN: [id number] Date of Birth: February 18, 1966  Today's Date: 07/10/2019 OT Individual Time: 1300-1330 OT Individual Time Calculation (min): 30 min    Short Term Goals: Week 1:  OT Short Term Goal 1 (Week 1): Pt will complete toilet transfer with mod A OT Short Term Goal 2 (Week 1): Pt will complete LB dressing with mod A OT Short Term Goal 3 (Week 1): Pt will complete sit<>stand at the sink with mod A in preparation for BADL: tasks  Skilled Therapeutic Interventions/Progress Updates:    Pt resting in w/c upon arrival.  OT intervention with focus on [facility] tasks with theraputty and beads. Pt issued red theraputty and asked to place 10 beads in putty.  Pt required more than a reasonable amount of time to complete task and repeatedly dropped beads.  Pt persisted and was able to complete task.  After pt rolled ball in B hands pt was asked to remove beads and place in container.  Pt completed task with more then a reasonable amount of time. Pt instructed to complete task 2-3 times/day.  Pt noted with moderate ataxia during tasks.  Pt remained in w/c with all needs within reach and belt alarm activated.   Therapy Documentation Precautions:  Precautions Precautions: Fall Precaution Comments: ataxia Restrictions Weight Bearing Restrictions: No   Pain:  Pt c/o of tingling in B hands   Therapy/Group: Individual Therapy  [name] 07/10/2019, 3:07 PM

## 2019-07-10 NOTE — Progress Notes (Signed)
Physical Therapy Session Note  Patient Details  Name: Jorge Weaver. MRN: [id number] Date of Birth: 1965-04-27  Today's Date: 07/10/2019 PT Individual Time: 1100-1156 PT Individual Time Calculation (min): 56 min   Short Term Goals: Week 1:  PT Short Term Goal 1 (Week 1): Pt will complete least restrictive transfer with mod A PT Short Term Goal 2 (Week 1): Pt will initiate gait training as safe and able PT Short Term Goal 3 (Week 1): Pt will tolerate sitting up out of bed x 1 hour  Skilled Therapeutic Interventions/Progress Updates:    Pt presents in bed with ACE wraps and Teds donned. Pt reports he was able to participate this morning but still had some episodes of "sweating." BP monitored at start of session and pt asymptomatic. Used Stedy for sit <> stand and transfer to w/c for increasing upright tolerance and weightbearing through BLE. Pt demonstrates ataxia through trunk and UE's and impaired postural control or self awareness to reorient to midline with tendency for L lateral lean. Cues to correct. Supervision needed for safety due to impaired postural control and balance. Pt issued w/c gloves for improved grip as well and pt able to propel w/c with supervision and improved tolerance to hands once w/c gloves added. Pt requires extra time and some assist with donning of gloves (R hand physical assist and L hand extra time) due to decreased grip and coordination. NMR for sit <> stands and pre-gait activities in Lite Gait. Requires +2 assist for donning of sling with partial sit > stand with mod assist. Once in Lite Gait with harness and bodyweight supported, pt with noted tendency for decreased hip and trunk extension and poor ability to correct despite demonstration and cues. Unable to get mirror for visual feedback since standing tolerance is limited. Would benefit from this next time. In standing performed forward and backward stepping x several reps each and focus on achieving upright posture  as well as body over his BOS to improve overall posture. Pt difficulty with connecting why his UE's were sore upon sitting due to heavy reliance on UE's and poor postural alignment. Will continue to benefit from repetition. Education provided throughout.   119/82 mmHg; HR 81 bpm; supine in bed HOB slightly elevated 110/80 mmHg; HR = 84 bpm EOB 106/51 mmHg in w/c No reports of orthostasis with further activities including standing and pregait during session. Did not further assess BP since asymptomatic.   Therapy Documentation Precautions:  Precautions Precautions: Fall Precaution Comments: ataxia Restrictions Weight Bearing Restrictions: No    Pain:  Denies pain. Reports tingling and arms moving on their own.    Therapy/Group: Individual Therapy  Karolee Stamps Darrol Poke, PT, DPT, CBIS  07/10/2019, 12:21 PM

## 2019-07-10 NOTE — Progress Notes (Signed)
Patient sleeping not able to do NIF

## 2019-07-10 NOTE — Progress Notes (Signed)
Occupational Therapy Session Note  Patient Details  Name: Jorge Weaver. MRN: [id number] Date of Birth: December 07, 1965  Today's Date: 07/10/2019 OT Individual Time: 0900-1000 OT Individual Time Calculation (min): 60 min    Short Term Goals: Week 1:  OT Short Term Goal 1 (Week 1): Pt will complete toilet transfer with mod A OT Short Term Goal 2 (Week 1): Pt will complete LB dressing with mod A OT Short Term Goal 3 (Week 1): Pt will complete sit<>stand at the sink with mod A in preparation for BADL: tasks  Skilled Therapeutic Interventions/Progress Updates:    OT intervention with focus on bed mobility, sit<>stand in [place], BADL retraining and activity tolerance to increase independence with bADLs. Supine>sit EOB with min A.  Sit<>stand in [place] with min A. Pt completed bathing/dressing with sit<>stand from w/c. See Care Tool for assist levels. BP during session supine with no Teds or Ace wraps-93/66, sitting EOB with Teds and Ace Wraps-106/68, sitting in w/c-9/60, supine in bed at end of session-125/77. Pt diaphoretic near end of session and returned to bed.  RN notified. Pt noted with BUE ataxia but able to complete tasks.  Pt required more than a reasonable amount of time to complete tasks.  Pt remained in bed with all needs within reach and bed alarm activated.   Therapy Documentation Precautions:  Precautions Precautions: Fall Precaution Comments: ataxia Restrictions Weight Bearing Restrictions: No   Pain: Pt c/o B shin and calf tenderness in addition to "tingling" in B hands  Therapy/Group: Individual Therapy  [name] 07/10/2019, 10:05 AM

## 2019-07-10 NOTE — Progress Notes (Signed)
[facility] PHYSICAL MEDICINE & REHABILITATION PROGRESS NOTE   Subjective/Complaints:  Jorge Weaver asking for patches for low back- very helpful for pain.  Also admits feels sleepy still and hard to keep awake-  Agrees for [place] to reduce Lyrica which is likely causing sleepiness.  Can now pick up a cup and put it in cupholder- which is new.   ROS:  Jorge Weaver denies SOB, abd pain, CP, N/V/C/D, and vision changes   Objective:   No results found. Recent Labs    07/08/19 0519 07/10/19 0643  WBC 5.0 5.4  HGB 10.6* 10.5*  HCT 32.0* 32.2*  PLT 389 393   Recent Labs    07/08/19 0519 07/10/19 0643  NA 129* 134*  K 3.9 4.1  CL 97* 101  CO2 23 25  GLUCOSE 102* 112*  BUN 7 9  CREATININE 0.63 0.67  CALCIUM 8.8* 8.8*    Intake/Output Summary (Last 24 hours) at 07/10/2019 1039 Last data filed at 07/10/2019 0729 Gross per 24 hour  Intake 500 ml  Output 525 ml  Net -25 ml     Physical Exam: Vital Signs Blood pressure 126/85, pulse 93, temperature 98.4 F (36.9 C), temperature source Oral, resp. rate 14, height 5\' 11"  (1.803 m), weight 115.5 kg, SpO2 98 %.   General:Jorge Weaver more sleepy today- hard to stay awake, NAD HEENT: conjugate gaze; more facial droop noticed on R>L Heart: RRR_ no JVD Chest: CTA B/L- no W/R/R- good air movement Abdomen: Soft, NT, ND, (+)BS   Extremities: No clubbing, cyanosis, or edema. Pulses are 2+ Skin: Small scabs left lower lip.   Neuro/Musculoskeletal: R>L facial weakness with dysarthria still- no change- but control of UEs better- able to put cup into cupholder with no issues   4+/5 strength throughout. Ataxia. AOx3. A Psych: appropriate;  more sleepy   Assessment/Plan: 1. 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  Physiatrist is providing close team supervision and 24 hour management of  active medical problems listed below.  Physiatrist and rehab team continue to assess barriers to discharge/monitor patient progress toward functional and medical goals  Care Tool:  Bathing    Body parts bathed by patient: Right arm, Left arm, Chest, Abdomen, Front perineal area, Right upper leg, Left upper leg, Face   Body parts bathed by helper: Buttocks, Right lower leg, Left lower leg     Bathing assist Assist Level: Moderate Assistance - Patient 50 - 74%     Upper Body Dressing/Undressing Upper body dressing   What is the patient wearing?: Pull over shirt    Upper body assist Assist Level: Minimal Assistance - Patient > 75%    Lower Body Dressing/Undressing Lower body dressing      What is the patient wearing?: Pants     Lower body assist Assist for lower body dressing: Maximal Assistance - Patient 25 - 49%     Toileting Toileting    Toileting assist Assist for toileting: Total Assistance - Patient < 25%     Transfers Chair/bed transfer  Transfers assist     Chair/bed transfer assist level: Moderate Assistance - Patient 50 - 74%     Locomotion Ambulation   Ambulation assist   Ambulation activity did not occur: Safety/medical concerns          Walk 10 feet activity   Assist  Walk 10 feet activity did not occur: Safety/medical concerns  Walk 50 feet activity   Assist Walk 50 feet with 2 turns activity did not occur: Safety/medical concerns         Walk 150 feet activity   Assist Walk 150 feet activity did not occur: Safety/medical concerns         Walk 10 feet on uneven surface  activity   Assist Walk 10 feet on uneven surfaces activity did not occur: Safety/medical concerns         Wheelchair     Assist Will patient use wheelchair at discharge?: Yes Type of Wheelchair: Manual    Wheelchair assist level: Supervision/Verbal cueing Max wheelchair distance: 150'    Wheelchair 50 feet with 2 turns  activity    Assist        Assist Level: Supervision/Verbal cueing   Wheelchair 150 feet activity     Assist      Assist Level: Supervision/Verbal cueing   Blood pressure 126/85, pulse 93, temperature 98.4 F (36.9 C), temperature source Oral, resp. rate 14, height 5\' 11"  (1.803 m), weight 115.5 kg, SpO2 98 %.  Medical Problem List and Plan: 1.  Impaired mobility and ADLs secondary to Guillain Barre Syndrome with miller Fischer Variant.              -patient may shower             -ELOS/Goals: 14-19 days; modI to MinAwith Jorge Weaver, modI to MinA with OT, modIwith SLP. 2.  Antithrombotics: -DVT/anticoagulation:  Pharmaceutical: Heparin  4/20- changed to lovenox- renal function OK             -antiplatelet therapy: N/a 3. Pain Management: Tylenol prn.  4/20- look below for changes to pain regimen  4. Mood: LCSW to follow for evaluation and support.              -antipsychotic agents: N/a 5. Neuropsych: This patient is capable of making decisions on his own behalf. 6. Skin/Wound Care: Routine pressure relief measures.  7. Fluids/Electrolytes/Nutrition: Monitor I/O. Check lytes in am--off fluid restriction now 8. HTN: Monitor BP tid--continue Hydralazine, metoprolol and amlodipine. Has been labile.   4/21- BP a little low/orthostatic hypotension this AM- will monitor- might need to decrease BP meds.   4/22- BP better 126/85 today- will monitor- if neeb can, can decrease BP meds 9. Hyponatremia: Likely due to GBS. Has received Tolvaptan, fluid restriction, and salt tablets. Slowly trending upward. 129 on 4/19.   4/20- Na still 129- will con't to monitor  4/21- will check in AM and see if improving.  4/22- Na up to 134- doing better- con't regimen  10. H/o alcohol use: Continue Thiamine and Folic acid. boderline low Mg 1.7-1.8 range. Will add supplement.  4/22- had Jorge Weaver see Neuropsychology.   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  12.  Abnormal LFTs: Recheck in am.  13. Bladder incontinence/neurogenic bladder:   4/20- will try and get external catheter/purewick because Jorge Weaver has cut/abrasion on penis from condom catheter- will also check PVRs to make sure emptying 14. Dysarthria: Severe this am with increase in right facial weakness. Discussed with Dr. 5/20 and Thedore Mins PA for neuro.  Benadryl recommended to help with edema. They report that speech does fluctuate on and off. NIF ordered X 2 per and neuro recommends formal consult in am if speech not improved.   4/22- NIF looks good- con't to monitor- likely 5/22 variant.  15. Insomnia: Continue Ambien prn  16. Neurogenic bowel/constipation-  no control  4/20- will see if can go again on his own- if cannot, will need bowel program for awhile  17. Acute low back pain and N/T due to GBS-  4/20- will add lidoderm patches-2 12 hrs on; 12 hrs off; tramadol 50 mg q6 hours prn; and change gabapentin to lyrica 75 mg TID  4/21- a little sleepy- could be lyrica, but pain much better controlled- per Jorge Weaver- will monitor- might need to decrease lyrica.   4/22- will decrease Lyrica to 75 mg QHS due to daytime sedation.   LOS: 3 days A FACE TO FACE EVALUATION WAS PERFORMED  Akiya Morr 07/10/2019, 10:39 AM

## 2019-07-10 NOTE — IPOC Note (Signed)
Overall Plan of Care [facility]) Patient Details Name: Jorge Weaver. MRN: [id number] DOB: [birth year]  Admitting Diagnosis: GBS (Guillain-Barre syndrome) [facility])  Hospital Problems: Principal Problem:   GBS (Guillain-Barre syndrome) ([place])     Functional Problem List: Nursing Bladder, Bowel, Medication Management, Pain, Safety, Skin Integrity  PT Balance, Endurance, Motor, Safety, Sensory  OT Balance, Endurance, Motor, Sensory  SLP Linguistic  TR         Basic ADL's: OT Eating, Grooming, Bathing, Dressing, Toileting     Advanced  ADL's: OT       Transfers: PT Bed Mobility, Bed to Chair, Car, Furniture, Floor  OT Toilet, Tub/Shower     Locomotion: PT Ambulation, Emergency planning/management officer, Stairs     Additional Impairments: OT Fuctional Use of Upper Extremity  SLP Communication expression    TR      Anticipated Outcomes Item Anticipated Outcome  Self Feeding Supervision  Swallowing      Basic self-care  Supervision  Toileting  Supervision   Bathroom Transfers Supervision  Bowel/Bladder  patient will be able to have elimination needs met with mod I assistance  Transfers  min A  Locomotion  min A with LRAD  Communication  Mod I  Cognition     Pain  pain will be managed  Safety/Judgment  patient will have no falls with injury while on IP Rehab   Therapy Plan: PT Intensity: Minimum of 1-2 x/day ,45 to 90 minutes PT Frequency: 5 out of 7 days PT Duration Estimated Length of Stay: 18-21 days OT Intensity: Minimum of 1-2 x/day, 45 to 90 minutes OT Frequency: 5 out of 7 days OT Duration/Estimated Length of Stay: 2.5-3 weeks SLP Intensity: Minumum of 1-2 x/day, 30 to 90 minutes SLP Frequency: 1 to 3 out of 7 days SLP Duration/Estimated Length of Stay: 2 weeks   Due to the current state of emergency, patients may not be receiving their 3-hours of Medicare-mandated therapy.   Team Interventions: Nursing Interventions Patient/Family Education, Bladder Management,  Bowel Management, Pain Management, Medication Management, Skin Care/Wound Management, Disease Management/Prevention  PT interventions Ambulation/gait training, Balance/vestibular training, Community reintegration, Discharge planning, DME/adaptive equipment instruction, Functional electrical stimulation, Functional mobility training, Neuromuscular re-education, Pain management, Patient/family education, Psychosocial support, Splinting/orthotics, Stair training, Therapeutic Activities, Therapeutic Exercise, UE/LE Strength taining/ROM, UE/LE Coordination activities, Wheelchair propulsion/positioning  OT Interventions Training and development officer, Discharge planning, Community reintegration, Disease mangement/prevention, Engineer, drilling, Neuromuscular re-education, Functional mobility training, Pain management, Psychosocial support, Patient/family education, Self Care/advanced ADL retraining, Therapeutic Activities, Therapeutic Exercise, UE/LE Strength taining/ROM, UE/LE Coordination activities, Wheelchair propulsion/positioning  SLP Interventions Cueing hierarchy, Speech/Language facilitation, Patient/family education  TR Interventions    SW/CM Interventions Discharge Planning, Psychosocial Support, Patient/Family Education   Barriers to Discharge MD  Medical stability, Home enviroment access/loayout, Neurogenic bowel and bladder, Lack of/limited family support and guillain Film/video editor variant, likely  Nursing Incontinence bowel/bladder, fall risk  PT Decreased caregiver support, Medical stability, Home environment access/layout, Incontinence    OT      SLP      SW Home environment access/layout Patient entrance access is steep, spouse reports causing patient to fall on several occasions   Team Discharge Planning: Destination: PT-Home ,OT- Home , SLP-Home Projected Follow-up: PT-Home health PT, 24 hour supervision/assistance, OT-  Home health OT, SLP-24 hour  supervision/assistance, Outpatient SLP(if needs arise) Projected Equipment Needs: PT-Rolling walker with 5" wheels, Wheelchair (measurements), Wheelchair cushion (measurements), To be determined, OT- To be determined, SLP-None recommended by SLP Equipment Details: PT-TBD pending progress, OT-  Patient/family involved in discharge planning: PT- Patient,  OT-Patient, SLP-Patient  MD ELOS: 18-21 days Medical Rehab Prognosis:  Good Assessment:  Pt is a 54 yr old male with hx of Alcohol use and some nutritional deficiencies- also with Guillain Barre and likely [employer] Variant causing cranial nerve abnormalities. Having difficulties voiding- also has the back pain which is common in GBS- PVRs <150- using external catheter when possible.    Goals Min assist to supervision    See Team Conference Notes for weekly updates to the plan of care

## 2019-07-10 NOTE — Progress Notes (Signed)
Physical Therapy Session Note  Patient Details  Name: Jorge Weaver. MRN: [id number] Date of Birth: 1965/08/14  Today's Date: 07/10/2019 PT Individual Time: 1400-1444 PT Individual Time Calculation (min): 44 min   Short Term Goals: Week 1:  PT Short Term Goal 1 (Week 1): Pt will complete least restrictive transfer with mod A PT Short Term Goal 2 (Week 1): Pt will initiate gait training as safe and able PT Short Term Goal 3 (Week 1): Pt will tolerate sitting up out of bed x 1 hour  Skilled Therapeutic Interventions/Progress Updates:    Session focused on: squat pivot transfers x 3 during session with focus on set-up and technique and mod assist overall. Pt requires cues and assist for w/c parts management  NMR on Nustep for BLE and BUE coordination, reciprocal movement pattern retraining, and endurance x 10 min on level 4 with 1 rest break at 5 min. Cues to maintain knees in neutral  W.c mobility with BUE x 150' with supervision for functional mobility, coordination, and endurance training.  In bed at end of session pt able to perform sit to supine with supervision and assist to scoot up in the bed. All needs in reach.     Therapy Documentation Precautions:  Precautions Precautions: Fall Precaution Comments: ataxia Restrictions Weight Bearing Restrictions: No    Pain: Reports arms are worn out and sore but denies pain elsewhere.    Therapy/Group: Individual Therapy  Karolee Stamps Darrol Poke, PT, DPT, CBIS  07/10/2019, 2:50 PM

## 2019-07-10 NOTE — Progress Notes (Signed)
NIF/VC performed with excellent patient effort. NIF manometer only goes to -40 cmH20 & patient achieved that with ease. VC  2.2L

## 2019-07-11 ENCOUNTER — Inpatient Hospital Stay (HOSPITAL_COMMUNITY): Payer: BC Managed Care – PPO | Admitting: Physical Therapy

## 2019-07-11 ENCOUNTER — Inpatient Hospital Stay (HOSPITAL_COMMUNITY): Payer: BC Managed Care – PPO

## 2019-07-11 DIAGNOSIS — G610 Guillain-Barre syndrome: Secondary | ICD-10-CM | POA: Diagnosis not present

## 2019-07-11 DIAGNOSIS — R509 Fever, unspecified: Secondary | ICD-10-CM | POA: Diagnosis not present

## 2019-07-11 LAB — CBC WITH DIFFERENTIAL/PLATELET
Abs Immature Granulocytes: 0.06 10*3/uL (ref 0.00–0.07)
Basophils Absolute: 0 10*3/uL (ref 0.0–0.1)
Basophils Relative: 0 %
Eosinophils Absolute: 0 10*3/uL (ref 0.0–0.5)
Eosinophils Relative: 0 %
HCT: 33 % — ABNORMAL LOW (ref 39.0–52.0)
Hemoglobin: 11 g/dL — ABNORMAL LOW (ref 13.0–17.0)
Immature Granulocytes: 1 %
Lymphocytes Relative: 13 %
Lymphs Abs: 1 10*3/uL (ref 0.7–4.0)
MCH: 33.5 pg (ref 26.0–34.0)
MCHC: 33.3 g/dL (ref 30.0–36.0)
MCV: 100.6 fL — ABNORMAL HIGH (ref 80.0–100.0)
Monocytes Absolute: 0.7 10*3/uL (ref 0.1–1.0)
Monocytes Relative: 9 %
Neutro Abs: 5.5 10*3/uL (ref 1.7–7.7)
Neutrophils Relative %: 77 %
Platelets: 436 10*3/uL — ABNORMAL HIGH (ref 150–400)
RBC: 3.28 MIL/uL — ABNORMAL LOW (ref 4.22–5.81)
RDW: 13.9 % (ref 11.5–15.5)
WBC: 7.3 10*3/uL (ref 4.0–10.5)
nRBC: 0 % (ref 0.0–0.2)

## 2019-07-11 LAB — URINALYSIS, ROUTINE W REFLEX MICROSCOPIC
Bilirubin Urine: NEGATIVE
Glucose, UA: NEGATIVE mg/dL
Hgb urine dipstick: NEGATIVE
Ketones, ur: NEGATIVE mg/dL
Nitrite: NEGATIVE
Protein, ur: 30 mg/dL — AB
Specific Gravity, Urine: 1.017 (ref 1.005–1.030)
WBC, UA: >50 WBC/hpf — ABNORMAL HIGH (ref 0–5)
pH: 7 (ref 5.0–8.0)

## 2019-07-11 LAB — MAGNESIUM: Magnesium: 1.8 mg/dL (ref 1.7–2.4)

## 2019-07-11 MED ORDER — LIDOCAINE 5 % EX OINT
TOPICAL_OINTMENT | Freq: Four times a day (QID) | CUTANEOUS | Status: DC
  Administered 2019-07-18 – 2019-07-27 (×5): 1 via TOPICAL
  Filled 2019-07-11 (×9): qty 35.44

## 2019-07-11 MED ORDER — AMLODIPINE BESYLATE 5 MG PO TABS
5.0000 mg | ORAL_TABLET | Freq: Every day | ORAL | Status: DC
  Administered 2019-07-12: 5 mg via ORAL
  Filled 2019-07-11 (×2): qty 1

## 2019-07-11 MED ORDER — DULOXETINE HCL 30 MG PO CPEP
30.0000 mg | ORAL_CAPSULE | Freq: Every day | ORAL | Status: DC
  Administered 2019-07-11 – 2019-07-21 (×11): 30 mg via ORAL
  Filled 2019-07-11 (×11): qty 1

## 2019-07-11 MED ORDER — CEPHALEXIN 250 MG PO CAPS
500.0000 mg | ORAL_CAPSULE | Freq: Four times a day (QID) | ORAL | Status: DC
  Administered 2019-07-11 – 2019-07-25 (×55): 500 mg via ORAL
  Filled 2019-07-11 (×56): qty 2

## 2019-07-11 NOTE — Progress Notes (Signed)
[place] PHYSICAL MEDICINE & REHABILITATION PROGRESS NOTE   Subjective/Complaints:  Pt reports didn't sleep last night much at all, so very sleepy this AM. Can't sleep due to pain in hands.  Hands/arms are cold and tingling.  Sweating at random times (room set at 76 degrees but doesn't feel warm).  Also burns when he pees and makes him feel bad- thinks has UTI.  Per OT; pt's BP is dropping during therapy and making it difficult to participate.     ROS:  Pt denies SOB, abd pain, CP, N/V/C/D, and vision changes   Objective:   No results found. Recent Labs    07/10/19 0643  WBC 5.4  HGB 10.5*  HCT 32.2*  PLT 393   Recent Labs    07/10/19 0643  NA 134*  K 4.1  CL 101  CO2 25  GLUCOSE 112*  BUN 9  CREATININE 0.67  CALCIUM 8.8*    Intake/Output Summary (Last 24 hours) at 07/11/2019 0854 Last data filed at 07/11/2019 0724 Gross per 24 hour  Intake 120 ml  Output 916 ml  Net -796 ml     Physical Exam: Vital Signs Blood pressure (!) 124/96, pulse (!) 101, temperature 99.5 F (37.5 C), resp. rate 18, height 5\' 11"  (1.803 m), weight 115.5 kg, SpO2 97 %.   General:pt still sleepy- close to slurred words, so sleepy- couldn't stay awake well, OT at bedside, NAD HEENT: conjugate gaze; more facial droop noticed on R>L Heart: borderline tachycardia- regular rhythm Chest:CTA B/L- no W/R/R- good air movement Abdomen: Soft, NT, ND, (+)BS    Extremities: No clubbing, cyanosis, or edema. Pulses are 2+ Skin: Small scabs left lower lip.   Neuro/Musculoskeletal: R>L facial weakness with dysarthria still- no change- but control of UEs better- able to put cup into cupholder with no issues   4+/5 strength throughout. Ataxia. AOx3. A Psych: appropriate, still sleepy   Assessment/Plan: 1. 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  Physiatrist is providing close team supervision and 24 hour management of active medical problems listed below.  Physiatrist and rehab team continue to assess barriers to discharge/monitor patient progress toward functional and medical goals  Care Tool:  Bathing    Body parts bathed by patient: Right arm, Left arm, Chest, Abdomen, Front perineal area, Right upper leg, Left upper leg, Face   Body parts bathed by helper: Buttocks, Right lower leg, Left lower leg     Bathing assist Assist Level: Moderate Assistance - Patient 50 - 74%     Upper Body Dressing/Undressing Upper body dressing   What is the patient wearing?: Pull over shirt    Upper body assist Assist Level: Minimal Assistance - Patient > 75%    Lower Body Dressing/Undressing Lower body dressing      What is the patient wearing?: Pants     Lower body assist Assist for lower body dressing: Maximal Assistance - Patient 25 - 49%     Toileting Toileting    Toileting assist Assist for toileting: Total Assistance - Patient < 25%     Transfers Chair/bed transfer  Transfers assist     Chair/bed transfer assist level: Moderate Assistance - Patient 50 - 74%     Locomotion Ambulation   Ambulation assist   Ambulation activity did not occur: Safety/medical concerns  Assist level: 2 helpers Assistive device: Lite Gait Max distance: 4'  Walk 10 feet activity   Assist  Walk 10 feet activity did not occur: Safety/medical concerns        Walk 50 feet activity   Assist Walk 50 feet with 2 turns activity did not occur: Safety/medical concerns         Walk 150 feet activity   Assist Walk 150 feet activity did not occur: Safety/medical concerns         Walk 10 feet on uneven surface  activity   Assist Walk 10 feet on uneven surfaces activity did not occur: Safety/medical concerns         Wheelchair     Assist Will patient use wheelchair at discharge?:  Yes Type of Wheelchair: Manual    Wheelchair assist level: Supervision/Verbal cueing Max wheelchair distance: 150'    Wheelchair 50 feet with 2 turns activity    Assist        Assist Level: Supervision/Verbal cueing   Wheelchair 150 feet activity     Assist      Assist Level: Supervision/Verbal cueing   Blood pressure (!) 124/96, pulse (!) 101, temperature 99.5 F (37.5 C), resp. rate 18, height 5\' 11"  (1.803 m), weight 115.5 kg, SpO2 97 %.  Medical Problem List and Plan: 1.  Impaired mobility and ADLs secondary to Guillain Barre Syndrome with miller Fischer Variant.              -patient may shower             -ELOS/Goals: 14-19 days; modI to MinAwith PT, modI to MinA with OT, modIwith SLP. 2.  Antithrombotics: -DVT/anticoagulation:  Pharmaceutical: Heparin  4/20- changed to lovenox- renal function OK             -antiplatelet therapy: N/a 3. Pain Management: Tylenol prn.  4/20- look below for changes to pain regimen  4. Mood: LCSW to follow for evaluation and support.              -antipsychotic agents: N/a 5. Neuropsych: This patient is capable of making decisions on his own behalf. 6. Skin/Wound Care: Routine pressure relief measures.  7. Fluids/Electrolytes/Nutrition: Monitor I/O. Check lytes in am--off fluid restriction now 8. HTN: Monitor BP tid--continue Hydralazine, metoprolol and amlodipine. Has been labile.   4/21- BP a little low/orthostatic hypotension this AM- will monitor- might need to decrease BP meds.   4/22- BP better 126/85 today- will monitor- if neeb can, can decrease BP meds  4/23- reduced Norvasc to 5 mg daily and con't to monitor for orthostasis.   9. Hyponatremia: Likely due to GBS. Has received Tolvaptan, fluid restriction, and salt tablets. Slowly trending upward. 129 on 4/19.   4/20- Na still 129- will con't to monitor  4/21- will check in AM and see if improving.  4/22- Na up to 134- doing better- con't regimen  10. H/o alcohol  use: Continue Thiamine and Folic acid. boderline low Mg 1.7-1.8 range. Will add supplement.  4/22- had pt see Neuropsychology.   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  12. Abnormal LFTs: Recheck in am.  13. Bladder incontinence/neurogenic bladder:   4/20- will try and get external catheter/purewick because pt has cut/abrasion on penis from condom catheter- will also check PVRs to make sure emptying 14. Dysarthria: Severe this am with increase in right facial weakness. Discussed with Dr. Candiss Norse and Etta Quill PA for neuro.  Benadryl recommended to help with edema. They report that speech does fluctuate on and off.  NIF ordered X 2 per and neuro recommends formal consult in am if speech not improved.   4/22- NIF looks good- con't to monitor- likely [employer] variant.  4/23- ordered SLP for dysarthria  15. Insomnia: Continue Ambien prn  16. 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 Acute low back pain and N/T due to GBS-  4/20- will add lidoderm patches-2 12 hrs on; 12 hrs off; tramadol 50 mg q6 hours prn; and change gabapentin to lyrica 75 mg TID  4/21- a little sleepy- could be lyrica, but pain much better controlled- per pt- will monitor- might need to decrease lyrica.   4/22- will decrease Lyrica to 75 mg QHS due to daytime sedation.   4/23- will add lidocaine ointment 5% QID to hands and add Duloxetine 30 mg QHS   LOS: 4 days A FACE TO FACE EVALUATION WAS PERFORMED  Kameshia Madruga 07/11/2019, 8:54 AM

## 2019-07-11 NOTE — Progress Notes (Signed)
Social Work Patient ID: Jorge Weaver., male   DOB: May 09, 1965, 54 y.o.   MRN: [id number]  Updated Dr.Rodenbough of spouse concerns via e-mail. Will follow up with spouse and Dr. Kieth Brightly

## 2019-07-11 NOTE — Progress Notes (Signed)
Physical Therapy Session Note  Patient Details  Name: Jorge Weaver. MRN: [id number] Date of Birth: 10-21-1965  Today's Date: 07/11/2019 PT Missed Time: 75 min Missed Time Reason: patient unwilling to participate; pain     Short Term Goals: Week 1:  PT Short Term Goal 1 (Week 1): Pt will complete least restrictive transfer with mod A PT Short Term Goal 2 (Week 1): Pt will initiate gait training as safe and able PT Short Term Goal 3 (Week 1): Pt will tolerate sitting up out of bed x 1 hour  Skilled Therapeutic Interventions/Progress Updates:    Attempted to see patient for scheduled AM session. Pt asleep in bed, arousable but remains lethargic and reports pain in B hands described as "sharp and tingling". MD aware and MD to change medication to address pain. Pt declines any participation in therapy this AM due to fatigue and due to ongoing pain in hands. Encouraged pt to participate in therapy to prevent decline in overall function, pt again declines. Pt left sidelying in bed with needs in reach.  Therapy Documentation Precautions:  Precautions Precautions: Fall Precaution Comments: ataxia Restrictions Weight Bearing Restrictions: No    Therapy/Group: Individual Therapy   Peter Congo, PT, DPT  07/11/2019, 7:42 AM

## 2019-07-11 NOTE — Progress Notes (Signed)
Occupational Therapy Session Note  Patient Details  Name: Jorge Weaver. MRN: [id number] Date of Birth: 1965-09-07  Today's Date: 07/11/2019 OT Individual Time: 7654-6503 OT Individual Time Calculation (min): 15 min  and Today's Date: 07/11/2019 OT Missed Time: 45 Minutes Missed Time Reason: Pain;Patient fatigue   Short Term Goals: Week 1:  OT Short Term Goal 1 (Week 1): Pt will complete toilet transfer with mod A OT Short Term Goal 2 (Week 1): Pt will complete LB dressing with mod A OT Short Term Goal 3 (Week 1): Pt will complete sit<>stand at the sink with mod A in preparation for BADL: tasks  Skilled Therapeutic Interventions/Progress Updates:    Pt asleep upon arrival but easily aroused.  MD present. Pt states he wasn't able to rest during the night 2/2 ongoing and increased pain in B hands.  MD making medication adjustments.  Pt had not eaten breakfast and states his hands hurt too much to try. Attempted to engage pt in OOB activities (BADLs) or sitting EOB but pt states pain in his hands is too much and he didn't rest during night.  Reviewed LTGs with pt and purpose of rehab.  Pt verbalized understanding but continued to decline 2/2 pain in hands.  Pt remained in bed with all needs within reach and bed alarm activated.  Therapy Documentation Precautions:  Precautions Precautions: Fall Precaution Comments: ataxia Restrictions Weight Bearing Restrictions: No General: General OT Amount of Missed Time: 45 Minutes Pain: Pain Assessment Pain Scale: 0-10 Pain Score: 10-Worst pain ever Pain Type: Acute pain Pain Location: Hand Pain Orientation: Right;Left Pain Descriptors / Indicators: Constant Pain Frequency: Constant Pain Onset: On-going Patients Stated Pain Goal: 2 Pain Intervention(s): MD present, emotional support   Therapy/Group: Individual Therapy  [name] 07/11/2019, 8:22 AM

## 2019-07-11 NOTE — Progress Notes (Signed)
Occupational Therapy Note  Patient Details  Name: Jorge Weaver. MRN: [id number] Date of Birth: 07-14-1965  Missed time 60 min  Upon entering room after knocking loudly and stating pt name, pt unaroused and audibly snoring. This clinician aware that pt did not sleep last night and refused other tx to attempt to rest earlier. Pt missed 60 min skilled OT d/t fatigue, will follow up later in the afternoon to see if able to be aroused.    Jorge Weaver 07/11/2019, 2:04 PM

## 2019-07-11 NOTE — Progress Notes (Signed)
NIF Greater than -40  Strong effort x 3 tries  VC 2.5L  Strong effort

## 2019-07-11 NOTE — Progress Notes (Addendum)
NIF was -40 this evening with good patient effort VC was 2.61L this evening with good patient effort

## 2019-07-12 MED ORDER — PREGABALIN 50 MG PO CAPS
100.0000 mg | ORAL_CAPSULE | Freq: Every day | ORAL | Status: DC
  Administered 2019-07-12 – 2019-07-19 (×8): 100 mg via ORAL
  Filled 2019-07-12 (×8): qty 2

## 2019-07-12 NOTE — Progress Notes (Signed)
Occupational Therapy Session Note  Patient Details  Name: Jorge Weaver. MRN: [id number] Date of Birth: 08-06-1965  Today's Date: 07/12/2019 OT Individual Time: 0830-0900 OT Individual Time Calculation (min): 30 min    Short Term Goals: Week 1:  OT Short Term Goal 1 (Week 1): Pt will complete toilet transfer with mod A OT Short Term Goal 2 (Week 1): Pt will complete LB dressing with mod A OT Short Term Goal 3 (Week 1): Pt will complete sit<>stand at the sink with mod A in preparation for BADL: tasks  Skilled Therapeutic Interventions/Progress Updates:     1;1. Pt agreeable ot OT make up session this morning requesting to change clothing and sit up for breakfast. Pt requires MOD A for supine>sitting EOB with A to scoot buttocks around. Pt completes UB dressing with MIN A overall to doff and don new shirt and OT dons teds, ace and socks. Pt able ot thread BLE and sit to stand with S in stedy with A to advance pants past hips. Exited session with pt seated in bed, exit alarm on and call light in reach  BP as follows: Supine 118/88 EOB: 104/81- dizzy EOB with teds: 95/81 EOB teds and ace: 91/79   Therapy Documentation Precautions:  Precautions Precautions: Fall Precaution Comments: ataxia Restrictions Weight Bearing Restrictions: No General:   Vital Signs: Therapy Vitals Temp: 98.7 F (37.1 C) Temp Source: Oral Pulse Rate: 87 Resp: 18 BP: (!) 133/92 Patient Position (if appropriate): Lying Oxygen Therapy SpO2: 100 % O2 Device: Room Air Pain:   ADL: ADL Eating: Minimal assistance Grooming: Moderate assistance Upper Body Bathing: Moderate assistance Lower Body Bathing: Maximal assistance Upper Body Dressing: Moderate assistance Lower Body Dressing: Maximal assistance Toileting: Maximal assistance Toilet Transfer: Maximal verbal cueing Tub/Shower Transfer: Maximal assistance Vision   Perception    Praxis   Exercises:   Other Treatments:      Therapy/Group: Individual Therapy  Shon Hale 07/12/2019, 8:59 AM

## 2019-07-12 NOTE — Progress Notes (Signed)
4/24 PM Nif pt was able to do 50 three consecutive times. J Caedin Mogan RT

## 2019-07-12 NOTE — Progress Notes (Signed)
[place] PHYSICAL MEDICINE & REHABILITATION PROGRESS NOTE   Subjective/Complaints: Patient without complaints today.  Slept poorly last night because of hand pain and tingling  ROS:  Pt denies SOB, abd pain, CP, N/V/C/D, and vision changes   Objective:   DG Chest 2 View  Result Date: 07/11/2019 CLINICAL DATA:  Fever, no chest pain or shortness of breath EXAM: CHEST - 2 VIEW COMPARISON:  Radiograph 06/14/2018 FINDINGS: Chronic elevation the right hemidiaphragm with some adjacent atelectatic changes. No consolidation, features of edema, pneumothorax, or effusion. The cardiomediastinal contours are unremarkable. No acute osseous or soft tissue abnormality. Degenerative changes are present in the imaged spine and shoulders. Prior cervical fusion hardware is noted. IMPRESSION: Chronic elevation of the right hemidiaphragm with adjacent atelectatic changes. No acute cardiopulmonary findings. Electronically Signed   By: Kreg Shropshire M.D.   On: 07/11/2019 18:12   Recent Labs    07/10/19 0643 07/11/19 1601  WBC 5.4 7.3  HGB 10.5* 11.0*  HCT 32.2* 33.0*  PLT 393 436*   Recent Labs    07/10/19 0643  NA 134*  K 4.1  CL 101  CO2 25  GLUCOSE 112*  BUN 9  CREATININE 0.67  CALCIUM 8.8*    Intake/Output Summary (Last 24 hours) at 07/12/2019 1330 Last data filed at 07/12/2019 0830 Gross per 24 hour  Intake 100 ml  Output 466 ml  Net -366 ml     Physical Exam: Vital Signs Blood pressure (!) 133/103, pulse 98, temperature 98.7 F (37.1 C), temperature source Oral, resp. rate 18, height 5\' 11"  (1.803 m), weight 115.5 kg, SpO2 100 %.   General: No acute distress Mood and affect are appropriate Heart: Regular rate and rhythm no rubs murmurs or extra sounds Lungs: Clear to auscultation, breathing unlabored, no rales or wheezes Abdomen: Positive bowel sounds, soft nontender to palpation, nondistended Extremities: No clubbing, cyanosis, or edema Skin: No evidence of breakdown, no  evidence of rash Neurologic: Cranial nerves II through XII intact, motor strength is 3-/5 in bilateral deltoid, bicep, tricep, grip, 2-hip flexor, knee extensors, ankle dorsiflexor and plantar flexor Sensory exam paresthesias to touch in the hands and feet. Cerebellar exam mild dysmetria  finger to nose to finger as well as heel to shin in bilateral upper and lower extremities Musculoskeletal: Full range of motion in all 4 extremities. No joint swelling   Assessment/Plan: 1. 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  Physiatrist is providing close team supervision and 24 hour management of active medical problems listed below.  Physiatrist and rehab team continue to assess barriers to discharge/monitor patient progress toward functional and medical goals  Care Tool:  Bathing    Body parts bathed by patient: Right arm, Left arm, Chest, Abdomen, Front perineal area, Right upper leg, Left upper leg, Face   Body parts bathed by helper: Buttocks, Right lower leg, Left lower leg     Bathing assist Assist Level: Moderate Assistance - Patient 50 - 74%     Upper Body Dressing/Undressing Upper body dressing   What is the patient wearing?: Pull over shirt    Upper body assist Assist Level: Minimal Assistance - Patient > 75%    Lower Body Dressing/Undressing Lower body dressing      What is the patient wearing?: Pants     Lower body assist Assist for lower body dressing: Maximal Assistance - Patient 25 -  49%     Toileting Toileting    Toileting assist Assist for toileting: Total Assistance - Patient < 25%     Transfers Chair/bed transfer  Transfers assist     Chair/bed transfer assist level: Moderate Assistance - Patient 50 - 74%     Locomotion Ambulation   Ambulation assist   Ambulation activity  did not occur: Safety/medical concerns  Assist level: 2 helpers Assistive device: Lite Gait Max distance: 4'   Walk 10 feet activity   Assist  Walk 10 feet activity did not occur: Safety/medical concerns        Walk 50 feet activity   Assist Walk 50 feet with 2 turns activity did not occur: Safety/medical concerns         Walk 150 feet activity   Assist Walk 150 feet activity did not occur: Safety/medical concerns         Walk 10 feet on uneven surface  activity   Assist Walk 10 feet on uneven surfaces activity did not occur: Safety/medical concerns         Wheelchair     Assist Will patient use wheelchair at discharge?: Yes Type of Wheelchair: Manual    Wheelchair assist level: Supervision/Verbal cueing Max wheelchair distance: 150'    Wheelchair 50 feet with 2 turns activity    Assist        Assist Level: Supervision/Verbal cueing   Wheelchair 150 feet activity     Assist      Assist Level: Supervision/Verbal cueing   Blood pressure (!) 133/103, pulse 98, temperature 98.7 F (37.1 C), temperature source Oral, resp. rate 18, height 5\' 11"  (1.803 m), weight 115.5 kg, SpO2 100 %.  Medical Problem List and Plan: 1.  Impaired mobility and ADLs secondary to Guillain Barre Syndrome with miller Fischer Variant.              -patient may shower             -ELOS/Goals: 14-19 days; modI to MinAwith PT, modI to MinA with OT, modIwith SLP. Wife asked if son could visit x 1 , son will not be a caregiver therefore not meeting criteria for exception to visitation rules 2.  Antithrombotics: -DVT/anticoagulation:  Pharmaceutical: Heparin  4/20- changed to lovenox- renal function OK             -antiplatelet therapy: N/a 3. Pain Management: Tylenol prn.  4/20- look below for changes to pain regimen  Neurogenic pain in hands will increase lyrica to 100mg  at noc 4. Mood: LCSW to follow for evaluation and support.               -antipsychotic agents: N/a 5. Neuropsych: This patient is capable of making decisions on his own behalf. 6. Skin/Wound Care: Routine pressure relief measures.  7. Fluids/Electrolytes/Nutrition: Monitor I/O. Check lytes in am--off fluid restriction now 8. HTN: Monitor BP tid--continue Hydralazine, metoprolol and amlodipine. Has been labile.     4/23- reduced Norvasc to 5 mg daily and con't to monitor for orthostasis.  Vitals:   07/12/19 1057 07/12/19 1320  BP: (!) 133/103 118/88  Pulse: 98 78  Resp:  16  Temp:  98.4 F (36.9 C)  SpO2:  97%    9. Hyponatremia: Likely due to GBS. Has received Tolvaptan, fluid restriction, and salt tablets. Slowly trending upward. 129 on 4/19.     4/22- Na up to 134- doing better- con't regimen  10. H/o alcohol use: Continue Thiamine and Folic acid. boderline low  Mg 1.7-1.8 range. Will add supplement.  4/22- had pt see Neuropsychology.   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  12. Abnormal LFTs: Recheck in am.  13. Bladder incontinence/neurogenic bladder:   4/20- will try and get external catheter/purewick because pt has cut/abrasion on penis from condom catheter- will also check PVRs to make sure emptying 14. Dysarthria: Severe this am with increase in right facial weakness. Discussed with Dr. Candiss Norse and Etta Quill PA for neuro.  Benadryl recommended to help with edema. They report that speech does fluctuate on and off. NIF ordered X 2 per and neuro recommends formal consult in am if speech not improved.   4/22- NIF looks good- con't to monitor- likely [employer] variant.  4/23- ordered SLP for dysarthria  15. Insomnia: Continue Ambien prn  16. 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 Acute low back pain and N/T due to GBS-  4/20- will add lidoderm patches-2 12 hrs on; 12 hrs off; tramadol 50 mg q6 hours prn; and  change gabapentin to lyrica 75 mg TID  4/21- a little sleepy- could be lyrica, but pain much better controlled- per pt- will monitor- might need to decrease lyrica.   4/22- will decrease Lyrica to 75 mg QHS due to daytime sedation.   4/23- will add lidocaine ointment 5% QID to hands and add Duloxetine 30 mg QHS   LOS: 5 days A FACE TO FACE EVALUATION WAS PERFORMED  Charlett Blake 07/12/2019, 1:30 PM

## 2019-07-12 NOTE — Progress Notes (Signed)
PT has a very strong effort  NIF greater than -40 VC 2.5L

## 2019-07-13 ENCOUNTER — Inpatient Hospital Stay (HOSPITAL_COMMUNITY): Payer: BC Managed Care – PPO | Admitting: Occupational Therapy

## 2019-07-13 ENCOUNTER — Inpatient Hospital Stay (HOSPITAL_COMMUNITY): Payer: BC Managed Care – PPO | Admitting: Physical Therapy

## 2019-07-13 ENCOUNTER — Inpatient Hospital Stay (HOSPITAL_COMMUNITY): Payer: BC Managed Care – PPO

## 2019-07-13 LAB — METHYLMALONIC ACID(MMA), RND URINE
Creatinine(Crt), U: 0.85 g/L (ref 0.30–3.00)
MMA - Normalized: 0.8 umol/mmol cr (ref 0.3–2.8)
Methylmalonic Acid, Ur: 6.1 umol/L (ref 1.6–29.7)

## 2019-07-13 NOTE — Progress Notes (Signed)
Occupational Therapy Session Note  Patient Details  Name: Jorge Weaver. MRN: [id number] Date of Birth: 07/30/1965  Today's Date: 07/13/2019 OT Individual Time: 8786-7672 OT Individual Time Calculation (min): 57 min   Short Term Goals: Week 1:  OT Short Term Goal 1 (Week 1): Pt will complete toilet transfer with mod A OT Short Term Goal 2 (Week 1): Pt will complete LB dressing with mod A OT Short Term Goal 3 (Week 1): Pt will complete sit<>stand at the sink with mod A in preparation for BADL: tasks  Skilled Therapeutic Interventions/Progress Updates:    Pt greeted in bed, reporting his hand pain was "half" of what it was yesterday, able to eat his breakfast this morning. When RN arrived at start of session, he requested some pain medicine for his back. After pt took his medicine, he requested to shower. Supine<sit completed with CGA with HOB elevated and Mod A for sit<stand in [place]. Pt wanted to attempt to have a BM before shower and completed transfer to standard toilet with Mod A for eccentric control when lowering. Pt ultimately unable to void bladder or bowels. Transitioned to TTB for shower after OT completed perihygiene while he stood in [place] to ensure cleanliness. Worked on UB coordination and sitting balance while he engaged in bathing tasks, pt dropping his bottle of body wash x1 and also wash cloth x1 due to ataxia. OT assisted with washing his feet for safety. Near end of shower pt was c/o increased dizziness. OT donned thigh high Teds during dressing tasks sit<stand from TTB using Stedy. Pt able to thread both LEs into pants. Min A for overhead shirt. Once again pt required Mod A for power up from lower transfer surface. He wanted to try to have a BM again, so he transferred via Stedy back to the toilet. Pt reported his dizziness was "ok" at this time. Pt left in care of NT to continue voiding. Notified NT that he wanted to return to bed afterwards.   Therapy  Documentation Precautions:  Precautions Precautions: Fall Precaution Comments: ataxia Restrictions Weight Bearing Restrictions: No Vital Signs: Therapy Vitals Temp: 97.9 F (36.6 C) Temp Source: Oral Pulse Rate: 84 Resp: 20 BP: 111/80 Patient Position (if appropriate): Lying Oxygen Therapy SpO2: 98 % ADL: ADL Eating: Minimal assistance Grooming: Moderate assistance Upper Body Bathing: Moderate assistance Lower Body Bathing: Maximal assistance Upper Body Dressing: Moderate assistance Lower Body Dressing: Maximal assistance Toileting: Maximal assistance Toilet Transfer: Maximal verbal cueing Tub/Shower Transfer: Maximal assistance     Therapy/Group: Individual Therapy  Oluwaferanmi Wain A Quadasia Newsham 07/13/2019, 12:31 PM

## 2019-07-13 NOTE — Progress Notes (Signed)
Physical Therapy Session Note  Patient Details  Name: Jorge Weaver. MRN: [id number] Date of Birth: 01-10-1966  Today's Date: 07/13/2019 PT Individual Time: 1010-1105 PT Individual Time Calculation (min): 55 min   Short Term Goals: Week 1:  PT Short Term Goal 1 (Week 1): Pt will complete least restrictive transfer with mod A PT Short Term Goal 2 (Week 1): Pt will initiate gait training as safe and able PT Short Term Goal 3 (Week 1): Pt will tolerate sitting up out of bed x 1 hour  Skilled Therapeutic Interventions/Progress Updates:  Pt was seen bedside in the am. Ted hose already on, ace wraps applied by therapist. Pt transferred supine to edge of bed with side rail, head of bed elevated and S with increased time. Pt transferred edge of bed to w/c with mod A and verbal cues. BP sitting in w/c 101/72 with HR 89.  Pt propelled w/c about 75 feet with B UEs. In parallel bars pt stood with mod A and verbal cues. Once standing pt able to maintain about 30 seconds with min A and verbal cues. Once pt returned to w/c, pt c/o dizziness. BP 93/63 with HR 82. Dizziness improved a little but didn't go away. BP retaken 87/67 with HR 83. Pt returned to room and notified nurse of patient's complaints. Pt transferred back to bed with mod A and verbal cues. Pt transferred edge of bed to supine with min A and verbal cues. Pt moved up in bed with min A and verbal cues. BP in bed 111/80. Removed ace wraps. Pt left sitting up in bed with all needs within reach. Pt's nurse to notify MD.   Therapy Documentation Precautions:  Precautions Precautions: Fall Precaution Comments: ataxia Restrictions Weight Bearing Restrictions: No General:   Pain: No c/o pain.   Therapy/Group: Individual Therapy  Rayford Halsted 07/13/2019, 12:28 PM

## 2019-07-13 NOTE — Progress Notes (Signed)
Physical Therapy Session Note  Patient Details  Name: Jorge Weaver. MRN: [id number] Date of Birth: Nov 09, 1965  Today's Date: 07/13/2019 PT Missed Time: 60 Minutes Missed Time Reason: Other (Comment)(low blood pressure)  Short Term Goals: Week 1:  PT Short Term Goal 1 (Week 1): Pt will complete least restrictive transfer with mod A PT Short Term Goal 2 (Week 1): Pt will initiate gait training as safe and able PT Short Term Goal 3 (Week 1): Pt will tolerate sitting up out of bed x 1 hour  Skilled Therapeutic Interventions/Progress Updates:   Pt supine in bed upon PT arrival, pt continues with low blood pressure and reports not feeling well this afternoon  - dizzy, lightheaded. Pt requesting to rest this afternoon, missed 60 minutes of skilled therapy tx secondary to low blood pressure - (BP 87/67).    Therapy Documentation Precautions:  Precautions Precautions: Fall Precaution Comments: ataxia Restrictions Weight Bearing Restrictions: No    Therapy/Group: Individual Therapy  Cresenciano Genre, PT, DPT, CSRS 07/13/2019, 12:16 PM

## 2019-07-13 NOTE — Progress Notes (Addendum)
[facility] PHYSICAL MEDICINE & REHABILITATION PROGRESS NOTE   Subjective/Complaints: Improved hand pain and tingling + constipation, Lg result after supp this am  Badder doing ok   ROS:  Pt denies SOB, abd pain, CP, N/V/D   Objective:   DG Chest 2 View  Result Date: 07/11/2019 CLINICAL DATA:  Fever, no chest pain or shortness of breath EXAM: CHEST - 2 VIEW COMPARISON:  Radiograph 06/14/2018 FINDINGS: Chronic elevation the right hemidiaphragm with some adjacent atelectatic changes. No consolidation, features of edema, pneumothorax, or effusion. The cardiomediastinal contours are unremarkable. No acute osseous or soft tissue abnormality. Degenerative changes are present in the imaged spine and shoulders. Prior cervical fusion hardware is noted. IMPRESSION: Chronic elevation of the right hemidiaphragm with adjacent atelectatic changes. No acute cardiopulmonary findings. Electronically Signed   By: Lovena Le M.D.   On: 07/11/2019 18:12   Recent Labs    07/11/19 1601  WBC 7.3  HGB 11.0*  HCT 33.0*  PLT 436*   No results for input(s): NA, K, CL, CO2, GLUCOSE, BUN, CREATININE, CALCIUM in the last 72 hours.  Intake/Output Summary (Last 24 hours) at 07/13/2019 1027 Last data filed at 07/13/2019 0745 Gross per 24 hour  Intake 200 ml  Output 150 ml  Net 50 ml     Physical Exam: Vital Signs Blood pressure 93/64, pulse 90, temperature 98.3 F (36.8 C), temperature source Oral, resp. rate 18, height 5\' 11"  (1.803 m), weight 115.5 kg, SpO2 98 %.   General: No acute distress Mood and affect are appropriate Heart: Regular rate and rhythm no rubs murmurs or extra sounds Lungs: Clear to auscultation, breathing unlabored, no rales or wheezes Abdomen: Positive bowel sounds, soft nontender to palpation, nondistended Extremities: No clubbing, cyanosis, or edema    Neurologic: Cranial nerves II through XII intact, motor strength is 3-/5 in bilateral deltoid, bicep, tricep, grip, 2-hip  flexor, knee extensors, ankle dorsiflexor and plantar flexor Sensory exam paresthesias to touch in the hands and feet. Cerebellar exam mild dysmetria  finger to nose to finger as well as heel to shin in bilateral upper and lower extremities Musculoskeletal: Full range of motion in all 4 extremities. No joint swelling   Assessment/Plan: 1. 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  Physiatrist is providing close team supervision and 24 hour management of active medical problems listed below.  Physiatrist and rehab team continue to assess barriers to discharge/monitor patient progress toward functional and medical goals  Care Tool:  Bathing    Body parts bathed by patient: Right arm, Left arm, Chest, Abdomen, Front perineal area, Right upper leg, Left upper leg, Face   Body parts bathed by helper: Buttocks, Right lower leg, Left lower leg     Bathing assist Assist Level: Moderate Assistance - Patient 50 - 74%     Upper Body Dressing/Undressing Upper body dressing   What is the patient wearing?: Pull over shirt    Upper body assist Assist Level: Minimal Assistance - Patient > 75%    Lower Body Dressing/Undressing Lower body dressing      What is the patient wearing?: Pants, Incontinence brief     Lower body assist Assist for lower body dressing: Maximal Assistance - Patient 25 - 49%     Toileting Toileting    Toileting assist Assist for toileting: Dependent - Patient 0%(using Stedy)     Transfers Chair/bed  transfer  Transfers assist     Chair/bed transfer assist level: Moderate Assistance - Patient 50 - 74%     Locomotion Ambulation   Ambulation assist   Ambulation activity did not occur: Safety/medical concerns  Assist level: 2 helpers Assistive device: Lite Gait Max distance: 4'    Walk 10 feet activity   Assist  Walk 10 feet activity did not occur: Safety/medical concerns        Walk 50 feet activity   Assist Walk 50 feet with 2 turns activity did not occur: Safety/medical concerns         Walk 150 feet activity   Assist Walk 150 feet activity did not occur: Safety/medical concerns         Walk 10 feet on uneven surface  activity   Assist Walk 10 feet on uneven surfaces activity did not occur: Safety/medical concerns         Wheelchair     Assist Will patient use wheelchair at discharge?: Yes Type of Wheelchair: Manual    Wheelchair assist level: Supervision/Verbal cueing Max wheelchair distance: 150'    Wheelchair 50 feet with 2 turns activity    Assist        Assist Level: Supervision/Verbal cueing   Wheelchair 150 feet activity     Assist      Assist Level: Supervision/Verbal cueing   Blood pressure 93/64, pulse 90, temperature 98.3 F (36.8 C), temperature source Oral, resp. rate 18, height 5\' 11"  (1.803 m), weight 115.5 kg, SpO2 98 %.  Medical Problem List and Plan: 1.  Impaired mobility and ADLs secondary to Guillain Barre Syndrome with miller Fischer Variant.              -patient may shower              -ELOS/Goals: 14-19 days; continue CIR PT OT speech Wife asked if son could visit x 1 , son will not be a caregiver therefore not meeting criteria for exception to visitation rules 2.  Antithrombotics: -DVT/anticoagulation:  Pharmaceutical: Heparin  4/20- changed to lovenox- renal function OK             -antiplatelet therapy: N/a 3. Pain Management: Tylenol prn.  4/20- look below for changes to pain regimen  Neurogenic pain in hands improved with increase lyrica to 100mg  at noc 4. Mood: LCSW to follow for evaluation and support.              -antipsychotic agents: N/a 5. Neuropsych: This patient is capable of making decisions on his own behalf. 6. Skin/Wound Care: Routine pressure relief  measures.  7. Fluids/Electrolytes/Nutrition: Monitor I/O. Check lytes in am--off fluid restriction now 8. HTN: Monitor BP tid--continue Hydralazine, metoprolol and amlodipine. Has been labile.     Was called about low blood pressure in physical therapy, systolic was in the 80s.  Patient felt dizzy.  He felt better once he was placed into the bed and is blood pressures came up in the 1 10-1 20 range.  Will discontinue amlodipine Vitals:   07/13/19 0350 07/13/19 0742  BP: 126/89 93/64  Pulse: 82 90  Resp: 18   Temp: 98.3 F (36.8 C)   SpO2: 98%     9. Hyponatremia: Likely due to GBS. Has received Tolvaptan, fluid restriction, and salt tablets. Slowly trending upward. 129 on 4/19.     4/22- Na up to 134- doing better- con't regimen  10. H/o alcohol use: Continue Thiamine and Folic acid. boderline low Mg 1.7-1.8  range. Will add supplement.  4/22- had pt see Neuropsychology.   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  12. Abnormal LFTs: Recheck in am.  13. Bladder incontinence/neurogenic bladder:   4/20- will try and get external catheter/purewick because pt has cut/abrasion on penis from condom catheter- will also check PVRs to make sure emptying 14. Dysarthria: Severe this am with increase in right facial weakness. Discussed with Dr. Thedore Mins and Felicie Morn PA for neuro.  Benadryl recommended to help with edema. They report that speech does fluctuate on and off. NIF ordered X 2 per and neuro recommends formal consult in am if speech not improved.   NIF and vital capacity have been normal and stable.  Will discontinue 15. Insomnia: Continue Ambien prn  16. 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 Acute low back pain and N/T due to GBS-  4/20- will add lidoderm patches-2 12 hrs on; 12 hrs off; tramadol 50 mg q6 hours prn; and change gabapentin to lyrica 75 mg  TID  4/21- a little sleepy- could be lyrica, but pain much better controlled- per pt- will monitor- might need to decrease lyrica.   4/22- will decrease Lyrica to 75 mg QHS due to daytime sedation.   4/23- will add lidocaine ointment 5% QID to hands and add Duloxetine 30 mg QHS   LOS: 6 days A FACE TO FACE EVALUATION WAS PERFORMED  Erick Colace 07/13/2019, 10:27 AM

## 2019-07-14 ENCOUNTER — Inpatient Hospital Stay (HOSPITAL_COMMUNITY): Payer: BC Managed Care – PPO | Admitting: Physical Therapy

## 2019-07-14 ENCOUNTER — Inpatient Hospital Stay (HOSPITAL_COMMUNITY): Payer: BC Managed Care – PPO | Admitting: Occupational Therapy

## 2019-07-14 LAB — CBC
HCT: 32.7 % — ABNORMAL LOW (ref 39.0–52.0)
Hemoglobin: 10.8 g/dL — ABNORMAL LOW (ref 13.0–17.0)
MCH: 33.1 pg (ref 26.0–34.0)
MCHC: 33 g/dL (ref 30.0–36.0)
MCV: 100.3 fL — ABNORMAL HIGH (ref 80.0–100.0)
Platelets: 472 10*3/uL — ABNORMAL HIGH (ref 150–400)
RBC: 3.26 MIL/uL — ABNORMAL LOW (ref 4.22–5.81)
RDW: 13.8 % (ref 11.5–15.5)
WBC: 4.9 10*3/uL (ref 4.0–10.5)
nRBC: 0 % (ref 0.0–0.2)

## 2019-07-14 LAB — URINE CULTURE: Culture: >=100000 — AB

## 2019-07-14 LAB — BASIC METABOLIC PANEL
Anion gap: 7 (ref 5–15)
BUN: 9 mg/dL (ref 6–20)
CO2: 28 mmol/L (ref 22–32)
Calcium: 9 mg/dL (ref 8.9–10.3)
Chloride: 97 mmol/L — ABNORMAL LOW (ref 98–111)
Creatinine, Ser: 0.65 mg/dL (ref 0.61–1.24)
GFR calc Af Amer: >60 mL/min (ref >60)
GFR calc non Af Amer: >60 mL/min (ref >60)
Glucose, Bld: 95 mg/dL (ref 70–99)
Potassium: 4.1 mmol/L (ref 3.5–5.1)
Sodium: 132 mmol/L — ABNORMAL LOW (ref 135–145)

## 2019-07-14 NOTE — Progress Notes (Signed)
Physical Therapy Session Note  Patient Details  Name: Jorge Weaver. MRN: [id number] Date of Birth: [birth year]  Today's Date: 07/14/2019 PT Individual Time: 8527-7824; 2353-6144 PT Individual Time Calculation (min): 45 min and 70 min PT Missed Time: 15 min Missed Time Reason: patient fatigue  Short Term Goals: Week 1:  PT Short Term Goal 1 (Week 1): Pt will complete least restrictive transfer with mod A PT Short Term Goal 2 (Week 1): Pt will initiate gait training as safe and able PT Short Term Goal 3 (Week 1): Pt will tolerate sitting up out of bed x 1 hour  Skilled Therapeutic Interventions/Progress Updates:    Session 1: Pt received supine in bed asleep, arousable and agreeable to PT session. Pt remains lethargic throughout session, falling asleep frequently while in supine. Supine BP 119/71. Assisted pt with donning thigh-high TED hose and BLE ACE wraps as well as pants. Pt is dependent for TEDs and ACE wraps, max A for pants. Supine to sit with min A with HOB elevated and use of bedrails. Pt reports feeling "woozy" in sitting. Seated BP 118/91. Pt is max A to don shoes while seated EOB. Pt requires min A for sitting balance falling anteriorly and to the L, able to correct and maintain with min A. Sit to stand with mod A to stedy. Stedy transfer to recliner. Pt has not yet eaten breakfast, agreeable to attempt to eat while seated in chair. Pt left seated in recliner in room with needs in reach, BLE elevated, quick release belt and chair alarm in place, set up with breakfast. Pt missed 15 min of scheduled therapy session due to ongoing fatigue and lethargy.  Session 2: Pt received supine in bed, ongoing lethargy but improved from AM session. No complaints of pain. Supine to sit with min A with HOB elevated. Sit to stand with mod A to stedy. Stedy transfer to w/c. Dependent transport via w/c to/from therapy gym for time conservation. Session focus on standing in standing frame for BLE WBing  and use of core musculature to achieve upright posture. Pt is able to stand in standing frame 3 x 5 min with minimal assist from sling needed. Once in standing decreased assist from sling and pt able to maintain upright posture with use of mirror for visual feedback and multimodal cueing for upright trunk, hip extension, and decreased lateral lean to the L. Pt does fatigue quickly in standing and unable to maintain trunk extension with onset of fatigue. Standing mini-squats x 5 reps in standing frame with decreased assist from sling. Standing BP 129/102 and seated BP 117/96. Removed ACE wraps once pt returned to sitting due to elevated BP this PM. With use of thigh-high TED hose seated BP 105/74 and pt asymptomatic. Pt requests to return to bed at end of session. Stedy transfer back to bed, mod A to stand to stedy from w/c seat height. Sit to supine min A. Pt left seated in bed with needs in reach, bed alarm in place at end of session.  Therapy Documentation Precautions:  Precautions Precautions: Fall Precaution Comments: ataxia Restrictions Weight Bearing Restrictions: No    Therapy/Group: Individual Therapy   Peter Congo, PT, DPT  07/14/2019, 12:36 PM

## 2019-07-14 NOTE — Progress Notes (Signed)
[facility] PHYSICAL MEDICINE & REHABILITATION PROGRESS NOTE   Subjective/Complaints:  Pt reports nerve pain doing better- is tired- because he's sleeping during day but not at night.  Only slept "a little bit" this weekend.   ROS:   Pt denies SOB, abd pain, CP, N/V/C/D, and vision changes    Objective:   No results found. Recent Labs    07/11/19 1601 07/14/19 0657  WBC 7.3 4.9  HGB 11.0* 10.8*  HCT 33.0* 32.7*  PLT 436* 472*   Recent Labs    07/14/19 0657  NA 132*  K 4.1  CL 97*  CO2 28  GLUCOSE 95  BUN 9  CREATININE 0.65  CALCIUM 9.0    Intake/Output Summary (Last 24 hours) at 07/14/2019 0846 Last data filed at 07/14/2019 0802 Gross per 24 hour  Intake 320 ml  Output 576 ml  Net -256 ml     Physical Exam: Vital Signs Blood pressure (!) 137/105, pulse 81, temperature 98.5 F (36.9 C), resp. rate 18, height 5\' 11"  (1.803 m), weight 115.5 kg, SpO2 97 %.   General: sleeping in bed; wakes poorly to stimuli, NAD Psych: appropriate, sleepy Heart: RRR< no MR/G, no JVD Lungs: CTA B/L- no W/R/R- good air movement Abdomen: Soft, NT, ND, (+)BS  Extremities: No clubbing, cyanosis, or edema  Neurologic: R>L facial weakness; with dysarthria- no change , motor strength is 3-/5 in bilateral deltoid, bicep, tricep, grip, 2-hip flexor, knee extensors, ankle dorsiflexor and plantar flexor Sensory exam paresthesias to touch in the hands and feet. Cerebellar exam mild dysmetria  finger to nose to finger as well as heel to shin in bilateral upper and lower extremities Musculoskeletal: Full range of motion in all 4 extremities. No joint swelling   Assessment/Plan: 1. 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  Physiatrist is providing close team supervision and 24 hour management of active medical  problems listed below.  Physiatrist and rehab team continue to assess barriers to discharge/monitor patient progress toward functional and medical goals  Care Tool:  Bathing    Body parts bathed by patient: Right arm, Left arm, Chest, Abdomen, Front perineal area, Right upper leg, Left upper leg, Face   Body parts bathed by helper: Buttocks, Right lower leg, Left lower leg     Bathing assist Assist Level: Moderate Assistance - Patient 50 - 74%     Upper Body Dressing/Undressing Upper body dressing   What is the patient wearing?: Pull over shirt    Upper body assist Assist Level: Minimal Assistance - Patient > 75%    Lower Body Dressing/Undressing Lower body dressing      What is the patient wearing?: Pants, Incontinence brief     Lower body assist Assist for lower body dressing: Maximal Assistance - Patient 25 - 49%     Toileting Toileting    Toileting assist Assist for toileting: Dependent - Patient 0%(using Stedy)     Transfers Chair/bed transfer  Transfers assist     Chair/bed transfer assist level: Minimal Assistance - Patient > 75%     Locomotion Ambulation   Ambulation assist   Ambulation activity did not occur: Safety/medical concerns  Assist level: 2 helpers Assistive device: Lite Gait Max distance: 4'   Walk 10 feet activity   Assist  Walk 10 feet activity did not occur: Safety/medical concerns        Walk  50 feet activity   Assist Walk 50 feet with 2 turns activity did not occur: Safety/medical concerns         Walk 150 feet activity   Assist Walk 150 feet activity did not occur: Safety/medical concerns         Walk 10 feet on uneven surface  activity   Assist Walk 10 feet on uneven surfaces activity did not occur: Safety/medical concerns         Wheelchair     Assist Will patient use wheelchair at discharge?: Yes Type of Wheelchair: Manual    Wheelchair assist level: Supervision/Verbal cueing Max  wheelchair distance: 75    Wheelchair 50 feet with 2 turns activity    Assist        Assist Level: Supervision/Verbal cueing   Wheelchair 150 feet activity     Assist      Assist Level: Supervision/Verbal cueing   Blood pressure (!) 137/105, pulse 81, temperature 98.5 F (36.9 C), resp. rate 18, height 5\' 11"  (1.803 m), weight 115.5 kg, SpO2 97 %.  Medical Problem List and Plan: 1.  Impaired mobility and ADLs secondary to Guillain Barre Syndrome with miller Fischer Variant.              -patient may shower              -ELOS/Goals: 14-19 days; continue CIR PT OT speech Wife asked if son could visit x 1 , son will not be a caregiver therefore not meeting criteria for exception to visitation rules 2.  Antithrombotics: -DVT/anticoagulation:  Pharmaceutical: Heparin  4/20- changed to lovenox- renal function OK             -antiplatelet therapy: N/a 3. Pain Management: Tylenol prn.  4/20- look below for changes to pain regimen  Neurogenic pain in hands improved with increase lyrica to 100mg  at noc 4. Mood: LCSW to follow for evaluation and support.              -antipsychotic agents: N/a 5. Neuropsych: This patient is capable of making decisions on his own behalf. 6. Skin/Wound Care: Routine pressure relief measures.  7. Fluids/Electrolytes/Nutrition: Monitor I/O. Check lytes in am--off fluid restriction now 8. HTN: Monitor BP tid--continue Hydralazine, metoprolol and amlodipine. Has been labile.     Was called about low blood pressure in physical therapy, systolic was in the 80s.  Patient felt dizzy.  He felt better once he was placed into the bed and is blood pressures came up in the 1 10-1 20 range.  Will discontinue amlodipine  4/26- BP better with Norvasc off.  Vitals:   07/14/19 0344 07/14/19 0554  BP: (!) 134/103 (!) 137/105  Pulse: 81   Resp: 18   Temp: 98.5 F (36.9 C)   SpO2: 97%     9. Hyponatremia: Likely due to GBS. Has received Tolvaptan, fluid  restriction, and salt tablets. Slowly trending upward. 129 on 4/19.     4/22- Na up to 134- doing better- con't regimen  4/26- Na 132- stable  10. H/o alcohol use: Continue Thiamine and Folic acid. boderline low Mg 1.7-1.8 range. Will add supplement.  4/22- had pt see Neuropsychology.   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  12. Abnormal LFTs: Recheck in am.  13. Bladder incontinence/neurogenic bladder:   4/20- will try and get external catheter/purewick because pt has cut/abrasion on penis from condom catheter- will also check PVRs to make sure emptying 14. Dysarthria:  Severe this am with increase in right facial weakness. Discussed with Dr. Thedore Mins and Felicie Morn PA for neuro.  Benadryl recommended to help with edema. They report that speech does fluctuate on and off. NIF ordered X 2 per and neuro recommends formal consult in am if speech not improved.   NIF and vital capacity have been normal and stable.  Will discontinue 15. Insomnia: Continue Ambien prn  16. 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 Acute low back pain and N/T due to GBS-  4/20- will add lidoderm patches-2 12 hrs on; 12 hrs off; tramadol 50 mg q6 hours prn; and change gabapentin to lyrica 75 mg TID  4/21- a little sleepy- could be lyrica, but pain much better controlled- per pt- will monitor- might need to decrease lyrica.   4/22- will decrease Lyrica to 75 mg QHS due to daytime sedation.   4/23- will add lidocaine ointment 5% QID to hands and add Duloxetine 30 mg QHS  4/26- Lyrica was increased to 100 mg QHS this weekend- helpful- con't meds   LOS: 7 days A FACE TO FACE EVALUATION WAS PERFORMED  Lacora Folmer 07/14/2019, 8:46 AM

## 2019-07-14 NOTE — Progress Notes (Signed)
Occupational Therapy Session Note  Patient Details  Name: Jorge Bahner Jr. MRN: [id number] Date of Birth: 11/06/1965  Today's Date: 07/14/2019 OT Individual Time: 1104-1201 OT Individual Time Calculation (min): 57 min    Short Term Goals: Week 1:  OT Short Term Goal 1 (Week 1): Pt will complete toilet transfer with mod A OT Short Term Goal 2 (Week 1): Pt will complete LB dressing with mod A OT Short Term Goal 3 (Week 1): Pt will complete sit<>stand at the sink with mod A in preparation for BADL: tasks  Skilled Therapeutic Interventions/Progress Updates:  Patient met seated in recliner in agreement with OT treatment session with focus on self-care re-education, sit to stands with use of Stedy, and BUE FMC as detailed below. Patient indicating need to void requiring assistance to manage clothing and position urinal. Patient notes continued numbness in BUE making self-feeding a hardship this A.M. Sit to stand with use of Stedy and Mod A from low recliner. Patient required Mod A to hike pants and brief over hips in standing. Wc transfer from Stedy with Mod A for stand to sit to control descent. Wc mobility from room to dayroom with 2 rest breaks secondary to fatigue. In dayroom, patient engaged in BUE FMC activities with use of thera-putty requiring 9:03 sec to remove 10 beans from pink medium resistance putty. Total A for wc transport back to room for time management. Sit to stand from wc to Stedy with Mod A and stand to sit at EOB with Mod A for controlled descent. Return to supine with Min A to assist BLE from EOB to bed surface. Session concluded with patient lying supine in bed with call bell within reach, bed a alarm activated, and all needs met.   Therapy Documentation Precautions:  Precautions Precautions: Fall Precaution Comments: ataxia Restrictions Weight Bearing Restrictions: No  Therapy/Group: Individual Therapy  Destanae R Howerton-Davis 07/14/2019, 11:22 AM 

## 2019-07-14 NOTE — Plan of Care (Signed)
  Problem: Consults Goal: RH SPINAL CORD INJURY PATIENT EDUCATION Description:  See Patient Education module for education specifics.  Outcome: Progressing Goal: Skin Care Protocol Initiated - if Braden Score 18 or less Description: If consults are not indicated, leave blank or document N/A Outcome: Progressing Goal: Nutrition Consult-if indicated Outcome: Progressing   Problem: SCI BOWEL ELIMINATION Goal: RH STG MANAGE BOWEL WITH ASSISTANCE Description: STG Manage Bowel with mod I Assistance. Outcome: Progressing Goal: RH STG MANAGE BOWEL W/EQUIPMENT W/ASSISTANCE Description: STG Manage Bowel With Equipment With Assistance Outcome: Progressing   Problem: SCI BLADDER ELIMINATION Goal: RH STG MANAGE BLADDER WITH ASSISTANCE Description: STG Manage Bladder With mod I Assistance Outcome: Progressing Goal: RH OTHER STG BLADDER ELIMINATION GOALS W/ASSIST Description: Other STG Bladder Elimination Goals With mod I Assistance Outcome: Progressing   Problem: RH SKIN INTEGRITY Goal: RH STG SKIN FREE OF INFECTION/BREAKDOWN Description: Patient will not acquire any skin break down while on IP rehab Outcome: Progressing Goal: RH STG MAINTAIN SKIN INTEGRITY WITH ASSISTANCE Description: STG Maintain Skin Integrity With mod I Assistance. Outcome: Progressing Goal: RH STG ABLE TO PERFORM INCISION/WOUND CARE W/ASSISTANCE Description: STG Able To Perform Incision/Wound Care With mod I Assistance. Outcome: Progressing   Problem: RH SAFETY Goal: RH STG ADHERE TO SAFETY PRECAUTIONS W/ASSISTANCE/DEVICE Description: STG Adhere to Safety Precautions With mod I Assistance/Device. Outcome: Progressing   Problem: RH PAIN MANAGEMENT Goal: RH STG PAIN MANAGED AT OR BELOW PT'S PAIN GOAL Description: Pain scale <4/10 Outcome: Progressing   Problem: RH KNOWLEDGE DEFICIT SCI Goal: RH STG INCREASE KNOWLEDGE OF SELF CARE AFTER SCI Description: Patient will be able to read back instructions on self care,  his condition & preventative measures by discharge Outcome: Progressing   

## 2019-07-15 ENCOUNTER — Inpatient Hospital Stay (HOSPITAL_COMMUNITY): Payer: BC Managed Care – PPO | Admitting: Physical Therapy

## 2019-07-15 ENCOUNTER — Inpatient Hospital Stay (HOSPITAL_COMMUNITY): Payer: BC Managed Care – PPO

## 2019-07-15 LAB — METHYLMALONIC ACID(MMA), RND URINE
Creatinine(Crt), U: 0.92 g/L (ref 0.30–3.00)
MMA - Normalized: 1.9 umol/mmol cr (ref 0.3–2.8)
Methylmalonic Acid, Ur: 15.2 umol/L (ref 1.6–29.7)

## 2019-07-15 MED ORDER — SORBITOL 70 % SOLN
30.0000 mL | Freq: Once | Status: AC
  Administered 2019-07-15: 30 mL via ORAL
  Filled 2019-07-15: qty 30

## 2019-07-15 NOTE — Progress Notes (Signed)
Occupational Therapy Session Note  Patient Details  Name: Jorge Weaver. MRN: [id number] Date of Birth: 12-Feb-1966  Today's Date: 07/15/2019 OT Individual Time: 0800-0909 OT Individual Time Calculation (min): 69 min    Short Term Goals: Week 1:  OT Short Term Goal 1 (Week 1): Pt will complete toilet transfer with mod A OT Short Term Goal 2 (Week 1): Pt will complete LB dressing with mod A OT Short Term Goal 3 (Week 1): Pt will complete sit<>stand at the sink with mod A in preparation for BADL: tasks  Skilled Therapeutic Interventions/Progress Updates:    Pt resting in bed upon arrival and agreeable to therapy.  Pt requested to take a shower this morning.  OT intervention with focus on BADL retraining, bed mobility, sitting balance, sit<>stand, standing balance, BUE functional use, activity tolerance, and safety awareness to increase independence with BADLs. See Care Tool for assist levels.  Bed mobility with supervision-HOB elevated and use of bed rails. Sitting balance with supervision although pt with flexed trunk and significant posterior pelvic tilt. Sit<>stand from EOB with Stedy at min A.  Pt able to maintain standing in [place] with min A while pulling pants over hips during dressing tasks.  BUE ataxia improved as well as B hand pain. Pt able to thread pants with min A. Pt c/o increased dizziness near end of session and thigh Ted hose donned. Pt able to pull over knees once started. Dizziness improved slightly (BP 116/91) and pt returned to supine with min A.  Pt able to reposition in bed using head board to pull on.  Pt remained in bed with all needs within reach and bed alarm activated.   Therapy Documentation Precautions:  Precautions Precautions: Fall Precaution Comments: ataxia Restrictions Weight Bearing Restrictions: No Pain: Pain Assessment Pain Scale: 0-10 Pain Score: 0-No pain   Therapy/Group: Individual Therapy  [name] 07/15/2019, 9:09 AM

## 2019-07-15 NOTE — Progress Notes (Signed)
Physical Therapy Weekly Progress Note  Patient Details  Name: Jorge Weaver. MRN: [id number] Date of Birth: Feb 13, 1966  Beginning of progress report period: July 08, 2019 End of progress report period: July 15, 2019  Today's Date: 07/15/2019 PT Individual Time: 1000-1100; 1420-1520 PT Individual Time Calculation (min): 60 min and 60 min PT Missed Time: 15 min Missed Time Reason: toileting  Patient has met 2 of 3 short term goals.  Pt is making slow but steady progress towards LTG. Pt was limited over the past week by hypotension and lethargy which limited his ability to participate functionally, however this has improved over the past few days. Pt is overall at min A level for bed mobility, mod A for transfers via squat pivot, Supervision level for w/c mobility, and max to total A for standing via use of stedy, standing frame, or LiteGait. Pt has not exhibited sufficient trunk control or LE strength to initiate gait training but is making progress towards this.  Patient continues to demonstrate the following deficits muscle weakness, decreased cardiorespiratoy endurance, abnormal tone, unbalanced muscle activation, ataxia and decreased coordination and decreased sitting balance, decreased standing balance, decreased postural control and decreased balance strategies and therefore will continue to benefit from skilled PT intervention to increase functional independence with mobility.  Patient progressing toward long term goals..  Continue plan of care.  PT Short Term Goals Week 1:  PT Short Term Goal 1 (Week 1): Pt will complete least restrictive transfer with mod A PT Short Term Goal 1 - Progress (Week 1): Met PT Short Term Goal 2 (Week 1): Pt will initiate gait training as safe and able PT Short Term Goal 2 - Progress (Week 1): Progressing toward goal PT Short Term Goal 3 (Week 1): Pt will tolerate sitting up out of bed x 1 hour PT Short Term Goal 3 - Progress (Week 1): Met Week 2:  PT  Short Term Goal 1 (Week 2): Pt will perform least restrictive transfer with min A consistently PT Short Term Goal 2 (Week 2): Pt will initiate gait training as safe and able PT Short Term Goal 3 (Week 2): Pt will maintain dynamic sitting balance with min A  Skilled Therapeutic Interventions/Progress Updates:    Session 1: Pt received supine in bed, agreeable to PT session. Pt does appear lethargic this AM but improved from previous date. No complaints of pain at rest, does have onset of "fire" like pain in B hands during session. Pain is not rated and pt requests pain-relief cream from RN to address pain. Supine to sit with min A with HOB elevated. Pt wearing thigh-high TED hose, no symptoms of orthostasis this session. Squat pivot transfer bed to w/c with mod A. Pt is max A to don w/c gloves with increased time needed. Manual w/c propulsion 2 x 100 ft with use of BUE at Supervision level. Pt fatigues quickly with w/c mobility, requires frequent rest breaks. Remainder of session focus on core strengthening and sitting balance. Squat pivot transfer w/c to/from mat table with mod A. Seated crunches from red therapy wedge 2 x 10 reps initially with min A increasing to mod A required to return to full upright position. Seated balance with therapy wedge under L hip due to tendency to lean laterally to the L in sitting. Pt exhibits improved positioning with use of wedge. While seated on wedge pt engages in BUE ball punch-outs and OH lift with volleyball, close SBA to CGA for sitting balance, 2 x 10 reps  each. Pt requests to return to bed at end of session. Squat pivot transfer mod A. Sit to supine min A. Pt left semi-reclined in bed with needs in reach, bed alarm in place at end of session.  Session 2: Pt received seated in bed, agreeable to PT session. No complaints of pain. Pt's daughter at bedside, discussed home setup. Pt has a steep driveway and then once entering the home has 9 stairs to go up to  bedroom/bathroom or 9 stairs to go down to basement area due to home being a split level. Discussed pt's current functional level and that stairs are going to be difficult for the patient at this point but will continue to work on standing balance and gait initiation. Pt is at min A level for bed mobility. Squat pivot transfer with mod A throughout session, cues for safety and sequencing. Set pt up with LiteGait harness in order to work on standing. Pt is able to stand via use of LiteGait with BUE support on LiteGait handles. Once in standing pt struggles to maintain hip and trunk extension. Use of mirror and multimodal cueing for feedback. Pt is able to perform standing mini-marches, decreased L knee control in stance. Pt also exhibits ataxic limbs in standing with poor control of them when unweighted. Pt is able to perform 3 stands in LiteGait and tolerate standing for about 2 min at most. Pt then reports urge to have a BM. Assisted pt back to his room. Stedy transfer to toilet, dependent for clothing management. Pt left seated on toilet in room with call button in reach, RN notified. Pt missed 15 min of scheduled therapy session for toileting.  Therapy Documentation Precautions:  Precautions Precautions: Fall Precaution Comments: ataxia Restrictions Weight Bearing Restrictions: No   Therapy/Group: Individual Therapy   Excell Seltzer, PT, DPT  07/15/2019, 12:28 PM

## 2019-07-15 NOTE — Patient Care Conference (Signed)
Inpatient RehabilitationTeam Conference and Plan of Care Update Date: 07/15/2019   Time: 11:00 AM    Patient Name: Jorge Weaver.      Medical Record Number: [id number]  Date of Birth: February 21, 1966 Sex: Male         Room/Bed: 4W01C/4W01C-01 Payor Info: Payor: [place] / Plan: [employer] COMM PPO / Product Type: *No Product type* /    Admit Date/Time:  07/07/2019  5:13 PM  Primary Diagnosis:  GBS (Guillain-Barre syndrome) ([place])  Patient Active Problem List   Diagnosis Date Noted  . GBS (Guillain-Barre syndrome) ([facility]) 07/07/2019  . Hypokalemia 06/28/2019  . Weakness 06/27/2019  . Cervical spondylosis with myelopathy and radiculopathy 01/16/2019  . Spinal stenosis of lumbar region 12/24/2018  . Gastroesophageal reflux disease 06/26/2018  . Essential hypertension 06/17/2018  . Fatty liver   . Witnessed apneic spells 03/28/2017  . Prediabetes 01/18/2017  . Bronchitis 05/21/2014  . ETOH abuse 05/21/2014  . Asthma 01/15/2013  . Asthma with exacerbation 01/15/2013  . Generalized anxiety disorder 08/24/2012  . Malaise and fatigue 08/24/2012    Expected Discharge Date: Expected Discharge Date: 07/29/19  Team Members Present: Physician leading conference: Dr. Courtney Heys Care Coodinator Present: Loralee Pacas, LCSWA;Christina Sampson Goon, BSW;Genie Wilba Mutz, RN, MSN Nurse Present: Rayne Du, LPN PT Present: Excell Seltzer, PT OT Present: Willeen Cass, OT;Roanna Epley, COTA PPS Coordinator present : Ileana Ladd, Burna [place], SLP     Current Status/Progress Goal Weekly Team Focus  Bowel/Bladder   Pt is continent of B/B  Remain continent of bowel/bladder  Assess the need for bathroom use q2   Swallow/Nutrition/ Hydration             ADL's   Bathing-mod A at shower level; UB dressing-min A; LB dressing-max A; functional transfers-mod/max A; toileting, max A  Supervision  transfers, activity tolerance, sit<>stand, UB strength/coordination   Mobility   min A  bed mobility, mod to max A transfers, min A w/c mobility x 100'  S to min A overall  OOB tolerance, UE and LE NMR, standing as tolerated   Communication             Safety/Cognition/ Behavioral Observations            Pain   Pt complains of lower back pain, 7/10. Got scheduled tylenol and lidocaine 5%  Pain level to remain below 2.  Assess pain q shift and as needed.   Skin   Skin is intact  To prevent any skin breakdown  Assess skin q shift and as needed.    Rehab Goals Patient on target to meet rehab goals: Yes Rehab Goals Revised: Patient on target with goals *See Care Plan and progress notes for long and short-term goals.     Barriers to Discharge  Current Status/Progress Possible Resolutions Date Resolved   Nursing                  PT  Decreased caregiver support;Medical stability;Home environment access/layout;Incontinence                 OT                  SLP                SW Inaccessible home environment;Home environment access/layout Spouse concerned about entrance of home. Spouse feels entrance is steep, patient has fell here a couple times in past Patient on target with goals, will determine d/c at team conference  Discharge Planning/Teaching Needs:  Goal to discharge home with spouse  Will schedule education if needed   Team Discussion: MD monitoring BP, on lyrica, nerve pain better.  RN confused yesterday, not sleeping well, cont, pain better controlled.  PT min A bed, pivog mod A, S w/c mobility, stand mod/max A.  PT shower with steady for transfers, min A bathing, LB dressing mod/max A.  Has a wife.   Revisions to Treatment Plan: N/A     Medical Summary Current Status: didn't sleep yesterday- confused yesterday- not today; continent- frequency is better- no skin issues- like lidocaine ointment Weekly Focus/Goal: PT- lethargic- better - BP better; no ACE wraps- min assist bed; squat pivot mod A; supervison w/c; stand mod-max A; no gait so far   Barriers to Discharge: Decreased family/caregiver support;Behavior;Other (comments);Medical stability;Home enviroment access/layout;Incontinence  Barriers to Discharge Comments: sedation, low BP- - d/c 5/11 Possible Resolutions to Barriers: OT- hand pain better; has done shower- steady for transfer; ataxia somewhat better; mod-max LB; to start therapy at 9am starting tomorrow   Continued Need for Acute Rehabilitation Level of Care: The patient requires daily medical management by a physician with specialized training in physical medicine and rehabilitation for the following reasons: Direction of a multidisciplinary physical rehabilitation program to maximize functional independence : Yes Medical management of patient stability for increased activity during participation in an intensive rehabilitation regime.: Yes Analysis of laboratory values and/or radiology reports with any subsequent need for medication adjustment and/or medical intervention. : Yes   I attest that I was present, lead the team conference, and concur with the assessment and plan of the team.   Trish Mage 07/15/2019, 1:49 PM   Team conference was held via web/ teleconference due to COVID - 19

## 2019-07-15 NOTE — Progress Notes (Signed)
[place] PHYSICAL MEDICINE & REHABILITATION PROGRESS NOTE   Subjective/Complaints:  Pt reports nerve pain is "getting better" not gone yet, but better and "going away".   Admits likes to sleep in late in the morning- has been an issue with therapy- will see if can move therapy to later.   LBM Saturday per pt.  Mouth feels numb still-    ROS:  Pt denies SOB, abd pain, CP, N/V/C/D, and vision changes   Objective:   No results found. Recent Labs    07/14/19 0657  WBC 4.9  HGB 10.8*  HCT 32.7*  PLT 472*   Recent Labs    07/14/19 0657  NA 132*  K 4.1  CL 97*  CO2 28  GLUCOSE 95  BUN 9  CREATININE 0.65  CALCIUM 9.0    Intake/Output Summary (Last 24 hours) at 07/15/2019 0906 Last data filed at 07/15/2019 0806 Gross per 24 hour  Intake 440 ml  Output 668 ml  Net -228 ml     Physical Exam: Vital Signs Blood pressure (!) 116/94, pulse 77, temperature 98.8 F (37.1 C), resp. rate 18, height 5\' 11"  (1.803 m), weight 84.7 kg, SpO2 98 %.   General:sleeping in bed again- woke some to verbal stimuli, laying supine in bed, NAD Psych: appropriate, sleepy Heart: RRR- no JVD Lungs: CTA B/L- no W/R/R- good air movement Abdomen: Soft, NT, somewhat distended, (+)BS;  Extremities: No clubbing, cyanosis, or edema  Neurologic: R>L facial weakness with facial sensation decreased R>L esp around mouth; with dysarthria- no change , motor strength is 3-/5 in bilateral deltoid, bicep, tricep, grip, 2-hip flexor, knee extensors, ankle dorsiflexor and plantar flexor Sensory exam paresthesias to touch in the hands and feet. Cerebellar exam mild dysmetria  finger to nose to finger as well as heel to shin in bilateral upper and lower extremities Musculoskeletal: Full range of motion in all 4 extremities. No joint swelling   Assessment/Plan: 1. 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  Physiatrist is providing close team supervision and 24 hour management of active medical problems listed below.  Physiatrist and rehab team continue to assess barriers to discharge/monitor patient progress toward functional and medical goals  Care Tool:  Bathing    Body parts bathed by patient: Right arm, Left arm, Chest, Abdomen, Front perineal area, Right upper leg, Left upper leg, Face   Body parts bathed by helper: Buttocks, Right lower leg, Left lower leg     Bathing assist Assist Level: Moderate Assistance - Patient 50 - 74%     Upper Body Dressing/Undressing Upper body dressing   What is the patient wearing?: Pull over shirt    Upper body assist Assist Level: Minimal Assistance - Patient > 75%    Lower Body Dressing/Undressing Lower body dressing      What is the patient wearing?: Pants, Incontinence brief     Lower body assist Assist for lower body dressing: Maximal Assistance - Patient 25 - 49%     Toileting Toileting    Toileting assist Assist for toileting: Dependent - Patient 0%(using Stedy)     Transfers Chair/bed transfer  Transfers assist     Chair/bed transfer assist level: Dependent - mechanical lift(stedy)     Locomotion Ambulation   Ambulation assist   Ambulation activity did not occur: Safety/medical concerns  Assist level: 2 helpers Assistive device: Lite Gait Max  distance: 4'   Walk 10 feet activity   Assist  Walk 10 feet activity did not occur: Safety/medical concerns        Walk 50 feet activity   Assist Walk 50 feet with 2 turns activity did not occur: Safety/medical concerns         Walk 150 feet activity   Assist Walk 150 feet activity did not occur: Safety/medical concerns         Walk 10 feet on uneven surface  activity   Assist Walk 10 feet on uneven surfaces activity did not occur: Safety/medical concerns          Wheelchair     Assist Will patient use wheelchair at discharge?: Yes Type of Wheelchair: Manual    Wheelchair assist level: Supervision/Verbal cueing Max wheelchair distance: 75    Wheelchair 50 feet with 2 turns activity    Assist        Assist Level: Supervision/Verbal cueing   Wheelchair 150 feet activity     Assist      Assist Level: Supervision/Verbal cueing   Blood pressure (!) 116/94, pulse 77, temperature 98.8 F (37.1 C), resp. rate 18, height 5\' 11"  (1.803 m), weight 84.7 kg, SpO2 98 %.  Medical Problem List and Plan: 1.  Impaired mobility and ADLs secondary to Guillain Barre Syndrome with miller Fischer Variant.              -patient may shower             -ELOS/Goals: 14-19 days; continue CIR PT OT speech Wife asked if son could visit x 1 , son will not be a caregiver therefore not meeting criteria for exception to visitation rules 2.  Antithrombotics: -DVT/anticoagulation:  Pharmaceutical: Heparin  4/20- changed to lovenox- renal function OK             -antiplatelet therapy: N/a 3. Pain Management: Tylenol prn.  4/20- look below for changes to pain regimen  Neurogenic pain in hands improved with increase lyrica to 100mg  at noc  4/27- nerve pain improving on current meds 4. Mood: LCSW to follow for evaluation and support.              -antipsychotic agents: N/a 5. Neuropsych: This patient is capable of making decisions on his own behalf. 6. Skin/Wound Care: Routine pressure relief measures.  7. Fluids/Electrolytes/Nutrition: Monitor I/O. Check lytes in am--off fluid restriction now 8. HTN: Monitor BP tid--continue Hydralazine, metoprolol and amlodipine. Has been labile.     Was called about low blood pressure in physical therapy, systolic was in the 38B.  Patient felt dizzy.  He felt better once he was placed into the bed and is blood pressures came up in the 1 10-1 20 range.  Will discontinue amlodipine  4/26- BP better with Norvasc off.   Vitals:   07/14/19 1912 07/15/19 0528  BP: (!) 119/92 (!) 116/94  Pulse: 89 77  Resp: 18 18  Temp: 98 F (36.7 C) 98.8 F (37.1 C)  SpO2: 97% 98%    9. Hyponatremia: Likely due to GBS. Has received Tolvaptan, fluid restriction, and salt tablets. Slowly trending upward. 129 on 4/19.   4/22- Na up to 134- doing better- con't regimen  4/26- Na 132- stable  10. H/o alcohol use: Continue Thiamine and Folic acid. boderline low Mg 1.7-1.8 range. Will add supplement.  4/22- had pt see Neuropsychology.   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  12. Abnormal  LFTs: Recheck in am.  13. Bladder incontinence/neurogenic bladder:   4/20- will try and get external catheter/purewick because pt has cut/abrasion on penis from condom catheter- will also check PVRs to make sure emptying 14. Dysarthria: Severe this am with increase in right facial weakness. Discussed with Dr. Thedore Mins and Felicie Morn PA for neuro.  Benadryl recommended to help with edema. They report that speech does fluctuate on and off. NIF ordered X 2 per and neuro recommends formal consult in am if speech not improved.   NIF and vital capacity have been normal and stable.  Will discontinue 15. Insomnia: Continue Ambien prn  16. 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 Acute low back pain and N/T due to GBS-  4/20- will add lidoderm patches-2 12 hrs on; 12 hrs off; tramadol 50 mg q6 hours prn; and change gabapentin to lyrica 75 mg TID  4/21- a little sleepy- could be lyrica, but pain much better controlled- per pt- will monitor- might need to decrease lyrica.   4/22- will decrease Lyrica to 75 mg QHS due to daytime sedation.   4/23- will add lidocaine ointment 5% QID to hands and add Duloxetine 30 mg QHS  4/26- Lyrica was increased to 100 mg QHS this  weekend- helpful- con't meds   LOS: 8 days A FACE TO FACE EVALUATION WAS PERFORMED  Jorge Weaver 07/15/2019, 9:06 AM

## 2019-07-15 NOTE — Progress Notes (Signed)
Social Work Patient ID: Jorge Weaver., male   DOB: Sep 13, 1965, 54 y.o.   MRN: [id number]  Sw entered room, daughter at bedside. Sw provided team conference update to patient and daughter in room. Provided updates from MD, nursing and therapy. Informed patient and daughter of tentative discharge date of May 11th. Sw also provided this update to spouse via telephone. Also confirmed home set up with daughter and spouse. Patients driveway is located on steep hill. Patient has 1 step/threshold to enter home. To access bedroom patient has 8-9 steps. Patient and family agree with discharge date and is on target to meet goals.

## 2019-07-15 NOTE — Progress Notes (Signed)
Occupational Therapy Weekly Progress Note  Patient Details  Name: Jorge Weaver. MRN: [id number] Date of Birth: 05-24-1965  Beginning of progress report period: July 08, 2019 End of progress report period: July 15, 2019  Patient has met 1 of 3 short term goals. Pt progress has been slow this past week.  Pt initially very lethargic with increased B hand pain inhibiting consistent active participation in therapy.  Pt with increased ability to participate actively in therapy over the past two days.  Pt able to complete bathing tasks at shower level with min A using the Stedy for bathing buttocks. Sitting balance with close supervision. Pt with significant posterior pelvic tilet and flexed trunk. Sit<>stand with Stedy to pull pants over hips with min/mod A for sit<>stand but pt able to maintain standing balance with min A while pulling pants over hips. Squat pivot tranfsers with mod A.  Pt currently requires use of Stedy for sit<>stand.  Patient continues to demonstrate the following deficits: muscle weakness, decreased cardiorespiratoy endurance, impaired timing and sequencing, unbalanced muscle activation, ataxia and decreased coordination and decreased sitting balance, decreased standing balance, decreased postural control and decreased balance strategies and therefore will continue to benefit from skilled OT intervention to enhance overall performance with BADL and Reduce care partner burden.  Patient progressing toward long term goals..  Continue plan of care.  OT Short Term Goals Week 1:  OT Short Term Goal 1 (Week 1): Pt will complete toilet transfer with mod A OT Short Term Goal 1 - Progress (Week 1): Progressing toward goal OT Short Term Goal 2 (Week 1): Pt will complete LB dressing with mod A OT Short Term Goal 2 - Progress (Week 1): Met OT Short Term Goal 3 (Week 1): Pt will complete sit<>stand at the sink with mod A in preparation for BADL: tasks OT Short Term Goal 3 - Progress (Week  1): Progressing toward goal Week 2:  OT Short Term Goal 1 (Week 2): Pt will complete toilet transfer with mod A OT Short Term Goal 2 (Week 2): Pt will complete sit<>stand at the sink with mod A in preparation for BADL: tasks OT Short Term Goal 3 (Week 2): Pt will complete LB dressing with min A OT Short Term Goal 4 (Week 2): Pt will complete toileting tasks with mod A   Leroy Libman 07/15/2019, 12:20 PM

## 2019-07-15 NOTE — Plan of Care (Signed)
  Problem: Consults Goal: RH SPINAL CORD INJURY PATIENT EDUCATION Description:  See Patient Education module for education specifics.  Outcome: Progressing Goal: Skin Care Protocol Initiated - if Braden Score 18 or less Description: If consults are not indicated, leave blank or document N/A Outcome: Progressing Goal: Nutrition Consult-if indicated Outcome: Progressing   Problem: SCI BOWEL ELIMINATION Goal: RH STG MANAGE BOWEL WITH ASSISTANCE Description: STG Manage Bowel with mod I Assistance. Outcome: Progressing Goal: RH STG MANAGE BOWEL W/EQUIPMENT W/ASSISTANCE Description: STG Manage Bowel With Equipment With Assistance Outcome: Progressing   Problem: SCI BLADDER ELIMINATION Goal: RH STG MANAGE BLADDER WITH ASSISTANCE Description: STG Manage Bladder With mod I Assistance Outcome: Progressing Goal: RH OTHER STG BLADDER ELIMINATION GOALS W/ASSIST Description: Other STG Bladder Elimination Goals With mod I Assistance Outcome: Progressing   Problem: RH SKIN INTEGRITY Goal: RH STG SKIN FREE OF INFECTION/BREAKDOWN Description: Patient will not acquire any skin break down while on IP rehab Outcome: Progressing Goal: RH STG MAINTAIN SKIN INTEGRITY WITH ASSISTANCE Description: STG Maintain Skin Integrity With mod I Assistance. Outcome: Progressing Goal: RH STG ABLE TO PERFORM INCISION/WOUND CARE W/ASSISTANCE Description: STG Able To Perform Incision/Wound Care With mod I Assistance. Outcome: Progressing   Problem: RH SAFETY Goal: RH STG ADHERE TO SAFETY PRECAUTIONS W/ASSISTANCE/DEVICE Description: STG Adhere to Safety Precautions With mod I Assistance/Device. Outcome: Progressing   Problem: RH PAIN MANAGEMENT Goal: RH STG PAIN MANAGED AT OR BELOW PT'S PAIN GOAL Description: Pain scale <4/10 Outcome: Progressing   Problem: RH KNOWLEDGE DEFICIT SCI Goal: RH STG INCREASE KNOWLEDGE OF SELF CARE AFTER SCI Description: Patient will be able to read back instructions on self care,  his condition & preventative measures by discharge Outcome: Progressing   

## 2019-07-16 ENCOUNTER — Inpatient Hospital Stay (HOSPITAL_COMMUNITY): Payer: BC Managed Care – PPO

## 2019-07-16 ENCOUNTER — Inpatient Hospital Stay (HOSPITAL_COMMUNITY): Payer: BC Managed Care – PPO | Admitting: Physical Therapy

## 2019-07-16 LAB — CULTURE, BLOOD (ROUTINE X 2)
Culture: NO GROWTH
Culture: NO GROWTH
Special Requests: ADEQUATE
Special Requests: ADEQUATE

## 2019-07-16 NOTE — Progress Notes (Signed)
Occupational Therapy Session Note  Patient Details  Name: Jorge Weaver. MRN: [id number] Date of Birth: Mar 05, 1966  Today's Date: 07/16/2019 OT Individual Time: 0100-0200 OT Individual Time Calculation (min): 60 min    Short Term Goals: Week 2:  OT Short Term Goal 1 (Week 2): Pt will complete toilet transfer with mod A OT Short Term Goal 2 (Week 2): Pt will complete sit<>stand at the sink with mod A in preparation for BADL: tasks OT Short Term Goal 3 (Week 2): Pt will complete LB dressing with min A OT Short Term Goal 4 (Week 2): Pt will complete toileting tasks with mod A  Skilled Therapeutic Interventions/Progress Updates:  Pt received asleep in bed upon OTA arrival easily able to arouse and agreeable to EOB session as pt reports general malaise. Pt required MIN A for bed mobility to elevate trunk into sitting and initially reports dizziness. BP checked in sitting (126/35.) Session focus on BUE/ core therex and IADL tasks. Pt completed x10 reps with 1 lb dowel rod  of trunk rotation, shoulder flexion/ extension, chest press and bicep flexion/ extension. Pt complete seated IADL tasks related to medication mgmt where pt instructed to distrubute a weeks worth of pills into pill box. Pt required repetition of instructions to complete task accurately and increased time to manipulate pill bottles d/t decreased FMC. Pt additionally completed bil FMC therex utilizing red theraputty; exercises included composite digit flexion/extension, pincer grasp and wrist pronation/ supination. Pt report difficulty with self feeding during session; issued pt red built up foam for utensils to use during self feeding. Pt left in supine with bed alarm activated and all needs within reach.   Therapy Documentation Precautions:  Precautions Precautions: Fall Precaution Comments: ataxia Restrictions Weight Bearing Restrictions: No General:   Vital Signs: Therapy Vitals Temp: 98.4 F (36.9 C) Temp Source:  Oral Pulse Rate: 86 Resp: 19 BP: (!) 126/35 Patient Position (if appropriate): Sitting Oxygen Therapy SpO2: 98 % O2 Device: Room Air Pain: Pt reports unrated pain in Bil shoulders; decreased ROM during therex and provided manual massage as pain mgmt strategy.   Therapy/Group: Individual Therapy  Angelina Pih 07/16/2019, 3:34 PM

## 2019-07-16 NOTE — Progress Notes (Signed)
Occupational Therapy Session Note  Patient Details  Name: Jorge Weaver. MRN: [id number] Date of Birth: 28-Aug-1965  Today's Date: 07/16/2019 OT Individual Time: 0915-1010 OT Individual Time Calculation (min): 55 min    Short Term Goals: Week 2:  OT Short Term Goal 1 (Week 2): Pt will complete toilet transfer with mod A OT Short Term Goal 2 (Week 2): Pt will complete sit<>stand at the sink with mod A in preparation for BADL: tasks OT Short Term Goal 3 (Week 2): Pt will complete LB dressing with min A OT Short Term Goal 4 (Week 2): Pt will complete toileting tasks with mod A  Skilled Therapeutic Interventions/Progress Updates:    Pt asleep in bed upon arrival but easily aroused with min multimodal cues. Pt states that his face feels swollen and numb.  RN entered room and explained to pt that the "swollen" feeling is result of GBS. Pt verbalized understanding. Pt also stated that his hands were hurting more today that previously.  Pt agreeable to washing up sitting EOB.  Supine>sit EOB with supervision using bed rails and HOB elevated.  Sitting balance EOB with supervision and min verbal cues to correct posture. Pt sitting balance with forward and L bias. Pt completed UB bathing/dressing EOB with supervision.  Sit<>stand in [place] with min A to attempt bathing periarea and buttocks.  Pt unable to remain standing without UE support and returned to EOB.  Pt also c/o being lightheaded.  BP 126/91. Pt threaded BLE into pants with min A and stood in [place] to pull over hips.  Pt unable to assist with pulling over hips.  Pt again c/o dizziness. No nystagmus noted. Pt slightly diaphoretic and returned to supine. Sit>supine with supervisoin.  Pt able to reposition in bed without assistance. Pt remained in bed with bed alarm activated and all needs within reach.   Therapy Documentation Precautions:  Precautions Precautions: Fall Precaution Comments: ataxia Restrictions Weight Bearing Restrictions:  No Pain: Pt c/o B hand pain (5/10), numbness and "freezing"; emotional support, RN aware  Therapy/Group: Individual Therapy  [name] 07/16/2019, 10:12 AM

## 2019-07-16 NOTE — Progress Notes (Signed)
[place] PHYSICAL MEDICINE & REHABILITATION PROGRESS NOTE   Subjective/Complaints:  Pt reports likes lidocaine patches and ointment- helpful for nerve pain. Had BM in last 24 hours- feeling better- still sleepy.    ROS:  Pt denies SOB, abd pain, CP, N/V/C/D, and vision changes   Objective:   No results found. Recent Labs    07/14/19 0657  WBC 4.9  HGB 10.8*  HCT 32.7*  PLT 472*   Recent Labs    07/14/19 0657  NA 132*  K 4.1  CL 97*  CO2 28  GLUCOSE 95  BUN 9  CREATININE 0.65  CALCIUM 9.0    Intake/Output Summary (Last 24 hours) at 07/16/2019 1454 Last data filed at 07/16/2019 1409 Gross per 24 hour  Intake 220 ml  Output 525 ml  Net -305 ml     Physical Exam: Vital Signs Blood pressure (!) 126/35, pulse 86, temperature 98.4 F (36.9 C), temperature source Oral, resp. rate 19, height 5\' 11"  (1.803 m), weight 92 kg, SpO2 98 %.   General:sleepy- but woke to stimuli; appropriate, NAD Psych: appropriate, sleepy, cordial Heart:RRR Lungs: CTA B/L- no W/R/R- good air movement Abdomen: Soft, NT, ND, (+)BS   Extremities: No clubbing, cyanosis, or edema  Neurologic: R>L facial weakness with facial sensation decreased R>L esp around mouth; with dysarthria- no change today , motor strength is 3-/5 in bilateral deltoid, bicep, tricep, grip, 2-hip flexor, knee extensors, ankle dorsiflexor and plantar flexor Sensory exam paresthesias to touch in the hands and feet. Cerebellar exam mild dysmetria  finger to nose to finger as well as heel to shin in bilateral upper and lower extremities Musculoskeletal: Full range of motion in all 4 extremities. No joint swelling   Assessment/Plan: 1. 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  Physiatrist is providing close team supervision and 24 hour management  of active medical problems listed below.  Physiatrist and rehab team continue to assess barriers to discharge/monitor patient progress toward functional and medical goals  Care Tool:  Bathing    Body parts bathed by patient: Right arm, Left arm, Chest, Abdomen, Front perineal area, Right upper leg, Left upper leg, Face   Body parts bathed by helper: Buttocks, Left lower leg, Right lower leg     Bathing assist Assist Level: Moderate Assistance - Patient 50 - 74%     Upper Body Dressing/Undressing Upper body dressing   What is the patient wearing?: Pull over shirt    Upper body assist Assist Level: Supervision/Verbal cueing    Lower Body Dressing/Undressing Lower body dressing      What is the patient wearing?: Pants     Lower body assist Assist for lower body dressing: Moderate Assistance - Patient 50 - 74%     Toileting Toileting    Toileting assist Assist for toileting: Dependent - Patient 0%(using Stedy)     Transfers Chair/bed transfer  Transfers assist     Chair/bed transfer assist level: Moderate Assistance - Patient 50 - 74%(squat pivot)     Locomotion Ambulation   Ambulation assist   Ambulation activity did not occur: Safety/medical concerns  Assist level: 2 helpers Assistive device: Lite Gait Max distance: 4'   Walk 10 feet activity   Assist  Walk 10 feet activity did not occur: Safety/medical concerns        Walk 50 feet activity   Assist Walk 50  feet with 2 turns activity did not occur: Safety/medical concerns         Walk 150 feet activity   Assist Walk 150 feet activity did not occur: Safety/medical concerns         Walk 10 feet on uneven surface  activity   Assist Walk 10 feet on uneven surfaces activity did not occur: Safety/medical concerns         Wheelchair     Assist Will patient use wheelchair at discharge?: Yes Type of Wheelchair: Manual    Wheelchair assist level: Supervision/Verbal  cueing Max wheelchair distance: 100'    Wheelchair 50 feet with 2 turns activity    Assist        Assist Level: Supervision/Verbal cueing   Wheelchair 150 feet activity     Assist      Assist Level: Supervision/Verbal cueing   Blood pressure (!) 126/35, pulse 86, temperature 98.4 F (36.9 C), temperature source Oral, resp. rate 19, height 5\' 11"  (1.803 m), weight 92 kg, SpO2 98 %.  Medical Problem List and Plan: 1.  Impaired mobility and ADLs secondary to Guillain Barre Syndrome with miller Fischer Variant.              -patient may shower             -ELOS/Goals: 14-19 days; continue CIR PT OT speech Wife asked if son could visit x 1 , son will not be a caregiver therefore not meeting criteria for exception to visitation rules 2.  Antithrombotics: -DVT/anticoagulation:  Pharmaceutical: Heparin  4/20- changed to lovenox- renal function OK             -antiplatelet therapy: N/a 3. Pain Management: Tylenol prn.  4/20- look below for changes to pain regimen  Neurogenic pain in hands improved with increase lyrica to 100mg  at noc  4/27- nerve pain improving on current meds  4/28- pt likes lidocaine patches and ointment 4. Mood: LCSW to follow for evaluation and support.              -antipsychotic agents: N/a 5. Neuropsych: This patient is capable of making decisions on his own behalf. 6. Skin/Wound Care: Routine pressure relief measures.  7. Fluids/Electrolytes/Nutrition: Monitor I/O. Check lytes in am--off fluid restriction now 8. HTN: Monitor BP tid--continue Hydralazine, metoprolol and amlodipine. Has been labile.     Was called about low blood pressure in physical therapy, systolic was in the 80s.  Patient felt dizzy.  He felt better once he was placed into the bed and is blood pressures came up in the 1 10-1 20 range.  Will discontinue amlodipine  4/26- BP better with Norvasc off.  Vitals:   07/16/19 0315 07/16/19 1320  BP: (!) 138/98 (!) 126/35  Pulse: 74 86   Resp: 18 19  Temp: 97.6 F (36.4 C) 98.4 F (36.9 C)  SpO2: 98% 98%    9. Hyponatremia: Likely due to GBS. Has received Tolvaptan, fluid restriction, and salt tablets. Slowly trending upward. 129 on 4/19.   4/22- Na up to 134- doing better- con't regimen  4/26- Na 132- stable  10. H/o alcohol use: Continue Thiamine and Folic acid. boderline low Mg 1.7-1.8 range. Will add supplement.  4/22- had pt see Neuropsychology.   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  12. Abnormal LFTs: Recheck in am.  13. Bladder incontinence/neurogenic bladder:   4/20- will try and get external catheter/purewick because pt has cut/abrasion on penis from condom  catheter- will also check PVRs to make sure emptying 14. Dysarthria: Severe this am with increase in right facial weakness. Discussed with Dr. Thedore Mins and Felicie Morn PA for neuro.  Benadryl recommended to help with edema. They report that speech does fluctuate on and off. NIF ordered X 2 per and neuro recommends formal consult in am if speech not improved.   NIF and vital capacity have been normal and stable.  Will discontinue 15. Insomnia: Continue Ambien prn  16. 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 Acute low back pain and N/T due to GBS-  4/20- will add lidoderm patches-2 12 hrs on; 12 hrs off; tramadol 50 mg q6 hours prn; and change gabapentin to lyrica 75 mg TID  4/21- a little sleepy- could be lyrica, but pain much better controlled- per pt- will monitor- might need to decrease lyrica.   4/22- will decrease Lyrica to 75 mg QHS due to daytime sedation.   4/23- will add lidocaine ointment 5% QID to hands and add Duloxetine 30 mg QHS  4/26- Lyrica was increased to 100 mg QHS this weekend- helpful- con't  meds   LOS: 9 days A FACE TO FACE EVALUATION WAS PERFORMED  Leeona Mccardle 07/16/2019, 2:54 PM

## 2019-07-16 NOTE — Progress Notes (Signed)
Physical Therapy Session Note  Patient Details  Name: Jorge Weaver. MRN: [id number] Date of Birth: [birth year]  Today's Date: 07/16/2019 PT Individual Time: 8099-8338; 1445-1530 PT Individual Time Calculation (min): 40 min and 45 min   Short Term Goals: Week 2:  PT Short Term Goal 1 (Week 2): Pt will perform least restrictive transfer with min A consistently PT Short Term Goal 2 (Week 2): Pt will initiate gait training as safe and able PT Short Term Goal 3 (Week 2): Pt will maintain dynamic sitting balance with min A  Skilled Therapeutic Interventions/Progress Updates:    Session 1: Pt received supine in bed, reports he is "having another bad day" due to pain in B hands. Nursing present at beginning of therapy session and reports pt has received pain medication, able to apply Lidocaine patches to lower back, and apply numbing cream to hands. Pt agreeable to bed-level therapy this AM due to pain and fatigue. Reviewed supine BLE strengthening therex x 10 reps with AAROM to AROM: heel slides, hip abd, quad sets, SKFO, and bridges. Provided  Pt with handout for exercises so he can perform them independently in his room during downtime with assist from family. Pt left seated in bed with needs in reach, bed alarm in place at end of session.  Session 2: Pt received supine in bed asleep, arousable and agreeable to PT session. Pt reports ongoing burning pain in B hands that is being addressed via medication and numbing cream, not rated. Supine to sit with min A with HOB elevated. Squat pivot transfer to w/c with mod A. Dependent transport via w/c to/from therapy gym due to UE pain. Squat pivot transfer to/from Nustep with mod A. Nustep level 3, 2 x 5 min with use of BLE only for global endurance training, some assist to prevent L hip ER. Pt requires seated rest break following Nustep due to fatigue. Assisted pt back to bed at end of session. Sit to supine min A. Pt left semi-reclined in bed with needs in  reach, bed alarm in place at end of session.  Therapy Documentation Precautions:  Precautions Precautions: Fall Precaution Comments: ataxia Restrictions Weight Bearing Restrictions: No    Therapy/Group: Individual Therapy   Peter Congo, PT, DPT  07/16/2019, 3:33 PM

## 2019-07-17 ENCOUNTER — Inpatient Hospital Stay (HOSPITAL_COMMUNITY): Payer: BC Managed Care – PPO

## 2019-07-17 ENCOUNTER — Inpatient Hospital Stay (HOSPITAL_COMMUNITY): Payer: BC Managed Care – PPO | Admitting: Physical Therapy

## 2019-07-17 ENCOUNTER — Ambulatory Visit: Payer: BC Managed Care – PPO | Admitting: Allergy and Immunology

## 2019-07-17 NOTE — Progress Notes (Signed)
Occupational Therapy Session Note  Patient Details  Name: Jorge Weaver. MRN: [id number] Date of Birth: 12-Feb-1966  Today's Date: 07/17/2019 OT Individual Time: 8563-1497 OT Individual Time Calculation (min): 41 min    Short Term Goals: Week 1:  OT Short Term Goal 1 (Week 1): Pt will complete toilet transfer with mod A OT Short Term Goal 1 - Progress (Week 1): Progressing toward goal OT Short Term Goal 2 (Week 1): Pt will complete LB dressing with mod A OT Short Term Goal 2 - Progress (Week 1): Met OT Short Term Goal 3 (Week 1): Pt will complete sit<>stand at the sink with mod A in preparation for BADL: tasks OT Short Term Goal 3 - Progress (Week 1): Progressing toward goal  Skilled Therapeutic Interventions/Progress Updates:    1:1. Pt received in bed with pain reporting in L scap. Brief soft tissue mobs provided with some relief. Pt completes sit to stand in stedy to trasnfer EOB<>w/c<>EOM with VC for controlled decent into chair. Pt completes 2x5 sit ups from wedge pillow EOM with MIN-mod A to assume fully upright with fatigue. Pt BP checked and 142/107. Returned to room EOB and doffed teds. BP 135/96. RN alerted to high BP. Pt completes lateral leans in sitting EOB with and without feet support with S for trunk control. Exited session with pt seated in be,d exit alarm onand all need smet  Therapy Documentation Precautions:  Precautions Precautions: Fall Precaution Comments: ataxia Restrictions Weight Bearing Restrictions: No General:   Vital Signs:  Pain:   ADL: ADL Eating: Minimal assistance Grooming: Moderate assistance Upper Body Bathing: Moderate assistance Lower Body Bathing: Maximal assistance Upper Body Dressing: Moderate assistance Lower Body Dressing: Maximal assistance Toileting: Maximal assistance Toilet Transfer: Maximal verbal cueing Tub/Shower Transfer: Maximal assistance Vision   Perception    Praxis   Exercises:   Other Treatments:      Therapy/Group: Individual Therapy  Tonny Branch 07/17/2019, 1:43 PM

## 2019-07-17 NOTE — Plan of Care (Signed)
  Problem: Consults Goal: RH SPINAL CORD INJURY PATIENT EDUCATION Description:  See Patient Education module for education specifics.  Outcome: Progressing Goal: Skin Care Protocol Initiated - if Braden Score 18 or less Description: If consults are not indicated, leave blank or document N/A Outcome: Progressing Goal: Nutrition Consult-if indicated Outcome: Progressing   Problem: SCI BOWEL ELIMINATION Goal: RH STG MANAGE BOWEL WITH ASSISTANCE Description: STG Manage Bowel with mod I Assistance. Outcome: Progressing Goal: RH STG MANAGE BOWEL W/EQUIPMENT W/ASSISTANCE Description: STG Manage Bowel With Equipment With Assistance Outcome: Progressing   Problem: SCI BLADDER ELIMINATION Goal: RH STG MANAGE BLADDER WITH ASSISTANCE Description: STG Manage Bladder With mod I Assistance Outcome: Progressing Goal: RH OTHER STG BLADDER ELIMINATION GOALS W/ASSIST Description: Other STG Bladder Elimination Goals With mod I Assistance Outcome: Progressing   Problem: RH SKIN INTEGRITY Goal: RH STG SKIN FREE OF INFECTION/BREAKDOWN Description: Patient will not acquire any skin break down while on IP rehab Outcome: Progressing Goal: RH STG MAINTAIN SKIN INTEGRITY WITH ASSISTANCE Description: STG Maintain Skin Integrity With mod I Assistance. Outcome: Progressing Goal: RH STG ABLE TO PERFORM INCISION/WOUND CARE W/ASSISTANCE Description: STG Able To Perform Incision/Wound Care With mod I Assistance. Outcome: Progressing   Problem: RH SAFETY Goal: RH STG ADHERE TO SAFETY PRECAUTIONS W/ASSISTANCE/DEVICE Description: STG Adhere to Safety Precautions With mod I Assistance/Device. Outcome: Progressing   Problem: RH PAIN MANAGEMENT Goal: RH STG PAIN MANAGED AT OR BELOW PT'S PAIN GOAL Description: Pain scale <4/10 Outcome: Progressing   Problem: RH KNOWLEDGE DEFICIT SCI Goal: RH STG INCREASE KNOWLEDGE OF SELF CARE AFTER SCI Description: Patient will be able to read back instructions on self care,  his condition & preventative measures by discharge Outcome: Progressing   

## 2019-07-17 NOTE — Progress Notes (Signed)
Occupational Therapy Session Note  Patient Details  Name: Jorge Weaver. MRN: [id number] Date of Birth: 12-13-1965  Today's Date: 07/17/2019 OT Individual Time: 7096-2836 OT Individual Time Calculation (min): 54 min    Short Term Goals: Week 2:  OT Short Term Goal 1 (Week 2): Pt will complete toilet transfer with mod A OT Short Term Goal 2 (Week 2): Pt will complete sit<>stand at the sink with mod A in preparation for BADL: tasks OT Short Term Goal 3 (Week 2): Pt will complete LB dressing with min A OT Short Term Goal 4 (Week 2): Pt will complete toileting tasks with mod A  Skilled Therapeutic Interventions/Progress Updates:    Pt resting in bed upon arrival.  Pt commented that his hands and feet were "still hurting" but better then previous day.  OT intervention with focus on bathing at shower level, toileting, dressing from EOB, sit<>stand, standing balance, and activity tolerance to increase independence with BADLs. Sit<>stand and all transfers with [facility]. See Care Tool for assist level.  Sit<>stand in [place] with min A. Pt able to thread pants but requires assistance to pull over hips. Bed mobility with close supervisoin and HOB elevated. Pt commented that he was a "little woozy" near the end of the session but quickly resolved. Ataxia improving but required assistance opening containers during bathing/dressing tasks. Pt returned to bed and remained in bed with all needs within reach and bed alarm activated.   Therapy Documentation Precautions:  Precautions Precautions: Fall Precaution Comments: ataxia Restrictions Weight Bearing Restrictions: No  Pain: Pt c/o ongoing numbness/tingling/pain in B hands and BLE; MD aware, emotional support  Therapy/Group: Individual Therapy  [name] 07/17/2019, 10:09 AM

## 2019-07-17 NOTE — Progress Notes (Signed)
Physical Therapy Session Note  Patient Details  Name: Jorge Weaver. MRN: [id number] Date of Birth: [birth year]  Today's Date: 07/17/2019 PT Individual Time: 1050-1200; 1500-1530 PT Individual Time Calculation (min): 70 min and 30 min  Short Term Goals: Week 2:  PT Short Term Goal 1 (Week 2): Pt will perform least restrictive transfer with min A consistently PT Short Term Goal 2 (Week 2): Pt will initiate gait training as safe and able PT Short Term Goal 3 (Week 2): Pt will maintain dynamic sitting balance with min A  Skilled Therapeutic Interventions/Progress Updates:    Session 1: Pt received supine in bed, agreeable to PT session. Pt with continued lethargy but appears improved from previous date. Pt reports pain in B hands is also improved this date. Bed mobility min A. Squat pivot transfer bed to w/c with mod A, cues for safety. Dependent transport via w/c to/from therapy gym for time conservation. Squat pivot transfer w/c to/from mat table with mod A. Session focus on standing in LiteGait with focus on problem-solving pt's anterior lean in standing and correcting LE alignment. Pt noted to have decreased PF strength in LLE as compared to RLE and exhibits increase in L ankle DF in standing along with L hip and knee flexion. Trial heel wedge in L shoe, no improvement in stance noted. Trial wedge under toes in L shoe. With use of wedge under toes pt exhibits improved ability to PF in standing and improved control of L knee. Pt unable to sustain quad contraction for L knee extension in standing but with cues is able to elicit contraction. Pt also exhibits decreased WBing on LLE and leans to the L. With manual assist pt and tactile cueing pt able to equally WB through BLE. Pt exhibits overall improved standing posture with use of wedge and assist for body positioning. Use of LiteGait for all standing tasks this date for increased safety and standing tolerance. Pt does have onset of "dizzyness" in  standing with sustained standing activity that improves with seated rest break. Use of thigh-high TEDs this date for BP management. Pt requests to return to bed at end of session, squat pivot transfer with mod A. Sit to supine mod A for BLE management. Pt left seated in bed with needs in reach, bed alarm in place at end of session.  Session 2: Pt received supine in bed, agreeable to PT session. No complaints of pain. Session focus on LLE strengthening with supine and sidelying therex. LLE resisted PF initially with OTB progressing to GTB, 2 x 15 reps. Supine to sidelying on R side with min A. Sidelying LLE AAROM hip extension with max A, pt exhibits significant weakness with hip extension in this position as well as decreased ROM. Returned to supine at Supervision level. Supine LLE hip flexion (knee to chest) AAROM x 15 reps; hip abduction AAROM x 15 reps. Pt would benefit from ongoing LLE strengthening in hips and in PF. Pt left seated in bed with needs in reach, bed alarm in place at end of session.  Therapy Documentation Precautions:  Precautions Precautions: Fall Precaution Comments: ataxia Restrictions Weight Bearing Restrictions: No    Therapy/Group: Individual Therapy   Peter Congo, PT, DPT  07/17/2019, 12:15 PM

## 2019-07-17 NOTE — Progress Notes (Signed)
[facility] PHYSICAL MEDICINE & REHABILITATION PROGRESS NOTE   Subjective/Complaints:  Pt awake, up eating breakfast- asking for lidoderm patches- haven't been applied yet.   Face still numb- knows will take time- burning in legs- will increase duloxetine.    ROS:  Pt denies SOB, abd pain, CP, N/V/C/D, and vision changes   Objective:   No results found. No results for input(s): WBC, HGB, HCT, PLT in the last 72 hours. No results for input(s): NA, K, CL, CO2, GLUCOSE, BUN, CREATININE, CALCIUM in the last 72 hours.  Intake/Output Summary (Last 24 hours) at 07/17/2019 0854 Last data filed at 07/17/2019 0800 Gross per 24 hour  Intake 340 ml  Output 412 ml  Net -72 ml     Physical Exam: Vital Signs Blood pressure (!) 131/101, pulse 83, temperature 98.1 F (36.7 C), temperature source Oral, resp. rate 16, height 5\' 11"  (1.803 m), weight 92 kg, SpO2 95 %.   General:sleepy but awake, eating breakfast, NAD Psych: appropriate Heart:RRR Lungs: CTA B/L- no W/R/R- good air movement Abdomen: Soft, NT, ND, (+)BS    Extremities: No clubbing, cyanosis, or edema Neurologic: R>L facial weakness with facial sensation decreased R>L esp around mouth; with dysarthria- no change today , motor strength is 3-/5 in bilateral deltoid, bicep, tricep, grip, 2-hip flexor, knee extensors, ankle dorsiflexor and plantar flexor Sensory exam paresthesias to touch in the hands and feet. Cerebellar exam mild dysmetria  finger to nose to finger as well as heel to shin in bilateral upper and lower extremities Musculoskeletal: Full range of motion in all 4 extremities. No joint swelling   Assessment/Plan: 1. 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  Physiatrist is providing close team supervision and 24 hour management of active medical  problems listed below.  Physiatrist and rehab team continue to assess barriers to discharge/monitor patient progress toward functional and medical goals  Care Tool:  Bathing    Body parts bathed by patient: Right arm, Left arm, Chest, Abdomen, Front perineal area, Right upper leg, Left upper leg, Face   Body parts bathed by helper: Buttocks, Left lower leg, Right lower leg     Bathing assist Assist Level: Moderate Assistance - Patient 50 - 74%     Upper Body Dressing/Undressing Upper body dressing   What is the patient wearing?: Pull over shirt    Upper body assist Assist Level: Supervision/Verbal cueing    Lower Body Dressing/Undressing Lower body dressing      What is the patient wearing?: Pants     Lower body assist Assist for lower body dressing: Moderate Assistance - Patient 50 - 74%     Toileting Toileting    Toileting assist Assist for toileting: Dependent - Patient 0%(using Stedy)     Transfers Chair/bed transfer  Transfers assist     Chair/bed transfer assist level: Moderate Assistance - Patient 50 - 74%     Locomotion Ambulation   Ambulation assist   Ambulation activity did not occur: Safety/medical concerns  Assist level: 2 helpers Assistive device: Lite Gait Max distance: 4'   Walk 10 feet activity   Assist  Walk 10 feet activity did not occur: Safety/medical concerns        Walk 50 feet activity   Assist Walk 50 feet with 2 turns activity did not occur: Safety/medical concerns         Walk 150 feet  activity   Assist Walk 150 feet activity did not occur: Safety/medical concerns         Walk 10 feet on uneven surface  activity   Assist Walk 10 feet on uneven surfaces activity did not occur: Safety/medical concerns         Wheelchair     Assist Will patient use wheelchair at discharge?: Yes Type of Wheelchair: Manual    Wheelchair assist level: Supervision/Verbal cueing Max wheelchair distance: 100'     Wheelchair 50 feet with 2 turns activity    Assist        Assist Level: Supervision/Verbal cueing   Wheelchair 150 feet activity     Assist      Assist Level: Supervision/Verbal cueing   Blood pressure (!) 131/101, pulse 83, temperature 98.1 F (36.7 C), temperature source Oral, resp. rate 16, height 5\' 11"  (1.803 m), weight 92 kg, SpO2 95 %.  Medical Problem List and Plan: 1.  Impaired mobility and ADLs secondary to Guillain Barre Syndrome with miller Fischer Variant.              -patient may shower             -ELOS/Goals: 14-19 days; continue CIR PT OT speech Wife asked if son could visit x 1 , son will not be a caregiver therefore not meeting criteria for exception to visitation rules 2.  Antithrombotics: -DVT/anticoagulation:  Pharmaceutical: Heparin  4/20- changed to lovenox- renal function OK             -antiplatelet therapy: N/a 3. Pain Management: Tylenol prn.  4/20- look below for changes to pain regimen  Neurogenic pain in hands improved with increase lyrica to 100mg  at noc  4/27- nerve pain improving on current meds  4/28- pt likes lidocaine patches and ointment  4/29- will increase duloxetine to 60 mg QHS- for nerve pain 4. Mood: LCSW to follow for evaluation and support.              -antipsychotic agents: N/a 5. Neuropsych: This patient is capable of making decisions on his own behalf. 6. Skin/Wound Care: Routine pressure relief measures.  7. Fluids/Electrolytes/Nutrition: Monitor I/O. Check lytes in am--off fluid restriction now 8. HTN: Monitor BP tid--continue Hydralazine, metoprolol and amlodipine. Has been labile.     Was called about low blood pressure in physical therapy, systolic was in the 80s.  Patient felt dizzy.  He felt better once he was placed into the bed and is blood pressures came up in the 1 10-1 20 range.  Will discontinue amlodipine  4/26- BP better with Norvasc off.  Vitals:   07/16/19 2040 07/17/19 0429  BP: (!) 132/93  (!) 131/101  Pulse: 100 83  Resp: 18 16  Temp: 98.3 F (36.8 C) 98.1 F (36.7 C)  SpO2: 100% 95%    9. Hyponatremia: Likely due to GBS. Has received Tolvaptan, fluid restriction, and salt tablets. Slowly trending upward. 129 on 4/19.   4/22- Na up to 134- doing better- con't regimen  4/26- Na 132- stable   4/29- on 1500 cc fluid restriction 10. H/o alcohol use: Continue Thiamine and Folic acid. boderline low Mg 1.7-1.8 range. Will add supplement.  4/22- had pt see Neuropsychology.   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  12. Abnormal LFTs: Recheck in am.  13. Bladder incontinence/neurogenic bladder:   4/20- will try and get external catheter/purewick because pt has cut/abrasion on penis from condom catheter- will  also check PVRs to make sure emptying 14. Dysarthria: Severe this am with increase in right facial weakness. Discussed with Dr. Thedore Mins and Felicie Morn PA for neuro.  Benadryl recommended to help with edema. They report that speech does fluctuate on and off. NIF ordered X 2 per and neuro recommends formal consult in am if speech not improved.   NIF and vital capacity have been normal and stable.  Will discontinue 15. Insomnia: Continue Ambien prn  16. 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 Acute low back pain and N/T due to GBS-  4/20- will add lidoderm patches-2 12 hrs on; 12 hrs off; tramadol 50 mg q6 hours prn; and change gabapentin to lyrica 75 mg TID  4/21- a little sleepy- could be lyrica, but pain much better controlled- per pt- will monitor- might need to decrease lyrica.   4/22- will decrease Lyrica to 75 mg QHS due to daytime sedation.   4/23- will add lidocaine ointment 5% QID to hands and add Duloxetine 30 mg QHS  4/26- Lyrica  was increased to 100 mg QHS this weekend- helpful- con't meds  4/29- increase duloxetine- for legs "on fire".    LOS: 10 days A FACE TO FACE EVALUATION WAS PERFORMED  Charnette Younkin 07/17/2019, 8:54 AM

## 2019-07-18 ENCOUNTER — Inpatient Hospital Stay (HOSPITAL_COMMUNITY): Payer: BC Managed Care – PPO | Admitting: Occupational Therapy

## 2019-07-18 ENCOUNTER — Inpatient Hospital Stay (HOSPITAL_COMMUNITY): Payer: BC Managed Care – PPO

## 2019-07-18 ENCOUNTER — Inpatient Hospital Stay (HOSPITAL_COMMUNITY): Payer: BC Managed Care – PPO | Admitting: Physical Therapy

## 2019-07-18 ENCOUNTER — Encounter (HOSPITAL_COMMUNITY): Payer: BC Managed Care – PPO | Admitting: Psychology

## 2019-07-18 MED ORDER — SENNA 8.6 MG PO TABS
1.0000 | ORAL_TABLET | Freq: Two times a day (BID) | ORAL | Status: DC
  Administered 2019-07-18 – 2019-07-22 (×9): 8.6 mg via ORAL
  Filled 2019-07-18 (×9): qty 1

## 2019-07-18 MED ORDER — MAGNESIUM CITRATE PO SOLN
1.0000 | Freq: Once | ORAL | Status: AC
  Administered 2019-07-18: 1 via ORAL
  Filled 2019-07-18: qty 296

## 2019-07-18 NOTE — Progress Notes (Signed)
PHYSICAL MEDICINE & REHABILITATION PROGRESS NOTE   Subjective/Complaints:  Patient reports plans to get up/wash up here in a few minutes.   LBM- still hasn't had 1 since Saturday- didn't respond to Sorbitol.  Will give Mg citrate and increase senokot dosing.     ROS:  Pt denies SOB, abd pain, CP, N/V/C/D, and vision changes    Objective:   No results found. No results for input(s): WBC, HGB, HCT, PLT in the last 72 hours. No results for input(s): NA, K, CL, CO2, GLUCOSE, BUN, CREATININE, CALCIUM in the last 72 hours.  Intake/Output Summary (Last 24 hours) at 07/18/2019 0840 Last data filed at 07/18/2019 0645 Gross per 24 hour  Intake 120 ml  Output 558 ml  Net -438 ml     Physical Exam: Vital Signs Blood pressure (!) 147/111, pulse 87, temperature (!) 97.5 F (36.4 C), temperature source Oral, resp. rate 16, height 5\' 11"  (1.803 m), weight 92 kg, SpO2 97 %.   General: awake, alert, appropriate, sleepy; sitting up in bed; NAD Psych: appropriate Heart:RRR Lungs: Soft, NT, ND, (+)BS  Abdomen: Soft, NT, ND, (+)BS   Extremities: No clubbing, cyanosis, or edema Neurologic: R>L facial weakness with facial sensation decreased R>L esp around mouth; with dysarthria- no change today , motor strength is 3-/5 in bilateral deltoid, bicep, tricep, grip, hip flexor, knee extensors, ankle dorsiflexor and plantar flexor is 4-/5, except L DF which is 3-/5 Sensory exam paresthesias to touch in the hands and feet. Cerebellar exam mild dysmetria  finger to nose to finger as well as heel to shin in bilateral upper and lower extremities Musculoskeletal: Full range of motion in all 4 extremities. No joint swelling   Assessment/Plan: 1. 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  Physiatrist is providing close  team supervision and 24 hour management of active medical problems listed below.  Physiatrist and rehab team continue to assess barriers to discharge/monitor patient progress toward functional and medical goals  Care Tool:  Bathing    Body parts bathed by patient: Right arm, Left arm, Chest, Abdomen, Front perineal area, Right upper leg, Left upper leg, Face   Body parts bathed by helper: Buttocks, Left lower leg, Right lower leg     Bathing assist Assist Level: Moderate Assistance - Patient 50 - 74%     Upper Body Dressing/Undressing Upper body dressing   What is the patient wearing?: Pull over shirt    Upper body assist Assist Level: Supervision/Verbal cueing    Lower Body Dressing/Undressing Lower body dressing      What is the patient wearing?: Pants     Lower body assist Assist for lower body dressing: Moderate Assistance - Patient 50 - 74%     Toileting Toileting    Toileting assist Assist for toileting: Dependent - Patient 0%     Transfers Chair/bed transfer  Transfers assist     Chair/bed transfer assist level: Moderate Assistance - Patient 50 - 74%     Locomotion Ambulation   Ambulation assist   Ambulation activity did not occur: Safety/medical concerns  Assist level: 2 helpers Assistive device: Lite Gait Max distance: 4'   Walk 10 feet activity   Assist  Walk 10 feet activity did not occur: Safety/medical concerns        Walk 50 feet activity   Assist Walk 50 feet with 2 turns  activity did not occur: Safety/medical concerns         Walk 150 feet activity   Assist Walk 150 feet activity did not occur: Safety/medical concerns         Walk 10 feet on uneven surface  activity   Assist Walk 10 feet on uneven surfaces activity did not occur: Safety/medical concerns         Wheelchair     Assist Will patient use wheelchair at discharge?: Yes Type of Wheelchair: Manual    Wheelchair assist level:  Supervision/Verbal cueing Max wheelchair distance: 100'    Wheelchair 50 feet with 2 turns activity    Assist        Assist Level: Supervision/Verbal cueing   Wheelchair 150 feet activity     Assist      Assist Level: Supervision/Verbal cueing   Blood pressure (!) 147/111, pulse 87, temperature (!) 97.5 F (36.4 C), temperature source Oral, resp. rate 16, height 5\' 11"  (1.803 m), weight 92 kg, SpO2 97 %.  Medical Problem List and Plan: 1.  Impaired mobility and ADLs secondary to Guillain Barre Syndrome with miller Fischer Variant.              -patient may shower             -ELOS/Goals: 14-19 days; continue CIR PT OT speech Wife asked if son could visit x 1 , son will not be a caregiver therefore not meeting criteria for exception to visitation rules 2.  Antithrombotics: -DVT/anticoagulation:  Pharmaceutical: Heparin  4/20- changed to lovenox- renal function OK             -antiplatelet therapy: N/a 3. Pain Management: Tylenol prn.  4/20- look below for changes to pain regimen  Neurogenic pain in hands improved with increase lyrica to 100mg  at noc  4/27- nerve pain improving on current meds  4/28- pt likes lidocaine patches and ointment  4/29- will increase duloxetine to 60 mg QHS- for nerve pain  4/30- slightly improved 4. Mood: LCSW to follow for evaluation and support.              -antipsychotic agents: N/a 5. Neuropsych: This patient is capable of making decisions on his own behalf. 6. Skin/Wound Care: Routine pressure relief measures.  7. Fluids/Electrolytes/Nutrition: Monitor I/O. Check lytes in am--off fluid restriction now 8. HTN: Monitor BP tid--continue Hydralazine, metoprolol and amlodipine. Has been labile.     Was called about low blood pressure in physical therapy, systolic was in the 80s.  Patient felt dizzy.  He felt better once he was placed into the bed and is blood pressures came up in the 1 10-1 20 range.  Will discontinue amlodipine  4/30-  BP a little elevated- 130s-140-s/100s Vitals:   07/17/19 1932 07/18/19 0501  BP: (!) 137/102 (!) 147/111  Pulse: 93 87  Resp: 18 16  Temp: 97.9 F (36.6 C) (!) 97.5 F (36.4 C)  SpO2: 98% 97%    9. Hyponatremia: Likely due to GBS. Has received Tolvaptan, fluid restriction, and salt tablets. Slowly trending upward. 129 on 4/19.   4/22- Na up to 134- doing better- con't regimen  4/26- Na 132- stable   4/29- on 1500 cc fluid restriction  4/30-will check labs Monday   10. H/o alcohol use: Continue Thiamine and Folic acid. boderline low Mg 1.7-1.8 range. Will add supplement.  4/22- had pt see Neuropsychology.   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  12.  Abnormal LFTs: Recheck in am.  13. Bladder incontinence/neurogenic bladder:   4/20- will try and get external catheter/purewick because pt has cut/abrasion on penis from condom catheter- will also check PVRs to make sure emptying 14. Dysarthria: Severe this am with increase in right facial weakness. Discussed with Dr. Thedore Mins and Felicie Morn PA for neuro.  Benadryl recommended to help with edema. They report that speech does fluctuate on and off. NIF ordered X 2 per and neuro recommends formal consult in am if speech not improved.   NIF and vital capacity have been normal and stable.  Will discontinue 15. Insomnia: Continue Ambien prn  16. Neurogenic bowel/constipation- no control  4/20- will see if can go again on his own- if cannot, will need bowel program for awhile  4/27- LBM Saturday- give sorbitol and see if able to go   4/28- LBM yesterday- doing OK with sorbitol prn  4/30- Now pt said no BM since Saturday- will order Mg citrate and add Senokot 1 tab BID  17. Acute low back pain and N/T due to GBS-  4/20- will add lidoderm patches-2 12 hrs on; 12 hrs off; tramadol 50 mg q6 hours prn; and change gabapentin to lyrica 75 mg TID  4/21- a little sleepy- could be lyrica, but pain much better  controlled- per pt- will monitor- might need to decrease lyrica.   4/22- will decrease Lyrica to 75 mg QHS due to daytime sedation.   4/23- will add lidocaine ointment 5% QID to hands and add Duloxetine 30 mg QHS  4/26- Lyrica was increased to 100 mg QHS this weekend- helpful- con't meds  4/29- increase duloxetine- for legs "on fire".   4/30- slightly improved nerve pain- duloxetine 60 mg daily.   LOS: 11 days A FACE TO FACE EVALUATION WAS PERFORMED  Cleve Paolillo 07/18/2019, 8:40 AM

## 2019-07-18 NOTE — Progress Notes (Signed)
Occupational Therapy Session Note  Patient Details  Name: Jorge Weaver. MRN: [id number] Date of Birth: 10/07/1965  Today's Date: 07/18/2019 OT Individual Time: 1321-1415 OT Individual Time Calculation (min): 54 min    Short Term Goals: Week 2:  OT Short Term Goal 1 (Week 2): Pt will complete toilet transfer with mod A OT Short Term Goal 2 (Week 2): Pt will complete sit<>stand at the sink with mod A in preparation for BADL: tasks OT Short Term Goal 3 (Week 2): Pt will complete LB dressing with min A OT Short Term Goal 4 (Week 2): Pt will complete toileting tasks with mod A  Skilled Therapeutic Interventions/Progress Updates:  Patient met lying supine in bed asleep. Patient very lethargic this date requesting this OT return in 15 minutes. OT assisted patient with repositioning in supine for proper trunk alignment to promote comfort. Patient reporting 8/10 pain in bilateral hands 2/2 GBS (premedicated). OT returned after 15 minutes with patient in agreement to complete lunch seated EOB. Min A required for supine <> EOB transfer. Patient able to maintain static sitting balance for 15 min to complete meal. Patient indicating need to void with request for return to supine. Return to supine with Min A to assist BLE from EOB to bed surface. Patient required assistance with clothing management and positioning of urinal secondary to numbness in B hands. Squat-pivot transfer EOB <> wc with Mod A. Total A for wc transport to and from outside area near hospital atrium. Patient engaged in thera-putty exercises for FMC. Session concluded with patient lying supine in bed with call bell within reach, bed alarm activated, and all needs met.   Therapy Documentation Precautions:  Precautions Precautions: Fall Precaution Comments: ataxia Restrictions Weight Bearing Restrictions: No General: General OT Amount of Missed Time: 21 Minutes PT Missed Treatment Reason: Patient fatigue   Therapy/Group:  Individual Therapy  Destanae R Howerton-Davis 07/18/2019, 1:30 PM 

## 2019-07-18 NOTE — Progress Notes (Signed)
Physical Therapy Session Note  Patient Details  Name: Jorge Weaver. MRN: [id number] Date of Birth: 21-Feb-1966  Today's Date: 07/18/2019 PT Individual Time: 0017-4944 PT Individual Time Calculation (min): 10 min   Short Term Goals: Week 1:  PT Short Term Goal 1 (Week 1): Pt will complete least restrictive transfer with mod A PT Short Term Goal 1 - Progress (Week 1): Met PT Short Term Goal 2 (Week 1): Pt will initiate gait training as safe and able PT Short Term Goal 2 - Progress (Week 1): Progressing toward goal PT Short Term Goal 3 (Week 1): Pt will tolerate sitting up out of bed x 1 hour PT Short Term Goal 3 - Progress (Week 1): Met Week 2:  PT Short Term Goal 1 (Week 2): Pt will perform least restrictive transfer with min A consistently PT Short Term Goal 2 (Week 2): Pt will initiate gait training as safe and able PT Short Term Goal 3 (Week 2): Pt will maintain dynamic sitting balance with min A  Skilled Therapeutic Interventions/Progress Updates:    Pt asleep in recliner upon arrival and minimally arousable. Pt reports he feels dizzy/woozy and that he almost fell out of the chair. BP taken and WFL. Pt agreeable to transfer back to bed. Requires mod assist and extra time for processing for squat pivot. Supervision to return to supine. Attempted to re-engage and focus on bed level exercises to target LE strengthening but pt unable to stay awake enough to participate. RN made aware and all needs in reach.    Seated in recliner: 120/94 mmHg; HR = 69 lethargic and reports wooziness   Therapy Documentation Precautions:  Precautions Precautions: Fall Precaution Comments: ataxia Restrictions Weight Bearing Restrictions: No General: PT Amount of Missed Time (min): 50 Minutes PT Missed Treatment Reason: Patient fatigue  Pain: Pt did not report pain. Very lethargic and unable to stay awake. Just reports dizziness/wooziness.    Therapy/Group: Individual Therapy  Canary Brim  Ivory Broad, PT, DPT, CBIS  07/18/2019, 11:01 AM

## 2019-07-18 NOTE — Progress Notes (Signed)
Physical Therapy Session Note  Patient Details  Name: Jorge Weaver. MRN: [id number] Date of Birth: 1965-10-11  Today's Date: 07/18/2019 PT Individual Time: 0915-0950 PT Individual Time Calculation (min): 35 min  PT Missed Time: 25 min Missed Time Reason: fatigue  Short Term Goals: Week 2:  PT Short Term Goal 1 (Week 2): Pt will perform least restrictive transfer with min A consistently PT Short Term Goal 2 (Week 2): Pt will initiate gait training as safe and able PT Short Term Goal 3 (Week 2): Pt will maintain dynamic sitting balance with min A  Skilled Therapeutic Interventions/Progress Updates:    Pt received supine in bed asleep, arousable but requests therapist return after 10-15 min so that he can rest. This therapist returns after 15 min and pt agreeable with encouragement to participate in therapy session. Pt reports pain is well-controlled this date, does have ongoing numbness in BUE that he reports feels increased this date. Supine to sit with mod A for BLE management and some trunk control . Once in sitting pt reports feeling "woozy", returns to supine with min A. Supine BP 138/98 but assisted pt with donning thigh-high TEDs. Also assisted pt with donning shorts via bridging and rolling L/R with min A. Returned to sitting with mod A. Seated BP 111/88 but pt does report ongoing "wooziness". Pt also closes eyes and appears to be falling asleep while seated on side of bed. Sit to stand with min A to stedy. Stedy transfer to recliner. Encouraged pt to spend time sitting upright out of bed despite ongoing lethargy for improved upright tolerance. Pt agreeable. Pt left seated in recliner in room with needs in reach, quick release belt and chair alarm in place. Notified pt's nurse of lethargy this date. Pt missed total of 25 in of scheduled therapy session due to fatigue.  Therapy Documentation Precautions:  Precautions Precautions: Fall Precaution Comments: ataxia Restrictions Weight  Bearing Restrictions: No    Therapy/Group: Individual Therapy   Peter Congo, PT, DPT  07/18/2019, 9:51 AM

## 2019-07-18 NOTE — Consult Note (Signed)
Neuropsychological Consultation   Patient:   Jorge Weaver.   DOB:   09/22/1965  MR Number:  [id number]  Location:  [facility] [address] CENTER [id number] [place] [address] Dept: [phone] Loc: [phone]           Date of Service:   07/18/2019  Start Time:   8:15 AM End Time:   9:00 AM  Provider/Observer:  Jorge Weaver, Psy.D.       Clinical Neuropsychologist       Billing Code/Service: [id number]/[id number]  Chief Complaint:    Jorge Weaver. is a 54 year old male with history of hypertension, cervical spondylosis with myelopathy and decompression on 12/2018.  Patient also has a history of alcohol use.  Patient was admitted on 06/29/2019 with increasing numbness and bilateral upper extremity/bilateral lower extremity progressing to umbilicus and face, recurrent falls and hesitancy when walking.  He was severely hypokalemic which has been supplemented.  MRI brain was negative for acute changes.  MRI cervical spine showed ACDF C5-C7 with resolved stenosis and moderate neural foraminal stenosis C5 and C8 nerve root, severe at L-C6 nerve level.  MRI lumbar spine showed evidence of degenerative disc disease moderate stenosis and advanced degeneration with moderate to severe stenosis.  Neurosurgery felt that surgical intervention was not needed.  Patient treated with IV thiamine due to history of alcohol abuse and evidence of microcytic ptotic anemia and rule out Jorge Weaver syndrome as hypokalemia had improved without improvement in symptoms.  Labs for heavy metals and nutritional deficiency negative.  Patient continues to be limited by neuropathy with weakness affecting functional status and was recommended for the comprehensive rehabilitation program due to functional decline.  07/18/2019:  Patient with increasing strength in legs (most) and hands with continued pain and changes in somatosensory functioning.  Mood is  improving with less anxiety.  Patient was oriented and alert with increased verbal interactions.  Clearly improving over past week.    Reason for Service:  Patient was referred for neuropsychological consultation due to coping and adjustment issues with extended hospital stay and significant reduction in functional status.  Below is the HPI for the current admission.  HPI: Jorge Weaver is a 54 year old male with history of HTN, cervical spondylosis with myelopathy s/p decompression 12/2018, ETOH use who was admitted on 06/29/19 with increasing numbness in BUE/BLE progressing to umbilicus and face, recurrent falls and hesitancy. He was severely hypokalemic- K-2.2 at admission which was supplemented. MRI brain was negative for acute changes. MRI cervical spine showed ACDF C5-C7 with resolved stenosis and moderate neural foraminal stenosis C5 and C8 nerve roots, severe at L-C6 nerve level. MRI lumbar spine showed progression fo DD L4/L5 with new right foraminal annular fissure and moderate right L4 foraminal stenosis, advanced L5/S1 degeneration with moderate to severe L5 foraminal stenosis.  Dr. Maisie Weaver consulted and felt that surgical intervention not needed.   Dr. Laurence Weaver consulted for input and recommended full work up as well as IV thiamine due to history of alcohol abuse with evidence of macrocytic anemia and to rule out GBS as hypokalemia had improved without improvement in symptoms.  Labs for heavy metals and nutritional deficiency negative. RPR/HIV negative. LP done revealing albuminocytologic dissociation and was treated with IVIG X 5 days--completed on 4/16.   Blood cultures done 4/15 due to fevers and pending.  He developed hyponatremia with drop in Na-125 this weekend --was treated briefly with lasix, tolvaptan and IVF  with improvement in sodium levels. Patient continues to be limited by neuropathy with weakness affecting functional status and CIR recommended due to functional decline.   Current  Status:  Patient reports that he is generally coping adequately with the extended hospital stay and is relieved to some degree to have a better idea about what is causing his difficulties.  The patient was aware of issues with his back.  He acknowledged alcohol use prior.  The patient reports that he is seeing some progress in therapy and is hoping to be able to return to work as he improves.  Behavioral Observation: Jorge Weaver.  presents as a 54 y.o.-year-old Right African American Male who appeared his stated age. his dress was Appropriate and he was Well Groomed and his manners were Appropriate to the situation.  his participation was indicative of Appropriate and Attentive behaviors.  There were physical disabilities noted.  he displayed an appropriate level of cooperation and motivation.     Interactions:    Active Appropriate and Attentive  Attention:   within normal limits and attention span and concentration were age appropriate  Memory:   within normal limits; recent and remote memory intact  Visuo-spatial:  not examined  Speech (Volume):  low  Speech:   normal; slurred due to motor function deficits  Thought Process:  Coherent and Relevant  Though Content:  WNL; not suicidal and not homicidal  Orientation:   person, place, time/date and situation  Judgment:   Good  Planning:   Good  Affect:    Appropriate  Mood:    Dysphoric  Insight:   Good  Intelligence:   normal  Medical History:   Past Medical History:  Diagnosis Date  . Anemia    low iron  . Asthma    as a child  . Cervical spondylosis with myelopathy   . Fatty liver   . GERD (gastroesophageal reflux disease)   . Heart murmur    when he was younger   . Hypertension   . Pneumonia        Psychiatric History:  No prior psychiatric history  Family Med/Psych History:  Family History  Problem Relation Age of Onset  . Hypertension Mother   . Heart attack Father   . Colon cancer Sister   .  Esophageal cancer Neg Hx   . Rectal cancer Neg Hx   . Stomach cancer Neg Hx     Risk of Suicide/Violence: virtually non-existent patient denies suicidal or homicidal ideation.  Impression/DX:  Jorge Weaver. is a 54 year old male with history of hypertension, cervical spondylosis with myelopathy and decompression on 12/2018.  Patient also has a history of alcohol use.  Patient was admitted on 06/29/2019 with increasing numbness and bilateral upper extremity/bilateral lower extremity progressing to umbilicus and face, recurrent falls and hesitancy when walking.  He was severely hypokalemic which has been supplemented.  MRI brain was negative for acute changes.  MRI cervical spine showed ACDF C5-C7 with resolved stenosis and moderate neural foraminal stenosis C5 and C8 nerve root, severe at L-C6 nerve level.  MRI lumbar spine showed evidence of degenerative disc disease moderate stenosis and advanced degeneration with moderate to severe stenosis.  Neurosurgery felt that surgical intervention was not needed.  Patient treated with IV thiamine due to history of alcohol abuse and evidence of microcytic ptotic anemia and rule out Jorge Weaver syndrome as hypokalemia had improved without improvement in symptoms.  Labs for heavy metals and nutritional deficiency negative.  Patient  continues to be limited by neuropathy with weakness affecting functional status and was recommended for the comprehensive rehabilitation program due to functional decline.  07/18/2019:  Patient with increasing strength in legs (most) and hands with continued pain and changes in somatosensory functioning.  Mood is improving with less anxiety.  Patient was oriented and alert with increased verbal interactions.  Clearly improving over past week.   Disposition/Plan:  Continued working on coping and adjustment issues and dealing with residual motor deficits from Guillain-Barr syndrome as the likely culprit.  Will follow up with patient next  week.  Diagnosis:    GBS        Electronically Signed   _______________________ Jorge Weaver, Psy.D.

## 2019-07-19 DIAGNOSIS — N319 Neuromuscular dysfunction of bladder, unspecified: Secondary | ICD-10-CM

## 2019-07-19 DIAGNOSIS — E871 Hypo-osmolality and hyponatremia: Secondary | ICD-10-CM

## 2019-07-19 DIAGNOSIS — K592 Neurogenic bowel, not elsewhere classified: Secondary | ICD-10-CM

## 2019-07-19 DIAGNOSIS — R7401 Elevation of levels of liver transaminase levels: Secondary | ICD-10-CM

## 2019-07-19 DIAGNOSIS — R0989 Other specified symptoms and signs involving the circulatory and respiratory systems: Secondary | ICD-10-CM

## 2019-07-19 DIAGNOSIS — D62 Acute posthemorrhagic anemia: Secondary | ICD-10-CM

## 2019-07-19 DIAGNOSIS — I10 Essential (primary) hypertension: Secondary | ICD-10-CM

## 2019-07-19 DIAGNOSIS — M792 Neuralgia and neuritis, unspecified: Secondary | ICD-10-CM

## 2019-07-19 NOTE — Progress Notes (Signed)
[place] PHYSICAL MEDICINE & REHABILITATION PROGRESS NOTE   Subjective/Complaints: Patient seen laying in bed this morning.  He states he slept well overnight.  He slept fairly per sleep chart.  He states he is still constipated and needs to have a bowel movement.  ROS: + Constipation. Denies CP, SOB, N/V/D  Objective:   No results found. No results for input(s): WBC, HGB, HCT, PLT in the last 72 hours. No results for input(s): NA, K, CL, CO2, GLUCOSE, BUN, CREATININE, CALCIUM in the last 72 hours.  Intake/Output Summary (Last 24 hours) at 07/19/2019 0921 Last data filed at 07/19/2019 0801 Gross per 24 hour  Intake 120 ml  Output 425 ml  Net -305 ml     Physical Exam: Vital Signs Blood pressure 109/73, pulse 77, temperature 98.4 F (36.9 C), resp. rate 16, height 5\' 11"  (1.803 m), weight 92 kg, SpO2 94 %. Constitutional: No distress . Vital signs reviewed. HENT: Normocephalic.  Atraumatic. Eyes: EOMI. No discharge. Cardiovascular: No JVD. Respiratory: Normal effort.  No stridor. GI: Non-distended. Skin: Warm and dry.  Intact. Psych: Normal mood.  Normal behavior. Musc: No edema in extremities.  No tenderness in extremities. Neuro: Alert Motor: Bilateral upper extremities: 4 -/5 proximal distal Bilateral lower extremities: 4/5 proximally, 3+ distally   Assessment/Plan: 1. 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  Physiatrist is providing close team supervision and 24 hour management of active medical problems listed below.  Physiatrist and rehab team continue to assess barriers to discharge/monitor patient progress toward functional and medical goals  Care Tool:  Bathing    Body parts bathed by patient: Right arm, Left arm, Chest, Abdomen, Front perineal area, Right upper leg, Left upper leg, Face   Body parts bathed by helper: Buttocks, Left lower leg, Right lower leg     Bathing assist Assist Level: Moderate Assistance - Patient 50 - 74%     Upper Body Dressing/Undressing Upper body dressing   What is the patient wearing?: Pull over shirt    Upper body assist Assist Level: Supervision/Verbal cueing    Lower Body Dressing/Undressing Lower body dressing      What is the patient wearing?: Pants     Lower body assist Assist for lower body dressing: Moderate Assistance - Patient 50 - 74%     Toileting Toileting    Toileting assist Assist for toileting: Maximal Assistance - Patient 25 - 49%     Transfers Chair/bed transfer  Transfers assist     Chair/bed transfer assist level: Moderate Assistance - Patient 50 - 74%     Locomotion Ambulation   Ambulation assist   Ambulation activity did not occur: Safety/medical concerns  Assist level: 2 helpers Assistive device: Lite Gait Max distance: 4'   Walk 10 feet activity   Assist  Walk 10 feet activity did not occur: Safety/medical concerns        Walk 50 feet activity   Assist Walk 50 feet with 2 turns activity did not occur: Safety/medical concerns         Walk 150 feet activity   Assist Walk 150 feet activity did not occur: Safety/medical concerns         Walk 10 feet on uneven surface  activity   Assist Walk 10 feet on uneven surfaces activity did not occur: Safety/medical concerns         Wheelchair  Assist Will patient use wheelchair at discharge?: Yes Type of Wheelchair: Manual    Wheelchair assist level: Supervision/Verbal cueing Max wheelchair distance: 100'    Wheelchair 50 feet with 2 turns activity    Assist        Assist Level: Supervision/Verbal cueing   Wheelchair 150 feet activity     Assist      Assist Level: Supervision/Verbal cueing   Blood pressure 109/73, pulse 77, temperature 98.4 F (36.9 C), resp. rate 16, height 5\' 11"  (1.803  m), weight 92 kg, SpO2 94 %.  Medical Problem List and Plan: 1.  Impaired mobility and ADLs secondary to Guillain Barre Syndrome with miller Fischer Variant.   Continue CIR 2.  Antithrombotics: -DVT/anticoagulation:  Pharmaceutical: Heparin  4/20- changed to lovenox- renal function OK             -antiplatelet therapy: N/a 3. Pain Management: Low back pain + neuropathic pain  Tylenol prn.  lyrica to 100mg  at noc  Continue lidocaine patches and ointment  Increased duloxetine to 60 mg QHS- for nerve pain  Controlled with meds on 5/1 4. Mood: LCSW to follow for evaluation and support.              -antipsychotic agents: N/a 5. Neuropsych: This patient is capable of making decisions on his own behalf. 6. Skin/Wound Care: Routine pressure relief measures.  7. Fluids/Electrolytes/Nutrition: Monitor I/Os.  8. HTN: Monitor BP tid--continue Hydralazine, metoprolol. Has been labile.   DC'd amlodipine  Vitals:   07/18/19 2004 07/19/19 0610  BP: (!) 122/96 109/73  Pulse: 91 77  Resp: 18 16  Temp: 97.8 F (36.6 C) 98.4 F (36.9 C)  SpO2: 96% 94%   Diastolic slightly labile on 5/1 9. Hyponatremia: Has received Tolvaptan, fluid restriction, and salt tablets.   4/29- on 1500 cc fluid restriction  Sodium 132 on 4/26, labs ordered for Monday 10. H/o alcohol use: Continue Thiamine and Folic acid. boderline low Mg 1.7-1.8 range.  Added supplement.  4/22- had pt see Neuropsychology.   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   Hemoglobin 10.8 on 4/26  Continue to monitor 12. Abnormal LFTs:   Elevated on 4/20, labs ordered for Monday 13. Bladder incontinence/neurogenic bladder:   Improving 14. Dysarthria:   Stable 15. Insomnia: Continue Ambien prn  16. Neurogenic bowel/constipation- no control  Ordered Mg citrate and add Senokot 1 tab BID on 4/30  Improving   LOS: 12 days A FACE TO FACE EVALUATION WAS PERFORMED  Chen Saadeh Saturday 07/19/2019, 9:21  AM

## 2019-07-20 ENCOUNTER — Inpatient Hospital Stay (HOSPITAL_COMMUNITY): Payer: BC Managed Care – PPO | Admitting: Occupational Therapy

## 2019-07-20 MED ORDER — PREGABALIN 50 MG PO CAPS
100.0000 mg | ORAL_CAPSULE | Freq: Two times a day (BID) | ORAL | Status: DC
  Administered 2019-07-20 – 2019-07-23 (×6): 100 mg via ORAL
  Filled 2019-07-20 (×6): qty 2

## 2019-07-20 NOTE — Progress Notes (Signed)
Occupational Therapy Session Note  Patient Details  Name: Jorge Weaver. MRN: [id number] Date of Birth: [birth year]  Today's Date: 07/20/2019 OT Individual Time: 5573-2202 OT Individual Time Calculation (min): 59 min   Short Term Goals: Week 2:  OT Short Term Goal 1 (Week 2): Pt will complete toilet transfer with mod A OT Short Term Goal 2 (Week 2): Pt will complete sit<>stand at the sink with mod A in preparation for BADL: tasks OT Short Term Goal 3 (Week 2): Pt will complete LB dressing with min A OT Short Term Goal 4 (Week 2): Pt will complete toileting tasks with mod A  Skilled Therapeutic Interventions/Progress Updates:    Pt greeted in bed, finishing up bed bath with NT. He reported being premedicated for pain and having no dizziness/wooziness at present. BP assessed with reading of 120/86. Thigh high Teds donned bedlevel with pt assisting with pulling material over thighs. Supine<sit completed with CGA, Min-Mod A for squat pivot<w/c. While sitting at the sink, pt engaged in oral care and UB dressing tasks with setup assistance. He c/o nerve pain in his hands, stating he felt like they were "on fire" whenever he used them. Transitioned to the dayroom. Pt completed 2 sit<stands at elevated table with Min-Mod A for power up. Note that in standing pt with significant hyperextension in both knees, almost tipping w/c backwards. On second stand, he was able to improve bend in the Rt knee without buckling but not in the Lt. Worked on knee control by transitioning from sit<squat positions, using the table for B UE support. OT placed a foam pad under his hands with dycem with reportedly increased comfort because surface was soft vs hard. Pt able to maintain squatted position with buttocks partially cleared from chair for 3-5 second holds before needing seated rest. Attempted small w/c push ups for UB strengthening with pt reporting too much pain when attempting and therefore this exercise was terminated.  He requested to return to bed. Pt was escorted back to room, lateral scoot transfer completed with Min-Mod A. Pt returned to supine with CGA and boosted himself up in bed using the headboard. We removed his Teds per request. Left him with all needs within reach and bed alarm set.   Pt denied symptoms of orthostatics throughout tx, though was frequently asked, including after stands.        Therapy Documentation Precautions:  Precautions Precautions: Fall Precaution Comments: ataxia Restrictions Weight Bearing Restrictions: No ADL: ADL Eating: Minimal assistance Grooming: Moderate assistance Upper Body Bathing: Moderate assistance Lower Body Bathing: Maximal assistance Upper Body Dressing: Moderate assistance Lower Body Dressing: Maximal assistance Toileting: Maximal assistance Toilet Transfer: Maximal verbal cueing Tub/Shower Transfer: Maximal assistance      Therapy/Group: Individual Therapy  Daryn Hicks A Lillionna Nabi 07/20/2019, 12:39 PM

## 2019-07-20 NOTE — Progress Notes (Signed)
[place] PHYSICAL MEDICINE & REHABILITATION PROGRESS NOTE   Subjective/Complaints: Patient seen sitting up in bed this morning.  He states he slept well overnight.  Sleep chart not updated.  He states he had a bowel movement yesterday.  He states his "arms are on fire".  ROS: + Neuropathic pain. Denies CP, SOB, N/V/D  Objective:   No results found. No results for input(s): WBC, HGB, HCT, PLT in the last 72 hours. No results for input(s): NA, K, CL, CO2, GLUCOSE, BUN, CREATININE, CALCIUM in the last 72 hours.  Intake/Output Summary (Last 24 hours) at 07/20/2019 0827 Last data filed at 07/20/2019 0820 Gross per 24 hour  Intake 700 ml  Output 100 ml  Net 600 ml     Physical Exam: Vital Signs Blood pressure (!) 125/99, pulse 78, temperature 98 F (36.7 C), temperature source Oral, resp. rate 16, height 5\' 11"  (1.803 m), weight 92 kg, SpO2 99 %. Constitutional: No distress . Vital signs reviewed. HENT: Normocephalic.  Atraumatic. Eyes: EOMI. No discharge. Cardiovascular: No JVD. Respiratory: Normal effort.  No stridor. GI: Non-distended. Skin: Warm and dry.  Intact. Psych: Normal mood.  Normal behavior. Musc: No edema in extremities.  No tenderness in extremities. Neuro: Alert Motor: Bilateral upper extremities: 4 -/5 proximal distal, unchanged Bilateral lower extremities: 4/5 proximally, 3+ distally   Assessment/Plan: 1. 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  Physiatrist is providing close team supervision and 24 hour management of active medical problems listed below.  Physiatrist and rehab team continue to assess barriers to discharge/monitor patient progress toward functional and medical goals  Care Tool:  Bathing    Body parts bathed by patient: Right arm, Left arm, Chest, Abdomen, Front perineal  area, Right upper leg, Left upper leg, Face   Body parts bathed by helper: Buttocks, Left lower leg, Right lower leg     Bathing assist Assist Level: Moderate Assistance - Patient 50 - 74%     Upper Body Dressing/Undressing Upper body dressing   What is the patient wearing?: Pull over shirt    Upper body assist Assist Level: Supervision/Verbal cueing    Lower Body Dressing/Undressing Lower body dressing      What is the patient wearing?: Pants     Lower body assist Assist for lower body dressing: Moderate Assistance - Patient 50 - 74%     Toileting Toileting    Toileting assist Assist for toileting: Maximal Assistance - Patient 25 - 49%     Transfers Chair/bed transfer  Transfers assist     Chair/bed transfer assist level: Moderate Assistance - Patient 50 - 74%     Locomotion Ambulation   Ambulation assist   Ambulation activity did not occur: Safety/medical concerns  Assist level: 2 helpers Assistive device: Lite Gait Max distance: 4'   Walk 10 feet activity   Assist  Walk 10 feet activity did not occur: Safety/medical concerns        Walk 50 feet activity   Assist Walk 50 feet with 2 turns activity did not occur: Safety/medical concerns         Walk 150 feet activity   Assist Walk 150 feet activity did not occur: Safety/medical concerns         Walk 10 feet on uneven surface  activity   Assist Walk 10 feet on uneven surfaces activity did not occur: Safety/medical concerns  Wheelchair     Assist Will patient use wheelchair at discharge?: Yes Type of Wheelchair: Manual    Wheelchair assist level: Supervision/Verbal cueing Max wheelchair distance: 100'    Wheelchair 50 feet with 2 turns activity    Assist        Assist Level: Supervision/Verbal cueing   Wheelchair 150 feet activity     Assist      Assist Level: Supervision/Verbal cueing   Blood pressure (!) 125/99, pulse 78, temperature 98  F (36.7 C), temperature source Oral, resp. rate 16, height 5\' 11"  (1.803 m), weight 92 kg, SpO2 99 %.  Medical Problem List and Plan: 1.  Impaired mobility and ADLs secondary to Guillain Barre Syndrome with miller Fischer Variant.   Continue CIR 2.  Antithrombotics: -DVT/anticoagulation:  Pharmaceutical: Heparin  4/20- changed to lovenox- renal function OK             -antiplatelet therapy: N/a 3. Pain Management: Low back pain + neuropathic pain  Tylenol prn.  lyrica to 100mg  at noc, increased to twice daily on 5/2  Continue lidocaine patches and ointment  Increased duloxetine to 60 mg QHS- for nerve pain 4. Mood: LCSW to follow for evaluation and support.              -antipsychotic agents: N/a 5. Neuropsych: This patient is capable of making decisions on his own behalf. 6. Skin/Wound Care: Routine pressure relief measures.  7. Fluids/Electrolytes/Nutrition: Monitor I/Os.  8. HTN: Monitor BP tid--continue Hydralazine, metoprolol. Has been labile.   DC'd amlodipine  Vitals:   07/19/19 2220 07/20/19 0506  BP: 118/76 (!) 125/99  Pulse: 73 78  Resp:  16  Temp:  98 F (36.7 C)  SpO2:  99%   Labile with diastolic elevation on 5/2, monitor trend 9. Hyponatremia: Has received Tolvaptan, fluid restriction, and salt tablets.   4/29- on 1500 cc fluid restriction  Sodium 132 on 4/26, labs ordered for tomorrow 10. H/o alcohol use: Continue Thiamine and Folic acid. boderline low Mg 1.7-1.8 range.  Added supplement.  4/22- had pt see Neuropsychology.   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   Hemoglobin 10.8 on 4/26  Continue to monitor 12. Abnormal LFTs:   Elevated on 4/20, labs ordered for tomorrow 13. Bladder incontinence/neurogenic bladder:   Improving 14. Dysarthria:   Stable 15. Insomnia: Continue Ambien prn  16. Neurogenic bowel/constipation- no control  Ordered Mg citrate and add Senokot 1 tab BID on 4/30  Improving   LOS: 13  days A FACE TO FACE EVALUATION WAS PERFORMED  Ankit Lorie Phenix 07/20/2019, 8:27 AM

## 2019-07-21 ENCOUNTER — Inpatient Hospital Stay (HOSPITAL_COMMUNITY): Payer: BC Managed Care – PPO

## 2019-07-21 ENCOUNTER — Inpatient Hospital Stay (HOSPITAL_COMMUNITY): Payer: BC Managed Care – PPO | Admitting: Physical Therapy

## 2019-07-21 LAB — CBC
HCT: 33.2 % — ABNORMAL LOW (ref 39.0–52.0)
Hemoglobin: 10.9 g/dL — ABNORMAL LOW (ref 13.0–17.0)
MCH: 33.2 pg (ref 26.0–34.0)
MCHC: 32.8 g/dL (ref 30.0–36.0)
MCV: 101.2 fL — ABNORMAL HIGH (ref 80.0–100.0)
Platelets: 384 10*3/uL (ref 150–400)
RBC: 3.28 MIL/uL — ABNORMAL LOW (ref 4.22–5.81)
RDW: 14.4 % (ref 11.5–15.5)
WBC: 4.6 10*3/uL (ref 4.0–10.5)
nRBC: 0 % (ref 0.0–0.2)

## 2019-07-21 LAB — COMPREHENSIVE METABOLIC PANEL
ALT: 31 U/L (ref 0–44)
AST: 28 U/L (ref 15–41)
Albumin: 2.8 g/dL — ABNORMAL LOW (ref 3.5–5.0)
Alkaline Phosphatase: 61 U/L (ref 38–126)
Anion gap: 8 (ref 5–15)
BUN: 10 mg/dL (ref 6–20)
CO2: 28 mmol/L (ref 22–32)
Calcium: 9.1 mg/dL (ref 8.9–10.3)
Chloride: 103 mmol/L (ref 98–111)
Creatinine, Ser: 0.8 mg/dL (ref 0.61–1.24)
GFR calc Af Amer: >60 mL/min (ref >60)
GFR calc non Af Amer: >60 mL/min (ref >60)
Glucose, Bld: 109 mg/dL — ABNORMAL HIGH (ref 70–99)
Potassium: 3.6 mmol/L (ref 3.5–5.1)
Sodium: 139 mmol/L (ref 135–145)
Total Bilirubin: 0.6 mg/dL (ref 0.3–1.2)
Total Protein: 7.7 g/dL (ref 6.5–8.1)

## 2019-07-21 NOTE — Progress Notes (Signed)
Physical Therapy Session Note  Patient Details  Name: Jorge Weaver. MRN: [id number] Date of Birth: [birth year]  Today's Date: 07/21/2019 PT Individual Time: 0850-0920 PT Individual Time Calculation (min): 30 min   Short Term Goals: Week 2:  PT Short Term Goal 1 (Week 2): Pt will perform least restrictive transfer with min A consistently PT Short Term Goal 2 (Week 2): Pt will initiate gait training as safe and able PT Short Term Goal 3 (Week 2): Pt will maintain dynamic sitting balance with min A  Skilled Therapeutic Interventions/Progress Updates:    Pt arousable and agreeable to therapy session. Total assist to don Tedhose and max assist for shoes (once EOB). Performed bed mobility to come to EOB with supervision with use of bedrails and HOB elevated. Focused on functional dynamic sitting balance and fine motor task for self feeding for breakfast. Some assist needed to open juice tops. Pt using built up handle for utensil. Noted ataxia in RUE during self feeding. Maintained sitting balance with close supervision to occasional min assist needed due to L and posterior LOB. Mod assist for squat pivot transfer to w/c with assist needed for clearance of bottom and decreased coordination. Set up with all needs in reach and wife at bedside upon departure.   Pt denies any complaints of dizziness this AM and reports feeling overall pretty good today. Had no recall of missed therapies on Friday.   Therapy Documentation Precautions:  Precautions Precautions: Fall Precaution Comments: ataxia Restrictions Weight Bearing Restrictions: No  Pain: Pain Assessment Pain Scale: 0-10 Pain Score: 5  Pain Type: Acute pain Pain Location: Hand Pain Orientation: Right;Left Pain Descriptors / Indicators: Tingling Pain Frequency: Constant Pain Onset: On-going Patients Stated Pain Goal: 3 Pain Intervention(s): Medication (See eMAR)    Therapy/Group: Individual Therapy  Karolee Stamps Darrol Poke, PT, DPT, CBIS  07/21/2019, 9:27 AM

## 2019-07-21 NOTE — Plan of Care (Signed)
  Problem: Consults Goal: RH SPINAL CORD INJURY PATIENT EDUCATION Description:  See Patient Education module for education specifics.  Outcome: Progressing Goal: Skin Care Protocol Initiated - if Braden Score 18 or less Description: If consults are not indicated, leave blank or document N/A Outcome: Progressing Goal: Nutrition Consult-if indicated Outcome: Progressing   Problem: SCI BOWEL ELIMINATION Goal: RH STG MANAGE BOWEL WITH ASSISTANCE Description: STG Manage Bowel with mod I Assistance. Outcome: Progressing Goal: RH STG MANAGE BOWEL W/EQUIPMENT W/ASSISTANCE Description: STG Manage Bowel With Equipment With Assistance Outcome: Progressing   Problem: SCI BLADDER ELIMINATION Goal: RH STG MANAGE BLADDER WITH ASSISTANCE Description: STG Manage Bladder With mod I Assistance Outcome: Progressing Goal: RH OTHER STG BLADDER ELIMINATION GOALS W/ASSIST Description: Other STG Bladder Elimination Goals With mod I Assistance Outcome: Progressing   Problem: RH SKIN INTEGRITY Goal: RH STG SKIN FREE OF INFECTION/BREAKDOWN Description: Patient will not acquire any skin break down while on IP rehab Outcome: Progressing Goal: RH STG MAINTAIN SKIN INTEGRITY WITH ASSISTANCE Description: STG Maintain Skin Integrity With mod I Assistance. Outcome: Progressing Goal: RH STG ABLE TO PERFORM INCISION/WOUND CARE W/ASSISTANCE Description: STG Able To Perform Incision/Wound Care With mod I Assistance. Outcome: Progressing   Problem: RH SAFETY Goal: RH STG ADHERE TO SAFETY PRECAUTIONS W/ASSISTANCE/DEVICE Description: STG Adhere to Safety Precautions With mod I Assistance/Device. Outcome: Progressing   Problem: RH PAIN MANAGEMENT Goal: RH STG PAIN MANAGED AT OR BELOW PT'S PAIN GOAL Description: Pain scale <4/10 Outcome: Progressing   Problem: RH KNOWLEDGE DEFICIT SCI Goal: RH STG INCREASE KNOWLEDGE OF SELF CARE AFTER SCI Description: Patient will be able to read back instructions on self care,  his condition & preventative measures by discharge Outcome: Progressing

## 2019-07-21 NOTE — Progress Notes (Signed)
[place] PHYSICAL MEDICINE & REHABILITATION PROGRESS NOTE   Subjective/Complaints:  Pt reports had BM Saturday- cleared out- doesn't feel constipated still.  Nerve pain "pretty bad" -feels like arms on fire still- no worse, but not better.     ROS:  Pt denies SOB, abd pain, CP, N/V/C/D, and vision changes   Objective:   No results found. Recent Labs    07/21/19 0533  WBC 4.6  HGB 10.9*  HCT 33.2*  PLT 384   Recent Labs    07/21/19 0533  NA 139  K 3.6  CL 103  CO2 28  GLUCOSE 109*  BUN 10  CREATININE 0.80  CALCIUM 9.1    Intake/Output Summary (Last 24 hours) at 07/21/2019 1017 Last data filed at 07/21/2019 5102 Gross per 24 hour  Intake 700 ml  Output 550 ml  Net 150 ml     Physical Exam: Vital Signs Blood pressure (!) 139/99, pulse 84, temperature 98.2 F (36.8 C), temperature source Oral, resp. rate 18, height 5\' 11"  (1.803 m), weight 92 kg, SpO2 97 %. Constitutional: awake, alert, sleepy, sitting up in bed; hands greased up, NAD HENT: conjugate gaze Cardiovascular: RRR Respiratory: CTA b/L GI: soft, NT, ND< (+)BS Skin: Warm and dry.  Intact. Psych: sleepy; appropriate. Musc: No edema in extremities.hands greased up with lidocaine- no swelling of extremities Neuro: Alert Motor: Bilateral upper extremities: 4 -/5 proximal distal, unchanged Bilateral lower extremities: 4/5 proximally, 3+ distally   Assessment/Plan: 1. 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  Physiatrist is providing close team supervision and 24 hour management of active medical problems listed below.  Physiatrist and rehab team continue to assess barriers to discharge/monitor patient progress toward functional and medical goals  Care Tool:  Bathing    Body parts bathed by patient: Right arm, Left arm, Chest,  Abdomen, Front perineal area, Right upper leg, Left upper leg, Face   Body parts bathed by helper: Buttocks, Left lower leg, Right lower leg     Bathing assist Assist Level: Moderate Assistance - Patient 50 - 74%     Upper Body Dressing/Undressing Upper body dressing   What is the patient wearing?: Pull over shirt    Upper body assist Assist Level: Set up assist    Lower Body Dressing/Undressing Lower body dressing      What is the patient wearing?: Pants     Lower body assist Assist for lower body dressing: Moderate Assistance - Patient 50 - 74%     Toileting Toileting    Toileting assist Assist for toileting: Maximal Assistance - Patient 25 - 49%     Transfers Chair/bed transfer  Transfers assist     Chair/bed transfer assist level: Moderate Assistance - Patient 50 - 74%     Locomotion Ambulation   Ambulation assist   Ambulation activity did not occur: Safety/medical concerns  Assist level: 2 helpers Assistive device: Lite Gait Max distance: 4'   Walk 10 feet activity   Assist  Walk 10 feet activity did not occur: Safety/medical concerns        Walk 50 feet activity   Assist Walk 50 feet with 2 turns activity did not occur: Safety/medical concerns         Walk 150 feet activity   Assist Walk 150 feet activity did not occur: Safety/medical concerns         Walk  10 feet on uneven surface  activity   Assist Walk 10 feet on uneven surfaces activity did not occur: Safety/medical concerns         Wheelchair     Assist Will patient use wheelchair at discharge?: Yes Type of Wheelchair: Manual    Wheelchair assist level: Supervision/Verbal cueing Max wheelchair distance: 100'    Wheelchair 50 feet with 2 turns activity    Assist        Assist Level: Supervision/Verbal cueing   Wheelchair 150 feet activity     Assist      Assist Level: Supervision/Verbal cueing   Blood pressure (!) 139/99, pulse 84,  temperature 98.2 F (36.8 C), temperature source Oral, resp. rate 18, height 5\' 11"  (1.803 m), weight 92 kg, SpO2 97 %.  Medical Problem List and Plan: 1.  Impaired mobility and ADLs secondary to Guillain Barre Syndrome with miller Fischer Variant.   Continue CIR 2.  Antithrombotics: -DVT/anticoagulation:  Pharmaceutical: Heparin  4/20- changed to lovenox- renal function OK             -antiplatelet therapy: N/a 3. Pain Management: Low back pain + neuropathic pain  Tylenol prn.  lyrica to 100mg  at noc, increased to twice daily on 5/2  Continue lidocaine patches and ointment  Increased duloxetine to 60 mg QHS- for nerve pain  5/3- Nerve pain still a big issue- lyrica increased-  4. Mood: LCSW to follow for evaluation and support.              -antipsychotic agents: N/a 5. Neuropsych: This patient is capable of making decisions on his own behalf. 6. Skin/Wound Care: Routine pressure relief measures.  7. Fluids/Electrolytes/Nutrition: Monitor I/Os.  8. HTN: Monitor BP tid--continue Hydralazine, metoprolol. Has been labile.   DC'd amlodipine  Vitals:   07/20/19 2123 07/21/19 0511  BP: (!) 138/101 (!) 139/99  Pulse:  84  Resp:  18  Temp:  98.2 F (36.8 C)  SpO2:  97%   Labile with diastolic elevation on 5/2, monitor trend  5/3- BP running 130s-140s/90s- will d/w team 9. Hyponatremia: Has received Tolvaptan, fluid restriction, and salt tablets.   4/29- on 1500 cc fluid restriction  Sodium 132 on 4/26, labs ordered for tomorrow  5/3- Na 139- will stop sodium tablets 1 mg BID and monitor 10. H/o alcohol use: Continue Thiamine and Folic acid. boderline low Mg 1.7-1.8 range.  Added supplement.  4/22- had pt see Neuropsychology.   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   Hemoglobin 10.8 on 4/26  Continue to monitor 12. Abnormal LFTs:   Elevated on 4/20, labs ordered for tomorrow 13. Bladder incontinence/neurogenic bladder:   Improving 14.  Dysarthria:   Stable 15. Insomnia: Continue Ambien prn  16. Neurogenic bowel/constipation- no control  Ordered Mg citrate and add Senokot 1 tab BID on 4/30  Improving  5/3- LBM Saturday but doesn't feel like needs to go- will discuss tomorrow   LOS: 14 days A FACE TO FACE EVALUATION WAS PERFORMED  Jose Corvin 07/21/2019, 9:28 AM

## 2019-07-21 NOTE — Progress Notes (Signed)
Occupational Therapy Session Note  Patient Details  Name: Jorge Weaver. MRN: [id number] Date of Birth: [birth year]  Today's Date: 07/21/2019 OT Individual Time: 1030-1145 OT Individual Time Calculation (min): 75 min    Short Term Goals: Week 3:  OT Short Term Goal 1 (Week 3): STG=LTG secondary to ELOS  Skilled Therapeutic Interventions/Progress Updates:    Pt resting in w/c upon arrival with wife present.  OT intervention with focus on functional transfers, sit<>stand, standing balance, BADL retraining including bathing at shower level and dressing with sit<>stand from w/c at ink, activity tolerance, and safety awareness to increase independence with BADLs. Squat pivot transfer w/c>TTB with max A and pt with minimal clearance with squat. Bathing with min A using lateral leans to bathe buttocks.  Pt noted with improved BUE ataxia but still hinders ability to complete tasks within a reasonable amount of time. Sit<>stand in shower with min A and stand pivot transfer to w/c with min A. Sit<>stand at sink with min A but pt hyperextends B knees when standing and relies on BUE for balance.  Pt unable to pull pants over hips but is able to thread pants. Pt is able to don slip on shoes independently. Pt transitioned to gym and engaged is sit<>stand and standing tasks/activities.  Pt performed half squats while standing at table (3X10) but again relies on BUE strength and assistance. Pt returned to room and remained in w/c with all needs within reach and belt alarm activated. Wife present.   Therapy Documentation Precautions:  Precautions Precautions: Fall Precaution Comments: ataxia Restrictions Weight Bearing Restrictions: No  Pain:  Pt c/o ongoing pain and increased sensitivity in BUE (hands)-unrated; emotional support, MD aware   Therapy/Group: Individual Therapy  [name] 07/21/2019, 12:12 PM

## 2019-07-21 NOTE — Progress Notes (Signed)
Physical Therapy Session Note  Patient Details  Name: Jorge Weaver. MRN: [id number] Date of Birth: 26-Oct-1965  Today's Date: 07/21/2019 PT Individual Time: 1415-1530 PT Individual Time Calculation (min): 75 min   Short Term Goals: Week 2:  PT Short Term Goal 1 (Week 2): Pt will perform least restrictive transfer with min A consistently PT Short Term Goal 2 (Week 2): Pt will initiate gait training as safe and able PT Short Term Goal 3 (Week 2): Pt will maintain dynamic sitting balance with min A  Skilled Therapeutic Interventions/Progress Updates:    Pt received seated in w/c in room, agreeable to PT session. Pt more alert and engaged this session. Pt's wife present and able to observe some of therapy session. Education with patient and his wife about previous lethargy and low BP limiting progress in therapy but pt appears to be doing much better this date and able to participate more. Pt also exhibits improved core control and ability to stand during session for functional gait training. Dependent transport via w/c to/from therapy gym for time conservation. Squat pivot transfer w/c to mat table with min A. Lateral leans L/R with min A for placement of maxi sky sling. Sit to stand with mod A to RW while in maxi sky sling. Once in standing mod cueing for hip extension and upright trunk in standing. Pt also locks out B knees into extension, able to flex knees with cueing. Ambulation 3 x 5 ft forwards with RW and min to mod A while in maxi sky sling, some assist needed to advance RW and cues for gait pattern. Pt continues to exhibit flexed hips during gait, increases with onset of fatigue. Pt is able to complete 2 turns in standing with RW while in maxi sky with assist for RW management. Pt's LE fatigue quickly during gait and he has several instances of BLE buckling, unable to recover and return to full stance but is caught by maxi sky and pt able to be lowered into w/c or onto mat table. Pt also  exhibits ongoing ataxia in all limbs L>R. Pt reports a "burning" or "fire" sensation in all extremities as well, increases in hands when gripping RW. Seated balance EOM performing ball toss reaching outside BOS to catch ball, min A for balance while a 2nd person tosses ball to patient. Seated ball kick with close SBA for sitting balance with use of alt LE to return ball to therapist. Progress to seated ball toss and/or kick with close SBA for balance. Squat pivot transfer back to w/c then back to bed with min A. Sit to supine Supervision. Pt left seated in bed with needs in reach, bed alarm in place at end of session.  Therapy Documentation Precautions:  Precautions Precautions: Fall Precaution Comments: ataxia Restrictions Weight Bearing Restrictions: No    Therapy/Group: Individual Therapy   Peter Congo, PT, DPT  07/21/2019, 3:48 PM

## 2019-07-21 NOTE — Progress Notes (Signed)
Occupational Therapy Weekly Progress Note  Patient Details  Name: Jorge Weaver. MRN: [id number] Date of Birth: 1965/09/05  Beginning of progress report period: July 14, 2019 End of progress report period: Jul 21, 2019  Patient has met 2 of 4 short term goals.  Pt progress has been inconsistent over the past week.  Pt experiencing ongoing lethargy and BUE (hand) pain/numbness.  Pt has progress with transfers and sit<>stand and currently requires mod A for functional transfers and sit<>stand for functional tasks.  Pt requires min/mod A for bathing at shower level and mod A for LB dressing tasks with sit<>stand from EOB. Pt currently requires max A for toileting tasks.    Patient continues to demonstrate the following deficits: muscle weakness and muscle paralysis, decreased cardiorespiratoy endurance, unbalanced muscle activation and decreased coordination and decreased standing balance and therefore will continue to benefit from skilled OT intervention to enhance overall performance with BADL.  Patient progressing toward long term goals..  Continue plan of care.  OT Short Term Goals Week 2:  OT Short Term Goal 1 (Week 2): Pt will complete toilet transfer with mod A OT Short Term Goal 1 - Progress (Week 2): Met OT Short Term Goal 2 (Week 2): Pt will complete sit<>stand at the sink with mod A in preparation for BADL: tasks OT Short Term Goal 2 - Progress (Week 2): Met OT Short Term Goal 3 (Week 2): Pt will complete LB dressing with min A OT Short Term Goal 3 - Progress (Week 2): Progressing toward goal OT Short Term Goal 4 (Week 2): Pt will complete toileting tasks with mod A OT Short Term Goal 4 - Progress (Week 2): Progressing toward goal Week 3:  OT Short Term Goal 1 (Week 3): STG=LTG secondary to ELOS    Leroy Libman 07/21/2019, 6:34 AM

## 2019-07-21 NOTE — Progress Notes (Signed)
Physical Therapy Session Note  Patient Details  Name: Jorge Weaver. MRN: [id number] Date of Birth: 30-Jun-1965  Today's Date: 07/21/2019 PT Individual Time: 1300-1330 PT Individual Time Calculation (min): 30 min   Short Term Goals: Week 1:  PT Short Term Goal 1 (Week 1): Pt will complete least restrictive transfer with mod A PT Short Term Goal 1 - Progress (Week 1): Met PT Short Term Goal 2 (Week 1): Pt will initiate gait training as safe and able PT Short Term Goal 2 - Progress (Week 1): Progressing toward goal PT Short Term Goal 3 (Week 1): Pt will tolerate sitting up out of bed x 1 hour PT Short Term Goal 3 - Progress (Week 1): Met Week 2:  PT Short Term Goal 1 (Week 2): Pt will perform least restrictive transfer with min A consistently PT Short Term Goal 2 (Week 2): Pt will initiate gait training as safe and able PT Short Term Goal 3 (Week 2): Pt will maintain dynamic sitting balance with min A  Skilled Therapeutic Interventions/Progress Updates:   Session focused on NMR to address postural control re-training, sit <> stands, and standing balance/coordination. In parallel bars, mod assist for sit <> stands x 3-4 reps with facilitation for anterior weightshift and cues for technique. In standing, pt with strong knee hyperextension and cues for adjusting foot placement for wider BOS and limits of stability. Pt able to take a few steps forwards and backwards with focus on coordination during standing trials about 2 steps each time. Due to not having a w/c follow for safety, did not do further each trial. 2" step with alternating toe taps x 2 reps each side x 2 reps with seated break. 1 episode of knee buckling on L requiring total assist to descent to the w/c.  Wife present and educated on pt's current progress and gains in therapy. She was asking about steps. Discussed that currently we are not at a safe point to attempt stairs without lift equipment for safety due to decreased standing  tolerance and LE strength but working towards increasing this. Pt and wife verbalized understanding.   Therapy Documentation Precautions:  Precautions Precautions: Fall Precaution Comments: ataxia Restrictions Weight Bearing Restrictions: No  Pain:  No complaints of pain.    Therapy/Group: Individual Therapy  Canary Brim Ivory Broad, PT, DPT, CBIS  07/21/2019, 2:06 PM

## 2019-07-22 ENCOUNTER — Inpatient Hospital Stay (HOSPITAL_COMMUNITY): Payer: BC Managed Care – PPO | Admitting: Physical Therapy

## 2019-07-22 ENCOUNTER — Inpatient Hospital Stay (HOSPITAL_COMMUNITY): Payer: BC Managed Care – PPO

## 2019-07-22 MED ORDER — SENNA 8.6 MG PO TABS
2.0000 | ORAL_TABLET | Freq: Two times a day (BID) | ORAL | Status: DC
  Administered 2019-07-22 – 2019-07-30 (×16): 17.2 mg via ORAL
  Filled 2019-07-22 (×16): qty 2

## 2019-07-22 MED ORDER — AMLODIPINE BESYLATE 2.5 MG PO TABS: 2.5000 mg | ORAL_TABLET | Freq: Every day | ORAL | Status: DC

## 2019-07-22 MED ORDER — DULOXETINE HCL 60 MG PO CPEP
60.0000 mg | ORAL_CAPSULE | Freq: Every day | ORAL | Status: DC
  Administered 2019-07-22 – 2019-07-24 (×3): 60 mg via ORAL
  Filled 2019-07-22 (×3): qty 1

## 2019-07-22 MED ORDER — SORBITOL 70 % SOLN
30.0000 mL | Freq: Once | Status: AC
  Administered 2019-07-22: 30 mL via ORAL
  Filled 2019-07-22: qty 30

## 2019-07-22 NOTE — Plan of Care (Signed)
  Problem: Sit to Stand Goal: LTG:  Patient will perform sit to stand with assistance level (PT) Description: LTG:  Patient will perform sit to stand with assistance level (PT) Flowsheets (Taken 07/22/2019 1543) LTG: PT will perform sit to stand in preparation for functional mobility with assistance level: (downgrade goal due to progress) Minimal Assistance - Patient > 75% Note: Downgrade goal due to slow progress   Problem: RH Ambulation Goal: LTG Patient will ambulate in controlled environment (PT) Description: LTG: Patient will ambulate in a controlled environment, # of feet with assistance (PT). Flowsheets (Taken 07/22/2019 1543) LTG: Pt will ambulate in controlled environ  assist needed:: (downgrade goal due to slow progress) Moderate Assistance - Patient 50 - 74% LTG: Ambulation distance in controlled environment: 10 ft with LRAD Note: Downgrade goal due to slow progress Goal: LTG Patient will ambulate in home environment (PT) Description: LTG: Patient will ambulate in home environment, # of feet with assistance (PT). Flowsheets (Taken 07/22/2019 1543) LTG: Pt will ambulate in home environ  assist needed:: (d/c goal due to slow progress) -- Note: D/c goal due to slow progress   Problem: RH Stairs Goal: LTG Patient will ambulate up and down stairs w/assist (PT) Description: LTG: Patient will ambulate up and down # of stairs with assistance (PT) Flowsheets (Taken 07/22/2019 1543) LTG: Pt will ambulate up/down stairs assist needed:: (d/c goal due to slow progress) -- Note: D/c goal due to slow progress

## 2019-07-22 NOTE — Patient Care Conference (Signed)
Inpatient RehabilitationTeam Conference and Plan of Care Update Date: 07/22/2019   Time: 11:00 AM    Patient Name: Jorge Weaver.      Medical Record Number: [id number]  Date of Birth: 1966-02-09 Sex: Male         Room/Bed: 4W01C/4W01C-01 Payor Info: Payor: [place] / Plan: [employer] COMM PPO / Product Type: *No Product type* /    Admit Date/Time:  07/07/2019  5:13 PM  Primary Diagnosis:  GBS (Guillain-Barre syndrome) ([place])  Patient Active Problem List   Diagnosis Date Noted  . Neuropathic pain   . Neurogenic bowel   . Neurogenic bladder   . Transaminitis   . Acute blood loss anemia   . Hyponatremia   . Labile blood pressure   . GBS (Guillain-Barre syndrome) ([place]) 07/07/2019  . Hypokalemia 06/28/2019  . Weakness 06/27/2019  . Cervical spondylosis with myelopathy and radiculopathy 01/16/2019  . Spinal stenosis of lumbar region 12/24/2018  . Gastroesophageal reflux disease 06/26/2018  . Essential hypertension 06/17/2018  . Fatty liver   . Witnessed apneic spells 03/28/2017  . Prediabetes 01/18/2017  . Bronchitis 05/21/2014  . ETOH abuse 05/21/2014  . Asthma 01/15/2013  . Asthma with exacerbation 01/15/2013  . Generalized anxiety disorder 08/24/2012  . Malaise and fatigue 08/24/2012    Expected Discharge Date: Expected Discharge Date: 08/02/19  Team Members Present: Physician leading conference: Dr. Courtney Heys Care Coodinator Present: Erlene Quan, BSW;Genie Rawad Bochicchio, RN, MSN Nurse Present: Rayne Du, LPN PT Present: Excell Seltzer, PT OT Present: Willeen Cass, OT;Roanna Epley, COTA PPS Coordinator present : Gunnar Fusi, Novella Olive, PT     Current Status/Progress Goal Weekly Team Focus  Bowel/Bladder   Pt is continent bowel/bladder; LBM is 07/19/2019  To remain continent of bowel/bladder.  Answer call lights promptly, assess his need to use the bathroom as needed.   Swallow/Nutrition/ Hydration             ADL's   bathing-min a at  shower level; UB dressing-supervisoin; LB dressing-mod A; functional transfers-mod A squat/stand pivot transfers; toleting-mod A  Supervision  tranfsers, activity tolerance, standing balance, BUE strengthening and coordination, education   Mobility   min A bed mobility, min to mod A squat pivot transfers, mod A to stand to RW, min A w/c mobility x 100 ft, gait in maxi sky  S to min A overall  LE NMR, pre gait and gait training, core strengthening   Communication             Safety/Cognition/ Behavioral Observations            Pain   Pt complains of bilateral hand pain/tingling, 6/10. Had lidocaine 5% ointment and scheduled tramadol.  To help pain level remain below a 2.  Assess pain q shift or as needed.   Skin   Skin is intact  Prevent any skin breakdown from occuring  Assess skin q shift or prn    Rehab Goals Patient on target to meet rehab goals: Yes Rehab Goals Revised: Patient on target with goal *See Care Plan and progress notes for long and short-term goals.     Barriers to Discharge  Current Status/Progress Possible Resolutions Date Resolved   Nursing                  PT  Decreased caregiver support;Medical stability;Home environment access/layout;Incontinence  9 STE (split level home)              OT  SLP                SW Inaccessible home environment spouse reports driveway is located on steep hill. Patient has 1 step/threshold to enter home. To access bedroom patient has 8-9 steps. Patient on target for discharge of 07/29/19          Discharge Planning/Teaching Needs:  Patient and family plan to discharge home  Will schedule education if needed   Team Discussion: MD increased Lyrica for nerve pain, increased senokot, added sorbitol, no BM since last Saturday.  RN miralax given, ?DC Ambien per patient request, per MD will DC Ambien. PT min/mod transfers, standing in light gait machine.  OT min/mod transfers, diaphoretic after shower today.  PT/OT  requested extend DC date to 5/15.   Revisions to Treatment Plan: Extended DC date to 5/15     Medical Summary Current Status: LBM saturday- wants AMbien d/c'd- doing better on trazodone- continent Weekly Focus/Goal: PT- better off ambien; BP better without wrapping legs; minmod squat pivot; maxi sky- starting to take steps  Barriers to Discharge: Weight;Neurogenic Bowel & Bladder;Decreased family/caregiver support;Home enviroment access/layout  Barriers to Discharge Comments: lethargy is improving Possible Resolutions to Barriers: OT- showered - last few days; min-mod assist; could take a step forward this AM; orthostatic this AM   Continued Need for Acute Rehabilitation Level of Care: The patient requires daily medical management by a physician with specialized training in physical medicine and rehabilitation for the following reasons: Direction of a multidisciplinary physical rehabilitation program to maximize functional independence : Yes Medical management of patient stability for increased activity during participation in an intensive rehabilitation regime.: Yes Analysis of laboratory values and/or radiology reports with any subsequent need for medication adjustment and/or medical intervention. : Yes   I attest that I was present, lead the team conference, and concur with the assessment and plan of the team.   Trish Mage 07/22/2019, 8:00 PM   Team conference was held via web/ teleconference due to COVID - 19

## 2019-07-22 NOTE — Progress Notes (Signed)
[place] PHYSICAL MEDICINE & REHABILITATION PROGRESS NOTE   Subjective/Complaints:  Pt reports nerve pain the same. No significant change, but when asked how doing, "can't complain".  NO BM since Saturday.   Needs to have BM.    ROS:   Pt denies SOB, abd pain, CP, N/V/C/D, and vision changes   Objective:   No results found. Recent Labs    07/21/19 0533  WBC 4.6  HGB 10.9*  HCT 33.2*  PLT 384   Recent Labs    07/21/19 0533  NA 139  K 3.6  CL 103  CO2 28  GLUCOSE 109*  BUN 10  CREATININE 0.80  CALCIUM 9.1    Intake/Output Summary (Last 24 hours) at 07/22/2019 0913 Last data filed at 07/22/2019 0514 Gross per 24 hour  Intake 280 ml  Output 300 ml  Net -20 ml     Physical Exam: Vital Signs Blood pressure 122/84, pulse 82, temperature 98.4 F (36.9 C), temperature source Oral, resp. rate 16, height 5\' 11"  (1.803 m), weight 92 kg, SpO2 98 %. Constitutional: awake, alert, appropriate, sitting up; on phone texting, NAD HENT: conjugate gaze; still has facial droop R>L; facial sensation still decreased Cardiovascular: RR Respiratory: CTA B/L GI: Soft, NT, ND, (+)BS  Skin: Warm and dry.  Intact. Psych: more awake, appropriate Musc: No edema in extremities. Neuro: Alert Motor: Bilateral upper extremities: 4 -/5 proximal distal, unchanged Bilateral lower extremities: 4/5 proximally, 3+ distally   Assessment/Plan: 1. 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  Physiatrist is providing close team supervision and 24 hour management of active medical problems listed below.  Physiatrist and rehab team continue to assess barriers to discharge/monitor patient progress toward functional and medical goals  Care Tool:  Bathing    Body parts bathed by patient: Right arm, Left arm, Chest, Abdomen,  Front perineal area, Buttocks, Right upper leg, Left upper leg, Right lower leg, Left lower leg, Face   Body parts bathed by helper: Buttocks, Left lower leg, Right lower leg     Bathing assist Assist Level: Minimal Assistance - Patient > 75%     Upper Body Dressing/Undressing Upper body dressing   What is the patient wearing?: Pull over shirt    Upper body assist Assist Level: Set up assist    Lower Body Dressing/Undressing Lower body dressing      What is the patient wearing?: Incontinence brief, Pants     Lower body assist Assist for lower body dressing: Moderate Assistance - Patient 50 - 74%     Toileting Toileting    Toileting assist Assist for toileting: Maximal Assistance - Patient 25 - 49%     Transfers Chair/bed transfer  Transfers assist     Chair/bed transfer assist level: Minimal Assistance - Patient > 75%     Locomotion Ambulation   Ambulation assist   Ambulation activity did not occur: Safety/medical concerns  Assist level: Moderate Assistance - Patient 50 - 74% Assistive device: Parallel bars Max distance: 2'   Walk 10 feet activity   Assist  Walk 10 feet activity did not occur: Safety/medical concerns        Walk 50 feet activity   Assist Walk 50 feet with 2 turns activity did not occur: Safety/medical concerns         Walk 150 feet activity   Assist Walk 150 feet activity did not  occur: Safety/medical concerns         Walk 10 feet on uneven surface  activity   Assist Walk 10 feet on uneven surfaces activity did not occur: Safety/medical concerns         Wheelchair     Assist Will patient use wheelchair at discharge?: Yes Type of Wheelchair: Manual    Wheelchair assist level: Supervision/Verbal cueing Max wheelchair distance: 100'    Wheelchair 50 feet with 2 turns activity    Assist        Assist Level: Supervision/Verbal cueing   Wheelchair 150 feet activity     Assist       Assist Level: Supervision/Verbal cueing   Blood pressure 122/84, pulse 82, temperature 98.4 F (36.9 C), temperature source Oral, resp. rate 16, height 5\' 11"  (1.803 m), weight 92 kg, SpO2 98 %.  Medical Problem List and Plan: 1.  Impaired mobility and ADLs secondary to Guillain Barre Syndrome with miller Fischer Variant.   Continue CIR 2.  Antithrombotics: -DVT/anticoagulation:  Pharmaceutical: Heparin  4/20- changed to lovenox- renal function OK             -antiplatelet therapy: N/a 3. Pain Management: Low back pain + neuropathic pain  Tylenol prn.  lyrica to 100mg  at noc, increased to twice daily on 5/2  Continue lidocaine patches and ointment  Increased duloxetine to 60 mg QHS- for nerve pain  5/3- Nerve pain still a big issue- lyrica increased-  4. Mood: LCSW to follow for evaluation and support.              -antipsychotic agents: N/a 5. Neuropsych: This patient is capable of making decisions on his own behalf. 6. Skin/Wound Care: Routine pressure relief measures.  7. Fluids/Electrolytes/Nutrition: Monitor I/Os.  8. HTN: Monitor BP tid--continue Hydralazine, metoprolol. Has been labile.   DC'd amlodipine  Vitals:   07/21/19 2003 07/22/19 0507  BP: (!) 136/98 122/84  Pulse: 99 82  Resp: 16 16  Temp: 99 F (37.2 C) 98.4 F (36.9 C)  SpO2: 97% 98%   Labile with diastolic elevation on 5/2, monitor trend  5/3- BP running 130s-140s/90s- will d/w team  5/4- BP 136/98 this AM and 122/84- will restart Norvasc but only 2.5 mg daily and see how it goes.  9. Hyponatremia: Has received Tolvaptan, fluid restriction, and salt tablets.   4/29- on 1500 cc fluid restriction  Sodium 132 on 4/26, labs ordered for tomorrow  5/3- Na 139- will stop sodium tablets 1 mg BID and monitor  5/4- labs in AM 10. H/o alcohol use: Continue Thiamine and Folic acid. boderline low Mg 1.7-1.8 range.  Added supplement.  4/22- had pt see Neuropsychology.   11. ABLA: Likely side effect of  IVIG--continue to monitor with serial checks.   Hemoglobin 10.8 on 4/26  Continue to monitor 12. Abnormal LFTs:   Elevated on 4/20, labs ordered for tomorrow  5/4- LFTs much improved- in normal range now 13. Bladder incontinence/neurogenic bladder:   Improving 14. Dysarthria:   Stable 15. Insomnia: Continue Ambien prn  16. Neurogenic bowel/constipation- no control  Ordered Mg citrate and add Senokot 1 tab BID on 4/30  Improving  5/3- LBM Saturday but doesn't feel like needs to go- will discuss tomorrow   5/4- will order Sorbitol again and increase senokot to 2 tabs BID  LOS: 15 days A FACE TO FACE EVALUATION WAS PERFORMED  Tip Atienza 07/22/2019, 9:13 AM

## 2019-07-22 NOTE — Progress Notes (Signed)
Occupational Therapy Session Note  Patient Details  Name: Jorge Weaver. MRN: [id number] Date of Birth: July 04, 1965  Today's Date: 07/22/2019 OT Individual Time: 0100-0130 OT Individual Time Calculation (min): 30 min    Short Term Goals: Week 3:  OT Short Term Goal 1 (Week 3): STG=LTG secondary to ELOS  Skilled Therapeutic Interventions/Progress Updates:  Pt recived seated in w/c with bil thigh high TED hose donned agreeable to OT intervention. Session focus on functional sit<>stands at sink with pt completing x5 total needing MODA to power into standing. Pt continues to present with BLEs hyperextending in standing needing tactile and verbal cues to shift weight anteriorly. Worked on controlling descent into w/c by providing education on reaching back to arm rests during descent with good carryover.Vitals taken throughout session with slight drop in BP however pt asymptomatic. Pt left seated in w/c with alarm belt activated and all needs within reach.   Therapy Documentation Precautions:  Precautions Precautions: Fall Precaution Comments: ataxia Restrictions Weight Bearing Restrictions: No General:   Vital Signs: Therapy Vitals Temp: 98.3 F (36.8 C) Temp Source: Oral Pulse Rate: 76 Resp: 16 BP: 110/87 Patient Position (if appropriate): Sitting Oxygen Therapy SpO2: 99 % O2 Device: Room Air   Start of session: 107/68 End of session: 97/70 Pain: Pt reports unrated pain in Bil hands and shoulders; utilized repositioning and rest breaks as pain mgmt strategy.   Therapy/Group: Individual Therapy  Angelina Pih 07/22/2019, 2:44 PM

## 2019-07-22 NOTE — Progress Notes (Signed)
Social Work Patient ID: Jorge Glad., male   DOB: 12/10/1965, 54 y.o.   MRN: [id number]  Sw entered patients room, called spouse a bedside. No answer, left voicemail. SW provided patient with team conference update from MD, nursing and therapy. Patient pleased and feels like his medication adjustment has really been beneficial. Agrees to new d/c date of 08/02/19.  Spouse returned called to SW. SW provided same information. Spouse would like update of d/c recommendation. SW inform spouse that information would be provided closer to d/c. SW aware that spouse would like to have a meeting. SW will set meeting up the same of discharge and have family come in for education the same day. Looking to set meeting and education Wednesday May 12th or Thursday May 13th. Sw will follow up with spouse, therapy and neuro.

## 2019-07-22 NOTE — Progress Notes (Signed)
Physical Therapy Weekly Progress Note  Patient Details  Name: Jorge Weaver. MRN: [id number] Date of Birth: 1965-06-16  Beginning of progress report period: July 15, 2019 End of progress report period: Jul 22, 2019  Today's Date: 07/22/2019  Patient has met 3 of 3 short term goals.  Patient has made slow progress over the past week being limited by hypotension and lethargy. Pt has exhibited improvement in these symptoms over the past few days and has been better able to functionally participate. Pt is at min A level for bed mobility, min to mod A for squat pivot transfers, mod A to stand to the RW, Supervision to min A for w/c mobility, and has finally been able to initiate gait training with use of an overhead body weight supported system and the RW.  Patient continues to demonstrate the following deficits muscle weakness, decreased cardiorespiratoy endurance, abnormal tone, unbalanced muscle activation, ataxia and decreased coordination and decreased sitting balance, decreased standing balance, decreased postural control and decreased balance strategies and therefore will continue to benefit from skilled PT intervention to increase functional independence with mobility.  Patient not progressing toward long term goals.  See goal revision..  Plan of care revisions: downgraded sit to stand goal to min A, downgraded gait goal to 10 ft with LRAD in a controlled environment, d/c stair goal due to slow progress and decreased safety.  PT Short Term Goals Week 2:  PT Short Term Goal 1 (Week 2): Pt will perform least restrictive transfer with min A consistently PT Short Term Goal 1 - Progress (Week 2): Met PT Short Term Goal 2 (Week 2): Pt will initiate gait training as safe and able PT Short Term Goal 2 - Progress (Week 2): Met PT Short Term Goal 3 (Week 2): Pt will maintain dynamic sitting balance with min A PT Short Term Goal 3 - Progress (Week 2): Met Week 3:  PT Short Term Goal 1 (Week 3): Pt will  maintain dynamic sitting balance at Supervision level PT Short Term Goal 2 (Week 3): Pt will perform bed mobility at Supervision level consistently PT Short Term Goal 3 (Week 3): Pt will ambulate x 10 ft with LRAD   Therapy Documentation Precautions:  Precautions Precautions: Fall Precaution Comments: ataxia Restrictions Weight Bearing Restrictions: No   Therapy/Group: Individual Therapy   Excell Seltzer, PT, DPT  07/22/2019, 7:42 AM

## 2019-07-22 NOTE — Progress Notes (Signed)
Occupational Therapy Session Note  Patient Details  Name: Jorge Griego Jr. MRN: [id number] Date of Birth: 03/17/1966  Today's Date: 07/22/2019 OT Individual Time: 0915-1025 OT Individual Time Calculation (min): 70 min    Short Term Goals: Week 2:  OT Short Term Goal 1 (Week 2): Pt will complete toilet transfer with mod A OT Short Term Goal 1 - Progress (Week 2): Met OT Short Term Goal 2 (Week 2): Pt will complete sit<>stand at the sink with mod A in preparation for BADL: tasks OT Short Term Goal 2 - Progress (Week 2): Met OT Short Term Goal 3 (Week 2): Pt will complete LB dressing with min A OT Short Term Goal 3 - Progress (Week 2): Progressing toward goal OT Short Term Goal 4 (Week 2): Pt will complete toileting tasks with mod A OT Short Term Goal 4 - Progress (Week 2): Progressing toward goal  Skilled Therapeutic Interventions/Progress Updates:    Pt resting in bed upon arrival with daughter present. OT intervention with focus on bed mobility, sit<>stand, standing balance, functional transfers, BADL retraining, activity tolerance, and safety awareness to increase independence with BADLs. Stand pivot transfers with min A and extra time.  Sit<>stand with min A at sink and in shower. Bathing at shower level and dressing with sit<>stand from w/c at sink.  See Care Tool for assist levels.  Pt continues to exhibit B knee extension when standing but able to step forward during dressing tasks to facilitate pulling pants over hips.  Pt became diaphoretic at end of session. BP; sitting with no Teds 95/69, with Teds 81/59. Stand pivot transfer back to bed and BP 94/68. Pt with no c/o dizziness. Pt remained in bed with all needs within reach and bed alarm activated.  RN Blair notified of pt's status.   Therapy Documentation Precautions:  Precautions Precautions: Fall Precaution Comments: ataxia Restrictions Weight Bearing Restrictions: No Pain: Pain Assessment Pain Scale: 0-10 Pain Score: 4   Pain Type: Acute pain Pain Location: Hand Pain Orientation: Left;Right Pain Descriptors / Indicators: Tingling Pain Frequency: Intermittent Pain Onset: On-going Patients Stated Pain Goal: 0 Pain Intervention(s):RN aware and meds admin prior to therapy  Therapy/Group: Individual Therapy  Lanier, Thomas Chappell 07/22/2019, 10:30 AM  

## 2019-07-22 NOTE — Progress Notes (Signed)
Social Work Patient ID: Jorge Glad., male   DOB: [birth year], 54 y.o.   MRN: [id number]  Family education set up with spouse. Scheduled for Thursday, May 13th 8AM-12PM.

## 2019-07-22 NOTE — Plan of Care (Signed)
  Problem: Consults Goal: RH SPINAL CORD INJURY PATIENT EDUCATION Description:  See Patient Education module for education specifics.  Outcome: Progressing Goal: Skin Care Protocol Initiated - if Braden Score 18 or less Description: If consults are not indicated, leave blank or document N/A Outcome: Progressing Goal: Nutrition Consult-if indicated Outcome: Progressing   Problem: SCI BOWEL ELIMINATION Goal: RH STG MANAGE BOWEL WITH ASSISTANCE Description: STG Manage Bowel with mod I Assistance. Outcome: Progressing Goal: RH STG MANAGE BOWEL W/EQUIPMENT W/ASSISTANCE Description: STG Manage Bowel With Equipment With Assistance Outcome: Progressing   Problem: SCI BLADDER ELIMINATION Goal: RH STG MANAGE BLADDER WITH ASSISTANCE Description: STG Manage Bladder With mod I Assistance Outcome: Progressing Goal: RH OTHER STG BLADDER ELIMINATION GOALS W/ASSIST Description: Other STG Bladder Elimination Goals With mod I Assistance Outcome: Progressing   Problem: RH SKIN INTEGRITY Goal: RH STG SKIN FREE OF INFECTION/BREAKDOWN Description: Patient will not acquire any skin break down while on IP rehab Outcome: Progressing Goal: RH STG MAINTAIN SKIN INTEGRITY WITH ASSISTANCE Description: STG Maintain Skin Integrity With mod I Assistance. Outcome: Progressing Goal: RH STG ABLE TO PERFORM INCISION/WOUND CARE W/ASSISTANCE Description: STG Able To Perform Incision/Wound Care With mod I Assistance. Outcome: Progressing   Problem: RH SAFETY Goal: RH STG ADHERE TO SAFETY PRECAUTIONS W/ASSISTANCE/DEVICE Description: STG Adhere to Safety Precautions With mod I Assistance/Device. Outcome: Progressing   Problem: RH PAIN MANAGEMENT Goal: RH STG PAIN MANAGED AT OR BELOW PT'S PAIN GOAL Description: Pain scale <4/10 Outcome: Progressing   Problem: RH KNOWLEDGE DEFICIT SCI Goal: RH STG INCREASE KNOWLEDGE OF SELF CARE AFTER SCI Description: Patient will be able to read back instructions on self care,  his condition & preventative measures by discharge Outcome: Progressing   

## 2019-07-22 NOTE — Progress Notes (Signed)
Physical Therapy Session Note  Patient Details  Name: Jorge Weaver. MRN: [id number] Date of Birth: Aug 22, 1965  Today's Date: 07/22/2019 PT Individual Time: 1125-1200; [id number] PT Individual Time Calculation (min): 35 min and 70 min  Short Term Goals: Week 2:  PT Short Term Goal 1 (Week 2): Pt will perform least restrictive transfer with min A consistently PT Short Term Goal 2 (Week 2): Pt will initiate gait training as safe and able PT Short Term Goal 3 (Week 2): Pt will maintain dynamic sitting balance with min A  Skilled Therapeutic Interventions/Progress Updates:    Session 1: Pt received seated in bed, agreeable to PT session. Pt reports ongoing nerve pain in B hands, not rated. Semi-reclined BP in bed with use of thigh-high TEDs 114/88. Semi-reclined to sitting EOB with min A and use of bedrails, HOB slightly elevated. Seated BP 130/95. Pt remains asymptomatic throughout session. Attempt stand pivot transfer to w/c, pt with heavy lean to the L in standing and unable to complete transfer safely, returned to sitting. Squat pivot transfers performed for remainder of session with pt requiring min A. Manual w/c propulsion x 150 ft with use of BUE at Supervision level. Reviewed management of w/c parts with mod cueing. Seated balance EOM with close SBA to min A overall for sitting balance. Pt continues to exhibit L lateral lean with onset of fatigue. Seated L/R lateral leans x 10 reps each direction with min A to return to upright position. Seated chest press with therapy ball x 10 reps with min A to maintain sitting balance without UE support with onset of lateral lean to the L with onset of fatigue. Pt agreeable to remain seated in w/c at end of session for lunch, needs in reach, quick release belt and chair alarm in place. RN in room to apply lidocaine patches to low back for pain management at end of session.  Session 2: Pt received seated in w/c in room, agreeable to PT session. No complaints  of pain. Dependent transport via w/c to/from therapy gym for time conservation. Squat pivot transfer to mat table with min A. Session focus on BLE in standing with use of LiteGait for increased safety. Pt is able to perform lateral and anterior leans while seated on mat table for donning of LiteGait sling. Once in standing pt exhibits trunk and hip flexion, able to correct with cueing and use of mirror for visual feedback. Standing lateral weight shift L/R with assist for L knee extension. Pt exhibits decreased ability to lock L knee in extension with onset of fatigue. Standing alt L/R marches x 5 reps before unable to hold weight on LLE, returned to sitting. Sit to stand with no body weight supported system with mod A to RW x 3 reps. Once in standing pt continues to exhibit anterior lean and flexed trunk and hips. With visual cueing from mirror and mod manual and verbal cues from therapist pt able to achieve upright posture. Pt reports he feels that he is falling posteriorly when actually just standing upright, decreased proprioception in limbs and trunk. Pt requests to return to bed at end of session, squat pivot transfer with min A. Sit to supine with min A. Pt left seated in bed with needs in reach, bed alarm in place at end of session. Also provided pt with home measurement sheet to have family complete.  Therapy Documentation Precautions:  Precautions Precautions: Fall Precaution Comments: ataxia Restrictions Weight Bearing Restrictions: No    Therapy/Group: Individual Therapy  Peter Congo, PT, DPT  07/22/2019, 12:28 PM

## 2019-07-23 ENCOUNTER — Inpatient Hospital Stay (HOSPITAL_COMMUNITY): Payer: BC Managed Care – PPO | Admitting: Physical Therapy

## 2019-07-23 ENCOUNTER — Inpatient Hospital Stay (HOSPITAL_COMMUNITY): Payer: BC Managed Care – PPO

## 2019-07-23 MED ORDER — PREGABALIN 75 MG PO CAPS
150.0000 mg | ORAL_CAPSULE | Freq: Three times a day (TID) | ORAL | Status: DC
  Administered 2019-07-23 – 2019-07-27 (×12): 150 mg via ORAL
  Filled 2019-07-23 (×12): qty 2

## 2019-07-23 MED ORDER — MAGNESIUM CITRATE PO SOLN
1.0000 | Freq: Once | ORAL | Status: AC
  Administered 2019-07-23: 1 via ORAL
  Filled 2019-07-23: qty 296

## 2019-07-23 NOTE — Progress Notes (Signed)
Physical Therapy Session Note  Patient Details  Name: Jorge Weaver. MRN: [id number] Date of Birth: 1965-07-03  Today's Date: 07/23/2019 PT Individual Time: 1100-1130; 1415-1540 PT Individual Time Calculation (min): 30 min and 85 min PT Missed Time: 30 min Missed Time Reason: fatigue  Short Term Goals: Week 3:  PT Short Term Goal 1 (Week 3): Pt will maintain dynamic sitting balance at Supervision level PT Short Term Goal 2 (Week 3): Pt will perform bed mobility at Supervision level consistently PT Short Term Goal 3 (Week 3): Pt will ambulate x 10 ft with LRAD  Skilled Therapeutic Interventions/Progress Updates:    Session 1: Pt received semi-reclined in bed, appears very lethargic this AM and reports his BP has been low. After discussion with primary OT he reported pt was orthostatic with him during session this AM as well. Semi-reclined BP 92/64 while wearing thigh-high TEDs. Assisted pt with donning BLE ACE wraps. Semi-reclined BP 100/71. Semi-reclined to sitting EOB with mod A for BLE management. Once seated EOB pt does report feeling "woozy". Seated BP 88/74, assist pt back to supine with mod A for BLE management. Supine BP 109/95. Pt falling asleep once in supine, unable to further participate in therapy session at a functional level. RN notified of ongoing lethargy and BP. Pt left supine in bed with needs in reach, bed alarm in place. Pt missed 30 min of scheduled therapy session due to fatigue.  Session 2: Pt received supine in bed asleep, arousable and agreeable to PT session. No complaints of pain. Pt remains lethargic throughout session and reports feeling "loopy" but motivated to participate as he is able. Supine BP 111/88 with thigh-high TEDs and ACE wrap. Supine to sit with min A. Seated BP 120/90 but pt with poor sitting balance, requires min to mod A to maintain balance with lateral lean to the L and anterior lean. Sit to supine mod A for BLE management. Session focus on BLE  strengthening via therex at bed level: resisted PF 2 x 10 reps with green theraband, heel slides, SKFO, SAQ, hip flex, hip abd, bridges x 10 reps each with AAROM for LLE. Pt reports urge to use the bathroom at end of session. Supine to sit min A. Sitting balance improved to close SBA. Sit to stand with min A to stedy. Stedy transfer to toilet. Pt is able to continently void on elevated BSC over toilet, see Flowsheet for details. Sit to stand with min A to stedy, dependent for pericare and clothing management. Stedy transfer back to bed. Sit to supine mod A for BLE management. Pt left semi-reclined in bed with needs in reach, bed alarm in place at end of session.   Therapy Documentation Precautions:  Precautions Precautions: Fall Precaution Comments: ataxia Restrictions Weight Bearing Restrictions: No    Therapy/Group: Individual Therapy   Peter Congo, PT, DPT  07/23/2019, 11:32 AM

## 2019-07-23 NOTE — Progress Notes (Signed)
[place] PHYSICAL MEDICINE & REHABILITATION PROGRESS NOTE   Subjective/Complaints:  Still no BM since Saturday- didn't go with sorbitol- needs "big guns".   Also, nerve pain not getting better in hands B/L- hurts to push w/c even.  Slept well- woke up ~6am  -ate breakfast- no sleepiness since d/c'd ambien.    ROS:   Pt denies SOB, abd pain, CP, N/V/C/D, and vision changes  Objective:   No results found. Recent Labs    07/21/19 0533  WBC 4.6  HGB 10.9*  HCT 33.2*  PLT 384   Recent Labs    07/21/19 0533  NA 139  K 3.6  CL 103  CO2 28  GLUCOSE 109*  BUN 10  CREATININE 0.80  CALCIUM 9.1    Intake/Output Summary (Last 24 hours) at 07/23/2019 5732 Last data filed at 07/23/2019 0528 Gross per 24 hour  Intake 220 ml  Output 450 ml  Net -230 ml     Physical Exam: Vital Signs Blood pressure (!) 148/100, pulse 88, temperature 98.4 F (36.9 C), temperature source Oral, resp. rate 18, height 5\' 11"  (1.803 m), weight 92 kg, SpO2 94 %. Constitutional: awake, sitting up- finished breakfast,  HENT: conjugate gaze; still has facial droop R>L; facial sensation still decreased-chronic- unchanged Cardiovascular: RRR Respiratory: CTA B/L GI: Soft, NT, ND, (+)BS  Skin: Warm and dry.  Intact. Psych: more awake, appropriate Musc: No edema in extremities.TTP over hands- the actual skin is TTP Neuro: Alert Motor: Bilateral upper extremities: 4 -/5 proximal distal, unchanged Bilateral lower extremities: 4/5 proximally, 3+ distally   Assessment/Plan: 1. 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  Physiatrist is providing close team supervision and 24 hour management of active medical problems listed below.  Physiatrist and rehab team continue to assess barriers to discharge/monitor patient progress toward  functional and medical goals  Care Tool:  Bathing    Body parts bathed by patient: Right arm, Left arm, Chest, Abdomen, Front perineal area, Buttocks, Right upper leg, Left upper leg, Right lower leg, Left lower leg, Face   Body parts bathed by helper: Buttocks, Left lower leg, Right lower leg     Bathing assist Assist Level: Minimal Assistance - Patient > 75%     Upper Body Dressing/Undressing Upper body dressing   What is the patient wearing?: Pull over shirt    Upper body assist Assist Level: Set up assist    Lower Body Dressing/Undressing Lower body dressing      What is the patient wearing?: Incontinence brief, Pants     Lower body assist Assist for lower body dressing: Moderate Assistance - Patient 50 - 74%     Toileting Toileting    Toileting assist Assist for toileting: Maximal Assistance - Patient 25 - 49%     Transfers Chair/bed transfer  Transfers assist     Chair/bed transfer assist level: Minimal Assistance - Patient > 75%     Locomotion Ambulation   Ambulation assist   Ambulation activity did not occur: Safety/medical concerns  Assist level: Moderate Assistance - Patient 50 - 74% Assistive device: Parallel bars Max distance: 2'   Walk 10 feet activity   Assist  Walk 10 feet activity did not occur: Safety/medical concerns        Walk 50 feet activity   Assist Walk 50 feet with 2 turns activity did not occur: Safety/medical concerns  Walk 150 feet activity   Assist Walk 150 feet activity did not occur: Safety/medical concerns         Walk 10 feet on uneven surface  activity   Assist Walk 10 feet on uneven surfaces activity did not occur: Safety/medical concerns         Wheelchair     Assist Will patient use wheelchair at discharge?: Yes Type of Wheelchair: Manual    Wheelchair assist level: Supervision/Verbal cueing Max wheelchair distance: 150'    Wheelchair 50 feet with 2 turns  activity    Assist        Assist Level: Supervision/Verbal cueing   Wheelchair 150 feet activity     Assist      Assist Level: Supervision/Verbal cueing   Blood pressure (!) 148/100, pulse 88, temperature 98.4 F (36.9 C), temperature source Oral, resp. rate 18, height 5\' 11"  (1.803 m), weight 92 kg, SpO2 94 %.  Medical Problem List and Plan: 1.  Impaired mobility and ADLs secondary to Guillain Barre Syndrome with miller Fischer Variant.   Continue CIR 2.  Antithrombotics: -DVT/anticoagulation:  Pharmaceutical: Heparin  4/20- changed to lovenox- renal function OK             -antiplatelet therapy: N/a 3. Pain Management: Low back pain + neuropathic pain  Tylenol prn.  lyrica to 100mg  at noc, increased to twice daily on 5/2  Continue lidocaine patches and ointment  Increased duloxetine to 60 mg QHS- for nerve pain  5/3- Nerve pain still a big issue- lyrica increased-   5/5- increased lyrica to 150 mg TID- hopefully will help some 4. Mood: LCSW to follow for evaluation and support.              -antipsychotic agents: N/a 5. Neuropsych: This patient is capable of making decisions on his own behalf. 6. Skin/Wound Care: Routine pressure relief measures.  7. Fluids/Electrolytes/Nutrition: Monitor I/Os.  8. HTN: Monitor BP tid--continue Hydralazine, metoprolol. Has been labile.   DC'd amlodipine  Vitals:   07/22/19 2141 07/23/19 0517  BP: 117/85 (!) 148/100  Pulse:  88  Resp:  18  Temp:  98.4 F (36.9 C)  SpO2:  94%   Labile with diastolic elevation on 5/2, monitor trend  5/3- BP running 130s-140s/90s- will d/w team  5/4- BP 136/98 this AM and 122/84- will restart Norvasc but only 2.5 mg daily and see how it goes.  5/5- stopped norvasc yesterday since has been having orthostatic hypotension - but BP running 148/100 when laying down- in 80s/50s when sitting/standing.   9. Hyponatremia: Has received Tolvaptan, fluid restriction, and salt tablets.   4/29- on 1500 cc  fluid restriction  Sodium 132 on 4/26, labs ordered for tomorrow  5/3- Na 139- will stop sodium tablets 1 mg BID and monitor  5/5- labs in AM to f/u on Sodium- make sure not too low 10. H/o alcohol use: Continue Thiamine and Folic acid. boderline low Mg 1.7-1.8 range.  Added supplement.  4/22- had pt see Neuropsychology.   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   Hemoglobin 10.8 on 4/26  Continue to monitor 12. Abnormal LFTs:   Elevated on 4/20, labs ordered for tomorrow  5/4- LFTs much improved- in normal range now 13. Bladder incontinence/neurogenic bladder:   Improving 14. Dysarthria:   Stable 15. Insomnia: Continue Ambien prn  16. Neurogenic bowel/constipation- no control  Ordered Mg citrate and add Senokot 1 tab BID on 4/30  Improving  5/3- LBM Saturday but doesn't feel like needs to go- will discuss tomorrow   5/4- will order Sorbitol again and increase senokot to 2 tabs BID  5/5- will give Mg citrate - since hasn't gone  LOS: 16 days A FACE TO FACE EVALUATION WAS PERFORMED  Charizma Gardiner 07/23/2019, 8:08 AM

## 2019-07-23 NOTE — Progress Notes (Signed)
Occupational Therapy Session Note  Patient Details  Name: Jorge Weaver. MRN: [id number] Date of Birth: 1965/07/10  Today's Date: 07/23/2019 OT Individual Time: 2263-3354 OT Individual Time Calculation (min): 45 min  and Today's Date: 07/23/2019 OT Missed Time: 30 Minutes Missed Time Reason: Other (comment)(hypotension)   Short Term Goals: Week 3:  OT Short Term Goal 1 (Week 3): STG=LTG secondary to ELOS  Skilled Therapeutic Interventions/Progress Updates:    Pt asleep in bed upon arrival. Pt required mod multimodal cues to arouse and additional 5 mins to reorient and communicate effectively with therapist.  Engaged pt in conversation for a few minutes before moving in bed and planning session with pt.  Pt commented that he slept well during the night but "felt a little drowsy." Discussed bathing options and pt and therapist decided bathing at sink would be best this morning.  Supine>sit EOB with HOB elevated at supervision using bed rails. Stand pivot transfer to w/c with mod A. While sitting in w/c at sink, pt commented that he was feeling "woozy." BP seated with no Teds 85/57. Pt returned to bed-BP supine no Teds 98/76. Thigh high Teds donned and BP 90/69. Pt stated he needed to have a bowel movement.  Bed pan and urinal placed since sitting on BSC unsafe at this time.  Bed alarm activated and call bell within reach.  RN notified of pt's status.  Therapy Documentation Precautions:  Precautions Precautions: Fall Precaution Comments: ataxia Restrictions Weight Bearing Restrictions: No General: General OT Amount of Missed Time: 30 Minutes Vital Signs: Therapy Vitals BP: 90/69(with teds) Patient Position (if appropriate): Lying Pain: Pain Assessment Pain Scale: 0-10 Pain Score: 5  Pain Type: Acute pain Pain Location: Hand Pain Orientation: Right;Left Pain Descriptors / Indicators: Tightness;Tingling Pain Frequency: Constant Pain Onset: On-going Patients Stated Pain Goal: 2 Pain  Intervention(s): Repositioned, emotional support  Therapy/Group: Individual Therapy  [name] 07/23/2019, 10:12 AM

## 2019-07-23 NOTE — Plan of Care (Signed)
  Problem: SCI BLADDER ELIMINATION Goal: RH STG MANAGE BLADDER WITH ASSISTANCE Description: STG Manage Bladder With mod I Assistance Outcome: Progressing Goal: RH OTHER STG BLADDER ELIMINATION GOALS W/ASSIST Description: Other STG Bladder Elimination Goals With mod I Assistance Outcome: Progressing   Problem: RH SKIN INTEGRITY Goal: RH STG SKIN FREE OF INFECTION/BREAKDOWN Description: Patient will not acquire any skin break down while on IP rehab Outcome: Progressing Goal: RH STG MAINTAIN SKIN INTEGRITY WITH ASSISTANCE Description: STG Maintain Skin Integrity With mod I Assistance. Outcome: Progressing   Problem: RH SAFETY Goal: RH STG ADHERE TO SAFETY PRECAUTIONS W/ASSISTANCE/DEVICE Description: STG Adhere to Safety Precautions With mod I Assistance/Device. Outcome: Progressing   Problem: RH PAIN MANAGEMENT Goal: RH STG PAIN MANAGED AT OR BELOW PT'S PAIN GOAL Description: Pain scale <4/10 Outcome: Progressing

## 2019-07-24 ENCOUNTER — Inpatient Hospital Stay (HOSPITAL_COMMUNITY): Payer: BC Managed Care – PPO | Admitting: Physical Therapy

## 2019-07-24 ENCOUNTER — Inpatient Hospital Stay (HOSPITAL_COMMUNITY): Payer: BC Managed Care – PPO | Admitting: *Deleted

## 2019-07-24 ENCOUNTER — Inpatient Hospital Stay (HOSPITAL_COMMUNITY): Payer: BC Managed Care – PPO

## 2019-07-24 LAB — CBC WITH DIFFERENTIAL/PLATELET
Abs Immature Granulocytes: 0.01 10*3/uL (ref 0.00–0.07)
Basophils Absolute: 0 10*3/uL (ref 0.0–0.1)
Basophils Relative: 1 %
Eosinophils Absolute: 0.1 10*3/uL (ref 0.0–0.5)
Eosinophils Relative: 2 %
HCT: 34.7 % — ABNORMAL LOW (ref 39.0–52.0)
Hemoglobin: 10.8 g/dL — ABNORMAL LOW (ref 13.0–17.0)
Immature Granulocytes: 0 %
Lymphocytes Relative: 31 %
Lymphs Abs: 1.6 10*3/uL (ref 0.7–4.0)
MCH: 32 pg (ref 26.0–34.0)
MCHC: 31.1 g/dL (ref 30.0–36.0)
MCV: 103 fL — ABNORMAL HIGH (ref 80.0–100.0)
Monocytes Absolute: 0.5 10*3/uL (ref 0.1–1.0)
Monocytes Relative: 9 %
Neutro Abs: 3 10*3/uL (ref 1.7–7.7)
Neutrophils Relative %: 57 %
Platelets: 305 10*3/uL (ref 150–400)
RBC: 3.37 MIL/uL — ABNORMAL LOW (ref 4.22–5.81)
RDW: 14.4 % (ref 11.5–15.5)
WBC: 5.1 10*3/uL (ref 4.0–10.5)
nRBC: 0 % (ref 0.0–0.2)

## 2019-07-24 LAB — BASIC METABOLIC PANEL
Anion gap: 6 (ref 5–15)
BUN: 9 mg/dL (ref 6–20)
CO2: 29 mmol/L (ref 22–32)
Calcium: 9.1 mg/dL (ref 8.9–10.3)
Chloride: 105 mmol/L (ref 98–111)
Creatinine, Ser: 1.01 mg/dL (ref 0.61–1.24)
GFR calc Af Amer: >60 mL/min (ref >60)
GFR calc non Af Amer: >60 mL/min (ref >60)
Glucose, Bld: 132 mg/dL — ABNORMAL HIGH (ref 70–99)
Potassium: 4 mmol/L (ref 3.5–5.1)
Sodium: 140 mmol/L (ref 135–145)

## 2019-07-24 NOTE — Progress Notes (Signed)
Physical Therapy Session Note  Patient Details  Name: Jorge Weaver. MRN: [id number] Date of Birth: 1965-04-09  Today's Date: 07/24/2019 PT Individual Time: 1100-1200; 1415-1530 PT Individual Time Calculation (min): 60 min and 75 min  Short Term Goals: Week 3:  PT Short Term Goal 1 (Week 3): Pt will maintain dynamic sitting balance at Supervision level PT Short Term Goal 2 (Week 3): Pt will perform bed mobility at Supervision level consistently PT Short Term Goal 3 (Week 3): Pt will ambulate x 10 ft with LRAD  Skilled Therapeutic Interventions/Progress Updates:    Session 1: Pt received seated in bed, agreeable to PT session. Pt appears to have decreased lethargy this date, reports ongoing hypotension. Semi-reclined BP 98/74 with use of thigh-high TEDs and BLE ACE wrap. Semi-reclined BLE isometric therex in order to raise BP: glute sets, quad sets, resisted PF with use of green theraband x 10-15 reps each. Semi-reclined BP following isometric exercises: 103/72. Supine to sit with min A. Seated BP 111/92. Pt initially has some lightheadedness upon sitting up that resolves with seated rest break. Squat pivot transfer bed to w/c with min A. Dependent transport via w/c to/from therapy gym for time conservation. Seated BLE strengthening and NMR via forced use and reciprocal movement training on Kinetron level 10 cm/sec. Pt is able to perform 4 x 30 sec with focus on hip and knee extension on Kinetron. Pt agreeable to stay seated in w/c at end of session for lunch. Pt left seated in w/c in room with needs in reach, quick release belt and chair alarm in place at end of session.  Session 2: Pt received supine in bed asleep, arousable and agreeable to PT session. No complaints of pain. Supine BP 102/70 with thigh-high TEDs and ACE wrap. Supine to sit with CGA. Seated BP 123/81 with no symptoms of hypotension. Squat pivot transfer to w/c with min A. Dependent transport via w/c to/from therapy gym for time  conservation. Squat pivot transfer w/c to/from mat table with min A. Session focus on BLE in standing and gait training with use of overhead body weight supported system of maxi sky. Pt is able to perform lateral leans on mat table with Supervision for donning of sling. Sit to stand with min to mod A to RW while in maxi sky sling. Ambulation 2 x 20 ft with RW with use of maxi sky sling for safety. Pt exhibits flexed trunk and hips during gait as well as knees locked into extension in stance. Pt exhibits poor proprioception in BLE with ataxic limbs and narrow BOS of times. Pt exhibits decreased balance and overall control when turning, needs max cueing for safety. Utilized mirror during second bout of gait and pt exhibits improved overall posture likely due to proprioceptive deficits limiting ability to determine posture without visual feedback. Pt exhibits fair sitting and standing balance at times with onset of anterior lean with trunk, decreased awareness of posture. Seated lateral leans x 10 reps L/R at Supervision level. Attempt to have pt perform crunches from red therapy wedge, has onset of L hip pain in joint when reclined on wedge that resolves once sitting upright, deferred exercise at this time due to pain. Squat pivot transfer back to w/c then back to bed with min A. Sit to supine Supervision. Pt left semi-reclined in bed with needs in reach, bed alarm in place at end of session.   Therapy Documentation Precautions:  Precautions Precautions: Fall Precaution Comments: ataxia Restrictions Weight Bearing Restrictions: No  Therapy/Group: Individual Therapy   Peter Congo, PT, DPT  07/24/2019, 12:30 PM

## 2019-07-24 NOTE — Progress Notes (Signed)
Occupational Therapy Session Note  Patient Details  Name: Jorge Weaver. MRN: [id number] Date of Birth: 16-Mar-1966  Today's Date: 07/24/2019 OT Individual Time: 0900-1010 OT Individual Time Calculation (min): 70 min    Short Term Goals: Week 3:  OT Short Term Goal 1 (Week 3): STG=LTG secondary to ELOS  Skilled Therapeutic Interventions/Progress Updates:    Pt resting in bed upon arrival and ready for therapy.  Pt much more alert this morning and able to actively engage in conversation and therapy. BP supine at beginning of therapy-111/72. Supine>sit EOB using bed rails with supervision. BP sitting EOB-94/72. Initial plan to bathe with sit<>stand from w/c at sink. After stand pivot transfer (mod A) to w/c pt c/o feeling "woozy" and "seeing blue." BP-88/62. Thigh high Teds donned; BP-96/65 and symptomatic. Pt returned to bed.  BP supine-96/63. Ace Wraps applied to BLE. BP-84/61. Pt remained in bed and completed dressing tasks at bed level with focus on bed mobility. Pt used BLE to reposition in bed closer to head. RN Ed notified of BP during session. Pt remained in bed with all needs within reach and bed alarm activated.   Therapy Documentation Precautions:  Precautions Precautions: Fall Precaution Comments: ataxia Restrictions Weight Bearing Restrictions: No Pain:  Pt c/o ongoing numbness and tingling in BUE/hands although improved from previous day   Therapy/Group: Individual Therapy  [name] 07/24/2019, 10:14 AM

## 2019-07-24 NOTE — Evaluation (Signed)
Recreational Therapy Assessment and Plan  Patient Details  Name: Jorge Weaver. MRN: [id number] Date of Birth: [birth year] Today's Date: 07/24/2019  Rehab Potential:   Good ELOS:   d/c 5/15  Assessment  Problem List:      Patient Active Problem List   Diagnosis Date Noted  . GBS (Guillain-Barre syndrome) ([place]) 07/07/2019  . Hypokalemia 06/28/2019  . Weakness 06/27/2019  . Cervical spondylosis with myelopathy and radiculopathy 01/16/2019  . Spinal stenosis of lumbar region 12/24/2018  . Gastroesophageal reflux disease 06/26/2018  . Essential hypertension 06/17/2018  . Fatty liver   . Witnessed apneic spells 03/28/2017  . Prediabetes 01/18/2017  . Bronchitis 05/21/2014  . ETOH abuse 05/21/2014  . Asthma 01/15/2013  . Asthma with exacerbation 01/15/2013  . Generalized anxiety disorder 08/24/2012  . Malaise and fatigue 08/24/2012    Past Medical History:      Past Medical History:  Diagnosis Date  . Anemia    low iron  . Asthma    as a child  . Cervical spondylosis with myelopathy   . Fatty liver   . GERD (gastroesophageal reflux disease)   . Heart murmur    when he was younger   . Hypertension   . Pneumonia    Past Surgical History:       Past Surgical History:  Procedure Laterality Date  . ANTERIOR CERVICAL DECOMP/DISCECTOMY FUSION N/A 01/16/2019   Procedure: Cervical six corpectomy with Cervical five-seven fusion and plating;  Surgeon: Ashok Pall, MD;  Location: [facility];  Service: Neurosurgery;  Laterality: N/A;  . NO PAST SURGERIES    . WISDOM TOOTH EXTRACTION    . WRIST SURGERY Left 2014   fx    Assessment & Plan Clinical Impression:  Jorge Weaver is a 54 year old male with history of HTN, cervical spondylosis with myelopathy s/p decompression 12/2018, ETOH use who was admitted on 06/29/19 with increasing numbness in BUE/BLE progressing to umbilicus and face, recurrent falls and hesitancy. He was severely hypokalemic- K-2.2 at  admission which was supplemented. MRI brain was negative for acute changes. MRI cervical spine showed ACDF C5-C7 with resolved stenosis and moderate neural foraminal stenosis C5 and C8 nerve roots, severe at L-C6 nerve level. MRI lumbar spine showed progression fo DD L4/L5 with new right foraminal annular fissure and moderate right L4 foraminal stenosis, advanced L5/S1 degeneration with moderate to severe L5 foraminal stenosis. Dr. [name] consulted and felt that surgical intervention not needed.   Dr. Lorraine Lax consulted for input and recommended full work up as well as IV thiamine due to history of alcohol abuse with evidence of macrocytic anemia and to rule out GBS as hypokalemia had improved without improvement in symptoms. Labs for heavy metals and nutritional deficiency negative. RPR/HIV negative. LP done revealing albuminocytologic dissociation and was treated with IVIG X 5 days--completed on 4/16. Blood cultures done 4/15 due to fevers and pending. He developed hyponatremia with drop in Na-125 this weekend --was treated briefly with lasix, tolvaptan and IVF with improvement in sodium levels. Patient continues to be limited by neuropathy with weakness affecting functional status and CIR recommended due to functional decline. Patient transferred to CIR on 07/07/2019 .   Pt presents with decreased activity tolerance, decreased functional mobility, decreased balance, decreased coordination   Limiting pt's independence with leisure/community pursuits.  Plan Min 1 TR session >20 minutes per week during LOS  Recommendations for other services: None   Discharge Criteria: Patient will be discharged from TR if patient refuses treatment  3 consecutive times without medical reason.  If treatment goals not met, if there is a change in medical status, if patient makes no progress towards goals or if patient is discharged from hospital.  The above assessment, treatment plan, treatment alternatives and goals  were discussed and mutually agreed upon: by patient  [place] 07/24/2019, 12:59 PM

## 2019-07-24 NOTE — Progress Notes (Signed)
[facility] PHYSICAL MEDICINE & REHABILITATION PROGRESS NOTE   Subjective/Complaints:  Had a large BM  Yesterday- feeling better.   Nerve pain a ittle better in hands- didn't have to put lidocaine "grease" on hands this AM.    ROS:   Pt denies SOB, abd pain, CP, N/V/C/D, and vision changes   Objective:   No results found. Recent Labs    07/24/19 0509  WBC 5.1  HGB 10.8*  HCT 34.7*  PLT 305   Recent Labs    07/24/19 0509  NA 140  K 4.0  CL 105  CO2 29  GLUCOSE 132*  BUN 9  CREATININE 1.01  CALCIUM 9.1    Intake/Output Summary (Last 24 hours) at 07/24/2019 0902 Last data filed at 07/24/2019 0709 Gross per 24 hour  Intake 516 ml  Output 100 ml  Net 416 ml     Physical Exam: Vital Signs Blood pressure 110/68, pulse 80, temperature (!) 97.5 F (36.4 C), temperature source Oral, resp. rate 18, height 5\' 11"  (1.803 m), weight 92 kg, SpO2 97 %. Constitutional: awake, appropriate, laying in bed, NAD HENT: conjugate gaze; still has facial droop R>L; facial sensation still decreased-chronic- unchanged since last week Cardiovascular: RRR Respiratory: C.mlr GI: Soft, NT, ND, (+)BS  Skin: Warm and dry.  Intact. Psych: awake, appropriate Musc: No edema in extremities.TTP over hands- the actual skin is TTP Neuro: Alert Motor: Bilateral upper extremities: 4 -/5 proximal distal, unchanged Bilateral lower extremities: 4/5 proximally, 3+ distally   Assessment/Plan: 1. 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  Physiatrist is providing close team supervision and 24 hour management of active medical problems listed below.  Physiatrist and rehab team continue to assess barriers to discharge/monitor patient progress toward functional and medical goals  Care Tool:  Bathing    Body parts bathed by  patient: Right arm, Left arm, Chest, Abdomen, Front perineal area, Buttocks, Right upper leg, Left upper leg, Right lower leg, Left lower leg, Face   Body parts bathed by helper: Buttocks, Left lower leg, Right lower leg     Bathing assist Assist Level: Minimal Assistance - Patient > 75%     Upper Body Dressing/Undressing Upper body dressing   What is the patient wearing?: Pull over shirt    Upper body assist Assist Level: Set up assist    Lower Body Dressing/Undressing Lower body dressing      What is the patient wearing?: Incontinence brief, Pants     Lower body assist Assist for lower body dressing: Moderate Assistance - Patient 50 - 74%     Toileting Toileting    Toileting assist Assist for toileting: Maximal Assistance - Patient 25 - 49%     Transfers Chair/bed transfer  Transfers assist     Chair/bed transfer assist level: Minimal Assistance - Patient > 75%     Locomotion Ambulation   Ambulation assist   Ambulation activity did not occur: Safety/medical concerns  Assist level: Moderate Assistance - Patient 50 - 74% Assistive device: Parallel bars Max distance: 2'   Walk 10 feet activity   Assist  Walk 10 feet activity did not occur: Safety/medical concerns        Walk 50 feet activity   Assist Walk 50 feet with 2 turns activity did not occur: Safety/medical concerns         Walk 150 feet activity  Assist Walk 150 feet activity did not occur: Safety/medical concerns         Walk 10 feet on uneven surface  activity   Assist Walk 10 feet on uneven surfaces activity did not occur: Safety/medical concerns         Wheelchair     Assist Will patient use wheelchair at discharge?: Yes Type of Wheelchair: Manual    Wheelchair assist level: Supervision/Verbal cueing Max wheelchair distance: 150'    Wheelchair 50 feet with 2 turns activity    Assist        Assist Level: Supervision/Verbal cueing   Wheelchair  150 feet activity     Assist      Assist Level: Supervision/Verbal cueing   Blood pressure 110/68, pulse 80, temperature (!) 97.5 F (36.4 C), temperature source Oral, resp. rate 18, height 5\' 11"  (1.803 m), weight 92 kg, SpO2 97 %.  Medical Problem List and Plan: 1.  Impaired mobility and ADLs secondary to Guillain Barre Syndrome with miller Fischer Variant.   Continue CIR 2.  Antithrombotics: -DVT/anticoagulation:  Pharmaceutical: Heparin  4/20- changed to lovenox- renal function OK             -antiplatelet therapy: N/a 3. Pain Management: Low back pain + neuropathic pain  Tylenol prn.  lyrica to 100mg  at noc, increased to twice daily on 5/2  Continue lidocaine patches and ointment  Increased duloxetine to 60 mg QHS- for nerve pain  5/3- Nerve pain still a big issue- lyrica increased-   5/5- increased lyrica to 150 mg TID- hopefully will help some  5/6- pain a little better- will con't and increase next week if needed 4. Mood: LCSW to follow for evaluation and support.              -antipsychotic agents: N/a 5. Neuropsych: This patient is capable of making decisions on his own behalf. 6. Skin/Wound Care: Routine pressure relief measures.  7. Fluids/Electrolytes/Nutrition: Monitor I/Os.  8. HTN: Monitor BP tid--continue Hydralazine, metoprolol. Has been labile.   DC'd amlodipine  Vitals:   07/24/19 0547 07/24/19 0807  BP: 101/68 110/68  Pulse: 76 80  Resp: 18   Temp: (!) 97.5 F (36.4 C)   SpO2: 97%    Labile with diastolic elevation on 5/2, monitor trend  5/3- BP running 130s-140s/90s- will d/w team  5/4- BP 136/98 this AM and 122/84- will restart Norvasc but only 2.5 mg daily and see how it goes.  5/5- stopped norvasc yesterday since has been having orthostatic hypotension - but BP running 148/100 when laying down- in 80s/50s when sitting/standing.   9. Hyponatremia: Has received Tolvaptan, fluid restriction, and salt tablets.   4/29- on 1500 cc fluid  restriction  Sodium 132 on 4/26, labs ordered for tomorrow  5/3- Na 139- will stop sodium tablets 1 mg BID and monitor  5/5- labs in AM to f/u on Sodium- make sure not too low  5/6- Na 140- doing great- will liberalize fluid restriction 10. H/o alcohol use: Continue Thiamine and Folic acid. boderline low Mg 1.7-1.8 range.  Added supplement.  4/22- had pt see Neuropsychology.   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   Hemoglobin 10.8 on 4/26  Continue to monitor 12. Abnormal LFTs:   Elevated on 4/20, labs ordered for tomorrow  5/4- LFTs much improved- in normal range now 13. Bladder incontinence/neurogenic bladder:   Improving 14. Dysarthria:   Stable 15. Insomnia: Continue Ambien prn  16. Neurogenic  bowel/constipation- no control  Ordered Mg citrate and add Senokot 1 tab BID on 4/30  Improving  5/3- LBM Saturday but doesn't feel like needs to go- will discuss tomorrow   5/4- will order Sorbitol again and increase senokot to 2 tabs BID  5/5- will give Mg citrate - since hasn't gone  5/6- had good BM- will add Miralax if needed  LOS: 17 days A FACE TO FACE EVALUATION WAS PERFORMED  Jorge Weaver 07/24/2019, 9:02 AM

## 2019-07-25 ENCOUNTER — Inpatient Hospital Stay (HOSPITAL_COMMUNITY): Payer: BC Managed Care – PPO | Admitting: *Deleted

## 2019-07-25 ENCOUNTER — Inpatient Hospital Stay (HOSPITAL_COMMUNITY): Payer: BC Managed Care – PPO

## 2019-07-25 ENCOUNTER — Inpatient Hospital Stay (HOSPITAL_COMMUNITY): Payer: BC Managed Care – PPO | Admitting: Physical Therapy

## 2019-07-25 DIAGNOSIS — R05 Cough: Secondary | ICD-10-CM | POA: Diagnosis not present

## 2019-07-25 DIAGNOSIS — G610 Guillain-Barre syndrome: Secondary | ICD-10-CM | POA: Diagnosis not present

## 2019-07-25 MED ORDER — HYDRALAZINE HCL 25 MG PO TABS
75.0000 mg | ORAL_TABLET | Freq: Three times a day (TID) | ORAL | Status: DC
  Administered 2019-07-25 – 2019-08-02 (×15): 75 mg via ORAL
  Filled 2019-07-25 (×26): qty 3

## 2019-07-25 MED ORDER — ALBUTEROL SULFATE (2.5 MG/3ML) 0.083% IN NEBU: 2.5000 mg | INHALATION_SOLUTION | Freq: Four times a day (QID) | RESPIRATORY_TRACT | Status: DC

## 2019-07-25 MED ORDER — SORBITOL 70 % SOLN
30.0000 mL | Freq: Once | Status: AC
  Administered 2019-07-25: 30 mL via ORAL
  Filled 2019-07-25: qty 30

## 2019-07-25 MED ORDER — GUAIFENESIN-DM 100-10 MG/5ML PO SYRP
5.0000 mL | ORAL_SOLUTION | ORAL | Status: DC | PRN
  Administered 2019-07-28: 5 mL via ORAL
  Filled 2019-07-25 (×2): qty 5

## 2019-07-25 MED ORDER — ALBUTEROL SULFATE (2.5 MG/3ML) 0.083% IN NEBU
2.5000 mg | INHALATION_SOLUTION | Freq: Four times a day (QID) | RESPIRATORY_TRACT | Status: DC
  Administered 2019-07-25 – 2019-07-26 (×3): 2.5 mg via RESPIRATORY_TRACT
  Filled 2019-07-25 (×3): qty 3

## 2019-07-25 MED ORDER — ALBUTEROL SULFATE (2.5 MG/3ML) 0.083% IN NEBU: 2.5000 mg | INHALATION_SOLUTION | Freq: Four times a day (QID) | RESPIRATORY_TRACT | Status: DC | PRN

## 2019-07-25 MED ORDER — DULOXETINE HCL 60 MG PO CPEP
90.0000 mg | ORAL_CAPSULE | Freq: Every day | ORAL | Status: DC
  Administered 2019-07-25 – 2019-08-01 (×8): 90 mg via ORAL
  Filled 2019-07-25 (×8): qty 1

## 2019-07-25 NOTE — Progress Notes (Signed)
[facility] PHYSICAL MEDICINE & REHABILITATION PROGRESS NOTE   Subjective/Complaints:  Pt reports nerve pain is bothering him more this AM- feels like will turn sheets on fire.  Daughter at bedside- concerned about him having productive yellow sputum cough.   LBM 2 days ago.  Room is 74 degrees, but wants it higher  ROS:   Pt denies SOB, abd pain, CP, N/V/C/D, and vision changes   Objective:   DG CHEST PORT 1 VIEW  Result Date: 07/25/2019 CLINICAL DATA:  Cough EXAM: PORTABLE CHEST 1 VIEW COMPARISON:  July 11, 2019 FINDINGS: Lungs are clear. Heart size and pulmonary vascularity are normal. No adenopathy. There is postoperative change in the lower cervical region. No bone lesions. IMPRESSION: Lungs clear.  Cardiac silhouette within normal limits. Electronically Signed   By: Bretta Bang III M.D.   On: 07/25/2019 09:22   Recent Labs    07/24/19 0509  WBC 5.1  HGB 10.8*  HCT 34.7*  PLT 305   Recent Labs    07/24/19 0509  NA 140  K 4.0  CL 105  CO2 29  GLUCOSE 132*  BUN 9  CREATININE 1.01  CALCIUM 9.1    Intake/Output Summary (Last 24 hours) at 07/25/2019 1610 Last data filed at 07/25/2019 0724 Gross per 24 hour  Intake 596 ml  Output 100 ml  Net 496 ml     Physical Exam: Vital Signs Blood pressure 126/86, pulse 88, temperature 98.4 F (36.9 C), temperature source Oral, resp. rate 16, height 5\' 11"  (1.803 m), weight 95.8 kg, SpO2 92 %. Constitutional: awake, alert, shaking hands constantly, daughter at bedside, NAD HENT: conjugate gaze; still has facial droop R>L; facial sensation still decreased-chronic- unchanged since last week Cardiovascular: RRR Respiratory: coarse, a little wheezy, good air movement B/L GI: Soft, NT, ND, (+)BS  Skin: Warm and dry.  Intact. Psych: appropriate Musc: No edema in extremities.TTP over hands- the actual skin is TTP Neuro: Alert Motor: Bilateral upper extremities: 4 -/5 proximal distal, unchanged Bilateral lower  extremities: 4/5 proximally, 3+ distally   Assessment/Plan: 1. 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  Physiatrist is providing close team supervision and 24 hour management of active medical problems listed below.  Physiatrist and rehab team continue to assess barriers to discharge/monitor patient progress toward functional and medical goals  Care Tool:  Bathing    Body parts bathed by patient: Right arm, Left arm, Chest, Abdomen, Front perineal area, Buttocks, Right upper leg, Left upper leg, Right lower leg, Left lower leg, Face   Body parts bathed by helper: Buttocks, Left lower leg, Right lower leg     Bathing assist Assist Level: Minimal Assistance - Patient > 75%     Upper Body Dressing/Undressing Upper body dressing   What is the patient wearing?: Pull over shirt    Upper body assist Assist Level: Set up assist    Lower Body Dressing/Undressing Lower body dressing      What is the patient wearing?: Incontinence brief, Pants     Lower body assist Assist for lower body dressing: Moderate Assistance - Patient 50 - 74%     Toileting Toileting    Toileting assist Assist for toileting: Maximal Assistance - Patient 25 - 49%     Transfers Chair/bed transfer  Transfers assist     Chair/bed transfer assist level: Minimal Assistance - Patient > 75%  Locomotion Ambulation   Ambulation assist   Ambulation activity did not occur: Safety/medical concerns  Assist level: Dependent - Patient 0% Assistive device: Walker-rolling Max distance: 20'   Walk 10 feet activity   Assist  Walk 10 feet activity did not occur: Safety/medical concerns  Assist level: Dependent - Patient 0% Assistive device: Maxi Sky, Walker-rolling   Walk 50 feet activity   Assist Walk 50 feet with 2 turns  activity did not occur: Safety/medical concerns         Walk 150 feet activity   Assist Walk 150 feet activity did not occur: Safety/medical concerns         Walk 10 feet on uneven surface  activity   Assist Walk 10 feet on uneven surfaces activity did not occur: Safety/medical concerns         Wheelchair     Assist Will patient use wheelchair at discharge?: Yes Type of Wheelchair: Manual    Wheelchair assist level: Supervision/Verbal cueing Max wheelchair distance: 150'    Wheelchair 50 feet with 2 turns activity    Assist        Assist Level: Supervision/Verbal cueing   Wheelchair 150 feet activity     Assist      Assist Level: Supervision/Verbal cueing   Blood pressure 126/86, pulse 88, temperature 98.4 F (36.9 C), temperature source Oral, resp. rate 16, height 5\' 11"  (1.803 m), weight 95.8 kg, SpO2 92 %.  Medical Problem List and Plan: 1.  Impaired mobility and ADLs secondary to Guillain Barre Syndrome with miller Fischer Variant.   Continue CIR 2.  Antithrombotics: -DVT/anticoagulation:  Pharmaceutical: Heparin  4/20- changed to lovenox- renal function OK             -antiplatelet therapy: N/a 3. Pain Management: Low back pain + neuropathic pain  Tylenol prn.  lyrica to 100mg  at noc, increased to twice daily on 5/2  Continue lidocaine patches and ointment  Increased duloxetine to 60 mg QHS- for nerve pain  5/3- Nerve pain still a big issue- lyrica increased-   5/5- increased lyrica to 150 mg TID- hopefully will help some  5/6- pain a little better- will con't and increase next week if needed  5/7- worse again this AM- can increase Lyrica THIS WEEKEND to 200 mg TID_ increased Duloxetine to 90 mg daily.  4. Mood: LCSW to follow for evaluation and support.              -antipsychotic agents: N/a 5. Neuropsych: This patient is capable of making decisions on his own behalf. 6. Skin/Wound Care: Routine pressure relief measures.  7.  Fluids/Electrolytes/Nutrition: Monitor I/Os.  8. HTN: Monitor BP tid--continue Hydralazine, metoprolol. Has been labile.   DC'd amlodipine  Vitals:   07/25/19 0450 07/25/19 0827  BP: 124/87 126/86  Pulse: 87 88  Resp: 16   Temp: 98.4 F (36.9 C)   SpO2: 92%    Labile with diastolic elevation on 5/2, monitor trend  5/3- BP running 130s-140s/90s- will d/w team  5/4- BP 136/98 this AM and 122/84- will restart Norvasc but only 2.5 mg daily and see how it goes.  5/5- stopped norvasc yesterday since has been having orthostatic hypotension - but BP running 148/100 when laying down- in 80s/50s when sitting/standing.   5/7- has orthostatic hypotension yesterday, wearing TEDs and abd binder but BP variable when laying down- can add midodrine, but would make supine HTN worse- will decrease meds   9. Hyponatremia: Has received Tolvaptan, fluid restriction, and  salt tablets.   4/29- on 1500 cc fluid restriction  Sodium 132 on 4/26, labs ordered for tomorrow  5/3- Na 139- will stop sodium tablets 1 mg BID and monitor  5/5- labs in AM to f/u on Sodium- make sure not too low  5/6- Na 140- doing great- will liberalize fluid restriction 10. H/o alcohol use: Continue Thiamine and Folic acid. boderline low Mg 1.7-1.8 range.  Added supplement.  4/22- had pt see Neuropsychology.   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   Hemoglobin 10.8 on 4/26  Continue to monitor 12. Abnormal LFTs:   Elevated on 4/20, labs ordered for tomorrow  5/4- LFTs much improved- in normal range now 13. Bladder incontinence/neurogenic bladder:   Improving 14. Dysarthria:   Stable 15. Insomnia: Continue Ambien prn  16. Neurogenic bowel/constipation- no control  Ordered Mg citrate and add Senokot 1 tab BID on 4/30  Improving  5/3- LBM Saturday but doesn't feel like needs to go- will discuss tomorrow   5/4- will order Sorbitol again and increase senokot to 2 tabs BID  5/5- will give Mg  citrate - since hasn't gone  5/6- had good BM- will add Miralax if needed 17. Cough/wheezing  5/7- got CXR which looks good- added nebs QID x2 days and then prn; cough meds and will monitor   LOS: 18 days A FACE TO FACE EVALUATION WAS PERFORMED  Jorge Weaver 07/25/2019, 9:28 AM

## 2019-07-25 NOTE — Progress Notes (Signed)
Social Work Patient ID: Jorge Glad., Jorge Weaver   DOB: 10/28/1965, 54 y.o.   MRN: [id number]   Sw received message to call spouse. Spouse followed up with spouse. Answered questions regarding Medicaid. Sw provided spouse with online Medicaid application link. Sw will also provide Home Care agencies to spouse, will leave packet in room and follow up. Spouse and daughter want to ensure patient stays warm. Patient has been super cold starting yesterday. Per Dr.Lovorns note patient producing yellow sputum cough. Just want to ensure were keeping the room warm and keeping patient comfortable.

## 2019-07-25 NOTE — Progress Notes (Signed)
Physical Therapy Session Note  Patient Details  Name: Jorge Weaver. MRN: [id number] Date of Birth: 1965-11-03  Today's Date: 07/25/2019 PT Individual Time: 1100-1145; 1415-1530 PT Individual Time Calculation (min): 45 min and 75 min PT Missed Time: 15 min Missed Time Reason: fatigue  Short Term Goals: Week 3:  PT Short Term Goal 1 (Week 3): Pt will maintain dynamic sitting balance at Supervision level PT Short Term Goal 2 (Week 3): Pt will perform bed mobility at Supervision level consistently PT Short Term Goal 3 (Week 3): Pt will ambulate x 10 ft with LRAD  Skilled Therapeutic Interventions/Progress Updates:    Session 1: Pt received semi-reclined in bed, agreeable to PT session. Semi-reclined BP 97/70 with use of thigh-high TEDs. Assisted pt with donning BLE ACE wraps for BP management. Rolling L/R with min A in order for RN to place lidocaine patches to low back for pain management. Pt also reports ongoing nerve pain in hands. Supine BP after donning ACE wraps 107/72. Supine to sit with mod A for some LE management and trunk control. Once seated EOB pt reports feeling slightly "woozy" and "loopy". Pt with increase in anterior lean in sitting this date, poor awareness of body positioning and feels he is falling backwards when sitting upright. Seated BP initially 92/67 but after several minutes of sitting 79/59. Deferred any OOB mobility this date due to low BP and pt feeling "loopy". Returned to supine with mod A for BLE management. Supine BP 97/77. Attempted to have pt engage in BLE strengthening at bed level. He is able to perform a few repetitions of resisted PF, hip abd, and heel slides on LLE before onset of fatigue and no longer able to assist with therex. Pt left semi-reclined in bed with needs in reach, bed alarm in place. Pt missed 15 min of scheduled therapy session due to ongoing fatigue. Cotreatment session with LRT.  Session 2: Pt received seated in bed asleep, arousable and  agreeable to PT session. Pt reports decreased lethargy from AM session. Semi-reclined BP 114/66. Supine to sit with Supervision with HOB elevated, use of bedrail, significantly increased time to perform without physical assist. Once seated EOB pt exhibits improved sitting balance from AM session and is able to maintain upright posture with close SBA. Seated BP 103/78. Squat pivot transfer to w/c with mod A. Manual w/c propulsion 2 x 150 ft with use of BUE at Supervision level. Squat pivot transfer w/c to/from Nustep with min A. Pt reports L ankle soreness/pain with DF overpressure. Unable to reproduce pain but pt does have some tenderness to pressure at L medial ankle, no swelling noted. Will continue to monitor. Nustep level 8 x 5 min with use of B UE/LE for global strengthening. Pt reports 13/20 on Borg scale while performing Nustep. Pt reports tenderness on bottom of feet following Nustep exercise. Manual w/c propulsion x 50 ft forwards and backwards with focus on use of BLE to propel chair, min A required and max cueing for attention to LLE. Seated BUE fine motor and grip task completing peg board design with use of both UE, increased time and attempts needed for use of LUE. Pt exhibits good sitting balance EOM while completing peg board design with close SBA needed for balance and occasional lateral lean to the L noted, about to correct with verbal cueing. Pt requests to return to bed at end of session. Squat pivot transfer back to bed min A. Sit to supine mod A for BLE management. Pt  left semi-reclined in bed with needs in reach, bed alarm in place at end of session.  Therapy Documentation Precautions:  Precautions Precautions: Fall Precaution Comments: ataxia Restrictions Weight Bearing Restrictions: No    Therapy/Group: Individual Therapy   Peter Congo, PT, DPT  07/25/2019, 12:31 PM

## 2019-07-25 NOTE — Progress Notes (Signed)
Occupational Therapy Session Note  Patient Details  Name: Jorge Weaver. MRN: [id number] Date of Birth: [birth year]  Today's Date: 07/25/2019 OT Individual Time: 0900-1008 OT Individual Time Calculation (min): 68 min    Short Term Goals: Week 3:  OT Short Term Goal 1 (Week 3): STG=LTG secondary to ELOS  Skilled Therapeutic Interventions/Progress Updates:    Pt resting in bed upon arrival.  Pt stated he was eager to take a shower this morning.  Explained to pt that his BP needed to remain within parameters for safety reasons. BP readings during therapy as follows: Without Ted hose Supine-113/79 Sitting EOB-87/73 and symptomatic  Pt returned to supine and Ted hose donned Supine-98/64 EOB-99/70 EOB-114/95 but pt c/o being woozy and unable to maintain static sitting balance and returned to supine Supine-90/54 and pt continue to c/o being woozy  Attempted to engaged in UB bathing/dressing tasks seated EOB but pt unable to maintain sitting balance to adequately perform tasks safely.  Pt remained in bed with all needs within reach and bed alarm activated. Pt expressed frustration with BP variations and woozy feelings.  Therapy Documentation Precautions:  Precautions Precautions: Fall Precaution Comments: ataxia Restrictions Weight Bearing Restrictions: No  Pain:  Pt c/o ongoing BUE/hand pain; emotional support   Therapy/Group: Individual Therapy  [name] 07/25/2019, 10:14 AM

## 2019-07-25 NOTE — Progress Notes (Signed)
Recreational Therapy Session Note  Patient Details  Name: Jorge Weaver. MRN: [id number] Date of Birth: 01/15/1966 Today:  07/25/2019 Pain: c/o of hand burning, lifted them off the bed and moving them assisted with relief Skilled Therapeutic Interventions/Progress Updates: Attempted OOB activity during co-treat with PT today.  Pt initially with low BP reading (see PT note for specifics). Ace wraps applied to BLEs in addition to the teds he was already wearing.  BP came up and pt agreeable to sit EOB.  Once EOB, pt stated he felt "loopy" but wanted to try to work in therapy.  BP reassessed, 79/59.  Returned to supine and BP came up to 97/77. Pt left with PT in the room attempting bed level LE exercise.  Therapy/Group: Co-Treatment  Jorge Weaver 07/25/2019, 12:16 PM

## 2019-07-26 MED ORDER — ALBUTEROL SULFATE (2.5 MG/3ML) 0.083% IN NEBU: 2.5000 mg | INHALATION_SOLUTION | RESPIRATORY_TRACT | Status: DC | PRN

## 2019-07-26 NOTE — Progress Notes (Signed)
[place] PHYSICAL MEDICINE & REHABILITATION PROGRESS NOTE   Subjective/Complaints:  Ongoing leg pain  ROS: Patient denies fever, rash, sore throat, blurred vision, nausea, vomiting, diarrhea, cough, shortness of breath or chest pain, joint or back pain, headache, or mood change.    Objective:   DG CHEST PORT 1 VIEW  Result Date: 07/25/2019 CLINICAL DATA:  Cough EXAM: PORTABLE CHEST 1 VIEW COMPARISON:  July 11, 2019 FINDINGS: Lungs are clear. Heart size and pulmonary vascularity are normal. No adenopathy. There is postoperative change in the lower cervical region. No bone lesions. IMPRESSION: Lungs clear.  Cardiac silhouette within normal limits. Electronically Signed   By: Lowella Grip III M.D.   On: 07/25/2019 09:22   Recent Labs    07/24/19 0509  WBC 5.1  HGB 10.8*  HCT 34.7*  PLT 305   Recent Labs    07/24/19 0509  NA 140  K 4.0  CL 105  CO2 29  GLUCOSE 132*  BUN 9  CREATININE 1.01  CALCIUM 9.1    Intake/Output Summary (Last 24 hours) at 07/26/2019 0939 Last data filed at 07/26/2019 0730 Gross per 24 hour  Intake 600 ml  Output 250 ml  Net 350 ml     Physical Exam: Vital Signs Blood pressure (!) 139/108, pulse 79, temperature 98.3 F (36.8 C), resp. rate 18, height 5\' 11"  (1.803 m), weight 95.8 kg, SpO2 92 %. Constitutional: No distress . Vital signs reviewed. HEENT: EOMI, oral membranes moist Neck: supple Cardiovascular: RRR without murmur. No JVD    Respiratory/Chest: CTA Bilaterally without wheezes or rales. Normal effort    GI/Abdomen: BS +, non-tender, non-distended Ext: no clubbing, cyanosis, or edema Psych: pleasant and cooperative Skin: Warm and dry.  Intact. Psych: appropriate Musc: No edema in extremities.TTP over hands- the actual skin is TTP Neuro: Alert Motor: Bilateral upper extremities: 4 -/5 proximal distal, unchanged Bilateral lower extremities: 4/5 proximally, 3+ distally   Assessment/Plan: 1. 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  Physiatrist is providing close team supervision and 24 hour management of active medical problems listed below.  Physiatrist and rehab team continue to assess barriers to discharge/monitor patient progress toward functional and medical goals  Care Tool:  Bathing    Body parts bathed by patient: Right arm, Left arm, Chest, Abdomen, Front perineal area, Buttocks, Right upper leg, Left upper leg, Right lower leg, Left lower leg, Face   Body parts bathed by helper: Buttocks, Left lower leg, Right lower leg     Bathing assist Assist Level: Minimal Assistance - Patient > 75%     Upper Body Dressing/Undressing Upper body dressing   What is the patient wearing?: Pull over shirt    Upper body assist Assist Level: Set up assist    Lower Body Dressing/Undressing Lower body dressing      What is the patient wearing?: Incontinence brief, Pants     Lower body assist Assist for lower body dressing: Moderate Assistance - Patient 50 - 74%     Toileting Toileting    Toileting assist Assist for toileting: Maximal Assistance - Patient 25 - 49%     Transfers Chair/bed transfer  Transfers assist     Chair/bed transfer assist level: Moderate Assistance - Patient 50 - 74%     Locomotion Ambulation   Ambulation assist   Ambulation activity did not occur: Safety/medical concerns  Assist  level: Dependent - Patient 0% Assistive device: Walker-rolling Max distance: 20'   Walk 10 feet activity   Assist  Walk 10 feet activity did not occur: Safety/medical concerns  Assist level: Dependent - Patient 0% Assistive device: Maxi Sky, Walker-rolling   Walk 50 feet activity   Assist Walk 50 feet with 2 turns activity did not occur: Safety/medical concerns         Walk 150 feet activity   Assist  Walk 150 feet activity did not occur: Safety/medical concerns         Walk 10 feet on uneven surface  activity   Assist Walk 10 feet on uneven surfaces activity did not occur: Safety/medical concerns         Wheelchair     Assist Will patient use wheelchair at discharge?: Yes Type of Wheelchair: Manual    Wheelchair assist level: Supervision/Verbal cueing Max wheelchair distance: 150'    Wheelchair 50 feet with 2 turns activity    Assist        Assist Level: Supervision/Verbal cueing   Wheelchair 150 feet activity     Assist      Assist Level: Supervision/Verbal cueing   Blood pressure (!) 139/108, pulse 79, temperature 98.3 F (36.8 C), resp. rate 18, height 5\' 11"  (1.803 m), weight 95.8 kg, SpO2 92 %.  Medical Problem List and Plan: 1.  Impaired mobility and ADLs secondary to Guillain Barre Syndrome with miller Fischer Variant.   Continue CIR 2.  Antithrombotics: -DVT/anticoagulation:  Pharmaceutical: Heparin  4/20- changed to lovenox- renal function OK             -antiplatelet therapy: N/a 3. Pain Management: Low back pain + neuropathic pain  Tylenol prn.  lyrica to 100mg  at noc, increased to twice daily on 5/2  Continue lidocaine patches and ointment  Increased duloxetine to 60 mg QHS- for nerve pain  5/3- Nerve pain still a big issue- lyrica increased-   5/5- increased lyrica to 150 mg TID- hopefully will help some  5/6- pain a little better- will con't and increase next week if needed  5/7- worse again this AM- can increase Lyrica THIS WEEKEND to 200 mg TID_ increased Duloxetine to 90 mg daily  5/8 aggressive recent changes. Appears stable today. observe  4. Mood: LCSW to follow for evaluation and support.              -antipsychotic agents: N/a 5. Neuropsych: This patient is capable of making decisions on his own behalf. 6. Skin/Wound Care: Routine pressure relief measures.  7. Fluids/Electrolytes/Nutrition: Monitor I/Os.  8. HTN:  Monitor BP tid--continue Hydralazine, metoprolol. Has been labile.   DC'd amlodipine  Vitals:   07/26/19 0511 07/26/19 0723  BP: (!) 139/108   Pulse: 79   Resp:    Temp:    SpO2:  92%   Labile with diastolic elevation on 5/2, monitor trend  5/3- BP running 130s-140s/90s- will d/w team  5/4- BP 136/98 this AM and 122/84- will restart Norvasc but only 2.5 mg daily and see how it goes.  5/5- stopped norvasc yesterday since has been having orthostatic hypotension - but BP running 148/100 when laying down- in 80s/50s when sitting/standing.   5/7- has orthostatic hypotension yesterday, wearing TEDs and abd binder but BP variable when laying down- can add midodrine, but would make supine HTN worse- will decrease meds    5/8 resting DBP 108 this morning. If it continues to be high, will need to increase hydralazine or lopressor  again. It's only one isolated bp however--watch today 9. Hyponatremia: Has received Tolvaptan, fluid restriction, and salt tablets.   4/29- on 1500 cc fluid restriction  Sodium 132 on 4/26, labs ordered for tomorrow  5/3- Na 139- will stop sodium tablets 1 mg BID and monitor  5/5- labs in AM to f/u on Sodium- make sure not too low  5/6- Na 140- doing great-   fluid restriction decreased 10. H/o alcohol use: Continue Thiamine and Folic acid. boderline low Mg 1.7-1.8 range.  Added supplement.  4/22- had pt see Neuropsychology.   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   Hemoglobin 10.8 on 4/26  Continue to monitor 12. Abnormal LFTs:   Elevated on 4/20, labs ordered for tomorrow  5/4- LFTs much improved- in normal range now 13. Bladder incontinence/neurogenic bladder:   Improving 14. Dysarthria:   Stable 15. Insomnia: Continue Ambien prn  16. Neurogenic bowel/constipation- no control  Ordered Mg citrate and add Senokot 1 tab BID on 4/30  Improving  5/3- LBM Saturday but doesn't feel like needs to go- will discuss  tomorrow   5/4- will order Sorbitol again and increase senokot to 2 tabs BID  5/5- will give Mg citrate - since hasn't gone  5/6- had good BM- will add Miralax if needed 17. Cough/wheezing  5/7- got CXR which looks good- added nebs QID x2 days and then prn; cough meds and will monitor  5/8 encourage OOB, IS, mobilization of secretions  LOS: 19 days A FACE TO FACE EVALUATION WAS PERFORMED  Ranelle Oyster 07/26/2019, 9:39 AM

## 2019-07-27 ENCOUNTER — Inpatient Hospital Stay (HOSPITAL_COMMUNITY): Payer: BC Managed Care – PPO

## 2019-07-27 MED ORDER — WHITE PETROLATUM EX OINT
TOPICAL_OINTMENT | CUTANEOUS | Status: AC
  Filled 2019-07-27: qty 28.35

## 2019-07-27 MED ORDER — PREGABALIN 75 MG PO CAPS
200.0000 mg | ORAL_CAPSULE | Freq: Three times a day (TID) | ORAL | Status: DC
  Administered 2019-07-27 – 2019-08-02 (×18): 200 mg via ORAL
  Filled 2019-07-27 (×5): qty 2
  Filled 2019-07-27: qty 1
  Filled 2019-07-27 (×6): qty 2
  Filled 2019-07-27: qty 1
  Filled 2019-07-27 (×5): qty 2

## 2019-07-27 NOTE — Progress Notes (Signed)
[place] PHYSICAL MEDICINE & REHABILITATION PROGRESS NOTE   Subjective/Complaints:  Overall doing ok. Still having pain in his feet/hands. Is trying to deal with it  ROS: Patient denies fever, rash, sore throat, blurred vision, nausea, vomiting, diarrhea, cough, shortness of breath or chest pain, back pain, headache, or mood change. .    Objective:   DG CHEST PORT 1 VIEW  Result Date: 07/25/2019 CLINICAL DATA:  Cough EXAM: PORTABLE CHEST 1 VIEW COMPARISON:  July 11, 2019 FINDINGS: Lungs are clear. Heart size and pulmonary vascularity are normal. No adenopathy. There is postoperative change in the lower cervical region. No bone lesions. IMPRESSION: Lungs clear.  Cardiac silhouette within normal limits. Electronically Signed   By: Bretta Bang III M.D.   On: 07/25/2019 09:22   No results for input(s): WBC, HGB, HCT, PLT in the last 72 hours. No results for input(s): NA, K, CL, CO2, GLUCOSE, BUN, CREATININE, CALCIUM in the last 72 hours.  Intake/Output Summary (Last 24 hours) at 07/27/2019 0855 Last data filed at 07/27/2019 0225 Gross per 24 hour  Intake 240 ml  Output 675 ml  Net -435 ml     Physical Exam: Vital Signs Blood pressure 92/70, pulse 75, temperature 98 F (36.7 C), temperature source Oral, resp. rate 18, height 5\' 11"  (1.803 m), weight 95.8 kg, SpO2 99 %. Constitutional: No distress . Vital signs reviewed. HEENT: EOMI, oral membranes moist Neck: supple Cardiovascular: RRR without murmur. No JVD    Respiratory/Chest: CTA Bilaterally without wheezes or rales. Normal effort    GI/Abdomen: BS +, non-tender, non-distended Ext: no clubbing, cyanosis, or edema Psych: pleasant and cooperative Skin: Warm and dry.  Intact. Psych: appropriate Musc: No edema in extremities.TTP over hands- the actual skin is TTP Neuro: Alert. Distal sensory loss in all 4's Motor: Bilateral upper extremities: 4 -/5 proximal distal, unchanged Bilateral lower extremities: 4/5 proximally, 3+  distally   Assessment/Plan: 1. 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  Physiatrist is providing close team supervision and 24 hour management of active medical problems listed below.  Physiatrist and rehab team continue to assess barriers to discharge/monitor patient progress toward functional and medical goals  Care Tool:  Bathing    Body parts bathed by patient: Right arm, Left arm, Chest, Abdomen, Front perineal area, Buttocks, Right upper leg, Left upper leg, Right lower leg, Left lower leg, Face   Body parts bathed by helper: Buttocks, Left lower leg, Right lower leg     Bathing assist Assist Level: Minimal Assistance - Patient > 75%     Upper Body Dressing/Undressing Upper body dressing   What is the patient wearing?: Pull over shirt    Upper body assist Assist Level: Set up assist    Lower Body Dressing/Undressing Lower body dressing      What is the patient wearing?: Incontinence brief, Pants     Lower body assist Assist for lower body dressing: Moderate Assistance - Patient 50 - 74%     Toileting Toileting    Toileting assist Assist for toileting: Maximal Assistance - Patient 25 - 49%     Transfers Chair/bed transfer  Transfers assist     Chair/bed transfer assist level: Moderate Assistance - Patient 50 - 74%     Locomotion Ambulation   Ambulation assist   Ambulation activity did not occur: Safety/medical concerns  Assist level: Dependent - Patient 0%  Assistive device: Walker-rolling Max distance: 20'   Walk 10 feet activity   Assist  Walk 10 feet activity did not occur: Safety/medical concerns  Assist level: Dependent - Patient 0% Assistive device: Maxi Sky, Walker-rolling   Walk 50 feet activity   Assist Walk 50 feet with 2 turns activity did not occur:  Safety/medical concerns         Walk 150 feet activity   Assist Walk 150 feet activity did not occur: Safety/medical concerns         Walk 10 feet on uneven surface  activity   Assist Walk 10 feet on uneven surfaces activity did not occur: Safety/medical concerns         Wheelchair     Assist Will patient use wheelchair at discharge?: Yes Type of Wheelchair: Manual    Wheelchair assist level: Supervision/Verbal cueing Max wheelchair distance: 150'    Wheelchair 50 feet with 2 turns activity    Assist        Assist Level: Supervision/Verbal cueing   Wheelchair 150 feet activity     Assist      Assist Level: Supervision/Verbal cueing   Blood pressure 92/70, pulse 75, temperature 98 F (36.7 C), temperature source Oral, resp. rate 18, height 5\' 11"  (1.803 m), weight 95.8 kg, SpO2 99 %.  Medical Problem List and Plan: 1.  Impaired mobility and ADLs secondary to Guillain Barre Syndrome with miller Fischer Variant.   Continue CIR 2.  Antithrombotics: -DVT/anticoagulation:  Pharmaceutical: Heparin  4/20- changed to lovenox- renal function OK             -antiplatelet therapy: N/a 3. Pain Management: Low back pain + neuropathic pain  Tylenol prn.  lyrica to 100mg  at noc, increased to twice daily on 5/2  Continue lidocaine patches and ointment  Increased duloxetine to 60 mg QHS- for nerve pain   5/7- worse again this AM- can increase Lyrica THIS WEEKEND to 200 mg TID_ increased Duloxetine to 90 mg daily  5/9 I asked the patient if he wanted to try more lyrica, and he said he did. Will increase to 200mg  tid 4. Mood: LCSW to follow for evaluation and support.              -antipsychotic agents: N/a 5. Neuropsych: This patient is capable of making decisions on his own behalf. 6. Skin/Wound Care: Routine pressure relief measures.  7. Fluids/Electrolytes/Nutrition: Monitor I/Os.  8. HTN: Monitor BP tid--continue Hydralazine, metoprolol. Has been  labile.   DC'd amlodipine  Vitals:   07/27/19 0514 07/27/19 0825  BP: 101/81 92/70  Pulse: 73 75  Resp:    Temp: 98 F (36.7 C)   SpO2: 99%    Labile with diastolic elevation on 5/2, monitor trend  5/3- BP running 130s-140s/90s- will d/w team  5/4- BP 136/98 this AM and 122/84- will restart Norvasc but only 2.5 mg daily and see how it goes.  5/5- stopped norvasc yesterday since has been having orthostatic hypotension - but BP running 148/100 when laying down- in 80s/50s when sitting/standing.   5/7- has orthostatic hypotension yesterday, wearing TEDs and abd binder but BP variable when laying down- can add midodrine, but would make supine HTN worse- will decrease meds    5/8-9 resting DBP high yesterday, improved today. No changes for now 9. Hyponatremia: Has received Tolvaptan, fluid restriction, and salt tablets.   4/29- on 1500 cc fluid restriction  Sodium 132 on 4/26, labs ordered for tomorrow  5/3- Na 139- will  stop sodium tablets 1 mg BID and monitor  5/5- labs in AM to f/u on Sodium- make sure not too low  5/6- Na 140- doing great-   fluid restriction decreased 10. H/o alcohol use: Continue Thiamine and Folic acid. boderline low Mg 1.7-1.8 range.  Added supplement.  4/22- had pt see Neuropsychology.   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   Hemoglobin 10.8 on 4/26  Continue to monitor 12. Abnormal LFTs:   Elevated on 4/20, labs ordered for tomorrow  5/4- LFTs much improved- in normal range now 13. Bladder incontinence/neurogenic bladder:   Improving 14. Dysarthria:   Stable 15. Insomnia: Continue Ambien prn  16. Neurogenic bowel/constipation- no control  Ordered Mg citrate and add Senokot 1 tab BID on 4/30  Improving  5/3- LBM Saturday but doesn't feel like needs to go- will discuss tomorrow   5/4- will order Sorbitol again and increase senokot to 2 tabs BID  5/5- will give Mg citrate - since hasn't gone  5/6- had good BM- will  add Miralax if needed 17. Cough/wheezing  5/7- got CXR which looks good- added nebs QID x2 days and then prn; cough meds and will monitor  5/8-9 encouraging OOB, IS, mobilization of secretions  LOS: 20 days A FACE TO FACE EVALUATION WAS PERFORMED  Ranelle Oyster 07/27/2019, 8:55 AM

## 2019-07-27 NOTE — Progress Notes (Signed)
Occupational Therapy Session Note  Patient Details  Name: Jorge Weaver. MRN: [id number] Date of Birth: 23-Apr-1965  Today's Date: 07/27/2019 OT Individual Time: 4944-9675 OT Individual Time Calculation (min): 72 min    Short Term Goals: Week 3:  OT Short Term Goal 1 (Week 3): STG=LTG secondary to ELOS  Skilled Therapeutic Interventions/Progress Updates:    Pt resting in bed upon arrival with MD present.  Pt awake, alert, and ready for therapy. BP supine 129/104.  Supine>sit EOB with supervision using bed rails and HOB elevated. BP seated EOB 131/100.  Pt agreeable to taking shower this morning.  No s/s of dizziness. Pt completed shower with min A for bathing buttocks using lateral leans. Stand pivot tranfsers w/c<>TTB witn min A using grab bars. Dressing with sit<>stand from w/c at sink.  Pt required assistance pulling pants over hips and donning Ted Hose. No c/o dizziness. Ted Hose donned after shower and BP 117/85. Pt completed grooming and UB dressing with setup assistance. Pt requires more than a reasonable amount of time to complete tasks.  Pt's ataxia improving. Pt remained seated in w/c with belt alarm activated.  Breakfast on bedside table in front of pt.  Pt's wife present.   Therapy Documentation Precautions:  Precautions Precautions: Fall Precaution Comments: ataxia Restrictions Weight Bearing Restrictions: No  Pain:  Pt c/o ongoing BUE/hands pain/tingling/numbness; MD aware and emotional support   Therapy/Group: Individual Therapy  [name] 07/27/2019, 8:14 AM

## 2019-07-28 ENCOUNTER — Inpatient Hospital Stay (HOSPITAL_COMMUNITY): Payer: BC Managed Care – PPO

## 2019-07-28 ENCOUNTER — Inpatient Hospital Stay (HOSPITAL_COMMUNITY): Payer: BC Managed Care – PPO | Admitting: Physical Therapy

## 2019-07-28 LAB — CBC
HCT: 33.6 % — ABNORMAL LOW (ref 39.0–52.0)
Hemoglobin: 10.6 g/dL — ABNORMAL LOW (ref 13.0–17.0)
MCH: 31.6 pg (ref 26.0–34.0)
MCHC: 31.5 g/dL (ref 30.0–36.0)
MCV: 100.3 fL — ABNORMAL HIGH (ref 80.0–100.0)
Platelets: 293 10*3/uL (ref 150–400)
RBC: 3.35 MIL/uL — ABNORMAL LOW (ref 4.22–5.81)
RDW: 14.3 % (ref 11.5–15.5)
WBC: 5.4 10*3/uL (ref 4.0–10.5)
nRBC: 0 % (ref 0.0–0.2)

## 2019-07-28 MED ORDER — MAGNESIUM CITRATE PO SOLN
0.5000 | Freq: Once | ORAL | Status: AC
  Administered 2019-07-28: 0.5 via ORAL
  Filled 2019-07-28: qty 296

## 2019-07-28 NOTE — Progress Notes (Signed)
Occupational Therapy Session Note  Patient Details  Name: Jorge Weaver. MRN: [id number] Date of Birth: 12/27/1965  Today's Date: 07/28/2019 OT Individual Time: 1300-1410 OT Individual Time Calculation (min): 70 min    Short Term Goals: Week 3:  OT Short Term Goal 1 (Week 3): STG=LTG secondary to ELOS  Skilled Therapeutic Interventions/Progress Updates:    Pt resting in bed upon arrival and agreeable to therapy.  Supine>sit EOB with supervision and stand pivot transfer with min A to w/c.  Pt engaged in standing activity while constructing pipe tree structure. Pt noted with B hyperextension of B knees for stability when standing.  Pt required mod verbal cues to correct posture to upright position.  Pt required 3 rest breaks during assembly and disassembly. Pt donned exam gloves and obtained disinfectant wipes to clean pvc piping.  Pt completed task while dropping 4 pieces of piping on floor. Pt commented after tasks that his hands were "killing" him. Pt also commented that his shoulders were sore because he had to "work" to keep his arms from raising up. Pt returned to room and remained in w/c with belt alarm activated and all needs within reach.    Therapy Documentation Precautions:  Precautions Precautions: Fall Precaution Comments: ataxia Restrictions Weight Bearing Restrictions: No Pain:  Pt c/o increase B hand pain after engaging in table tasks; emotional support   Therapy/Group: Individual Therapy  [name] 07/28/2019, 2:18 PM

## 2019-07-28 NOTE — Progress Notes (Signed)
Physical Therapy Session Note  Patient Details  Name: Jorge Weaver. MRN: [id number] Date of Birth: 09-19-1965  Today's Date: 07/28/2019 PT Individual Time: 5374-8270 PT Individual Time Calculation (min): 45 min   Short Term Goals: Week 3:  PT Short Term Goal 1 (Week 3): Pt will maintain dynamic sitting balance at Supervision level PT Short Term Goal 2 (Week 3): Pt will perform bed mobility at Supervision level consistently PT Short Term Goal 3 (Week 3): Pt will ambulate x 10 ft with LRAD  Skilled Therapeutic Interventions/Progress Updates:    Pt received seated in w/c in room, agreeable to PT session. Pt reports ongoing burning pain in hands, use of medication and numbing cream on hands for pain management. Dependent transport via w/c to/from therapy gym for time conservation. Squat pivot transfer to mat table with mod A, mod cues for safety and safe setup of transfer. Discussion with pt about upcoming discharge and that stairs remain a barrier for him to enter his home. Pt reports he has not spoken with his family with regards to stair management despite this therapist discussing it with him last week and encouraging him to speak to his family about stairs. Again recommending installation of a stair lift at pt's house or staying with other family who do not have stairs to enter their home upon d/c as pt will not be able to navigate up/down stairs upon d/c. Pt may also be able to have family members bump him up/down stairs in his w/c but he will need two physically strong people in order to assist him with this. Again encouraged pt to speak with his family with regards to plan for stair management upon d/c and see which option will be best for them. Sit to stand with mod A to RW from elevated mat table. Pt exhibits improved posture in standing this date with improved hip and trunk extension and upright stance. Standing mini-squats 2 x 10 reps with RW and min to mod A for standing balance. Pt exhibits  decreased control over L quads as compared to RLE. Pt reports he feels that his hands are "slipping" on RW handles. Applied coban for improved grip but pt reports this is too rough due to hypersensitivity in hands, removed coban. Pt requests to return to bed at end of session. Squat pivot transfer back to bed with mod A. Sit to supine min A for RLE management. Pt left semi-reclined in bed with needs in reach, bed alarm in place at end of session.  Therapy Documentation Precautions:  Precautions Precautions: Fall Precaution Comments: ataxia Restrictions Weight Bearing Restrictions: No    Therapy/Group: Individual Therapy   Peter Congo, PT, DPT  07/28/2019, 12:36 PM

## 2019-07-28 NOTE — Progress Notes (Signed)
[place] PHYSICAL MEDICINE & REHABILITATION PROGRESS NOTE   Subjective/Complaints:  Denies symptoms of orthostatic hypotension- lowest BP 92/60.   Nerve pain a little better- lyrica increased yesterday. LBM Friday- needs to go, but cannot.     ROS:  Pt denies SOB, abd pain, CP, N/V/C/D, and vision changes   Objective:   No results found. Recent Labs    07/28/19 0521  WBC 5.4  HGB 10.6*  HCT 33.6*  PLT 293   No results for input(s): NA, K, CL, CO2, GLUCOSE, BUN, CREATININE, CALCIUM in the last 72 hours.  Intake/Output Summary (Last 24 hours) at 07/28/2019 0855 Last data filed at 07/28/2019 0709 Gross per 24 hour  Intake 480 ml  Output 650 ml  Net -170 ml     Physical Exam: Vital Signs Blood pressure (!) 133/93, pulse 73, temperature 98.4 F (36.9 C), temperature source Oral, resp. rate 18, height 5\' 11"  (1.803 m), weight 95.8 kg, SpO2 99 %. Constitutional: sitting up at bedside in manual w/c, OT at bedside, NAD HEENT: conjugate gaze- chronic facial sensation decreased Neck: supple Cardiovascular: RRR  Respiratory/Chest: CTA B/L- no W/R/R- good air movement GI/Abdomen: Soft, NT, ND, (+)BS  Ext: no clubbing, cyanosis, or edema Psych: pleasant and cooperative Skin: Warm and dry.  Intact. Psych: appropriate; joking Musc: No edema in extremities.no hands swelling, but holding like Tender, up in air- fanning them Neuro: Alert. Distal sensory loss in all 4's Motor: Bilateral upper extremities: 4 -/5 proximal distal, unchanged Bilateral lower extremities: 4/5 proximally, 3+ distally   Assessment/Plan: 1. 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  Physiatrist is providing close team supervision and 24 hour management of active medical problems listed below.  Physiatrist and rehab team continue to  assess barriers to discharge/monitor patient progress toward functional and medical goals  Care Tool:  Bathing    Body parts bathed by patient: Right arm, Left arm, Chest, Abdomen, Front perineal area, Buttocks, Right upper leg, Left upper leg, Right lower leg, Left lower leg, Face   Body parts bathed by helper: Buttocks, Left lower leg, Right lower leg     Bathing assist Assist Level: Minimal Assistance - Patient > 75%     Upper Body Dressing/Undressing Upper body dressing   What is the patient wearing?: Pull over shirt    Upper body assist Assist Level: Set up assist    Lower Body Dressing/Undressing Lower body dressing      What is the patient wearing?: Incontinence brief, Pants     Lower body assist Assist for lower body dressing: Moderate Assistance - Patient 50 - 74%     Toileting Toileting    Toileting assist Assist for toileting: Maximal Assistance - Patient 25 - 49%     Transfers Chair/bed transfer  Transfers assist     Chair/bed transfer assist level: Moderate Assistance - Patient 50 - 74%     Locomotion Ambulation   Ambulation assist   Ambulation activity did not occur: Safety/medical concerns  Assist level: Dependent - Patient 0% Assistive device: Walker-rolling Max distance: 20'   Walk 10 feet activity   Assist  Walk 10 feet activity did not occur: Safety/medical concerns  Assist level: Dependent - Patient 0% Assistive device: Maxi Sky, Walker-rolling   Walk 50 feet activity   Assist Walk 50 feet with 2 turns activity did not occur: Safety/medical concerns  Walk 150 feet activity   Assist Walk 150 feet activity did not occur: Safety/medical concerns         Walk 10 feet on uneven surface  activity   Assist Walk 10 feet on uneven surfaces activity did not occur: Safety/medical concerns         Wheelchair     Assist Will patient use wheelchair at discharge?: Yes Type of Wheelchair: Manual     Wheelchair assist level: Supervision/Verbal cueing Max wheelchair distance: 150'    Wheelchair 50 feet with 2 turns activity    Assist        Assist Level: Supervision/Verbal cueing   Wheelchair 150 feet activity     Assist      Assist Level: Supervision/Verbal cueing   Blood pressure (!) 133/93, pulse 73, temperature 98.4 F (36.9 C), temperature source Oral, resp. rate 18, height 5\' 11"  (1.803 m), weight 95.8 kg, SpO2 99 %.  Medical Problem List and Plan: 1.  Impaired mobility and ADLs secondary to Guillain Barre Syndrome with miller Fischer Variant.   Continue CIR 2.  Antithrombotics: -DVT/anticoagulation:  Pharmaceutical: Heparin  4/20- changed to lovenox- renal function OK             -antiplatelet therapy: N/a 3. Pain Management: Low back pain + neuropathic pain  Tylenol prn.  lyrica to 100mg  at noc, increased to twice daily on 5/2  Continue lidocaine patches and ointment  Increased duloxetine to 60 mg QHS- for nerve pain   5/7- worse again this AM- can increase Lyrica THIS WEEKEND to 200 mg TID_ increased Duloxetine to 90 mg daily  5/9 I asked the patient if he wanted to try more lyrica, and he said he did. Will increase to 200mg  tid  5/10- wants to stay at current dose- good- since at max dose.  4. Mood: LCSW to follow for evaluation and support.              -antipsychotic agents: N/a 5. Neuropsych: This patient is capable of making decisions on his own behalf. 6. Skin/Wound Care: Routine pressure relief measures.  7. Fluids/Electrolytes/Nutrition: Monitor I/Os.  8. HTN: Monitor BP tid--continue Hydralazine, metoprolol. Has been labile.   DC'd amlodipine  Vitals:   07/27/19 1906 07/28/19 0328  BP: 129/86 (!) 133/93  Pulse: (!) 103 73  Resp: 16 18  Temp: 98.7 F (37.1 C) 98.4 F (36.9 C)  SpO2: 96% 99%   Labile with diastolic elevation on 5/2, monitor trend  5/3- BP running 130s-140s/90s- will d/w team  5/4- BP 136/98 this AM and 122/84- will  restart Norvasc but only 2.5 mg daily and see how it goes.  5/5- stopped norvasc yesterday since has been having orthostatic hypotension - but BP running 148/100 when laying down- in 80s/50s when sitting/standing.   5/7- has orthostatic hypotension yesterday, wearing TEDs and abd binder but BP variable when laying down- can add midodrine, but would make supine HTN worse- will decrease meds    5/8-9 resting DBP high yesterday, improved today. No changes for now  5/10- Gets orthostatic 92/60 this AM with sitting, not even standing- asymptomatic this AM.  9. Hyponatremia: Has received Tolvaptan, fluid restriction, and salt tablets.   4/29- on 1500 cc fluid restriction  Sodium 132 on 4/26, labs ordered for tomorrow  5/3- Na 139- will stop sodium tablets 1 mg BID and monitor  5/5- labs in AM to f/u on Sodium- make sure not too low  5/6- Na 140- doing great-  fluid restriction decreased 10. H/o alcohol use: Continue Thiamine and Folic acid. boderline low Mg 1.7-1.8 range.  Added supplement.  4/22- had pt see Neuropsychology.   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   Hemoglobin 10.8 on 4/26  Continue to monitor 12. Abnormal LFTs:   Elevated on 4/20, labs ordered for tomorrow  5/4- LFTs much improved- in normal range now 13. Bladder incontinence/neurogenic bladder:   Improving 14. Dysarthria:   Stable 15. Insomnia: Continue Ambien prn  16. Neurogenic bowel/constipation- no control  Ordered Mg citrate and add Senokot 1 tab BID on 4/30  Improving  5/3- LBM Saturday but doesn't feel like needs to go- will discuss tomorrow   5/4- will order Sorbitol again and increase senokot to 2 tabs BID  5/5- will give Mg citrate - since hasn't gone  5/6- had good BM- will add Miralax if needed  5/10- give 1/2 dose mg citrate- since cannot take sorbitol home.  17. Cough/wheezing  5/7- got CXR which looks good- added nebs QID x2 days and then prn; cough meds and will  monitor  5/8-9 encouraging OOB, IS, mobilization of secretions  LOS: 21 days A FACE TO FACE EVALUATION WAS PERFORMED  Vashti Bolanos 07/28/2019, 8:55 AM

## 2019-07-28 NOTE — Progress Notes (Signed)
Occupational Therapy Session Note  Patient Details  Name: Jorge Weaver. MRN: [id number] Date of Birth: 1965/04/16  Today's Date: 07/28/2019 OT Individual Time: 1308-6578 OT Individual Time Calculation (min): 75 min    Short Term Goals: Week 3:  OT Short Term Goal 1 (Week 3): STG=LTG secondary to ELOS  Skilled Therapeutic Interventions/Progress Updates:    Pt resting in bed upon arrival and agreeable to getting OOB for shower. OT intervention with focus on bed mobility, sitting balance, sit<>stand, standing balance, functional transfers, BADL retraining, activity tolerance, and safety awareness to increase independence with BADLs. See Care Tool for assist levels with bathing/dressing tasks. Bed mobility with supervision using bed rails. Sitting balance with supervision but ongoing trunk ataxia noted. Sit<>stand and stand pivot transfer with min A. Pt completed bathing at shower level and returned to room for dressing with sit<>stand from w/c at sink. Pt with one short diaphoretic episode but quickly resolved. Pt with no c/o being "woozy" during session. BP reading as follows: Without [name] Supine-116/88 Seated EOB-110/85 Seated in w/c 108/76 After shower with [name] Seated-96/74 Seated-93/66  Pt remained seated in w/c with all needs within reach and belt alarm activated.  Therapy Documentation Precautions:  Precautions Precautions: Fall Precaution Comments: ataxia Restrictions Weight Bearing Restrictions: No  Pain: Pt c/o 6/10 B hand pain and tingling; emotional support and repositioned  Therapy/Group: Individual Therapy  [name] 07/28/2019, 9:30 AM

## 2019-07-29 ENCOUNTER — Inpatient Hospital Stay (HOSPITAL_COMMUNITY): Payer: BC Managed Care – PPO | Admitting: Physical Therapy

## 2019-07-29 ENCOUNTER — Inpatient Hospital Stay (HOSPITAL_COMMUNITY): Payer: BC Managed Care – PPO

## 2019-07-29 LAB — BASIC METABOLIC PANEL
Anion gap: 8 (ref 5–15)
BUN: 7 mg/dL (ref 6–20)
CO2: 29 mmol/L (ref 22–32)
Calcium: 9 mg/dL (ref 8.9–10.3)
Chloride: 103 mmol/L (ref 98–111)
Creatinine, Ser: 0.81 mg/dL (ref 0.61–1.24)
GFR calc Af Amer: >60 mL/min (ref >60)
GFR calc non Af Amer: >60 mL/min (ref >60)
Glucose, Bld: 126 mg/dL — ABNORMAL HIGH (ref 70–99)
Potassium: 4 mmol/L (ref 3.5–5.1)
Sodium: 140 mmol/L (ref 135–145)

## 2019-07-29 MED ORDER — METHOCARBAMOL 500 MG PO TABS
500.0000 mg | ORAL_TABLET | Freq: Three times a day (TID) | ORAL | Status: DC
  Administered 2019-07-29 – 2019-08-02 (×13): 500 mg via ORAL
  Filled 2019-07-29 (×13): qty 1

## 2019-07-29 NOTE — Progress Notes (Signed)
Occupational Therapy Weekly Progress Note  Patient Details  Name: Jorge Weaver. MRN: [id number] Date of Birth: [birth year]  Beginning of progress report period: Jul 21, 2019 End of progress report period: Jul 29, 2019  Pt progress with BADLs and functional transfers has been slow and inconsistent.  Pt experienced 3 days of increased lethargy and orthostatic BP during this past period which has deterred progress.  Pt continues to exhibit ongoing ataxia with sitting balance, functional tranfsers, and BUE function for BADLs. Pt is currently min/mod A for stand pivot/squat pivot transfers, sit<>stand, and standing balance. Pt is mod A for toileting and LB dressing tasks. LTG downgraded to min A overall.  Patient continues to demonstrate the following deficits: muscle weakness, decreased cardiorespiratoy endurance, impaired timing and sequencing, unbalanced muscle activation and decreased coordination and decreased standing balance, decreased postural control and decreased balance strategies and therefore will continue to benefit from skilled OT intervention to enhance overall performance with BADL and Reduce care partner burden.  Patient not progressing toward long term goals.  See goal revision..  Continue plan of care.  OT Short Term Goals Week 3:  OT Short Term Goal 1 (Week 3): STG=LTG secondary to ELOS OT Short Term Goal 1 - Progress (Week 3): Progressing toward goal Week 4:  OT Short Term Goal 1 (Week 4): STG=LTG 2/2 ELOS; LTG downgraded   [name] 07/29/2019, 6:32 AM

## 2019-07-29 NOTE — Progress Notes (Signed)
Social WorkTeam Conference Report to [employer] discussion was reviewed with the patient and caregiver, including goals, any changes in plan of care and target discharge date.  Patient and caregiver express understanding and are in agreement.  The patient has a target discharge date of 08/02/19. Sw provided spouse with Medicaid resources. SW in process of finding Home Health for patient, not guaranteed due to insurance. Patient has been turned down for all referrals. Sw waiting to hear follow up from other companies. Spouse anxious about discharge, SW followed up with spouse to answer any questions or concerns.  Jorge Weaver 07/29/2019, 1:28 PM  Patient ID: [name]., male   DOB: [birth year], 54 y.o.   MRN: [id number]

## 2019-07-29 NOTE — Progress Notes (Signed)
Occupational Therapy Session Note  Patient Details  Name: Jorge Weaver. MRN: [id number] Date of Birth: [birth year]  Today's Date: 07/29/2019 OT Individual Time: 0700-0800 OT Individual Time Calculation (min): 60 min    Short Term Goals: Week 4:  OT Short Term Goal 1 (Week 4): STG=LTG 2/2 ELOS; LTG downgraded  Skilled Therapeutic Interventions/Progress Updates:    Pt eating breakfast in bed upon arrival.  OT intervention with focus on bed mobility, sitting balance, functional transfers, BADL retraining, activity tolerance, standing balance, and safety awareness to increase indepndence with BADLs. See Care Tool for assist levels. Bed mobility and sitting balance with supervision. Squat pivot transfer to w/c with min A. Stand pivot transfers w/c<>TTB with min A. Pt completes bathing while seated on TTB using lateral leans to bathe buttocks. Sit<>stand at sink for LB dressing tasks with min A. Pt noted with hyperextended knees for stability. Pt c/o being "woozy" upon sitting EOB BP 130/104. No s/s throughout remainder of session.  Pt remained in w/c with all needs within reach and belt alarm activated.  Therapy Documentation Precautions:  Precautions Precautions: Fall Precaution Comments: ataxia Restrictions Weight Bearing Restrictions: No Pain:  Pt c/o ongoing pain in H hands; emotional support  Therapy/Group: Individual Therapy  [name] 07/29/2019, 10:01 AM

## 2019-07-29 NOTE — Progress Notes (Signed)
[place] PHYSICAL MEDICINE & REHABILITATION PROGRESS NOTE   Subjective/Complaints:   Pt reports 3 BMs in last 24 hours- feeling good bowel wise B/L shoulders bothering him- actually spasms so much, has "hit self in face'. Also they "pull up". Sleepy, but knows cannot go back to sleep.  Still coughing some- asking for cough meds- explained he has them- has to ask for them.   ROS:   Pt denies SOB, abd pain, CP, N/V/C/D, and vision changes  Objective:   No results found. Recent Labs    07/28/19 0521  WBC 5.4  HGB 10.6*  HCT 33.6*  PLT 293   Recent Labs    07/29/19 0503  NA 140  K 4.0  CL 103  CO2 29  GLUCOSE 126*  BUN 7  CREATININE 0.81  CALCIUM 9.0    Intake/Output Summary (Last 24 hours) at [facility] 0912 Last data filed at [facility] 0455 Gross per 24 hour  Intake 200 ml  Output 200 ml  Net 0 ml     Physical Exam: Vital Signs Blood pressure 121/84, pulse 81, temperature 97.8 F (36.6 C), temperature source Oral, resp. rate 18, height 5\' 11"  (1.803 m), weight 95.8 kg, SpO2 96 %. Constitutional: sitting up in manual w/c at bedside, appropriate, NAD HEENT: conjugate gaze- chronic facial sensation decreased Neck: supple Cardiovascular: RRR  Respiratory/Chest: a little coarse with 1-2 wheezes heard- better than last week- no cough heard GI/Abdomen: Soft, NT, ND, (+)BS  Ext: no clubbing, cyanosis, or edema Skin: Warm and dry.  Intact. Psych: appropriate;joking about getting out of safety belt Musc: No edema; but c/o muscle spasms in shoulders B/L- no hoffman's B/L Neuro: Alert. Distal sensory loss in all 4's Motor: Bilateral upper extremities: 4 -/5 proximal distal, unchanged Bilateral lower extremities: 4/5 proximally, 3+ distally   Assessment/Plan: 1. 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  Physiatrist is providing close team supervision and 24 hour management of active medical problems listed below.  Physiatrist and rehab team continue to assess barriers to discharge/monitor patient progress toward functional and medical goals  Care Tool:  Bathing    Body parts bathed by patient: Right arm, Left arm, Chest, Abdomen, Front perineal area, Buttocks, Right upper leg, Left upper leg, Right lower leg, Left lower leg, Face   Body parts bathed by helper: Buttocks, Left lower leg, Right lower leg     Bathing assist Assist Level: Contact Guard/Touching assist     Upper Body Dressing/Undressing Upper body dressing   What is the patient wearing?: Pull over shirt    Upper body assist Assist Level: Set up assist    Lower Body Dressing/Undressing Lower body dressing      What is the patient wearing?: Incontinence brief, Pants     Lower body assist Assist for lower body dressing: Minimal Assistance - Patient > 75%     Toileting Toileting    Toileting assist Assist for toileting: Maximal Assistance - Patient 25 - 49%     Transfers Chair/bed transfer  Transfers assist     Chair/bed transfer assist level: Moderate Assistance - Patient 50 - 74%     Locomotion Ambulation   Ambulation assist   Ambulation activity did not occur: Safety/medical concerns  Assist level: Dependent - Patient 0% Assistive device: Walker-rolling Max distance: 20'   Walk 10 feet activity   Assist  Walk 10 feet  activity did not occur: Safety/medical concerns  Assist level: Dependent - Patient 0% Assistive device: Maxi Sky, Walker-rolling   Walk 50 feet activity   Assist Walk 50 feet with 2 turns activity did not occur: Safety/medical concerns         Walk 150 feet activity   Assist Walk 150 feet activity did not occur: Safety/medical concerns         Walk 10 feet on uneven surface  activity   Assist Walk 10 feet on uneven surfaces activity did not  occur: Safety/medical concerns         Wheelchair     Assist Will patient use wheelchair at discharge?: Yes Type of Wheelchair: Manual    Wheelchair assist level: Supervision/Verbal cueing Max wheelchair distance: 150'    Wheelchair 50 feet with 2 turns activity    Assist        Assist Level: Supervision/Verbal cueing   Wheelchair 150 feet activity     Assist      Assist Level: Supervision/Verbal cueing   Blood pressure 121/84, pulse 81, temperature 97.8 F (36.6 C), temperature source Oral, resp. rate 18, height 5\' 11"  (1.803 m), weight 95.8 kg, SpO2 96 %.  Medical Problem List and Plan: 1.  Impaired mobility and ADLs secondary to Guillain Barre Syndrome with miller Fischer Variant.   Continue CIR 2.  Antithrombotics: -DVT/anticoagulation:  Pharmaceutical: Heparin  4/20- changed to lovenox- renal function OK             -antiplatelet therapy: N/a 3. Pain Management: Low back pain + neuropathic pain  Tylenol prn.  lyrica to 100mg  at noc, increased to twice daily on 5/2  Continue lidocaine patches and ointment  Increased duloxetine to 60 mg QHS- for nerve pain   5/7- worse again this Weaver- can increase Lyrica THIS WEEKEND to 200 mg TID_ increased Duloxetine to 90 mg daily  5/9 I asked the patient if he wanted to try more lyrica, and he said he did. Will increase to 200mg  tid  5/10- wants to stay at current dose- good- since at max dose.  4. Mood: LCSW to follow for evaluation and support.              -antipsychotic agents: N/a 5. Neuropsych: This patient is capable of making decisions on his own behalf. 6. Skin/Wound Care: Routine pressure relief measures.  7. Fluids/Electrolytes/Nutrition: Monitor I/Os.  8. HTN: Monitor BP tid--continue Hydralazine, metoprolol. Has been labile.   DC'd amlodipine  Vitals:   07/29/19 0553 07/29/19 0852  BP: 117/80 121/84  Pulse: 76 81  Resp:    Temp:    SpO2:        5/8-9 resting DBP high yesterday, improved  today. No changes for now  5/10- Gets orthostatic 92/60 this Weaver with sitting, not even standing- asymptomatic this Weaver.   5/11- 129/84- improved, but when gets up is orthostatic-  9. Hyponatremia: Has received Tolvaptan, fluid restriction, and salt tablets.   4/29- on 1500 cc fluid restriction  Sodium 132 on 4/26, labs ordered for tomorrow  5/3- Na 139- will stop sodium tablets 1 mg BID and monitor  5/5- labs in [facility] to f/u on Sodium- make sure not too low  5/11- Na 140 still- off salt tabs and fluid restriction 10. H/o alcohol use: Continue Thiamine and Folic acid. boderline low Mg 1.7-1.8 range.  Added supplement.  4/22- had pt see Neuropsychology.   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  Hemoglobin 10.8 on 4/26  Continue to monitor 12. Abnormal LFTs:   Elevated on 4/20, labs ordered for tomorrow  5/4- LFTs much improved- in normal range now 13. Bladder incontinence/neurogenic bladder:   Improving 14. Dysarthria:   Stable 15. Insomnia: Continue Ambien prn  16. Neurogenic bowel/constipation- no control  Ordered Mg citrate and add Senokot 1 tab BID on 4/30  Improving  5/3- LBM Saturday but doesn't feel like needs to go- will discuss tomorrow   5/4- will order Sorbitol again and increase senokot to 2 tabs BID  5/5- will give Mg citrate - since hasn't gone  5/6- had good BM- will add Miralax if needed  5/10- give 1/2 dose mg citrate- since cannot take sorbitol home.   5/11- 3 BMs yesterday 17. Cough/wheezing  5/7- got CXR which looks good- added nebs QID x2 days and then prn; cough meds and will monitor  5/8-9 encouraging OOB, IS, mobilization of secretions  5/11- needs flutter valve and ICS since still coarse- cough meds prn 18. Muscle spasms  5/11- will schedule robaxin per pt request-   LOS: 22 days A FACE TO FACE EVALUATION WAS PERFORMED  Jorge Weaver, Jorge Weaver

## 2019-07-29 NOTE — Plan of Care (Signed)
  Problem: RH Bed to Chair Transfers Goal: LTG Patient will perform bed/chair transfers w/assist (PT) Description: LTG: Patient will perform bed to chair transfers with assistance (PT). Flowsheets (Taken 07/29/2019 1247) LTG: Pt will perform Bed to Chair Transfers with assistance level: (downgrade due to slow progress) Minimal Assistance - Patient > 75% Note: Downgrade due to slow progress   Problem: RH Ambulation Goal: LTG Patient will ambulate in controlled environment (PT) Description: LTG: Patient will ambulate in a controlled environment, # of feet with assistance (PT). Flowsheets (Taken 07/29/2019 1247) LTG: Pt will ambulate in controlled environ  assist needed:: (downgrade due to slow progress) Dependent - mechanical lift Note: Downgrade due to slow progress

## 2019-07-29 NOTE — Progress Notes (Signed)
Physical Therapy Session Note  Patient Details  Name: Jorge Weaver. MRN: [id number] Date of Birth: 1966-01-16  Today's Date: 07/29/2019 PT Individual Time: 1400-1458 PT Individual Time Calculation (min): 58 min   Short Term Goals: Week 4:  PT Short Term Goal 1 (Week 4): =LTG due to ELOS  Skilled Therapeutic Interventions/Progress Updates:   Pt missed 17 min of skilled PT as he was w/ dietary. Pt in w/c and agreeable to therapy, reports pain in low back but does not rate. Total assist w/c transport to therapy gym. Worked on pre-gait and gait training tasks. Sit>stands to RW w/ min assist, performed stepping in place x10 reps. Seated rest before standing again and ambulating 10' x2 and 20' x2 w/ very close w/c follow and min assist for upright balance. Mod-max assist to safely sit in w/c when 1 knee would buckle. Worked on BLE strengthening in standing w/ RW support. Performed partial knee bends 3x5 and knee marches 2x5 w/ min assist and verbal/visual cues for technique. Sit<>stands faded to CGA throughout session w/ repetition. Seated rest breaks throughout session 2/2 muscular fatigue and poor muscular endurance. Pt self-propelled w/c w/ BLEs x100' w/ min assist to work on BLE strengthening in reciprocal movement pattern, total assist remainder of way 2/2 fatigue. Stand pivot to EOB w/ min assist using RW, verbal cues for RW management. Ended session in supine, all needs in reach.   Therapy Documentation Precautions:  Precautions Precautions: Fall Precaution Comments: ataxia Restrictions Weight Bearing Restrictions: No Vital Signs: Therapy Vitals Temp: 98.4 F (36.9 C) Pulse Rate: 68 Resp: 14 BP: 106/78 Patient Position (if appropriate): Sitting Oxygen Therapy SpO2: 98 % O2 Device: Room Air  Therapy/Group: Individual Therapy  Shellie Goettl Melton Krebs 07/29/2019, 2:59 PM

## 2019-07-29 NOTE — Patient Care Conference (Signed)
Inpatient RehabilitationTeam Conference and Plan of Care Update Date: 07/29/2019   Time: 1:35 PM    Patient Name: Jorge Weaver.      Medical Record Number: [id number]  Date of Birth: 13-Nov-1965 Sex: Male         Room/Bed: 4W01C/4W01C-01 Payor Info: Payor: [place] / Plan: BCBS COMM PPO / Product Type: *No Product type* /    Admit Date/Time:  07/07/2019  5:13 PM  Primary Diagnosis:  GBS (Guillain-Barre syndrome) ([place])  Patient Active Problem List   Diagnosis Date Noted  . Neuropathic pain   . Neurogenic bowel   . Neurogenic bladder   . Transaminitis   . Acute blood loss anemia   . Hyponatremia   . Labile blood pressure   . GBS (Guillain-Barre syndrome) ([place]) 07/07/2019  . Hypokalemia 06/28/2019  . Weakness 06/27/2019  . Cervical spondylosis with myelopathy and radiculopathy 01/16/2019  . Spinal stenosis of lumbar region 12/24/2018  . Gastroesophageal reflux disease 06/26/2018  . Essential hypertension 06/17/2018  . Fatty liver   . Witnessed apneic spells 03/28/2017  . Prediabetes 01/18/2017  . Bronchitis 05/21/2014  . ETOH abuse 05/21/2014  . Asthma 01/15/2013  . Asthma with exacerbation 01/15/2013  . Generalized anxiety disorder 08/24/2012  . Malaise and fatigue 08/24/2012    Expected Discharge Date: Expected Discharge Date: 08/02/19  Team Members Present: Physician leading conference: Dr. Courtney Heys Care Coodinator Present: Loralee Pacas, LCSWA;Christina Sampson Goon, BSW Nurse Present: Dorthula Nettles, RN;Karen Lovena Neighbours, RN PT Present: Excell Seltzer, PT OT Present: Roanna Epley, COTA SLP Present: Weston Anna, SLP PPS Coordinator present : Ileana Ladd, Burna Mortimer, SLP     Current Status/Progress Goal Weekly Team Focus  Bowel/Bladder   Pt is continent of B/B. LBM 07/27/19  To remain continent of bowel/bladder.  PRN/Q2h toileting. Assess BM needs as needed   Swallow/Nutrition/ Hydration             ADL's   bathing-min A at shower  level; LB dressing-mod A; functional tranfsers-min A; toileting-mod A; standing balance-min A  bathing at shower level-supervision; UB dressing-supervision; functional tranfsers, toileting, LB dressing-min A (downgraded)  transfers, activity tolerance, standing balance, BUE strengthening/coordination, family education   Mobility   min A bed mobility, min/mod squat pivot transfers, mod A to stand to RW, Supervision w/c mobility, gait in maxisky x 20 ft with RW  min A overall  LE NMR, pre gait and gait training, family edu, d/c planning   Communication             Safety/Cognition/ Behavioral Observations            Pain   Pt c/o bilateral hand and lower back pain. PRN tramadol effective  Pain will be below 3  Assess pain Qshift/PRN   Skin   Skin in tact, no obvious sing of infection or breakdown  Prevent any skin breakdown from occuring  Assess skin qshift/prn    Rehab Goals Patient on target to meet rehab goals: Yes Rehab Goals Revised: Patient on target with goals *See Care Plan and progress notes for long and short-term goals.     Barriers to Discharge  Current Status/Progress Possible Resolutions Date Resolved   Nursing                  PT  Decreased caregiver support;Medical stability;Home environment access/layout;Incontinence  9 STE (split level home) and pt will not be able to perform stairs upon d/c  OT                  SLP                SW Home environment access/layout Spouse still concerned about hill outside home. On target to discharge          Discharge Planning/Teaching Needs:  Patient discharging home with [facility] on 08/02/19  Family education: 07/31/19   Team Discussion: N/A   Revisions to Treatment Plan: N/A     Medical Summary Current Status: Ox3; BM x3 yesterday; orthostatic hypotension- continent; no skin issues Weekly Focus/Goal: lethargy better somewhat; min assist bed; squat pivot min assist; stand pivot transfers today; W/C; gait maxisky  because legs give out  Barriers to Discharge: Decreased family/caregiver support;Home enviroment access/layout;Other (comments);Weight bearing restrictions  Barriers to Discharge Comments: split level home- 9 stairs in home Possible Resolutions to Barriers: OT- last couple of days better orthostasis; bathing LB supervisison/CGA. downgraded some goals to min A   Continued Need for Acute Rehabilitation Level of Care: The patient requires daily medical management by a physician with specialized training in physical medicine and rehabilitation for the following reasons: Direction of a multidisciplinary physical rehabilitation program to maximize functional independence : Yes Medical management of patient stability for increased activity during participation in an intensive rehabilitation regime.: Yes Analysis of laboratory values and/or radiology reports with any subsequent need for medication adjustment and/or medical intervention. : Yes   I attest that I was present, lead the team conference, and concur with the assessment and plan of the team.   Tennis Must 07/29/2019, 1:35 PM

## 2019-07-29 NOTE — Progress Notes (Signed)
Physical Therapy Weekly Progress Note  Patient Details  Name: Jorge Weaver. MRN: [id number] Date of Birth: Aug 02, 1965  Beginning of progress report period: Jul 22, 2019 End of progress report period: Jul 29, 2019  Today's Date: 07/29/2019 PT Individual Time: 0900-1000 PT Individual Time Calculation (min): 60 min   Patient has met 2 of 3 short term goals.  Pt currently requires Supervision to min A for bed mobility, is min to mod A for squat pivot transfers, is able to stand to RW with mod A, and can perform w/c mobility at Supervision level with use of BUE. Pt is able to perform gait dependently with use of RW while in overhead body weight supported system of Maxi [place]. Pt is making slow progress towards LTG due to ongoing issues with hypotension and lethargy intermittently limiting his ability to functionally participate in therapy sessions at times.  Patient continues to demonstrate the following deficits muscle weakness, decreased cardiorespiratoy endurance, abnormal tone, unbalanced muscle activation, ataxia and decreased coordination and decreased sitting balance, decreased standing balance, decreased postural control and decreased balance strategies and therefore will continue to benefit from skilled PT intervention to increase functional independence with mobility.  Patient not progressing toward long term goals.  See goal revision..  Plan of care revisions: downgraded transfer goal to min A and gait goal to dependent with use of mechanical lift due to slow progress..  PT Short Term Goals  Week 3:  PT Short Term Goal 1 (Week 3): Pt will maintain dynamic sitting balance at Supervision level PT Short Term Goal 1 - Progress (Week 3): Met PT Short Term Goal 2 (Week 3): Pt will perform bed mobility at Supervision level consistently PT Short Term Goal 2 - Progress (Week 3): Progressing toward goal PT Short Term Goal 3 (Week 3): Pt will ambulate x 10 ft with LRAD PT Short Term Goal 3 -  Progress (Week 3): Met Week 4:  PT Short Term Goal 1 (Week 4): =LTG due to ELOS   Skilled Therapeutic Interventions/Progress Updates:    Pt received seated in w/c in room, agreeable to PT session. Pt reports ongoing nerve pain in hands, RN provides numbing cream to hands at beginning of therapy session. Dependent transport via w/c to/from therapy gym for time conservation. Pt's daughter present during session and able to participate in hands-on family education. Demonstrated how to safely setup and assist pt with squat pivot transfers w/c to/from mat table. Pt is min A for transfer. Pt's daughter is then able to perform return demo with min cueing to safely assist pt with transfer. Per pt and his daughter's report his bed height is similar in height to w/c seat height. Demonstrated how to safely assist pt in/out of car at simulation height of [name] which is car pt will d/c home. Pt is max A for a squat pivot transfer w/c to/from car due to seat height difference and mod A to get BLE in/out of car. Trial stand pivot transfer with RW and min to mod A. Pt is then able to perform car transfer via SPT with use of RW and min A overall. Pt's daughter able to perform return demo to assist pt with this type of transfer. Again discussed therapy's concerns with patient entering his home due to having 9 stairs inside. Again discussed options for stair management with patient and his daughter who also expressed concerns about stairs. Pt requests to return to bed at end of session. Squat pivot transfer with min  A. Sit to supine Supervision. Pt left seated in bed with needs in reach, bed alarm in place at end of session.  Therapy Documentation Precautions:  Precautions Precautions: Fall Precaution Comments: ataxia Restrictions Weight Bearing Restrictions: No   Therapy/Group: Individual Therapy   Jorge Weaver, PT, DPT  07/29/2019, 12:31 PM

## 2019-07-29 NOTE — Plan of Care (Signed)
  Problem: RH Balance Goal: LTG Patient will maintain dynamic standing with ADLs (OT) Description: LTG:  Patient will maintain dynamic standing balance with assist during activities of daily living (OT)  Flowsheets (Taken 07/29/2019 1614) LTG: Pt will maintain dynamic standing balance during ADLs with: (downgraded JLS) Minimal Assistance - Patient > 75% Note: downgraded JLS   Problem: Sit to Stand Goal: LTG:  Patient will perform sit to stand in prep for activites of daily living with assistance level (OT) Description: LTG:  Patient will perform sit to stand in prep for activites of daily living with assistance level (OT) Flowsheets (Taken 07/29/2019 1614) LTG: PT will perform sit to stand in prep for activites of daily living with assistance level: (downgraded JLS) Minimal Assistance - Patient > 75% Note: downgraded JLS   Problem: RH Dressing Goal: LTG Patient will perform lower body dressing w/assist (OT) Description: LTG: Patient will perform lower body dressing with assist, with/without cues in positioning using equipment (OT) Flowsheets (Taken 07/29/2019 1614) LTG: Pt will perform lower body dressing with assistance level of: (downgraded JLS) Minimal Assistance - Patient > 75% Note: downgraded JLS   Problem: RH Toileting Goal: LTG Patient will perform toileting task (3/3 steps) with assistance level (OT) Description: LTG: Patient will perform toileting task (3/3 steps) with assistance level (OT)  Flowsheets (Taken 07/29/2019 1614) LTG: Pt will perform toileting task (3/3 steps) with assistance level: (downgraded JLS) Minimal Assistance - Patient > 75% Note: downgraded JLS   Problem: RH Toilet Transfers Goal: LTG Patient will perform toilet transfers w/assist (OT) Description: LTG: Patient will perform toilet transfers with assist, with/without cues using equipment (OT) Flowsheets (Taken 07/29/2019 1614) LTG: Pt will perform toilet transfers with assistance level of: (downgraded JLS)  Minimal Assistance - Patient > 75% Note: downgraded JLS   Problem: RH Tub/Shower Transfers Goal: LTG Patient will perform tub/shower transfers w/assist (OT) Description: LTG: Patient will perform tub/shower transfers with assist, with/without cues using equipment (OT) Flowsheets (Taken 07/29/2019 1614) LTG: Pt will perform tub/shower stall transfers with assistance level of: Minimal Assistance - Patient > 75% LTG: Pt will perform tub/shower transfers from: (downgraded JLS) -- Note: downgraded JLS

## 2019-07-30 ENCOUNTER — Inpatient Hospital Stay (HOSPITAL_COMMUNITY): Payer: BC Managed Care – PPO | Admitting: *Deleted

## 2019-07-30 ENCOUNTER — Inpatient Hospital Stay (HOSPITAL_COMMUNITY): Payer: BC Managed Care – PPO | Admitting: Physical Therapy

## 2019-07-30 ENCOUNTER — Inpatient Hospital Stay (HOSPITAL_COMMUNITY): Payer: BC Managed Care – PPO

## 2019-07-30 MED ORDER — DICLOFENAC SODIUM 1 % EX GEL
2.0000 g | Freq: Two times a day (BID) | CUTANEOUS | Status: DC
  Administered 2019-07-30 – 2019-08-01 (×5): 2 g via TOPICAL
  Filled 2019-07-30: qty 100

## 2019-07-30 MED ORDER — SENNA 8.6 MG PO TABS
1.0000 | ORAL_TABLET | Freq: Two times a day (BID) | ORAL | Status: DC
  Administered 2019-07-30 – 2019-08-02 (×6): 8.6 mg via ORAL
  Filled 2019-07-30 (×6): qty 1

## 2019-07-30 NOTE — Progress Notes (Signed)
Physical Therapy Session Note  Patient Details  Name: Jorge Weaver. MRN: [id number] Date of Birth: 07-24-1965  Today's Date: 07/30/2019 PT Individual Time: 0900-1000; 1300-1400 PT Individual Time Calculation (min): 60 min and 60 min  Short Term Goals: Week 4:  PT Short Term Goal 1 (Week 4): =LTG due to ELOS  Skilled Therapeutic Interventions/Progress Updates:    Session 1: Pt received seated in w/c in room, agreeable to PT session. Pt reports ongoing nerve pain in B hands, just had numbing cream applied prior to session. Dependent transport via w/c to/from therapy gym for time conservation. Squat pivot transfer to mat table with min A. Session focus on BLE NMR in standing with RW while listening to preferred music for improved mood. Pt is overall min A to stand to RW with occasional mod A or +2 needed due to LE fatigue. Once in standing pt requires max cueing for upright posture due to hip and trunk flexion. Use of mirror for visual feedback for upright stance, improved with use of visual cues. Pt tends to stand with B knees locked into extension in standing, attempted to have him unlock knees but he is unable to maintain upright stance. Pt also tends to lean to the R and not WB through LLE in standing. Pt is able to perform alt L/R marches x 10 reps with RW and mod to max A for standing balance, max cues for LE placement due to impaired proprioception. Pt requests to return to bed at end of session. Squat pivot transfer back to bed with min A. Sit to supine Supervision. Pt left semi-reclined in bed with needs in reach, bed alarm in place at end of session. Cotreatment session with LRT.   Session 2: Pt received seated in bed, agreeable to PT session. Pt's wife Jorge Weaver present and able to participate in hands-on family education and training. Bed mobility Supervision. Squat pivot transfer to w/c with min A. Dependent transport via w/c to/from therapy gym for time conservation. Demonstration of how  to assist pt with squat pivot transfers and provide min A. Pt's wife is then able to perform return demo of assisting with transfer with min cueing. Stand pivot transfer w/c to/from mat table with min A with cues for LE and safe RW management. Pt's wife is able to perform return demo of assisting with SPT with min cueing for safety. Overall pt's wife demonstrates good understanding of how to assist pt with transfers at home. Reviewed management of w/c parts, that pt is not to perform ambulation at home without therapy present, BP management, etc. Also reviewed stair management techniques. Pt's wife reports she will have 3 extra family members present upon d/c home to assist pt with bumping w/c up/down stairs. Pt agreeable to stay seated in w/c at end of session. Pt left seated in w/c in room with needs in reach, quick release belt and chair alarm in place, wife present at end of session.  Therapy Documentation Precautions:  Precautions Precautions: Fall Precaution Comments: ataxia Restrictions Weight Bearing Restrictions: No    Therapy/Group: Individual Therapy   Peter Congo, PT, DPT  07/30/2019, 12:35 PM

## 2019-07-30 NOTE — Progress Notes (Signed)
Verbal order given from [facility] PA,  For Topical Voltaren Gel to Bilateral Arms

## 2019-07-30 NOTE — Progress Notes (Signed)
Recreational Therapy Session Note  Patient Details  Name: [name]. MRN: [id number] Date of Birth: 08/31/1965 Today's Date: 07/30/2019 Time:  9-957 Pain: c/o hand burning intermittently, soreness in pecs Skilled Therapeutic Interventions/Progress Updates: Session focused activity tolerance, dynamic standing balance, sitting balance, squat pivot transfers during co-treat with PT.  Pt taken to the therapy gym total assist w/c level.  Once in the gym, pt performed squat pivot transfers with min-mod assist.   Pt stood with RW with min-mod assist for balance shifting his weight and moving to the music.  Pt with difficulty with R knee control as he tends to stand with R knee locked during activity or knee buckling when attempting to unlock it.  Pt required min instructional cues throughout session for posture and LE placement.   Therapy/Group: Co-Treatment Velvie [place]aston 07/30/2019, 10:03 AM

## 2019-07-30 NOTE — Progress Notes (Signed)
Pt requested a topical muscle rub for his arms. Pt stated the provider as going to order something for him. Provider will be made aware during morning rounds.

## 2019-07-30 NOTE — Plan of Care (Signed)
  Problem: Consults Goal: RH SPINAL CORD INJURY PATIENT EDUCATION Description:  See Patient Education module for education specifics.  Outcome: Progressing Goal: Skin Care Protocol Initiated - if Braden Score 18 or less Description: If consults are not indicated, leave blank or document N/A Outcome: Progressing Goal: Nutrition Consult-if indicated Outcome: Progressing   Problem: SCI BOWEL ELIMINATION Goal: RH STG MANAGE BOWEL WITH ASSISTANCE Description: STG Manage Bowel with mod I Assistance. Outcome: Progressing Goal: RH STG MANAGE BOWEL W/EQUIPMENT W/ASSISTANCE Description: STG Manage Bowel With Equipment With Assistance Outcome: Progressing   Problem: SCI BLADDER ELIMINATION Goal: RH STG MANAGE BLADDER WITH ASSISTANCE Description: STG Manage Bladder With mod I Assistance Outcome: Progressing Goal: RH OTHER STG BLADDER ELIMINATION GOALS W/ASSIST Description: Other STG Bladder Elimination Goals With mod I Assistance Outcome: Progressing   Problem: RH SKIN INTEGRITY Goal: RH STG SKIN FREE OF INFECTION/BREAKDOWN Description: Patient will not acquire any skin break down while on IP rehab Outcome: Progressing Goal: RH STG MAINTAIN SKIN INTEGRITY WITH ASSISTANCE Description: STG Maintain Skin Integrity With mod I Assistance. Outcome: Progressing Goal: RH STG ABLE TO PERFORM INCISION/WOUND CARE W/ASSISTANCE Description: STG Able To Perform Incision/Wound Care With mod I Assistance. Outcome: Progressing   Problem: RH SAFETY Goal: RH STG ADHERE TO SAFETY PRECAUTIONS W/ASSISTANCE/DEVICE Description: STG Adhere to Safety Precautions With mod I Assistance/Device. Outcome: Progressing   Problem: RH PAIN MANAGEMENT Goal: RH STG PAIN MANAGED AT OR BELOW PT'S PAIN GOAL Description: Pain scale <4/10 Outcome: Progressing   Problem: RH KNOWLEDGE DEFICIT SCI Goal: RH STG INCREASE KNOWLEDGE OF SELF CARE AFTER SCI Description: Patient will be able to read back instructions on self care,  his condition & preventative measures by discharge Outcome: Progressing   

## 2019-07-30 NOTE — Progress Notes (Signed)
[place] PHYSICAL MEDICINE & REHABILITATION PROGRESS NOTE   Subjective/Complaints:   Pt reports no improvement with muscle tightness in pecs, shoulders.  "robaxin not working".  LBM yesterday- very loose. .   ROS:   Pt denies SOB, abd pain, CP, N/V/C/D, and vision changes  Objective:   No results found. Recent Labs    07/28/19 0521  WBC 5.4  HGB 10.6*  HCT 33.6*  PLT 293   Recent Labs    07/29/19 0503  NA 140  K 4.0  CL 103  CO2 29  GLUCOSE 126*  BUN 7  CREATININE 0.81  CALCIUM 9.0    Intake/Output Summary (Last 24 hours) at 07/30/2019 0845 Last data filed at 07/30/2019 0247 Gross per 24 hour  Intake 720 ml  Output 350 ml  Net 370 ml     Physical Exam: Vital Signs Blood pressure 103/72, pulse 79, temperature 99 F (37.2 C), resp. rate 18, height 5\' 11"  (1.803 m), weight 95.8 kg, SpO2 98 %. Constitutional: awake, sitting up in bedside chair, OT in room; rubbing pecs, NAD HEENT: conjugate gaze- chronic facial sensation decreased Neck: supple Cardiovascular: RRR Respiratory/Chest: still a little coarse- but better GI/Abdomen: Soft, NT, ND, (+)BS  Ext: no clubbing, cyanosis, or edema Skin: Warm and dry.  Intact. Psych: appropriate Musc: No edema; tight pecs, upper traps, and levators- not scalenes; - no hoffman's B/L Neuro: Alert. Distal sensory loss in all 4's Motor: Bilateral upper extremities: 4 -/5 proximal distal, unchanged Bilateral lower extremities: 4/5 proximally, 3+ distally   Assessment/Plan: 1. 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  Physiatrist is providing close team supervision and 24 hour management of active medical problems listed below.  Physiatrist and rehab team continue to assess barriers to discharge/monitor patient progress toward functional and medical  goals  Care Tool:  Bathing    Body parts bathed by patient: Right arm, Left arm, Chest, Abdomen, Front perineal area, Buttocks, Right upper leg, Left upper leg, Right lower leg, Left lower leg, Face   Body parts bathed by helper: Buttocks, Left lower leg, Right lower leg     Bathing assist Assist Level: Contact Guard/Touching assist     Upper Body Dressing/Undressing Upper body dressing   What is the patient wearing?: Pull over shirt    Upper body assist Assist Level: Set up assist    Lower Body Dressing/Undressing Lower body dressing      What is the patient wearing?: Pants, Incontinence brief     Lower body assist Assist for lower body dressing: Minimal Assistance - Patient > 75%     Toileting Toileting    Toileting assist Assist for toileting: Maximal Assistance - Patient 25 - 49%     Transfers Chair/bed transfer  Transfers assist     Chair/bed transfer assist level: Minimal Assistance - Patient > 75%     Locomotion Ambulation   Ambulation assist   Ambulation activity did not occur: Safety/medical concerns  Assist level: Minimal Assistance - Patient > 75% Assistive device: Walker-rolling Max distance: 20'   Walk 10 feet activity   Assist  Walk 10 feet activity did not occur: Safety/medical concerns  Assist level: Minimal Assistance - Patient > 75% Assistive device: Walker-rolling   Walk 50 feet activity   Assist Walk 50 feet with 2 turns activity did not occur: Safety/medical concerns  Walk 150 feet activity   Assist Walk 150 feet activity did not occur: Safety/medical concerns         Walk 10 feet on uneven surface  activity   Assist Walk 10 feet on uneven surfaces activity did not occur: Safety/medical concerns         Wheelchair     Assist Will patient use wheelchair at discharge?: Yes Type of Wheelchair: Manual    Wheelchair assist level: Supervision/Verbal cueing Max wheelchair distance: 150'     Wheelchair 50 feet with 2 turns activity    Assist        Assist Level: Supervision/Verbal cueing   Wheelchair 150 feet activity     Assist      Assist Level: Supervision/Verbal cueing   Blood pressure 103/72, pulse 79, temperature 99 F (37.2 C), resp. rate 18, height 5\' 11"  (1.803 m), weight 95.8 kg, SpO2 98 %.  Medical Problem List and Plan: 1.  Impaired mobility and ADLs secondary to Guillain Barre Syndrome with miller Fischer Variant.   Continue CIR 2.  Antithrombotics: -DVT/anticoagulation:  Pharmaceutical: Heparin  4/20- changed to lovenox- renal function OK             -antiplatelet therapy: N/a 3. Pain Management: Low back pain + neuropathic pain  Tylenol prn.  lyrica to 100mg  at noc, increased to twice daily on 5/2  Continue lidocaine patches and ointment  Increased duloxetine to 60 mg QHS- for nerve pain   5/7- worse again this AM- can increase Lyrica THIS WEEKEND to 200 mg TID_ increased Duloxetine to 90 mg daily  5/9 I asked the patient if he wanted to try more lyrica, and he said he did. Will increase to 200mg  tid  5/10- wants to stay at current dose- good- since at max dose.   5/12- nerve pain doing better 4. Mood: LCSW to follow for evaluation and support.              -antipsychotic agents: N/a 5. Neuropsych: This patient is capable of making decisions on his own behalf. 6. Skin/Wound Care: Routine pressure relief measures.  7. Fluids/Electrolytes/Nutrition: Monitor I/Os.  8. HTN: Monitor BP tid--continue Hydralazine, metoprolol. Has been labile.   DC'd amlodipine  Vitals:   07/30/19 0436 07/30/19 0821  BP: 110/85 103/72  Pulse: 81 79  Resp: 18   Temp: 99 F (37.2 C)   SpO2: 98%       5/8-9 resting DBP high yesterday, improved today. No changes for now  5/10- Gets orthostatic 92/60 this AM with sitting, not even standing- asymptomatic this AM.   5/11- 129/84- improved, but when gets up is orthostatic-   5/12- no orthostasis this AM  per OT- con't regimen 9. Hyponatremia: Has received Tolvaptan, fluid restriction, and salt tablets.   4/29- on 1500 cc fluid restriction  Sodium 132 on 4/26, labs ordered for tomorrow  5/3- Na 139- will stop sodium tablets 1 mg BID and monitor  5/5- labs in AM to f/u on Sodium- make sure not too low  5/11- Na 140 still- off salt tabs and fluid restriction 10. H/o alcohol use: Continue Thiamine and Folic acid. boderline low Mg 1.7-1.8 range.  Added supplement.  4/22- had pt see Neuropsychology.   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   Hemoglobin 10.8 on 4/26  Continue to monitor 12. Abnormal LFTs:   Elevated on 4/20, labs ordered for tomorrow  5/4- LFTs much improved- in normal range now 13.  Bladder incontinence/neurogenic bladder:   Improving 14. Dysarthria:   Stable 15. Insomnia: Continue Ambien prn  16. Neurogenic bowel/constipation- no control  Ordered Mg citrate and add Senokot 1 tab BID on 4/30  Improving  5/3- LBM Saturday but doesn't feel like needs to go- will discuss tomorrow   5/4- will order Sorbitol again and increase senokot to 2 tabs BID  5/5- will give Mg citrate - since hasn't gone  5/6- had good BM- will add Miralax if needed  5/10- give 1/2 dose mg citrate- since cannot take sorbitol home.   5/11- 3 BMs yesterday  5/12- Loose stools yesterday- wants ot decrease bowel meds 17. Cough/wheezing  5/7- got CXR which looks good- added nebs QID x2 days and then prn; cough meds and will monitor  5/8-9 encouraging OOB, IS, mobilization of secretions  5/11- needs flutter valve and ICS since still coarse- cough meds prn 18. Muscle spasms  5/11- will schedule robaxin per pt request-  5/12- asked Tom- OT to do myofascial release.    LOS: 23 days A FACE TO FACE EVALUATION WAS PERFORMED  Jorge Weaver 07/30/2019, 8:45 AM

## 2019-07-30 NOTE — Progress Notes (Signed)
Occupational Therapy Session Note  Patient Details  Name: Damario Gillie. MRN: [id number] Date of Birth: 1965-06-13  Today's Date: 07/30/2019 OT Individual Time: 0700-0810 OT Individual Time Calculation (min): 70 min    Short Term Goals: Week 4:  OT Short Term Goal 1 (Week 4): STG=LTG 2/2 ELOS; LTG downgraded  Skilled Therapeutic Interventions/Progress Updates:    OT intervention with focus on bed mobility, sit<>stand, standing balance, BADL retraining, activity tolerance, and safety awareness to increase independence with BADLs. BP 130/100 sitting EOB.  Pt with no s/s throughout session. Squat pivot and stand pivot transfers with min A. Bathing at shower level with supervision/CGA for lateral leans. LB dressing with min A for standing balance when pulling pants over hips.  Min verbal cues for safety when standing. Pt requires more then a reasonable amount of time to complete tasks.  Pt's ataxia improving but continues to demonstrate decreased gross motor coordination with all tasks. Pt remained seated in w/c with belt alarm activated and all needs within reach, eating breakfast.   Therapy Documentation Precautions:  Precautions Precautions: Fall Precaution Comments: ataxia Restrictions Weight Bearing Restrictions: No Pain: Pt c/o ongoing B hand pain and B pectoralis   Therapy/Group: Individual Therapy  [name] 07/30/2019, 8:15 AM

## 2019-07-31 ENCOUNTER — Ambulatory Visit (HOSPITAL_COMMUNITY): Payer: BC Managed Care – PPO | Admitting: Physical Therapy

## 2019-07-31 ENCOUNTER — Inpatient Hospital Stay (HOSPITAL_COMMUNITY): Payer: BC Managed Care – PPO

## 2019-07-31 NOTE — Plan of Care (Signed)
  Problem: SCI BOWEL ELIMINATION Goal: RH STG MANAGE BOWEL WITH ASSISTANCE Description: STG Manage Bowel with mod I Assistance. Outcome: Progressing Goal: RH STG MANAGE BOWEL W/EQUIPMENT W/ASSISTANCE Description: STG Manage Bowel With Equipment With Assistance Outcome: Progressing   Problem: SCI BLADDER ELIMINATION Goal: RH STG MANAGE BLADDER WITH ASSISTANCE Description: STG Manage Bladder With mod I Assistance Outcome: Progressing

## 2019-07-31 NOTE — Progress Notes (Signed)
Occupational Therapy Session Note  Patient Details  Name: Jorge Weaver. MRN: [id number] Date of Birth: [birth year]  Today's Date: 07/31/2019 OT Individual Time: 4436-0165 OT Individual Time Calculation (min): 43 min    Short Term Goals: Week 4:  OT Short Term Goal 1 (Week 4): STG=LTG 2/2 ELOS; LTG downgraded  Skilled Therapeutic Interventions/Progress Updates:    Pt resting in bed upon arrival with wife present.  Continued education, discharge planning, and home safety recommendations. Myofascial release for B pectoralis and upper traps for pain management with pt report of decreased tightness/pain.   Therapy Documentation Precautions:  Precautions Precautions: Fall Precaution Comments: ataxia Restrictions Weight Bearing Restrictions: No Pain:  Pt c/o BUE/hand numbness and pain; emotional support   Other Treatments: Myofascial Release: Myofascial release to B pectoralis and upper traps for pain management and increase BUE function in ADLs.   Therapy/Group: Individual Therapy  [name] 07/31/2019, 2:36 PM

## 2019-07-31 NOTE — Progress Notes (Signed)
[facility] PHYSICAL MEDICINE & REHABILITATION PROGRESS NOTE   Subjective/Complaints: Continues to have intermittent hand burning and soreness in pecs.  Started voltaren gel to both arms last night. Therapy notes reviewed; yesterday in gym he performed squat pivot transfers with min-mod A and stood with RW. -   ROS:  Pt denies SOB, abd pain, CP, N/V/C/D, and vision changes  Objective:   No results found. No results for input(s): WBC, HGB, HCT, PLT in the last 72 hours. Recent Labs    07/29/19 0503  NA 140  K 4.0  CL 103  CO2 29  GLUCOSE 126*  BUN 7  CREATININE 0.81  CALCIUM 9.0    Intake/Output Summary (Last 24 hours) at 07/31/2019 1133 Last data filed at 07/31/2019 0700 Gross per 24 hour  Intake 218 ml  Output 625 ml  Net -407 ml     Physical Exam: Vital Signs Blood pressure 110/76, pulse 68, temperature 97.8 F (36.6 C), resp. rate 17, height 5\' 11"  (1.803 m), weight 117.8 kg, SpO2 98 %. Constitutional: lying in bed comfortably.  HEENT: conjugate gaze- chronic facial sensation decreased Neck: supple Cardiovascular: RRR Respiratory/Chest: still a little coarse- but better GI/Abdomen: Soft, NT, ND, (+)BS  Ext: no clubbing, cyanosis, or edema Skin: Warm and dry.  Intact. Psych: appropriate Musc: No edema; tight pecs, upper traps, and levators- not scalenes; - no hoffman's B/L. TTP over pecs. Neuro: Alert. Distal sensory loss in all 4's Motor: Bilateral upper extremities: 4 -/5 proximal distal, unchanged Bilateral lower extremities: 4/5 proximally, 3+ distally   Assessment/Plan: 1. 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  Physiatrist is providing close team supervision and 24 hour management of active medical problems listed below.  Physiatrist and rehab team continue to assess barriers to  discharge/monitor patient progress toward functional and medical goals  Care Tool:  Bathing    Body parts bathed by patient: Right arm, Left arm, Chest, Abdomen, Front perineal area, Buttocks, Right upper leg, Left upper leg, Right lower leg, Left lower leg, Face   Body parts bathed by helper: Buttocks, Left lower leg, Right lower leg     Bathing assist Assist Level: Contact Guard/Touching assist     Upper Body Dressing/Undressing Upper body dressing   What is the patient wearing?: Pull over shirt    Upper body assist Assist Level: Set up assist    Lower Body Dressing/Undressing Lower body dressing      What is the patient wearing?: Pants, Incontinence brief     Lower body assist Assist for lower body dressing: Minimal Assistance - Patient > 75%     Toileting Toileting    Toileting assist Assist for toileting: Maximal Assistance - Patient 25 - 49%     Transfers Chair/bed transfer  Transfers assist     Chair/bed transfer assist level: Minimal Assistance - Patient > 75%     Locomotion Ambulation   Ambulation assist   Ambulation activity did not occur: Safety/medical concerns  Assist level: Minimal Assistance - Patient > 75% Assistive device: Walker-rolling Max distance: 20'   Walk 10 feet activity   Assist  Walk 10 feet activity did not occur: Safety/medical concerns  Assist level: Minimal Assistance - Patient > 75% Assistive device: Walker-rolling   Walk 50 feet activity   Assist Walk 50 feet with 2 turns activity did not occur: Safety/medical concerns  Walk 150 feet activity   Assist Walk 150 feet activity did not occur: Safety/medical concerns         Walk 10 feet on uneven surface  activity   Assist Walk 10 feet on uneven surfaces activity did not occur: Safety/medical concerns         Wheelchair     Assist Will patient use wheelchair at discharge?: Yes Type of Wheelchair: Manual    Wheelchair assist  level: Supervision/Verbal cueing Max wheelchair distance: 100'    Wheelchair 50 feet with 2 turns activity    Assist        Assist Level: Supervision/Verbal cueing   Wheelchair 150 feet activity     Assist      Assist Level: Supervision/Verbal cueing   Blood pressure 110/76, pulse 68, temperature 97.8 F (36.6 C), resp. rate 17, height 5\' 11"  (1.803 m), weight 117.8 kg, SpO2 98 %.  Medical Problem List and Plan: 1.  Impaired mobility and ADLs secondary to Guillain Barre Syndrome with miller Fischer Variant.   Continue CIR 2.  Antithrombotics: -DVT/anticoagulation:  Pharmaceutical: Heparin  4/20- changed to lovenox- renal function OK             -antiplatelet therapy: N/a 3. Pain Management: Low back pain + neuropathic pain  Tylenol prn.  lyrica to 100mg  at noc, increased to twice daily on 5/2  Continue lidocaine patches and ointment  Increased duloxetine to 60 mg QHS- for nerve pain   5/7- worse again this AM- can increase Lyrica THIS WEEKEND to 200 mg TID_ increased Duloxetine to 90 mg daily  5/9 I asked the patient if he wanted to try more lyrica, and he said he did. Will increase to 200mg  tid  5/10- wants to stay at current dose- good- since at max dose.   5/12- nerve pain doing better  5/13: continues to have intermittent burning in hands. Will increase Lyrica to 200mg  TID over weekend.  4. Mood: LCSW to follow for evaluation and support.              -antipsychotic agents: N/a 5. Neuropsych: This patient is capable of making decisions on his own behalf. 6. Skin/Wound Care: Routine pressure relief measures.  7. Fluids/Electrolytes/Nutrition: Monitor I/Os.  8. HTN: Monitor BP tid--continue Hydralazine, metoprolol. Has been labile.   DC'd amlodipine  Vitals:   07/31/19 0317 07/31/19 0617  BP: 119/80 110/76  Pulse: 72 68  Resp: 17   Temp: 97.8 F (36.6 C)   SpO2: 98%       5/8-9 resting DBP high yesterday, improved today. No changes for now  5/10-  Gets orthostatic 92/60 this AM with sitting, not even standing- asymptomatic this AM.   5/11- 129/84- improved, but when gets up is orthostatic-   5/12- no orthostasis this AM per OT- con't regimen  5/13: BP well controlled. 9. Hyponatremia: Has received Tolvaptan, fluid restriction, and salt tablets.   4/29- on 1500 cc fluid restriction  Sodium 132 on 4/26, labs ordered for tomorrow  5/3- Na 139- will stop sodium tablets 1 mg BID and monitor  5/5- labs in AM to f/u on Sodium- make sure not too low  5/11- Na 140 still- off salt tabs and fluid restriction 10. H/o alcohol use: Continue Thiamine and Folic acid. boderline low Mg 1.7-1.8 range.  Added supplement.  4/22- had pt see Neuropsychology.   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   Hemoglobin 10.8 on 4/26  Continue to  monitor 12. Abnormal LFTs:   Elevated on 4/20, labs ordered for tomorrow  5/4- LFTs much improved- in normal range now 13. Bladder incontinence/neurogenic bladder:   Improving 14. Dysarthria:   Stable 15. Insomnia: Continue Ambien prn  16. Neurogenic bowel/constipation- no control  Ordered Mg citrate and add Senokot 1 tab BID on 4/30  Improving  5/3- LBM Saturday but doesn't feel like needs to go- will discuss tomorrow   5/4- will order Sorbitol again and increase senokot to 2 tabs BID  5/5- will give Mg citrate - since hasn't gone  5/6- had good BM- will add Miralax if needed  5/10- give 1/2 dose mg citrate- since cannot take sorbitol home.   5/11- 3 BMs yesterday  5/12- Loose stools yesterday- wants ot decrease bowel meds 17. Cough/wheezing  5/7- got CXR which looks good- added nebs QID x2 days and then prn; cough meds and will monitor  5/8-9 encouraging OOB, IS, mobilization of secretions  5/11- needs flutter valve and ICS since still coarse- cough meds prn 18. Muscle spasms  5/11- will schedule robaxin per pt request-  5/12- asked Tom- OT to do myofascial release.     LOS: 24 days A FACE TO [place] [place] 07/31/2019, 11:33 AM

## 2019-07-31 NOTE — Progress Notes (Signed)
Occupational Therapy Session Note  Patient Details  Name: Gunnar Hereford. MRN: [id number] Date of Birth: 01-26-1966  Today's Date: 07/31/2019 OT Individual Time: 0900-1015 OT Individual Time Calculation (min): 75 min    Short Term Goals: Week 4:  OT Short Term Goal 1 (Week 4): STG=LTG 2/2 ELOS; LTG downgraded  Skilled Therapeutic Interventions/Progress Updates:    Pt resting in bed upon arrival with wife present for education.  OT intervention with focus BADL retraining, functional transfers, sit<>stand, standing balance, and education to prepare for discharge on 5/15. Pt's wife assisted with bed>w/c transfer, and bathing/dressing tasks including sit<>stand and standing.  Pt's wife provides appropriate level of assistance in safe manner.  Discussed home safety. Pt requires min A for squat pivot and stand pivot transfers.  Min A for sit<>stand and standing balance. See Care Tool for assistance with bathing/dressing. Pt remained in w/c with wife present.   Therapy Documentation Precautions:  Precautions Precautions: Fall Precaution Comments: ataxia Restrictions Weight Bearing Restrictions: No   Pain: Pt c/o increased B hand pain after bathing/dressing tasks; emotional support   Therapy/Group: Individual Therapy  [name] 07/31/2019, 11:02 AM

## 2019-07-31 NOTE — Progress Notes (Signed)
Physical Therapy Session Note  Patient Details  Name: Jorge Weaver. MRN: [id number] Date of Birth: [birth year]  Today's Date: 07/31/2019 PT Individual Time: 0881-1031 PT Individual Time Calculation (min): 70 min   Short Term Goals: Week 4:  PT Short Term Goal 1 (Week 4): =LTG due to ELOS  Skilled Therapeutic Interventions/Progress Updates:    Pt received seated in w/c in room, agreeable to PT session. Pt reports ongoing burning and itching in hands, not rated. Pt's wife Chipper Oman present for ongoing hands-on family education session. Session focus on car transfer in/out of patient's actual car of [employer] that he will d/c home in. Pt requires assist x 2 for a safe transfer in/out of his car due to passenger seat height. Pt requires assist for BLE placement on running board so that he can push his hips back up onto car seat. Pt's wife actively participates in assisting with transfer and is able to lead second trial of transfer with patient. Pt's wife demonstrates good safety awareness and ability to cue patient in order to complete transfer safely. Manual w/c propulsion x 100 ft with use of BUE at Supervision level before pt reports burning pain in hands is too great to continue. Squat pivot transfer back to bed with min A. Sit to supine at Supervision level. Reviewed how to ACE wrap BLE if necessary depending on pt's BP at home. Provided handouts for ACE wrapping, where to purchase bedrail, and how to bump a w/c up/down stairs. Pt and his wife with no further questions following family education session. Pt left semi-reclined in bed with needs in reach, bed alarm in place at end of session.  Therapy Documentation Precautions:  Precautions Precautions: Fall Precaution Comments: ataxia Restrictions Weight Bearing Restrictions: No    Therapy/Group: Individual Therapy   Peter Congo, PT, DPT  07/31/2019, 11:28 AM

## 2019-07-31 NOTE — Progress Notes (Signed)
Occupational Therapy Discharge Summary  Patient Details  Name: Jorge Weaver. MRN: [id number] Date of Birth: 1965/11/26  Patient has met 34 of 11 long term goals due to improved activity tolerance, improved balance, postural control, ability to compensate for deficits and improved coordination.  Pt made steady progress with BADLs and functional transfers during this admission.  Pt continues to exhibit ataxia which currently impacts BADLs and transfers.  Pt requires min A for LB dressing, toileting, and all functional transfers. Pt requires min A for sit<>stand and standing balance.  Pt's wife has been present for therapy and provides the appropriate level of assistance. Patient to discharge at overall min A level.  Patient's care partner is independent to provide the necessary physical assistance at discharge.    Recommendation:  Patient will benefit from ongoing skilled OT services in home health setting to continue to advance functional skills in the area of BADL and iADL.  Equipment: BSC, pt purchsed TTB privately  Reasons for discharge: treatment goals met  Patient/family agrees with progress made and goals achieved: Yes  OT Discharge Vision Baseline Vision/History: Wears glasses Wears Glasses: Reading only Patient Visual Report: No change from baseline Perception  Perception: Within Functional Limits Praxis Praxis: Intact Cognition Overall Cognitive Status: Within Functional Limits for tasks assessed Arousal/Alertness: Awake/alert Orientation Level: Oriented X4 Attention: Sustained;Selective Sustained Attention: Appears intact Selective Attention: Appears intact Memory: Appears intact Immediate Memory Recall: Sock;Blue;Bed Memory Recall Sock: Without Cue Memory Recall Blue: Without Cue Memory Recall Bed: Without Cue Awareness: Appears intact Problem Solving: Appears intact Safety/Judgment: Appears intact Sensation Sensation Light Touch: Impaired Detail Light Touch  Impaired Details: Impaired RUE;Impaired LUE;Impaired RLE;Impaired LLE Hot/Cold: Impaired by gross assessment Proprioception: Impaired Detail Proprioception Impaired Details: Impaired RLE;Impaired LLE;Impaired LUE;Impaired RUE Coordination Gross Motor Movements are Fluid and Coordinated: No Fine Motor Movements are Fluid and Coordinated: No Coordination and Movement Description: decreased smoothness and accuracy Finger Nose Finger Test: ataxia Motor  Motor Motor: Ataxia      Trunk/Postural Assessment  Cervical Assessment Cervical Assessment: Within Functional Limits Thoracic Assessment Thoracic Assessment: (rounded shoulders) Lumbar Assessment Lumbar Assessment: (posterior pelvic tilt) Postural Control Postural Control: (insufficient)  Balance Static Sitting Balance Static Sitting - Balance Support: No upper extremity supported Static Sitting - Level of Assistance: 6: Modified independent (Device/Increase time) Dynamic Sitting Balance Dynamic Sitting - Level of Assistance: 5: Stand by assistance Extremity/Trunk Assessment RUE Assessment RUE Assessment: Exceptions to [facility] General Strength Comments: limited by ataxia and sensation deficits LUE Assessment LUE Assessment: Exceptions to [facility] General Strength Comments: limited by ataxia and sensation deficits   Leroy Libman 07/31/2019, 2:53 PM

## 2019-07-31 NOTE — Progress Notes (Signed)
Recreational Therapy Discharge Summary Patient Details  Name: Jedaiah Rathbun. MRN: [id number] Date of Birth: 1966-02-01 Today's Date: 07/31/2019  Long term goals set: 1  Long term goals met: 1  Comments on progress toward goals: Pt has made good progress during LOS and is ready for discharge home with family on 5/15 to provide 24 hour supervision/assistance.  TR sessions focused on activity analysis identifying potential modifications, dynamic sitting and standing balance during co-treats with PT.  Once seated EOM, pt is able to complete simple TR tasks with set-up/supervision-min assist for balance and UE use.  Standing activities require additional assistance.  Goal met. Reasons for discharge: discharge from hospital Patient/family agrees with progress made and goals achieved: Yes  Silvie Obremski 07/31/2019, 3:47 PM

## 2019-08-01 ENCOUNTER — Inpatient Hospital Stay (HOSPITAL_COMMUNITY): Payer: BC Managed Care – PPO | Admitting: Occupational Therapy

## 2019-08-01 ENCOUNTER — Inpatient Hospital Stay (HOSPITAL_COMMUNITY): Payer: BC Managed Care – PPO | Admitting: Physical Therapy

## 2019-08-01 MED ORDER — METHOCARBAMOL 500 MG PO TABS: 500.0000 mg | ORAL_TABLET | Freq: Three times a day (TID) | ORAL | 0 refills | Status: DC

## 2019-08-01 MED ORDER — HYDRALAZINE HCL 50 MG PO TABS: 75.0000 mg | ORAL_TABLET | Freq: Three times a day (TID) | ORAL | 0 refills | Status: DC

## 2019-08-01 MED ORDER — FOLIC ACID 1 MG PO TABS
1.0000 mg | ORAL_TABLET | Freq: Every day | ORAL | 0 refills | Status: DC
Start: 2019-08-01 — End: 2019-09-02

## 2019-08-01 MED ORDER — THIAMINE HCL 100 MG PO TABS
100.0000 mg | ORAL_TABLET | Freq: Every day | ORAL | 0 refills | Status: DC
Start: 2019-08-01 — End: 2019-09-02

## 2019-08-01 MED ORDER — DULOXETINE HCL 30 MG PO CPEP: 90.0000 mg | ORAL_CAPSULE | Freq: Every day | ORAL | 0 refills | Status: DC

## 2019-08-01 MED ORDER — LIDOCAINE 5 % EX PTCH: 2.0000 | MEDICATED_PATCH | CUTANEOUS | 0 refills | Status: DC

## 2019-08-01 MED ORDER — MAGNESIUM CITRATE PO SOLN
1.0000 | Freq: Once | ORAL | Status: AC
  Administered 2019-08-01: 1 via ORAL
  Filled 2019-08-01: qty 296

## 2019-08-01 MED ORDER — SENNA 8.6 MG PO TABS: 1.0000 | ORAL_TABLET | Freq: Two times a day (BID) | ORAL | 0 refills | Status: DC

## 2019-08-01 MED ORDER — DICLOFENAC SODIUM 1 % EX GEL: 2.0000 g | Freq: Two times a day (BID) | CUTANEOUS | 0 refills | Status: DC

## 2019-08-01 MED ORDER — PREGABALIN 200 MG PO CAPS: 200.0000 mg | ORAL_CAPSULE | Freq: Three times a day (TID) | ORAL | 0 refills | Status: DC

## 2019-08-01 MED ORDER — MAGNESIUM OXIDE 400 (241.3 MG) MG PO TABS: 400.0000 mg | ORAL_TABLET | Freq: Two times a day (BID) | ORAL | 0 refills | Status: DC

## 2019-08-01 MED ORDER — TRAMADOL HCL 50 MG PO TABS: 50.0000 mg | ORAL_TABLET | Freq: Four times a day (QID) | ORAL | 0 refills | Status: DC | PRN

## 2019-08-01 MED ORDER — ACETAMINOPHEN 325 MG PO TABS: 325.0000 mg | ORAL_TABLET | ORAL | Status: DC | PRN

## 2019-08-01 MED ORDER — TRAZODONE HCL 50 MG PO TABS: 25.0000 mg | ORAL_TABLET | Freq: Every evening | ORAL | 0 refills | Status: DC | PRN

## 2019-08-01 MED ORDER — LIDOCAINE 5 % EX OINT: TOPICAL_OINTMENT | Freq: Four times a day (QID) | CUTANEOUS | 0 refills | Status: DC

## 2019-08-01 MED ORDER — METOPROLOL TARTRATE 100 MG PO TABS: 100.0000 mg | ORAL_TABLET | Freq: Two times a day (BID) | ORAL | 0 refills | Status: DC

## 2019-08-01 NOTE — Discharge Instructions (Signed)
Inpatient Rehab Discharge Instructions  Jorge Weaver. Discharge date and time: 08/02/19    Activities/Precautions/ Functional Status: Activity: no lifting, driving, or strenuous exercise for till cleared by MD Diet: regular diet Wound Care: none needed   Functional status:  ___ No restrictions     ___ Walk up steps independently _X__ 24/7 supervision/assistance   ___ Walk up steps with assistance ___ Intermittent supervision/assistance  ___ Bathe/dress independently ___ Walk with walker     _X__ Bathe/dress with assistance ___ Walk Independently    ___ Shower independently ___ Walk with assistance    ___ Shower with assistance _X__ No alcohol     ___ Return to work/school ________   Special Instructions:  COMMUNITY REFERRALS UPON DISCHARGE:    Home Health:   PT     OT    ST    [facility]                   Agency: [facility]  Phone: [phone]   Medical Equipment/Items Ordered: Biomedical scientist, Beside Commode, Futures trader                                                 Agency/Supplier: [employer] [phone]    My questions have been answered and I understand these instructions. I will adhere to these goals and the provided educational materials after my discharge from the hospital.  Patient/Caregiver Signature _______________________________ Date __________  Clinician Signature _______________________________________ Date __________  Please bring this form and your medication list with you to all your follow-up doctor's appointments.

## 2019-08-01 NOTE — Progress Notes (Signed)
Occupational Therapy Session Note  Patient Details  Name: Jorge Weaver. MRN: [id number] Date of Birth: Jul 17, 1965  Today's Date: 08/01/2019 OT Individual Time: 1105-1200 OT Individual Time Calculation (min): 55 min    Short Term Goals: Week 3:  OT Short Term Goal 1 (Week 3): STG=LTG secondary to ELOS OT Short Term Goal 1 - Progress (Week 3): Progressing toward goal  Skilled Therapeutic Interventions/Progress Updates:    Upon entering the room, pt seated in wheelchair with c/o electrical/heat sensation in B hand and radiating up arm 5/10. OT checking vitals with BP of 113/80 and B thigh high TEDs donned this session. Pt agreeable to OT intervention and reports feeling ready for discharge home. OT reviewed HHOT recommendation and expectations in this setting. Pt asking appropriate questions this session. Pt declined to propel wheelchair secondary to increase hand pain. OT assisted pt to day room via wheelchair. Pt utilized red resistive theraputty for B hand strengthening and coordination. Pt pulling small manipulative from putty first with 4/10 drops from R hand. Pt performed exercises with min cuing for proper technique. OT also discussed energy conservation. Pt given paper handouts for theraputty exercises and energy conservation. Pt requesting to remain in bed with call bell and all needed items within reach.   Therapy Documentation Precautions:  Precautions Precautions: Fall Precaution Comments: ataxia Restrictions Weight Bearing Restrictions: No Vital Signs: Therapy Vitals Pulse Rate: 75 BP: 105/70 ADL: ADL Eating: Minimal assistance Grooming: Moderate assistance Upper Body Bathing: Moderate assistance Lower Body Bathing: Maximal assistance Upper Body Dressing: Moderate assistance Lower Body Dressing: Maximal assistance Toileting: Maximal assistance Toilet Transfer: Maximal verbal cueing Tub/Shower Transfer: Maximal assistance Praxis Praxis: Intact   Therapy/Group:  Individual Therapy  Alen Bleacher 08/01/2019, 12:34 PM

## 2019-08-01 NOTE — Progress Notes (Signed)
Physical Therapy Discharge Summary  Patient Details  Name: Jorge Weaver. MRN: [id number] Date of Birth: 04/29/1965  Today's Date: 08/01/2019  Patient has met 7 of 7 long term goals due to improved activity tolerance, improved balance, improved postural control, increased strength and ability to compensate for deficits.  Patient to discharge at a wheelchair level [place].   Patient's care partner is independent to provide the necessary physical assistance at discharge. Pt's wife and daughter have completed hands-on family education and are safe to assist pt upon d/c home.  Reasons goals not met: Patient has met all rehab goals.  Recommendation:  Patient will benefit from ongoing skilled PT services in home health setting to continue to advance safe functional mobility, address ongoing impairments in endurance, strength, balance, safety, independence with functional mobility, coordination, and minimize fall risk.  Equipment: 20x18 w/c  Reasons for discharge: treatment goals met and discharge from hospital  Patient/family agrees with progress made and goals achieved: Yes  PT Discharge Precautions/Restrictions Precautions Precautions: Fall Precaution Comments: ataxia Restrictions Weight Bearing Restrictions: No Vision/Perception  Perception Perception: Within Functional Limits Praxis Praxis: Intact  Cognition Overall Cognitive Status: Within Functional Limits for tasks assessed Arousal/Alertness: Awake/alert Orientation Level: Oriented X4 Attention: Sustained;Selective Sustained Attention: Appears intact Selective Attention: Appears intact Memory: Appears intact Awareness: Appears intact Problem Solving: Appears intact Safety/Judgment: Appears intact Sensation Sensation Light Touch: Impaired Detail Light Touch Impaired Details: Impaired RUE;Impaired LUE;Absent RLE;Absent LLE Proprioception: Impaired Detail Proprioception Impaired Details: Impaired RUE;Impaired  LUE;Impaired RLE;Impaired LLE Coordination Gross Motor Movements are Fluid and Coordinated: No Fine Motor Movements are Fluid and Coordinated: No Coordination and Movement Description: impaired 2/2 ongoing B UE/LE ataxia Motor  Motor Motor: Ataxia Motor - Skilled Clinical Observations: all four limbs ataxic Motor - Discharge Observations: all four limbs ataxic  Mobility Bed Mobility Bed Mobility: Rolling Right;Rolling Left;Supine to Sit;Sit to Supine Rolling Right: Independent Rolling Left: Independent Supine to Sit: Independent with assistive device Sit to Supine: Independent with assistive device Transfers Transfers: Sit to Stand;Stand Pivot Transfers;Squat Pivot Transfers Sit to Stand: Minimal Assistance - Patient > 75% Stand Pivot Transfers: Minimal Assistance - Patient > 75% Stand Pivot Transfer Details: Verbal cues for sequencing;Verbal cues for technique;Verbal cues for precautions/safety;Verbal cues for safe use of DME/AE;Manual facilitation for weight shifting Squat Pivot Transfers: Minimal Assistance - Patient > 75% Transfer (Assistive device): Rolling walker Locomotion  Gait Ambulation: Yes Gait Assistance: 2 Helpers Assistive device: Rolling walker Gait Assistance Details: Verbal cues for sequencing;Verbal cues for technique;Verbal cues for precautions/safety;Verbal cues for gait pattern;Verbal cues for safe use of DME/AE;Tactile cues for posture Gait Gait: Yes Gait Pattern: Impaired Gait Pattern: Step-to pattern;Decreased step length - right;Decreased step length - left;Decreased hip/knee flexion - right;Decreased hip/knee flexion - left;Decreased weight shift to left;Ataxic;Narrow base of support Gait velocity: decreased Stairs / Additional Locomotion Stairs: No Architect: Yes Wheelchair Assistance: Independent with Camera operator: Both upper extremities Wheelchair Parts Management: Needs assistance   Trunk/Postural Assessment  Cervical Assessment Cervical Assessment: Within Scientist, physiological Assessment: Exceptions to WFL(rounded shoulders) Lumbar Assessment Lumbar Assessment: Exceptions to WFL(lordosis; posterior pelvic tilt) Postural Control Postural Control: Deficits on evaluation  Balance Balance Balance Assessed: Yes Static Sitting Balance Static Sitting - Balance Support: No upper extremity supported Static Sitting - Level of Assistance: 6: Modified independent (Device/Increase time) Dynamic Sitting Balance Dynamic Sitting - Balance Support: No upper extremity supported;Feet supported;During functional activity Dynamic Sitting - Level of Assistance: 5: Stand by assistance  Static Standing Balance Static Standing - Balance Support: Bilateral upper extremity supported;During functional activity Static Standing - Level of Assistance: 4: Min assist Extremity Assessment   RLE Assessment RLE Assessment: Exceptions to [facility] General Strength Comments: see below RLE Strength Right Hip Flexion: 4/5 Right Knee Flexion: 4/5 Right Knee Extension: 4/5 Right Ankle Dorsiflexion: 4/5 LLE Assessment LLE Assessment: Exceptions to [facility] General Strength Comments: see below LLE Strength Left Hip Flexion: 3+/5 Left Knee Flexion: 3+/5 Left Knee Extension: 4/5 Left Ankle Dorsiflexion: 4/5     Excell Seltzer, PT, DPT 08/01/2019, 12:57 PM

## 2019-08-01 NOTE — Progress Notes (Signed)
Physical Therapy Session Note  Patient Details  Name: Jorge Weaver. MRN: [id number] Date of Birth: [birth year]  Today's Date: 08/01/2019 PT Individual Time: 0900-1000; 1300-1415 PT Individual Time Calculation (min): 60 min; 75 min  Short Term Goals: Week 4:  PT Short Term Goal 1 (Week 4): =LTG due to ELOS  Skilled Therapeutic Interventions/Progress Updates:    Session 1: Pt received seated in bed, agreeable to PT session. Pt requesting to use urinal, requires setup A and helper to hold urinal while pt continently voids. Assisted pt with donning shorts and TED hose at bed level. Bed mobility mod I with use of bedrail. While seated EOB pt is setup A to change his shirt. Squat pivot transfers at min A level throughout session. Seated core strengthening exercises with beach volleyball: punchouts, OH lift, R/L diagonals x 15 reps each. Pt has onset of L lateral lean with onset of fatigue, able to correct with visual cues from mirror. Pt agreeable to stay seated in w/c at end of session.  Session 2: Pt received seated in w/c in room, agreeable to PT session. Pt reports ongoing nerve pain in hands, not rated and use of numbing cream for pain management. Pt's equipment he will d/c home with arrived to room. Assisted pt and his wife with setting up w/c with seat cushion, backrest, and theraband on rims for improved grip. Oriented pt to his new equipment. Representative from [employer] present during therapy session to assist with issues with leg rest not fitting properly. Pt is at mod I level for w/c mobility up to 150 ft. Squat pivot transfer to mat table with min A. Sit to stand with min A to RW. Standing forward/backward steps with RW and min A for balance, use of mirror for visual feedback for posture. Standing mini-squats x 15 reps to fatigue with RW and min A for balance. Ambulation 2 x 30 ft with RW and min A for balance with w/c follow for safety. Pt exhibits improved overall gait pattern and ability to  perform gait safely. Pt continues to exhibit impaired BLE proprioception resulting in narrow BOS and ataxic limbs during gait. Assisted pt back to bed at end of session. Pt left semi-reclined in bed with needs in reach, wife present.  Therapy Documentation Precautions:  Precautions Precautions: Fall Precaution Comments: ataxia Restrictions Weight Bearing Restrictions: No    Therapy/Group: Individual Therapy   Peter Congo, PT, DPT  08/01/2019, 12:46 PM

## 2019-08-01 NOTE — Plan of Care (Signed)
  Problem: SCI BOWEL ELIMINATION Goal: RH STG MANAGE BOWEL WITH ASSISTANCE Description: STG Manage Bowel with mod I Assistance. Outcome: Progressing   Problem: SCI BLADDER ELIMINATION Goal: RH STG MANAGE BLADDER WITH ASSISTANCE Description: STG Manage Bladder With mod I Assistance Outcome: Progressing Goal: RH OTHER STG BLADDER ELIMINATION GOALS W/ASSIST Description: Other STG Bladder Elimination Goals With mod I Assistance Outcome: Progressing

## 2019-08-01 NOTE — Discharge Summary (Signed)
Physician Discharge Summary  Patient ID: Jorge Weaver. MRN: [id number] DOB/AGE: May 21, 1965 54 y.o.  Admit date: 07/07/2019 Discharge date: 08/02/2019  Discharge Diagnoses:  Principal Problem:   GBS (Guillain-Barre syndrome) (HCC) Active Problems:   Neuropathic pain   Neurogenic bowel   Transaminitis   Acute blood loss anemia   Discharged Condition: stable   Significant Diagnostic Studies: DG Chest 2 View  Result Date: 07/11/2019 CLINICAL DATA:  Fever, no chest pain or shortness of breath EXAM: CHEST - 2 VIEW COMPARISON:  Radiograph 06/14/2018 FINDINGS: Chronic elevation the right hemidiaphragm with some adjacent atelectatic changes. No consolidation, features of edema, pneumothorax, or effusion. The cardiomediastinal contours are unremarkable. No acute osseous or soft tissue abnormality. Degenerative changes are present in the imaged spine and shoulders. Prior cervical fusion hardware is noted. IMPRESSION: Chronic elevation of the right hemidiaphragm with adjacent atelectatic changes. No acute cardiopulmonary findings. Electronically Signed   By: Kreg Shropshire M.D.   On: 07/11/2019 18:12   DG CHEST PORT 1 VIEW  Result Date: 07/25/2019 CLINICAL DATA:  Cough EXAM: PORTABLE CHEST 1 VIEW COMPARISON:  July 11, 2019 FINDINGS: Lungs are clear. Heart size and pulmonary vascularity are normal. No adenopathy. There is postoperative change in the lower cervical region. No bone lesions. IMPRESSION: Lungs clear.  Cardiac silhouette within normal limits. Electronically Signed   By: Bretta Bang III M.D.   On: 07/25/2019 09:22    Labs:  Basic Metabolic Panel: BMP Latest Ref Rng & Units 07/29/2019 07/24/2019 07/21/2019  Glucose 70 - 99 mg/dL 740(C) 144(Y) 185(U)  BUN 6 - 20 mg/dL 7 9 10   Creatinine 0.61 - 1.24 mg/dL 3.14 9.70  Sodium 135 - 145 mmol/L 140 140 139  Potassium 3.5 - 5.1 mmol/L 4.0 4.0 3.6  Chloride 98 - 111 mmol/L 103 105 103  CO2 22 - 32 mmol/L 29 29 28   Calcium 8.9 - 10.3  mg/dL 9.0 9.1 9.1    CBC: CBC Latest Ref Rng & Units 07/28/2019 07/24/2019 07/21/2019  WBC 4.0 - 10.5 K/uL 5.4 5.1 4.6  Hemoglobin 13.0 - 17.0 g/dL 10.6(L) 10.8(L) 10.9(L)  Hematocrit 39.0 - 52.0 % 33.6(L) 34.7(L) 33.2(L)  Platelets 150 - 400 K/uL 293 305 384    CBG: No results for input(s): GLUCAP in the last 168 hours.  Brief HPI:   Jorge Weaver. is a 54 y.o. male with history of HTN, cervical spondylosis with myelopathy s/p decompression 10/20, ETOH use who was admitted on 06/29/19 with increase in numbness BUE/BLE progressing to her umbilicus and face, recurrent falls and hesitancy.  He was severely hypokalemic at admission with potassium at 2.2 which was supplemented.  Work-up revealed moderate foraminal stenosis C5 and C8 nerve roots and severe at left C6 nerve level as well as moderate right L4 foraminal stenosis and moderate L5 foraminal stenosis.  Neurosurgery was consulted for input and felt that surgical intervention was not needed.    Dr. 11/20 with neurology was consulted for input and recommended IV thiamine due to history of alcohol abuse with evidence of macrocytic anemia as patient continued to have ongoing symptoms past hypokalemia supplemented.  LP was done revealing albuminocytologic disassociation and he was treated with IVIG x5 days.  He has had issues with hyponatremia with drop in sodium to 125 was treated briefly with Lasix, tolvaptan and IVF with some improvement.  Therapy was ongoing and patient continued to be limited by diffuse weakness affecting functional status.  CIR was recommended due to functional decline  Hospital Course: Jorge Weaver. was admitted to rehab 07/07/2019 for inpatient therapies to consist of PT and OT at least three hours five days a week. Past admission physiatrist, therapy team and rehab RN have worked together to provide customized collaborative inpatient rehab.Blood pressures were monitored on TID basis and orthostatic changes noted.  Norvasc  was discontinued. Bilateral THT were ordered for support and to be worn when out of bed. As activity tolerance improved, orthostatic symptoms are resolving. His po intake is good and bowel program has been adjusted to help manage constipation. He is continent of bowel and bladder.  NIF was checked for a few days and was stable therefore discontinued. His respiratory status is stable has been stable. Follow up CBC showed that ABLA is stable. Abnormal LFTs have resolved.   Mag ox added due to low magnesium levels. Hyponatremia has been monitored with serial checks and has gradually improved. This continues to be stable off salt tabs and is off fluid restrictions.  He continued to report pain due to nerve pain and Gabapentin was ineffective therefore this was changed to Lyrica. Latter was titrated upwards and he is tolerating this without side effects. Duloxetine was also added and titrated to 90 mg at bedtime.  Lidocaine gel also added to help manage dysesthesias bilateral hands. His back pain was managed with use of lidocaine patches. Dr. Sima Matas has been following patient to work on coping and adjustment issues. He has made gains during rehab stay and currently requires min assist at wheelchair level. He will continue to receive follow up [place] [facility] [place] [facility] care after discharge   Rehab course: During patient's stay in rehab weekly team conferences were held to monitor patient's progress, set goals and discuss barriers to discharge. At admission, patient required max assist with mobility and with basic self care tasks. Speech was mildly dysarthric but fully intelligible. He was educated on utilization of speech intelligibility strategies and ST signed off. He has had improvement in activity tolerance, balance, postural control as well as ability to compensate for deficits.  He requires mod assist for UB care and max assist for lower body care. He requires min assist for transfers and is able  to ambulate with +2 assist and cues for sequencing and safety.   Disposition: Home  Diet: Regular.   Special Instructions: 1. No driving or strenuous activity till cleared by MD   Discharge Instructions    Ambulatory referral to Neurology   Complete by: As directed    An appointment is requested in approximately: 2-3 weeks --follow up on GBS   Ambulatory referral to Physical Medicine Rehab   Complete by: As directed      Allergies as of 08/02/2019   No Known Allergies     Medication List    STOP taking these medications   amLODipine 10 MG tablet Commonly known as: NORVASC   famotidine 20 MG tablet Commonly known as: Pepcid   levocetirizine 5 MG tablet Commonly known as: XYZAL   potassium chloride 10 MEQ tablet Commonly known as: Klor-Con 10   tamsulosin 0.4 MG Caps capsule Commonly known as: FLOMAX     TAKE these medications   acetaminophen 325 MG tablet Commonly known as: TYLENOL Take 1-2 tablets (325-650 mg total) by mouth every 4 (four) hours as needed for mild pain. What changed:   medication strength  how much to take  when to take this  reasons to take this  additional instructions   diclofenac  Sodium 1 % Gel Commonly known as: VOLTAREN Apply 2 g topically 2 (two) times daily.   DULoxetine 30 MG capsule Commonly known as: CYMBALTA Take 3 capsules (90 mg total) by mouth at bedtime.   folic acid 1 MG tablet Commonly known as: FOLVITE Take 1 tablet (1 mg total) by mouth daily.   hydrALAZINE 50 MG tablet Commonly known as: APRESOLINE Take 1.5 tablets (75 mg total) by mouth every 8 (eight) hours.   lidocaine 5 % ointment Commonly known as: XYLOCAINE Apply topically 4 (four) times daily. To bilateral hands   lidocaine 5 % Commonly known as: LIDODERM Place 2 patches onto the skin daily. On for 12 hours and off for 12 hours--purchase over the counter Notes to patient: Purchase this over the counter   magnesium oxide 400 (241.3 Mg) MG  tablet Commonly known as: MAG-OX Take 1 tablet (400 mg total) by mouth 2 (two) times daily.   methocarbamol 500 MG tablet Commonly known as: ROBAXIN Take 1 tablet (500 mg total) by mouth 3 (three) times daily.   metoprolol tartrate 100 MG tablet Commonly known as: LOPRESSOR Take 1 tablet (100 mg total) by mouth 2 (two) times daily.   senna 8.6 MG Tabs tablet Commonly known as: SENOKOT Take 1 tablet (8.6 mg total) by mouth 2 (two) times daily. Notes to patient: For constipation   thiamine 100 MG tablet Take 1 tablet (100 mg total) by mouth daily.   traMADol 50 MG tablet Commonly known as: ULTRAM Take 1 tablet (50 mg total) by mouth every 6 (six) hours as needed.   traZODone 50 MG tablet Commonly known as: DESYREL Take 0.5-1 tablets (25-50 mg total) by mouth at bedtime as needed for sleep.      Follow-up Information    Lovorn, Aundra Millet, MD Follow up.   Specialty: Physical Medicine and Rehabilitation Why: Office will call you with follow up appointment Contact information: [address] [place] [address] [phone]        [name], DO. Call.   Specialty: Family Medicine Why: for post hospital follow up Contact information: [address] [address] [place] [address] [phone]        [facility] Follow up.   Why: Will call with follow up appointment Contact information: [address] [place], [address] [place] [address] [phone]          Signed: Jacquelynn Cree 08/06/2019, 11:53 AM

## 2019-08-01 NOTE — Progress Notes (Signed)
[place] PHYSICAL MEDICINE & REHABILITATION PROGRESS NOTE   Subjective/Complaints: Swallowing his medications this morning.  Continues to have intermittent hand burning and soreness in pecs. Is not sure if the Lyrica is helping.   Feels constipated.   ROS:  Pt denies SOB, abd pain, CP, N/V/C/D, and vision changes  Objective:   No results found. No results for input(s): WBC, HGB, HCT, PLT in the last 72 hours. No results for input(s): NA, K, CL, CO2, GLUCOSE, BUN, CREATININE, CALCIUM in the last 72 hours.  Intake/Output Summary (Last 24 hours) at 08/01/2019 1308 Last data filed at 08/01/2019 1252 Gross per 24 hour  Intake 836 ml  Output 400 ml  Net 436 ml     Physical Exam: Vital Signs Blood pressure 105/70, pulse 75, temperature 98.4 F (36.9 C), resp. rate 19, height 5\' 11"  (1.803 m), weight 119 kg, SpO2 96 %. Constitutional: lying in bed comfortably swallowing his morning medications.  HEENT: conjugate gaze- chronic facial sensation decreased Neck: supple Cardiovascular: RRR Respiratory/Chest: still a little coarse- but better GI/Abdomen: Soft, NT, ND, (+)BS  Ext: no clubbing, cyanosis, or edema Skin: Warm and dry.  Intact. Psych: appropriate Musc: No edema; tight pecs, upper traps, and levators- not scalenes; - no hoffman's B/L. TTP over pecs. Neuro: Alert. Distal sensory loss in all 4's Motor: Bilateral upper extremities: 4 -/5 proximal distal, unchanged Bilateral lower extremities: 4/5 proximally, 3+ distally   Assessment/Plan: 1. 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  Physiatrist is providing close team supervision and 24 hour management of active medical problems listed below.  Physiatrist and rehab team continue to assess barriers to discharge/monitor patient progress toward functional  and medical goals  Care Tool:  Bathing    Body parts bathed by patient: Right arm, Left arm, Chest, Abdomen, Front perineal area, Buttocks, Right upper leg, Left upper leg, Right lower leg, Left lower leg, Face   Body parts bathed by helper: Buttocks, Left lower leg, Right lower leg     Bathing assist Assist Level: Contact Guard/Touching assist     Upper Body Dressing/Undressing Upper body dressing   What is the patient wearing?: Pull over shirt    Upper body assist Assist Level: Set up assist    Lower Body Dressing/Undressing Lower body dressing      What is the patient wearing?: Pants, Incontinence brief     Lower body assist Assist for lower body dressing: Minimal Assistance - Patient > 75%     Toileting Toileting    Toileting assist Assist for toileting: Maximal Assistance - Patient 25 - 49%     Transfers Chair/bed transfer  Transfers assist     Chair/bed transfer assist level: Minimal Assistance - Patient > 75%     Locomotion Ambulation   Ambulation assist   Ambulation activity did not occur: Safety/medical concerns  Assist level: Minimal Assistance - Patient > 75% Assistive device: Walker-rolling Max distance: 20'   Walk 10 feet activity   Assist  Walk 10 feet activity did not occur: Safety/medical concerns  Assist level: Minimal Assistance - Patient > 75% Assistive device: Walker-rolling   Walk 50 feet activity   Assist Walk 50 feet with 2 turns activity did not occur: Safety/medical concerns         Walk 150 feet activity   Assist Walk 150 feet activity did not occur: Safety/medical concerns  Walk 10 feet on uneven surface  activity   Assist Walk 10 feet on uneven surfaces activity did not occur: Safety/medical concerns         Wheelchair     Assist Will patient use wheelchair at discharge?: Yes Type of Wheelchair: Manual    Wheelchair assist level: Supervision/Verbal cueing Max wheelchair distance:  100'    Wheelchair 50 feet with 2 turns activity    Assist        Assist Level: Supervision/Verbal cueing   Wheelchair 150 feet activity     Assist      Assist Level: Supervision/Verbal cueing   Blood pressure 105/70, pulse 75, temperature 98.4 F (36.9 C), resp. rate 19, height 5\' 11"  (1.803 m), weight 119 kg, SpO2 96 %.  Medical Problem List and Plan: 1.  Impaired mobility and ADLs secondary to Guillain Barre Syndrome with miller Fischer Variant.   Continue CIR 2.  Antithrombotics: -DVT/anticoagulation:  Pharmaceutical: Heparin  4/20- changed to lovenox- renal function OK             -antiplatelet therapy: N/a 3. Pain Management: Low back pain + neuropathic pain  Tylenol prn.  lyrica to 100mg  at noc, increased to twice daily on 5/2  Continue lidocaine patches and ointment  Increased duloxetine to 60 mg QHS- for nerve pain   5/7- worse again this AM- can increase Lyrica THIS WEEKEND to 200 mg TID_ increased Duloxetine to 90 mg daily  5/9 I asked the patient if he wanted to try more lyrica, and he said he did. Will increase to 200mg  tid  5/10- wants to stay at current dose- good- since at max dose.   5/12- nerve pain doing better  5/14: continues to have pain on max dose of Lyrica.  4. Mood: LCSW to follow for evaluation and support.              -antipsychotic agents: N/a 5. Neuropsych: This patient is capable of making decisions on his own behalf. 6. Skin/Wound Care: Routine pressure relief measures.  7. Fluids/Electrolytes/Nutrition: Monitor I/Os.  8. HTN: Monitor BP tid--continue Hydralazine, metoprolol. Has been labile.   DC'd amlodipine  Vitals:   08/01/19 0440 08/01/19 0836  BP: 110/87 105/70  Pulse: 72 75  Resp: 19   Temp: 98.4 F (36.9 C)   SpO2: 96%       5/8-9 resting DBP high yesterday, improved today. No changes for now  5/10- Gets orthostatic 92/60 this AM with sitting, not even standing- asymptomatic this AM.   5/11- 129/84- improved,  but when gets up is orthostatic-   5/12- no orthostasis this AM per OT- con't regimen  5/13: BP well controlled. 9. Hyponatremia: Has received Tolvaptan, fluid restriction, and salt tablets.   4/29- on 1500 cc fluid restriction  Sodium 132 on 4/26, labs ordered for tomorrow  5/3- Na 139- will stop sodium tablets 1 mg BID and monitor  5/5- labs in AM to f/u on Sodium- make sure not too low  5/11- Na 140 still- off salt tabs and fluid restriction 10. H/o alcohol use: Continue Thiamine and Folic acid. boderline low Mg 1.7-1.8 range.  Added supplement.  4/22- had pt see Neuropsychology.   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   Hemoglobin 10.8 on 4/26  Continue to monitor 12. Abnormal LFTs:   Elevated on 4/20, labs ordered for tomorrow  5/4- LFTs much improved- in normal range now 13. Bladder incontinence/neurogenic bladder:   Improving 14.  Dysarthria:   Stable 15. Insomnia: Continue Ambien prn  16. Neurogenic bowel/constipation- no control  Ordered Mg citrate and add Senokot 1 tab BID on 4/30  Improving  5/3- LBM Saturday but doesn't feel like needs to go- will discuss tomorrow   5/4- will order Sorbitol again and increase senokot to 2 tabs BID  5/5- will give Mg citrate - since hasn't gone  5/6- had good BM- will add Miralax if needed  5/10- give 1/2 dose mg citrate- since cannot take sorbitol home.   5/11- 3 BMs yesterday  5/12- Loose stools yesterday- wants ot decrease bowel meds  5/14: Feeling constipated: mag citrate x1.  17. Cough/wheezing  5/7- got CXR which looks good- added nebs QID x2 days and then prn; cough meds and will monitor  5/8-9 encouraging OOB, IS, mobilization of secretions  5/11- needs flutter valve and ICS since still coarse- cough meds prn 18. Muscle spasms  5/11- will schedule robaxin per pt request-  5/12- asked Tom- OT to do myofascial release.   19. Disposition: discharge tomorrow.   LOS: 25 days A FACE TO  FACE EVALUATION WAS PERFORMED  Shanah Guimaraes P Genisis Sonnier 08/01/2019, 1:08 PM

## 2019-08-02 ENCOUNTER — Other Ambulatory Visit: Payer: Self-pay | Admitting: Physical Medicine and Rehabilitation

## 2019-08-02 MED ORDER — PREGABALIN 200 MG PO CAPS: 200.0000 mg | ORAL_CAPSULE | Freq: Three times a day (TID) | ORAL | 0 refills | Status: DC

## 2019-08-02 NOTE — Progress Notes (Signed)
Patient d/c home with wife, he denied pain or discomfort at the time of d/c. A&O x4 at the time of d/c.  All questions and concerns we answered. Nursing staffs had some difficulties transferring patient into his wife car but some bystanders help [place] with the transfer. We continue to monitor

## 2019-08-02 NOTE — Progress Notes (Addendum)
[facility] PHYSICAL MEDICINE & REHABILITATION PROGRESS NOTE   Subjective/Complaints: Stable for DC today. Had BM yesterday with mag citrate. Lyrica has been increased to max dose and he is tolerating change.  Will have f/u with Dr. Berline Chough in 1-2 weeks.   ROS:  Pt denies SOB, abd pain, CP, N/V/C/D, and vision changes  Objective:   No results found. No results for input(s): WBC, HGB, HCT, PLT in the last 72 hours. No results for input(s): NA, K, CL, CO2, GLUCOSE, BUN, CREATININE, CALCIUM in the last 72 hours.  Intake/Output Summary (Last 24 hours) at 08/02/2019 0845 Last data filed at 08/02/2019 0730 Gross per 24 hour  Intake 360 ml  Output 325 ml  Net 35 ml     Physical Exam: Vital Signs Blood pressure 129/85, pulse 74, temperature 98.1 F (36.7 C), temperature source Oral, resp. rate 18, height 5\' 11"  (1.803 m), weight 119 kg, SpO2 99 %. Constitutional: lying in bed comfortably  HEENT: conjugate gaze- chronic facial sensation decreased Neck: supple Cardiovascular: RRR Respiratory/Chest: still a little coarse- but better GI/Abdomen: Soft, NT, less distended after BM, (+)BS  Ext: no clubbing, cyanosis, or edema Skin: Warm and dry.  Intact. Psych: appropriate Musc: No edema; tight pecs, upper traps, and levators- not scalenes; - no hoffman's B/L. TTP over pecs. Neuro: Alert. Distal sensory loss in all 4's Motor: Bilateral upper extremities: 4 -/5 proximal distal, unchanged Bilateral lower extremities: 4/5 proximally, 3+ distally   Assessment/Plan: 1. 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  Physiatrist is providing close team supervision and 24 hour management of active medical problems listed below.  Physiatrist and rehab team continue to assess barriers to discharge/monitor patient progress toward  functional and medical goals  Care Tool:  Bathing    Body parts bathed by patient: Right arm, Left arm, Chest, Abdomen, Front perineal area, Buttocks, Right upper leg, Left upper leg, Right lower leg, Left lower leg, Face   Body parts bathed by helper: Buttocks, Left lower leg, Right lower leg     Bathing assist Assist Level: Contact Guard/Touching assist     Upper Body Dressing/Undressing Upper body dressing   What is the patient wearing?: Pull over shirt    Upper body assist Assist Level: Set up assist    Lower Body Dressing/Undressing Lower body dressing      What is the patient wearing?: Pants, Incontinence brief     Lower body assist Assist for lower body dressing: Minimal Assistance - Patient > 75%     Toileting Toileting    Toileting assist Assist for toileting: Maximal Assistance - Patient 25 - 49%     Transfers Chair/bed transfer  Transfers assist     Chair/bed transfer assist level: Minimal Assistance - Patient > 75%     Locomotion Ambulation   Ambulation assist   Ambulation activity did not occur: Safety/medical concerns  Assist level: Minimal Assistance - Patient > 75% Assistive device: Walker-rolling Max distance: 30'   Walk 10 feet activity   Assist  Walk 10 feet activity did not occur: Safety/medical concerns  Assist level: Minimal Assistance - Patient > 75% Assistive device: Walker-rolling   Walk 50 feet activity   Assist Walk 50 feet with 2 turns activity did not occur: Safety/medical concerns         Walk 150 feet activity   Assist Walk 150 feet activity did  not occur: Safety/medical concerns         Walk 10 feet on uneven surface  activity   Assist Walk 10 feet on uneven surfaces activity did not occur: Safety/medical concerns         Wheelchair     Assist Will patient use wheelchair at discharge?: Yes Type of Wheelchair: Manual    Wheelchair assist level: Supervision/Verbal cueing,  Independent Max wheelchair distance: 150'    Wheelchair 50 feet with 2 turns activity    Assist        Assist Level: Independent   Wheelchair 150 feet activity     Assist      Assist Level: Independent   Blood pressure 129/85, pulse 74, temperature 98.1 F (36.7 C), temperature source Oral, resp. rate 18, height 5\' 11"  (1.803 m), weight 119 kg, SpO2 99 %.  Medical Problem List and Plan: 1.  Impaired mobility and ADLs secondary to Guillain Barre Syndrome with miller Fischer Variant.   DC home today.  2.  Antithrombotics: -DVT/anticoagulation:  Pharmaceutical: Heparin  4/20- changed to lovenox- renal function OK             -antiplatelet therapy: N/a 3. Pain Management: Low back pain + neuropathic pain  Tylenol prn.  lyrica to 100mg  at noc, increased to twice daily on 5/2  Continue lidocaine patches and ointment  Increased duloxetine to 60 mg QHS- for nerve pain   5/7- worse again this AM- can increase Lyrica THIS WEEKEND to 200 mg TID_ increased Duloxetine to 90 mg daily  5/9 I asked the patient if he wanted to try more lyrica, and he said he did. Will increase to 200mg  tid  5/10- wants to stay at current dose- good- since at max dose.   5/12- nerve pain doing better  5/14: continues to have pain on max dose of Lyrica.   5/15: appears more comfortable today 4. Mood: LCSW to follow for evaluation and support.              -antipsychotic agents: N/a 5. Neuropsych: This patient is capable of making decisions on his own behalf. 6. Skin/Wound Care: Routine pressure relief measures.  7. Fluids/Electrolytes/Nutrition: Monitor I/Os.  8. HTN: Monitor BP tid--continue Hydralazine, metoprolol. Has been labile.   DC'd amlodipine  Vitals:   08/01/19 1951 08/02/19 0506  BP: (!) 150/102 129/85  Pulse: 91 74  Resp: 18 18  Temp: 97.8 F (36.6 C) 98.1 F (36.7 C)  SpO2: 96% 99%      5/8-9 resting DBP high yesterday, improved today. No changes for now  5/10- Gets  orthostatic 92/60 this AM with sitting, not even standing- asymptomatic this AM.   5/11- 129/84- improved, but when gets up is orthostatic-   5/12- no orthostasis this AM per OT- con't regimen  5/15: BP well controlled.  9. Hyponatremia: Has received Tolvaptan, fluid restriction, and salt tablets.   4/29- on 1500 cc fluid restriction  Sodium 132 on 4/26, labs ordered for tomorrow  5/3- Na 139- will stop sodium tablets 1 mg BID and monitor  5/5- labs in AM to f/u on Sodium- make sure not too low  5/11- Na 140 still- off salt tabs and fluid restriction 10. H/o alcohol use: Continue Thiamine and Folic acid. boderline low Mg 1.7-1.8 range.  Added supplement.  4/22- had pt see Neuropsychology.   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   Hemoglobin 10.8 on 4/26  Continue to monitor 12. Abnormal  LFTs:   Elevated on 4/20, labs ordered for tomorrow  5/4- LFTs much improved- in normal range now 13. Bladder incontinence/neurogenic bladder:   Improving 14. Dysarthria:   Stable 15. Insomnia: Continue Ambien prn  16. Neurogenic bowel/constipation- no control  Ordered Mg citrate and add Senokot 1 tab BID on 4/30  Improving  5/3- LBM Saturday but doesn't feel like needs to go- will discuss tomorrow   5/4- will order Sorbitol again and increase senokot to 2 tabs BID  5/5- will give Mg citrate - since hasn't gone  5/6- had good BM- will add Miralax if needed  5/10- give 1/2 dose mg citrate- since cannot take sorbitol home.   5/11- 3 BMs yesterday  5/12- Loose stools yesterday- wants ot decrease bowel meds  5/14: Feeling constipated: mag citrate x1. 5/15: Had BM yesterday   17. Cough/wheezing  5/7- got CXR which looks good- added nebs QID x2 days and then prn; cough meds and will monitor  5/8-9 encouraging OOB, IS, mobilization of secretions  5/11- needs flutter valve and ICS since still coarse- cough meds prn 18. Muscle spasms  5/11- will schedule robaxin  per pt request-  5/12- asked Tom- OT to do myofascial release.   19. Disposition: discharge home today.   >30 minutes spent in face-to-face care of Mr. Lefevers in discussion of constipation, pain, vitals, prognosis, medications, and follow-up care.   LOS: 26 days A FACE TO FACE EVALUATION WAS PERFORMED  Eulogio Requena P Aiken Withem 08/02/2019, 8:45 AM

## 2019-08-04 NOTE — Progress Notes (Signed)
Inpatient Rehab Care Coordinator Discharge Note Inpatient Rehabilitation Care Coordinator  Discharge Note  The overall goal for the admission was met for:   Discharge location: Yes  Length of Stay: Yes, 27 Days  Discharge activity level: Yes  Home/community participation: Yes  Services provided included: MD, RD, PT, OT, SLP, RN, CM, TR, Pharmacy, Neuropsych and SW  Financial Services: Private Insurance: [employer]  Follow-up services arranged: Home Health: [facility]  Comments (or additional information): PT/OT/ST/HHaide  Patient/Family verbalized understanding of follow-up arrangements: Yes  Individual responsible for coordination of the follow-up planVentura Sellers, [phone]  Confirmed correct DME delivered: Dyanne Iha 08/04/2019    Dyanne Iha

## 2019-08-05 DIAGNOSIS — J45909 Unspecified asthma, uncomplicated: Secondary | ICD-10-CM | POA: Diagnosis not present

## 2019-08-05 DIAGNOSIS — M4712 Other spondylosis with myelopathy, cervical region: Secondary | ICD-10-CM | POA: Diagnosis not present

## 2019-08-05 DIAGNOSIS — F1011 Alcohol abuse, in remission: Secondary | ICD-10-CM | POA: Diagnosis not present

## 2019-08-05 DIAGNOSIS — R2689 Other abnormalities of gait and mobility: Secondary | ICD-10-CM | POA: Diagnosis not present

## 2019-08-05 DIAGNOSIS — G610 Guillain-Barre syndrome: Secondary | ICD-10-CM | POA: Diagnosis not present

## 2019-08-05 DIAGNOSIS — K219 Gastro-esophageal reflux disease without esophagitis: Secondary | ICD-10-CM | POA: Diagnosis not present

## 2019-08-05 DIAGNOSIS — R32 Unspecified urinary incontinence: Secondary | ICD-10-CM | POA: Diagnosis not present

## 2019-08-05 DIAGNOSIS — K760 Fatty (change of) liver, not elsewhere classified: Secondary | ICD-10-CM | POA: Diagnosis not present

## 2019-08-05 DIAGNOSIS — R296 Repeated falls: Secondary | ICD-10-CM | POA: Diagnosis not present

## 2019-08-05 DIAGNOSIS — D539 Nutritional anemia, unspecified: Secondary | ICD-10-CM | POA: Diagnosis not present

## 2019-08-05 DIAGNOSIS — I10 Essential (primary) hypertension: Secondary | ICD-10-CM | POA: Diagnosis not present

## 2019-08-05 DIAGNOSIS — G4700 Insomnia, unspecified: Secondary | ICD-10-CM | POA: Diagnosis not present

## 2019-08-05 DIAGNOSIS — R471 Dysarthria and anarthria: Secondary | ICD-10-CM | POA: Diagnosis not present

## 2019-08-06 ENCOUNTER — Telehealth: Payer: Self-pay | Admitting: Family Medicine

## 2019-08-06 DIAGNOSIS — I10 Essential (primary) hypertension: Secondary | ICD-10-CM | POA: Diagnosis not present

## 2019-08-06 DIAGNOSIS — J45909 Unspecified asthma, uncomplicated: Secondary | ICD-10-CM | POA: Diagnosis not present

## 2019-08-06 DIAGNOSIS — D539 Nutritional anemia, unspecified: Secondary | ICD-10-CM | POA: Diagnosis not present

## 2019-08-06 DIAGNOSIS — R296 Repeated falls: Secondary | ICD-10-CM | POA: Diagnosis not present

## 2019-08-06 DIAGNOSIS — F1011 Alcohol abuse, in remission: Secondary | ICD-10-CM | POA: Diagnosis not present

## 2019-08-06 DIAGNOSIS — K219 Gastro-esophageal reflux disease without esophagitis: Secondary | ICD-10-CM | POA: Diagnosis not present

## 2019-08-06 DIAGNOSIS — K760 Fatty (change of) liver, not elsewhere classified: Secondary | ICD-10-CM | POA: Diagnosis not present

## 2019-08-06 DIAGNOSIS — R32 Unspecified urinary incontinence: Secondary | ICD-10-CM | POA: Diagnosis not present

## 2019-08-06 DIAGNOSIS — R2689 Other abnormalities of gait and mobility: Secondary | ICD-10-CM | POA: Diagnosis not present

## 2019-08-06 DIAGNOSIS — G4700 Insomnia, unspecified: Secondary | ICD-10-CM | POA: Diagnosis not present

## 2019-08-06 DIAGNOSIS — G610 Guillain-Barre syndrome: Secondary | ICD-10-CM | POA: Diagnosis not present

## 2019-08-06 DIAGNOSIS — M4712 Other spondylosis with myelopathy, cervical region: Secondary | ICD-10-CM | POA: Diagnosis not present

## 2019-08-06 DIAGNOSIS — R471 Dysarthria and anarthria: Secondary | ICD-10-CM | POA: Diagnosis not present

## 2019-08-06 NOTE — Telephone Encounter (Signed)
Caller Mark  Call Back # [phone]   Requesting a Verbal Order   For PT   2 a Week for 5 Weeks

## 2019-08-07 ENCOUNTER — Telehealth: Payer: Self-pay | Admitting: Family Medicine

## 2019-08-07 ENCOUNTER — Telehealth: Payer: Self-pay

## 2019-08-07 NOTE — Telephone Encounter (Signed)
That's fine

## 2019-08-07 NOTE — Telephone Encounter (Signed)
Karen notified

## 2019-08-07 NOTE — Telephone Encounter (Signed)
Is it ok to give VO ? 

## 2019-08-07 NOTE — Telephone Encounter (Signed)
Jorge Weaver with [facility]  is requesting for Verbal orders for  speech therapy. Once a week for 4 weeks. Please advise. Okay to Leave vm

## 2019-08-07 NOTE — Telephone Encounter (Signed)
Is it okay to give VO ? 

## 2019-08-07 NOTE — Telephone Encounter (Signed)
Ok to give VO? 

## 2019-08-07 NOTE — Telephone Encounter (Signed)
Requesting a Verbal Order for OT   Caller : [facility]  Call Back # [phone]  Frequency : OT   2 times  A week for 4weeks

## 2019-08-07 NOTE — Telephone Encounter (Signed)
Haskell Riling from [facility] called in to give a report about medication interactions for this patient. Please give Eunice Blase a call at [phone] ask to speak to [facility].

## 2019-08-08 DIAGNOSIS — G610 Guillain-Barre syndrome: Secondary | ICD-10-CM | POA: Diagnosis not present

## 2019-08-08 DIAGNOSIS — R471 Dysarthria and anarthria: Secondary | ICD-10-CM | POA: Diagnosis not present

## 2019-08-08 DIAGNOSIS — J45909 Unspecified asthma, uncomplicated: Secondary | ICD-10-CM | POA: Diagnosis not present

## 2019-08-08 DIAGNOSIS — G4700 Insomnia, unspecified: Secondary | ICD-10-CM | POA: Diagnosis not present

## 2019-08-08 DIAGNOSIS — M4712 Other spondylosis with myelopathy, cervical region: Secondary | ICD-10-CM | POA: Diagnosis not present

## 2019-08-08 DIAGNOSIS — F1011 Alcohol abuse, in remission: Secondary | ICD-10-CM | POA: Diagnosis not present

## 2019-08-08 DIAGNOSIS — R296 Repeated falls: Secondary | ICD-10-CM | POA: Diagnosis not present

## 2019-08-08 DIAGNOSIS — R2689 Other abnormalities of gait and mobility: Secondary | ICD-10-CM | POA: Diagnosis not present

## 2019-08-08 DIAGNOSIS — K760 Fatty (change of) liver, not elsewhere classified: Secondary | ICD-10-CM | POA: Diagnosis not present

## 2019-08-08 DIAGNOSIS — D539 Nutritional anemia, unspecified: Secondary | ICD-10-CM | POA: Diagnosis not present

## 2019-08-08 DIAGNOSIS — K219 Gastro-esophageal reflux disease without esophagitis: Secondary | ICD-10-CM | POA: Diagnosis not present

## 2019-08-08 DIAGNOSIS — R32 Unspecified urinary incontinence: Secondary | ICD-10-CM | POA: Diagnosis not present

## 2019-08-08 DIAGNOSIS — I10 Essential (primary) hypertension: Secondary | ICD-10-CM | POA: Diagnosis not present

## 2019-08-11 DIAGNOSIS — G610 Guillain-Barre syndrome: Secondary | ICD-10-CM | POA: Diagnosis not present

## 2019-08-11 DIAGNOSIS — K219 Gastro-esophageal reflux disease without esophagitis: Secondary | ICD-10-CM | POA: Diagnosis not present

## 2019-08-11 DIAGNOSIS — K760 Fatty (change of) liver, not elsewhere classified: Secondary | ICD-10-CM | POA: Diagnosis not present

## 2019-08-11 DIAGNOSIS — R296 Repeated falls: Secondary | ICD-10-CM | POA: Diagnosis not present

## 2019-08-11 DIAGNOSIS — R2689 Other abnormalities of gait and mobility: Secondary | ICD-10-CM | POA: Diagnosis not present

## 2019-08-11 DIAGNOSIS — R32 Unspecified urinary incontinence: Secondary | ICD-10-CM | POA: Diagnosis not present

## 2019-08-11 DIAGNOSIS — F1011 Alcohol abuse, in remission: Secondary | ICD-10-CM | POA: Diagnosis not present

## 2019-08-11 DIAGNOSIS — G4700 Insomnia, unspecified: Secondary | ICD-10-CM | POA: Diagnosis not present

## 2019-08-11 DIAGNOSIS — R471 Dysarthria and anarthria: Secondary | ICD-10-CM | POA: Diagnosis not present

## 2019-08-11 DIAGNOSIS — I10 Essential (primary) hypertension: Secondary | ICD-10-CM | POA: Diagnosis not present

## 2019-08-11 DIAGNOSIS — D539 Nutritional anemia, unspecified: Secondary | ICD-10-CM | POA: Diagnosis not present

## 2019-08-11 DIAGNOSIS — M4712 Other spondylosis with myelopathy, cervical region: Secondary | ICD-10-CM | POA: Diagnosis not present

## 2019-08-11 DIAGNOSIS — J45909 Unspecified asthma, uncomplicated: Secondary | ICD-10-CM | POA: Diagnosis not present

## 2019-08-11 NOTE — Telephone Encounter (Signed)
Called [phone] unable to speak to anyone.  They transferred to an RN and no one answered.

## 2019-08-12 NOTE — Telephone Encounter (Signed)
Prn tramadol and that dose of Cymbalta is not particularly alarming. No changes needed. Ty.

## 2019-08-12 NOTE — Telephone Encounter (Signed)
Paper work is in [employer] for review/signature when returns to the office on 08/13/2019

## 2019-08-12 NOTE — Telephone Encounter (Signed)
Called to let RN Haskell Riling know of PCP response to question. Will fax over order once PCP is back in the office/completes form and will fax

## 2019-08-12 NOTE — Telephone Encounter (Signed)
RN Haskell Riling called back and confirmed paperwork received. Tramadol interactions/as well as cymbalta Just needs to write either "no changes made" or "changes explain"

## 2019-08-13 ENCOUNTER — Ambulatory Visit: Payer: BC Managed Care – PPO | Admitting: Family Medicine

## 2019-08-13 ENCOUNTER — Other Ambulatory Visit: Payer: Self-pay

## 2019-08-13 ENCOUNTER — Encounter: Payer: Self-pay | Admitting: Family Medicine

## 2019-08-13 VITALS — BP 114/62 | HR 70 | Temp 97.0°F

## 2019-08-13 DIAGNOSIS — G610 Guillain-Barre syndrome: Secondary | ICD-10-CM

## 2019-08-13 DIAGNOSIS — E876 Hypokalemia: Secondary | ICD-10-CM

## 2019-08-13 NOTE — Patient Instructions (Signed)
Let me know if you are interested in rechecking your potassium levels.  If you do not hear anything about your referral in the next 1-2 weeks, call our office and ask for an update.  Send me a message if the PM&R team has no further recommendations for the burning pain.  Let us know if you need anything.

## 2019-08-13 NOTE — Progress Notes (Signed)
Chief Complaint  Patient presents with  . Hospitalization Follow-up    Subjective: Patient is a 54 y.o. male here for f/u. Here w wife.  Pt was admitted and tx'd for GBS on 4/11-4/19/21 and transitioned to CIR until 5/15 where he was d/c'd.  His weakness is getting better overall.  He still has neuropathic pain despite being on 600 mg daily of Lyrica and 90 mg daily of Cymbalta.  He was given topical capsaicin cream that has not been helpful.  He has been constipated from his medication but does not drink much water.  He is taking senna and magnesium.  He does not have a follow-up with neurology currently.  He is also found to be profoundly hypokalemic.  He was aggressively repleted and levels were normal upon discharge.   Past Medical History:  Diagnosis Date  . Anemia    low iron  . Asthma    as a child  . Cervical spondylosis with myelopathy   . Fatty liver   . GERD (gastroesophageal reflux disease)   . Heart murmur    when he was younger   . Hypertension   . Pneumonia     Objective: BP 114/62 (BP Location: Left Arm, Patient Position: Sitting, Cuff Size: Normal)   Pulse 70   Temp (!) 97 F (36.1 C) (Temporal)   SpO2 96%  General: Awake, appears stated age HEENT: MMM, EOMi Heart: RRR, no lower extremity edema Lungs: CTAB, no rales, wheezes or rhonchi. No accessory muscle use Psych: Age appropriate judgment and insight, normal affect and mood  Assessment and Plan: GBS (Guillain-Barre syndrome) (HCC) - Plan: Ambulatory referral to Neurology  Hypokalemia - Plan: Basic metabolic panel  We will have him see the neurology team on an outpatient basis.  He has follow-up with [facility] tomorrow.  If no improvement/recommendation regarding neuropathic pain, will add antiepileptic medicine Tegretol. The patient and his wife voiced understanding and agreement to the plan.  [name] [place], DO 08/13/19  2:03 PM

## 2019-08-14 ENCOUNTER — Encounter: Payer: BC Managed Care – PPO | Attending: Registered Nurse | Admitting: Registered Nurse

## 2019-08-14 ENCOUNTER — Other Ambulatory Visit: Payer: Self-pay

## 2019-08-14 VITALS — BP 105/72 | HR 61 | Temp 97.2°F

## 2019-08-14 DIAGNOSIS — I10 Essential (primary) hypertension: Secondary | ICD-10-CM | POA: Diagnosis not present

## 2019-08-14 DIAGNOSIS — R2689 Other abnormalities of gait and mobility: Secondary | ICD-10-CM | POA: Diagnosis not present

## 2019-08-14 DIAGNOSIS — K760 Fatty (change of) liver, not elsewhere classified: Secondary | ICD-10-CM | POA: Diagnosis not present

## 2019-08-14 DIAGNOSIS — J45909 Unspecified asthma, uncomplicated: Secondary | ICD-10-CM | POA: Diagnosis not present

## 2019-08-14 DIAGNOSIS — R471 Dysarthria and anarthria: Secondary | ICD-10-CM | POA: Diagnosis not present

## 2019-08-14 DIAGNOSIS — K592 Neurogenic bowel, not elsewhere classified: Secondary | ICD-10-CM | POA: Diagnosis not present

## 2019-08-14 DIAGNOSIS — R296 Repeated falls: Secondary | ICD-10-CM | POA: Diagnosis not present

## 2019-08-14 DIAGNOSIS — R32 Unspecified urinary incontinence: Secondary | ICD-10-CM | POA: Diagnosis not present

## 2019-08-14 DIAGNOSIS — D539 Nutritional anemia, unspecified: Secondary | ICD-10-CM | POA: Diagnosis not present

## 2019-08-14 DIAGNOSIS — M792 Neuralgia and neuritis, unspecified: Secondary | ICD-10-CM

## 2019-08-14 DIAGNOSIS — R531 Weakness: Secondary | ICD-10-CM | POA: Diagnosis not present

## 2019-08-14 DIAGNOSIS — G4700 Insomnia, unspecified: Secondary | ICD-10-CM | POA: Diagnosis not present

## 2019-08-14 DIAGNOSIS — K219 Gastro-esophageal reflux disease without esophagitis: Secondary | ICD-10-CM | POA: Diagnosis not present

## 2019-08-14 DIAGNOSIS — F1011 Alcohol abuse, in remission: Secondary | ICD-10-CM | POA: Diagnosis not present

## 2019-08-14 DIAGNOSIS — M4712 Other spondylosis with myelopathy, cervical region: Secondary | ICD-10-CM | POA: Diagnosis not present

## 2019-08-14 DIAGNOSIS — G610 Guillain-Barre syndrome: Secondary | ICD-10-CM

## 2019-08-14 MED ORDER — TRAMADOL HCL 50 MG PO TABS: 50.0000 mg | ORAL_TABLET | Freq: Four times a day (QID) | ORAL | 0 refills | Status: DC | PRN

## 2019-08-14 NOTE — Patient Instructions (Addendum)
Call [facility] to schedule Hospital Follow Up Appointment [address] [place]  [address] [phone]   Call Office No Later than June 10th regarding his medication

## 2019-08-14 NOTE — Progress Notes (Signed)
Subjective:    Patient ID: Jorge Glad., male    DOB: [birth year], 54 y.o.   MRN: [id number]  HPI Jorge Weaver. is a 54 y.o. male who is here for Hospital F/U of his Guillain- Barre- Syndrome, Neuropathic Pain, Neurogenic Bowel and Weakness. Jorge Weaver went to [facility] Emergency Room on 06/28/2019 with complaints of increasing weakness with numbness in bilateral upper and bilateral lower extremities. Neurology and Neurosurgery  was Consulted:  MRI Cervical Spine W or WO Contrast:  IMPRESSION: 1. 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 2. Status post ACDF at C5-C6 and C6-C7. Resolved spinal stenosis at C6-C7, and regressed although not resolved abnormal spinal cord signal and enhancement centered at that level. 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 3. Mild cervical spinal stenosis elsewhere, including some residual cord mass effect related to C5 endplate spurring, but no other abnormal cord signal is identified. 4. Degenerative neural foraminal stenosis, up to moderate at the left C5 and C8 nerve levels, and severe at the left C6 nerve level.  MRI Brain W and WO Contrast:  IMPRESSION: No acute intracranial abnormality and largely unremarkable for age MRI appearance of the brain.  MRI Lumbar Spine W WO Contrast:  IMPRESSION: 1. Lumbar thecal sac patency has improved since the January MRI at all levels above L4-L5, due to regressed epidural lipomatosis. 2. Chronic disc degeneration at L4-L5 has progressed in the form of new right foraminal annular fissure. Moderate right L4 foraminal stenosis. Mild to moderate multifactorial spinal stenosis is stable. 3. Advanced chronic L5-S1 degeneration is stable with mild spinal and lateral recess stenosis, and moderate to severe L5 foraminal stenosis greater on the left.  Jorge Weaver was admitted to Inpatient rehabilitation on 07/07/2019 and Discharged  Home on 08/02/2019. He is receiving Therapy from [facility]. He reports he has pain in his upper and lower extremities, lower back pain and reports generalized weakness. He rates his pain 7.   When Jorge Weaver arrived to office he tried to stand on scale and reports his lower extremities became weak, he was assisted to wheelchair via 3 staff members. He did not fall on floor. No weight obtain at this time.  He reports he has a good appetite.    Wife in room all questions answered.    Pain Inventory Average Pain 7 Pain Right Now 7 My pain is constant, sharp and tingling  In the last 24 hours, has pain interfered with the following? General activity 10 Relation with others 0 Enjoyment of life 7 What TIME of day is your pain at its worst? night Sleep (in general) Poor  Pain is worse with: walking, bending, sitting, inactivity and standing Pain improves with: medication Relief from Meds: 2  Mobility walk with assistance use a walker ability to climb steps?  no do you drive?  no use a wheelchair needs help with transfers  Function employed # of hrs/week . I need assistance with the following:  feeding, dressing, bathing, toileting, meal prep, household duties and shopping  Neuro/Psych bladder control problems weakness numbness tingling trouble walking dizziness  Prior Studies TC appt  Physicians involved in your care TC appt   Family History  Problem Relation Age of Onset  . Hypertension Mother   . Heart attack Father   . Colon cancer Sister   . Esophageal cancer Neg Hx   . Rectal cancer Neg Hx   . Stomach cancer Neg Hx    Social  History   Socioeconomic History  . Marital status: Married    Spouse name: [name]  . Number of children: 5  . Years of education: Not on file  . Highest education level: Associate degree: occupational, Scientist, product/process development, or vocational program  Occupational History    Comment: electrician, heat/AC  Tobacco Use  . Smoking  status: Never Smoker  . Smokeless tobacco: Never Used  Substance and Sexual Activity  . Alcohol use: Yes    Alcohol/week: 3.0 standard drinks    Types: 3 Cans of beer per week  . Drug use: No  . Sexual activity: Not on file  Other Topics Concern  . Not on file  Social History Narrative   Lives with wife, child   Caffeine- occas   Social Determinants of Health   Financial Resource Strain:   . Difficulty of Paying Living Expenses:   Food Insecurity:   . Worried About Programme researcher, broadcasting/film/video in the Last Year:   . Barista in the Last Year:   Transportation Needs:   . Freight forwarder (Medical):   Marland Kitchen Lack of Transportation (Non-Medical):   Physical Activity:   . Days of Exercise per Week:   . Minutes of Exercise per Session:   Stress:   . Feeling of Stress :   Social Connections:   . Frequency of Communication with Friends and Family:   . Frequency of Social Gatherings with Friends and Family:   . Attends Religious Services:   . Active Member of Clubs or Organizations:   . Attends Banker Meetings:   Marland Kitchen Marital Status:    Past Surgical History:  Procedure Laterality Date  . ANTERIOR CERVICAL DECOMP/DISCECTOMY FUSION N/A 01/16/2019   Procedure: Cervical six corpectomy with Cervical five-seven fusion and plating;  Surgeon: Coletta Memos, MD;  Location: [facility] OR;  Service: Neurosurgery;  Laterality: N/A;  . NO PAST SURGERIES    . WISDOM TOOTH EXTRACTION    . WRIST SURGERY Left 2014   fx   Past Medical History:  Diagnosis Date  . Anemia    low iron  . Asthma    as a child  . Cervical spondylosis with myelopathy   . Fatty liver   . GERD (gastroesophageal reflux disease)   . Heart murmur    when he was younger   . Hypertension   . Pneumonia    BP 105/72   Pulse 61   Temp (!) 97.2 F (36.2 C)   SpO2 93%   Opioid Risk Score:   Fall Risk Score:  `1  Depression screen PHQ 2/9  Depression screen PHQ 2/9 08/14/2019  Decreased Interest 0  Down,  Depressed, Hopeless 0  PHQ - 2 Score 0  Altered sleeping 0  Tired, decreased energy 3  Change in appetite 0  Feeling bad or failure about yourself  0  Trouble concentrating 0  Moving slowly or fidgety/restless 0  Suicidal thoughts 0  PHQ-9 Score 3  Difficult doing work/chores Not difficult at all    Review of Systems  Musculoskeletal: Positive for gait problem.  Neurological: Positive for dizziness, weakness and numbness.  All other systems reviewed and are negative.      Objective:   Physical Exam Vitals and nursing note reviewed.  Constitutional:      Appearance: Normal appearance.  Cardiovascular:     Rate and Rhythm: Normal rate and regular rhythm.     Pulses: Normal pulses.     Heart sounds: Normal heart sounds.  Pulmonary:     Effort: Pulmonary effort is normal.     Breath sounds: Normal breath sounds.  Musculoskeletal:     Cervical back: Normal range of motion and neck supple.     Comments: Normal Muscle Bulk and Muscle Testing Reveals:  Upper Extremities: Right: Full ROM and Muscle Strength 4/5 Left Upper Extremity: Decreased ROM 45 Degrees  And Muscle Strength 3/5 Lumbar Paraspinal Tenderness: L-3- L-5  Lower Extremities: Decreased ROM and Muscle Strength 3/5 Arrived in wheelchair   Skin:    General: Skin is warm and dry.  Neurological:     Mental Status: He is alert and oriented to person, place, and time.  Psychiatric:        Mood and Affect: Mood normal.        Behavior: Behavior normal.           Assessment & Plan:  1. Guillain- Barre- Syndrome: Continue Home Health Therapy. Neurology Following. He has scheduled appointment with Neurology. Continue to monitor. 2. Neuropathic Pain: PCP Following. Continue Lyrica. Continue to Monitor.  3. Neurogenic Bowel: Continue bowel Program. Continue to Monitor. PCP Following. 4. Weakness: .Continue Home Health Therapy. Continue to Monitor.   20  minutes of face to face patient care time was spent during  this visit. All questions were encouraged and answered.  F/U 4- 6 weeks with Dr Dagoberto Ligas

## 2019-08-15 DIAGNOSIS — K760 Fatty (change of) liver, not elsewhere classified: Secondary | ICD-10-CM | POA: Diagnosis not present

## 2019-08-15 DIAGNOSIS — M4712 Other spondylosis with myelopathy, cervical region: Secondary | ICD-10-CM | POA: Diagnosis not present

## 2019-08-15 DIAGNOSIS — J45909 Unspecified asthma, uncomplicated: Secondary | ICD-10-CM | POA: Diagnosis not present

## 2019-08-15 DIAGNOSIS — R32 Unspecified urinary incontinence: Secondary | ICD-10-CM | POA: Diagnosis not present

## 2019-08-15 DIAGNOSIS — R2689 Other abnormalities of gait and mobility: Secondary | ICD-10-CM | POA: Diagnosis not present

## 2019-08-15 DIAGNOSIS — G610 Guillain-Barre syndrome: Secondary | ICD-10-CM | POA: Diagnosis not present

## 2019-08-15 DIAGNOSIS — F1011 Alcohol abuse, in remission: Secondary | ICD-10-CM | POA: Diagnosis not present

## 2019-08-15 DIAGNOSIS — R296 Repeated falls: Secondary | ICD-10-CM | POA: Diagnosis not present

## 2019-08-15 DIAGNOSIS — G4700 Insomnia, unspecified: Secondary | ICD-10-CM | POA: Diagnosis not present

## 2019-08-15 DIAGNOSIS — D539 Nutritional anemia, unspecified: Secondary | ICD-10-CM | POA: Diagnosis not present

## 2019-08-15 DIAGNOSIS — K219 Gastro-esophageal reflux disease without esophagitis: Secondary | ICD-10-CM | POA: Diagnosis not present

## 2019-08-15 DIAGNOSIS — I10 Essential (primary) hypertension: Secondary | ICD-10-CM | POA: Diagnosis not present

## 2019-08-15 DIAGNOSIS — R471 Dysarthria and anarthria: Secondary | ICD-10-CM | POA: Diagnosis not present

## 2019-08-18 DIAGNOSIS — D539 Nutritional anemia, unspecified: Secondary | ICD-10-CM | POA: Diagnosis not present

## 2019-08-18 DIAGNOSIS — R2689 Other abnormalities of gait and mobility: Secondary | ICD-10-CM | POA: Diagnosis not present

## 2019-08-18 DIAGNOSIS — R32 Unspecified urinary incontinence: Secondary | ICD-10-CM | POA: Diagnosis not present

## 2019-08-18 DIAGNOSIS — M4712 Other spondylosis with myelopathy, cervical region: Secondary | ICD-10-CM | POA: Diagnosis not present

## 2019-08-18 DIAGNOSIS — K219 Gastro-esophageal reflux disease without esophagitis: Secondary | ICD-10-CM | POA: Diagnosis not present

## 2019-08-18 DIAGNOSIS — R296 Repeated falls: Secondary | ICD-10-CM | POA: Diagnosis not present

## 2019-08-18 DIAGNOSIS — F1011 Alcohol abuse, in remission: Secondary | ICD-10-CM | POA: Diagnosis not present

## 2019-08-18 DIAGNOSIS — I10 Essential (primary) hypertension: Secondary | ICD-10-CM | POA: Diagnosis not present

## 2019-08-18 DIAGNOSIS — G610 Guillain-Barre syndrome: Secondary | ICD-10-CM | POA: Diagnosis not present

## 2019-08-18 DIAGNOSIS — J45909 Unspecified asthma, uncomplicated: Secondary | ICD-10-CM | POA: Diagnosis not present

## 2019-08-18 DIAGNOSIS — K760 Fatty (change of) liver, not elsewhere classified: Secondary | ICD-10-CM | POA: Diagnosis not present

## 2019-08-18 DIAGNOSIS — G4700 Insomnia, unspecified: Secondary | ICD-10-CM | POA: Diagnosis not present

## 2019-08-18 DIAGNOSIS — R471 Dysarthria and anarthria: Secondary | ICD-10-CM | POA: Diagnosis not present

## 2019-08-19 ENCOUNTER — Encounter: Payer: Self-pay | Admitting: Neurology

## 2019-08-20 ENCOUNTER — Encounter: Payer: Self-pay | Admitting: Registered Nurse

## 2019-08-20 DIAGNOSIS — F1011 Alcohol abuse, in remission: Secondary | ICD-10-CM | POA: Diagnosis not present

## 2019-08-20 DIAGNOSIS — I10 Essential (primary) hypertension: Secondary | ICD-10-CM | POA: Diagnosis not present

## 2019-08-20 DIAGNOSIS — R471 Dysarthria and anarthria: Secondary | ICD-10-CM | POA: Diagnosis not present

## 2019-08-20 DIAGNOSIS — R32 Unspecified urinary incontinence: Secondary | ICD-10-CM | POA: Diagnosis not present

## 2019-08-20 DIAGNOSIS — G4700 Insomnia, unspecified: Secondary | ICD-10-CM | POA: Diagnosis not present

## 2019-08-20 DIAGNOSIS — K219 Gastro-esophageal reflux disease without esophagitis: Secondary | ICD-10-CM | POA: Diagnosis not present

## 2019-08-20 DIAGNOSIS — D539 Nutritional anemia, unspecified: Secondary | ICD-10-CM | POA: Diagnosis not present

## 2019-08-20 DIAGNOSIS — J45909 Unspecified asthma, uncomplicated: Secondary | ICD-10-CM | POA: Diagnosis not present

## 2019-08-20 DIAGNOSIS — R296 Repeated falls: Secondary | ICD-10-CM | POA: Diagnosis not present

## 2019-08-20 DIAGNOSIS — R2689 Other abnormalities of gait and mobility: Secondary | ICD-10-CM | POA: Diagnosis not present

## 2019-08-20 DIAGNOSIS — K760 Fatty (change of) liver, not elsewhere classified: Secondary | ICD-10-CM | POA: Diagnosis not present

## 2019-08-20 DIAGNOSIS — M4712 Other spondylosis with myelopathy, cervical region: Secondary | ICD-10-CM | POA: Diagnosis not present

## 2019-08-20 DIAGNOSIS — G610 Guillain-Barre syndrome: Secondary | ICD-10-CM | POA: Diagnosis not present

## 2019-08-21 DIAGNOSIS — M4712 Other spondylosis with myelopathy, cervical region: Secondary | ICD-10-CM | POA: Diagnosis not present

## 2019-08-21 DIAGNOSIS — K219 Gastro-esophageal reflux disease without esophagitis: Secondary | ICD-10-CM | POA: Diagnosis not present

## 2019-08-21 DIAGNOSIS — D539 Nutritional anemia, unspecified: Secondary | ICD-10-CM | POA: Diagnosis not present

## 2019-08-21 DIAGNOSIS — K760 Fatty (change of) liver, not elsewhere classified: Secondary | ICD-10-CM | POA: Diagnosis not present

## 2019-08-21 DIAGNOSIS — R296 Repeated falls: Secondary | ICD-10-CM | POA: Diagnosis not present

## 2019-08-21 DIAGNOSIS — G610 Guillain-Barre syndrome: Secondary | ICD-10-CM | POA: Diagnosis not present

## 2019-08-21 DIAGNOSIS — R2689 Other abnormalities of gait and mobility: Secondary | ICD-10-CM | POA: Diagnosis not present

## 2019-08-21 DIAGNOSIS — F1011 Alcohol abuse, in remission: Secondary | ICD-10-CM | POA: Diagnosis not present

## 2019-08-21 DIAGNOSIS — R471 Dysarthria and anarthria: Secondary | ICD-10-CM | POA: Diagnosis not present

## 2019-08-21 DIAGNOSIS — R32 Unspecified urinary incontinence: Secondary | ICD-10-CM | POA: Diagnosis not present

## 2019-08-21 DIAGNOSIS — J45909 Unspecified asthma, uncomplicated: Secondary | ICD-10-CM | POA: Diagnosis not present

## 2019-08-21 DIAGNOSIS — G4700 Insomnia, unspecified: Secondary | ICD-10-CM | POA: Diagnosis not present

## 2019-08-21 DIAGNOSIS — I10 Essential (primary) hypertension: Secondary | ICD-10-CM | POA: Diagnosis not present

## 2019-08-22 ENCOUNTER — Telehealth: Payer: Self-pay | Admitting: Family Medicine

## 2019-08-22 DIAGNOSIS — R296 Repeated falls: Secondary | ICD-10-CM | POA: Diagnosis not present

## 2019-08-22 DIAGNOSIS — F1011 Alcohol abuse, in remission: Secondary | ICD-10-CM | POA: Diagnosis not present

## 2019-08-22 DIAGNOSIS — I10 Essential (primary) hypertension: Secondary | ICD-10-CM | POA: Diagnosis not present

## 2019-08-22 DIAGNOSIS — R32 Unspecified urinary incontinence: Secondary | ICD-10-CM | POA: Diagnosis not present

## 2019-08-22 DIAGNOSIS — J45909 Unspecified asthma, uncomplicated: Secondary | ICD-10-CM | POA: Diagnosis not present

## 2019-08-22 DIAGNOSIS — G610 Guillain-Barre syndrome: Secondary | ICD-10-CM | POA: Diagnosis not present

## 2019-08-22 DIAGNOSIS — M4712 Other spondylosis with myelopathy, cervical region: Secondary | ICD-10-CM | POA: Diagnosis not present

## 2019-08-22 DIAGNOSIS — R471 Dysarthria and anarthria: Secondary | ICD-10-CM | POA: Diagnosis not present

## 2019-08-22 DIAGNOSIS — K760 Fatty (change of) liver, not elsewhere classified: Secondary | ICD-10-CM | POA: Diagnosis not present

## 2019-08-22 DIAGNOSIS — K219 Gastro-esophageal reflux disease without esophagitis: Secondary | ICD-10-CM | POA: Diagnosis not present

## 2019-08-22 DIAGNOSIS — G4700 Insomnia, unspecified: Secondary | ICD-10-CM | POA: Diagnosis not present

## 2019-08-22 DIAGNOSIS — R2689 Other abnormalities of gait and mobility: Secondary | ICD-10-CM | POA: Diagnosis not present

## 2019-08-22 DIAGNOSIS — D539 Nutritional anemia, unspecified: Secondary | ICD-10-CM | POA: Diagnosis not present

## 2019-08-22 NOTE — Telephone Encounter (Signed)
Caller: Coron Rossano Call Back # [phone]   Per Physical Therapist  , patient has a low blood pressure(66/50) and suggested to follow up with ED department. Patient's wife is looking for a second opinion.

## 2019-08-22 NOTE — Telephone Encounter (Signed)
Called cell/home number left message to call back. 

## 2019-08-22 NOTE — Telephone Encounter (Signed)
Rechecked BP was 88/50 and that was at 1:28. The wife is going to have someone checking him hourly

## 2019-08-22 NOTE — Telephone Encounter (Signed)
Called informed his wife and she has no one to help her get him out of the house at this time. She is going to recheck his BP and call us back. S

## 2019-08-22 NOTE — Telephone Encounter (Signed)
Can we have him come in today for a nurse visit to check his BP. If it is really that low, he needs to go to ED. Stay hydrated. OK to use compression stockings and elevate legs. Ty.

## 2019-08-22 NOTE — Telephone Encounter (Signed)
Called the patients wife and she spoke to the RN at [facility] (RN had cared for him while in the hospital) and she informed to put on compression stockings and elevate the bed..  She stated the RN said that his BP did this in the hospital.

## 2019-08-25 DIAGNOSIS — R32 Unspecified urinary incontinence: Secondary | ICD-10-CM | POA: Diagnosis not present

## 2019-08-25 DIAGNOSIS — R2689 Other abnormalities of gait and mobility: Secondary | ICD-10-CM | POA: Diagnosis not present

## 2019-08-25 DIAGNOSIS — J45909 Unspecified asthma, uncomplicated: Secondary | ICD-10-CM | POA: Diagnosis not present

## 2019-08-25 DIAGNOSIS — G610 Guillain-Barre syndrome: Secondary | ICD-10-CM | POA: Diagnosis not present

## 2019-08-25 DIAGNOSIS — R471 Dysarthria and anarthria: Secondary | ICD-10-CM | POA: Diagnosis not present

## 2019-08-25 DIAGNOSIS — I10 Essential (primary) hypertension: Secondary | ICD-10-CM | POA: Diagnosis not present

## 2019-08-25 DIAGNOSIS — R296 Repeated falls: Secondary | ICD-10-CM | POA: Diagnosis not present

## 2019-08-25 DIAGNOSIS — F1011 Alcohol abuse, in remission: Secondary | ICD-10-CM | POA: Diagnosis not present

## 2019-08-25 DIAGNOSIS — K760 Fatty (change of) liver, not elsewhere classified: Secondary | ICD-10-CM | POA: Diagnosis not present

## 2019-08-25 DIAGNOSIS — K219 Gastro-esophageal reflux disease without esophagitis: Secondary | ICD-10-CM | POA: Diagnosis not present

## 2019-08-25 DIAGNOSIS — D539 Nutritional anemia, unspecified: Secondary | ICD-10-CM | POA: Diagnosis not present

## 2019-08-25 DIAGNOSIS — M4712 Other spondylosis with myelopathy, cervical region: Secondary | ICD-10-CM | POA: Diagnosis not present

## 2019-08-25 DIAGNOSIS — G4700 Insomnia, unspecified: Secondary | ICD-10-CM | POA: Diagnosis not present

## 2019-08-26 DIAGNOSIS — R32 Unspecified urinary incontinence: Secondary | ICD-10-CM | POA: Diagnosis not present

## 2019-08-26 DIAGNOSIS — F1011 Alcohol abuse, in remission: Secondary | ICD-10-CM | POA: Diagnosis not present

## 2019-08-26 DIAGNOSIS — R296 Repeated falls: Secondary | ICD-10-CM | POA: Diagnosis not present

## 2019-08-26 DIAGNOSIS — M4712 Other spondylosis with myelopathy, cervical region: Secondary | ICD-10-CM | POA: Diagnosis not present

## 2019-08-26 DIAGNOSIS — K219 Gastro-esophageal reflux disease without esophagitis: Secondary | ICD-10-CM | POA: Diagnosis not present

## 2019-08-26 DIAGNOSIS — G4700 Insomnia, unspecified: Secondary | ICD-10-CM | POA: Diagnosis not present

## 2019-08-26 DIAGNOSIS — R2689 Other abnormalities of gait and mobility: Secondary | ICD-10-CM | POA: Diagnosis not present

## 2019-08-26 DIAGNOSIS — K760 Fatty (change of) liver, not elsewhere classified: Secondary | ICD-10-CM | POA: Diagnosis not present

## 2019-08-26 DIAGNOSIS — J45909 Unspecified asthma, uncomplicated: Secondary | ICD-10-CM | POA: Diagnosis not present

## 2019-08-26 DIAGNOSIS — D539 Nutritional anemia, unspecified: Secondary | ICD-10-CM | POA: Diagnosis not present

## 2019-08-26 DIAGNOSIS — R471 Dysarthria and anarthria: Secondary | ICD-10-CM | POA: Diagnosis not present

## 2019-08-26 DIAGNOSIS — G610 Guillain-Barre syndrome: Secondary | ICD-10-CM | POA: Diagnosis not present

## 2019-08-26 DIAGNOSIS — I10 Essential (primary) hypertension: Secondary | ICD-10-CM | POA: Diagnosis not present

## 2019-08-29 DIAGNOSIS — F1011 Alcohol abuse, in remission: Secondary | ICD-10-CM | POA: Diagnosis not present

## 2019-08-29 DIAGNOSIS — J45909 Unspecified asthma, uncomplicated: Secondary | ICD-10-CM | POA: Diagnosis not present

## 2019-08-29 DIAGNOSIS — I10 Essential (primary) hypertension: Secondary | ICD-10-CM | POA: Diagnosis not present

## 2019-08-29 DIAGNOSIS — K219 Gastro-esophageal reflux disease without esophagitis: Secondary | ICD-10-CM | POA: Diagnosis not present

## 2019-08-29 DIAGNOSIS — R32 Unspecified urinary incontinence: Secondary | ICD-10-CM | POA: Diagnosis not present

## 2019-08-29 DIAGNOSIS — R471 Dysarthria and anarthria: Secondary | ICD-10-CM | POA: Diagnosis not present

## 2019-08-29 DIAGNOSIS — G610 Guillain-Barre syndrome: Secondary | ICD-10-CM | POA: Diagnosis not present

## 2019-08-29 DIAGNOSIS — R2689 Other abnormalities of gait and mobility: Secondary | ICD-10-CM | POA: Diagnosis not present

## 2019-08-29 DIAGNOSIS — D539 Nutritional anemia, unspecified: Secondary | ICD-10-CM | POA: Diagnosis not present

## 2019-08-29 DIAGNOSIS — R296 Repeated falls: Secondary | ICD-10-CM | POA: Diagnosis not present

## 2019-08-29 DIAGNOSIS — G4700 Insomnia, unspecified: Secondary | ICD-10-CM | POA: Diagnosis not present

## 2019-08-29 DIAGNOSIS — K760 Fatty (change of) liver, not elsewhere classified: Secondary | ICD-10-CM | POA: Diagnosis not present

## 2019-08-29 DIAGNOSIS — M4712 Other spondylosis with myelopathy, cervical region: Secondary | ICD-10-CM | POA: Diagnosis not present

## 2019-09-01 DIAGNOSIS — R471 Dysarthria and anarthria: Secondary | ICD-10-CM | POA: Diagnosis not present

## 2019-09-01 DIAGNOSIS — I10 Essential (primary) hypertension: Secondary | ICD-10-CM | POA: Diagnosis not present

## 2019-09-01 DIAGNOSIS — K760 Fatty (change of) liver, not elsewhere classified: Secondary | ICD-10-CM | POA: Diagnosis not present

## 2019-09-01 DIAGNOSIS — G4700 Insomnia, unspecified: Secondary | ICD-10-CM | POA: Diagnosis not present

## 2019-09-01 DIAGNOSIS — J45909 Unspecified asthma, uncomplicated: Secondary | ICD-10-CM | POA: Diagnosis not present

## 2019-09-01 DIAGNOSIS — R0681 Apnea, not elsewhere classified: Secondary | ICD-10-CM | POA: Diagnosis not present

## 2019-09-01 DIAGNOSIS — R32 Unspecified urinary incontinence: Secondary | ICD-10-CM | POA: Diagnosis not present

## 2019-09-01 DIAGNOSIS — G610 Guillain-Barre syndrome: Secondary | ICD-10-CM | POA: Diagnosis not present

## 2019-09-01 DIAGNOSIS — D539 Nutritional anemia, unspecified: Secondary | ICD-10-CM | POA: Diagnosis not present

## 2019-09-01 DIAGNOSIS — R296 Repeated falls: Secondary | ICD-10-CM | POA: Diagnosis not present

## 2019-09-01 DIAGNOSIS — M4712 Other spondylosis with myelopathy, cervical region: Secondary | ICD-10-CM | POA: Diagnosis not present

## 2019-09-01 DIAGNOSIS — R2689 Other abnormalities of gait and mobility: Secondary | ICD-10-CM | POA: Diagnosis not present

## 2019-09-01 DIAGNOSIS — F1011 Alcohol abuse, in remission: Secondary | ICD-10-CM | POA: Diagnosis not present

## 2019-09-01 DIAGNOSIS — K219 Gastro-esophageal reflux disease without esophagitis: Secondary | ICD-10-CM | POA: Diagnosis not present

## 2019-09-01 NOTE — Progress Notes (Addendum)
NEUROLOGY CONSULTATION NOTE  Jorge Weaver. MRN: [id number] DOB: [birth year]  Referring provider: Riki Sheer, DO Primary care provider: Riki Sheer, DO  Reason for consult:  Guillain-Barre Syndrome  HISTORY OF PRESENT ILLNESS: Jorge Weaver is a 54 year old right-handed Black male with cervical spondylosis with myelopathy s/p ACDF at C5-6 and C6-7 in October 2020 and HTN who presents for Guillain-Barre Syndrome.  History supplemented by hospital records and referring provider's notes.  He was admitted to [facility] on 06/27/2019 for progressive numbness and weakness over the course of a couple of months.  No preceding viral illness.  It started numbness in the feet that gradually progressed up the legs until his legs gave out.  MRI of brain with and without contrast was unremarkable.  MRI of cervical spine performed showed status post ACDF at C5-6 and C6-7 with associated cord myelomalacia and resolved spinal stenosis as well as mild multilevel spinal stenosis elsewhere as well as residual cord mass effect due to C5 endplate spurring.  MRI of thoracic spine with and without contrast showed multilevel degenerative changes with mild to moderate spinal stenosis at T9-10 and T10-11 with normal cord morphology.  MRI of lumbar spine with and without contrast showed chronic degenerative disc disease with mild to moderate multifactorial spinal stenosis.  There was nothing requiring surgical intervention as per neurosurgery.  MRI of brain was unremarkable.  He underwent lumbar puncture which demonstrated elevated CSF protein of 102 with cell count 1, and glucose 82 .  He underwent 5 day course of IVIg.  Initial labs also demonstrated a low K+ of 2.2, which was replaced.  He was treated accordingly.  Other labs included negative SARS Coronavirus 2, negative HIV, negative RPR, ceruloplasmin 22.9, TSH 1.777, B1 135, B12 571, MMA 15.2, negative heavy metals panel, low folate 3.2, low iron 40,  vitamin E 8.  Since discharge he has been receiving home PT/OT twice a week.  He reports some mild improvement.  Notes significant numbness in hands and forearms, numbness in feet up to inguinal region.  He is so numb that he cannot pick up and grip objects.  He reports numbness of his face.  Tightness in upper arms.  He notes burning in the hands and feet.  Bladder and bowel function overall okay but has occasional accidents.  He can only ambulate with a walker.  His blood pressure has been running low and experiences orthostasis with dizziness if he gets up too quickly.  He has history of alcohol abuse.  He has not had any alcohol. He underwent lumbar spine surgery last fall.  Currently takes maximum doses of Cymbalta and Lyrica and treats with tramadol for breakthrough pain.  Failed gabapentin.   Imaging: 06/27/2019 MRI BRAIN WO:  IMPRESSION:  No acute intracranial abnormality and largely unremarkable for age MRI appearance of the brain. 06/27/2019 MRI CERVICAL SPINE W WO:  IMPRESSION:  1. 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  2. Status post ACDF at C5-C6 and C6-C7.  Resolved spinal stenosis at C6-C7, and regressed although not resolved abnormal spinal cord signal and enhancement centered at that level. 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  3. Mild cervical spinal stenosis elsewhere, including some residual cord mass effect related to C5 endplate spurring, but no other abnormal cord signal is identified.  4. Degenerative neural foraminal stenosis, up to moderate at the left C5 and C8 nerve levels, and severe at the left C6 nerve level.  06/27/2019 MRI LUMBAR SPINE W WO:  IMPRESSION:  1. Lumbar thecal sac patency has improved since the January MRI at all levels above L4-L5, due to regressed epidural lipomatosis.  2. Chronic disc degeneration at L4-L5 has progressed in the form of new right foraminal annular fissure.  Moderate right L4 foraminal stenosis. Mild to moderate multifactorial spinal stenosis is stable.  3.  Advanced chronic L5-S1 degeneration is stable with mild spinal and lateral recess stenosis, and moderate to severe L5 foraminal stenosis greater on the left. 06/30/2019 MRI THORACIC SPINE W WO:  IMPRESSION:  1. Technically limited exam due to motion artifact.  2. Grossly normal MRI appearance of the thoracic cord with no cord signal abnormality identified. No acute abnormality or findings to explain patient's symptoms identified.  3. Multilevel degenerative disc bulging with facet hypertrophy as above. Resultant mild to moderate spinal stenosis at T9-10 and T10-11.  He is a Teaching laboratory technician.     PAST MEDICAL HISTORY: Past Medical History:  Diagnosis Date  . Anemia    low iron  . Asthma    as a child  . Cervical spondylosis with myelopathy   . Fatty liver   . GERD (gastroesophageal reflux disease)   . Heart murmur    when he was younger   . Hypertension   . Pneumonia     PAST SURGICAL HISTORY: Past Surgical History:  Procedure Laterality Date  . ANTERIOR CERVICAL DECOMP/DISCECTOMY FUSION N/A 01/16/2019   Procedure: Cervical six corpectomy with Cervical five-seven fusion and plating;  Surgeon: Coletta Memos, MD;  Location: [facility] OR;  Service: Neurosurgery;  Laterality: N/A;  . NO PAST SURGERIES    . WISDOM TOOTH EXTRACTION    . WRIST SURGERY Left 2014   fx    MEDICATIONS: Current Outpatient Medications on File Prior to Visit  Medication Sig Dispense Refill  . acetaminophen (TYLENOL) 325 MG tablet Take 1-2 tablets (325-650 mg total) by mouth every 4 (four) hours as needed for mild pain.    Marland Kitchen diclofenac Sodium (VOLTAREN) 1 % GEL Apply 2 g topically 2 (two) times daily. 150 g 0  . DULoxetine (CYMBALTA) 30 MG capsule Take 3 capsules (90 mg total) by mouth at bedtime. 90 capsule 0  . folic acid (FOLVITE) 1 MG tablet Take 1 tablet (1 mg total) by mouth daily. 30 tablet 0  .  hydrALAZINE (APRESOLINE) 50 MG tablet Take 1.5 tablets (75 mg total) by mouth every 8 (eight) hours. 150 tablet 0  . lidocaine (LIDODERM) 5 % Place 2 patches onto the skin daily. On for 12 hours and off for 12 hours--purchase over the counter 30 patch 0  . lidocaine (XYLOCAINE) 5 % ointment Apply topically 4 (four) times daily. To bilateral hands 240 g 0  . magnesium oxide (MAG-OX) 400 (241.3 Mg) MG tablet Take 1 tablet (400 mg total) by mouth 2 (two) times daily. 60 tablet 0  . methocarbamol (ROBAXIN) 500 MG tablet Take 1 tablet (500 mg total) by mouth 3 (three) times daily. 90 tablet 0  . metoprolol tartrate (LOPRESSOR) 100 MG tablet Take 1 tablet (100 mg total) by mouth 2 (two) times daily. 60 tablet 0  . pregabalin (LYRICA) 200 MG capsule Take 1 capsule (200 mg total) by mouth 3 (three) times daily. 90 capsule 0  . senna (SENOKOT) 8.6 MG TABS tablet Take 1 tablet (8.6 mg total) by mouth 2 (two) times daily. 120 tablet 0  . thiamine 100 MG tablet Take 1 tablet (100 mg total) by mouth  daily. 30 tablet 0  . traMADol (ULTRAM) 50 MG tablet Take 1 tablet (50 mg total) by mouth every 6 (six) hours as needed. 30 tablet 0  . traZODone (DESYREL) 50 MG tablet Take 0.5-1 tablets (25-50 mg total) by mouth at bedtime as needed for sleep. 30 tablet 0   No current facility-administered medications on file prior to visit.    ALLERGIES: No Known Allergies  FAMILY HISTORY: Family History  Problem Relation Age of Onset  . Hypertension Mother   . Heart attack Father   . Colon cancer Sister   . Esophageal cancer Neg Hx   . Rectal cancer Neg Hx   . Stomach cancer Neg Hx     SOCIAL HISTORY: Social History   Socioeconomic History  . Marital status: Married    Spouse name: [name]  . Number of children: 5  . Years of education: Not on file  . Highest education level: Associate degree: occupational, Scientist, product/process development, or vocational program  Occupational History    Comment: electrician, heat/AC  Tobacco Use    . Smoking status: Never Smoker  . Smokeless tobacco: Never Used  Vaping Use  . Vaping Use: Never used  Substance and Sexual Activity  . Alcohol use: Yes    Alcohol/week: 3.0 standard drinks    Types: 3 Cans of beer per week  . Drug use: No  . Sexual activity: Not on file  Other Topics Concern  . Not on file  Social History Narrative   Lives with wife, child   Caffeine- occas   Social Determinants of Health   Financial Resource Strain:   . Difficulty of Paying Living Expenses:   Food Insecurity:   . Worried About Programme researcher, broadcasting/film/video in the Last Year:   . Barista in the Last Year:   Transportation Needs:   . Freight forwarder (Medical):   Marland Kitchen Lack of Transportation (Non-Medical):   Physical Activity:   . Days of Exercise per Week:   . Minutes of Exercise per Session:   Stress:   . Feeling of Stress :   Social Connections:   . Frequency of Communication with Friends and Family:   . Frequency of Social Gatherings with Friends and Family:   . Attends Religious Services:   . Active Member of Clubs or Organizations:   . Attends Banker Meetings:   Marland Kitchen Marital Status:   Intimate Partner Violence:   . Fear of Current or Ex-Partner:   . Emotionally Abused:   Marland Kitchen Physically Abused:   . Sexually Abused:     PHYSICAL EXAM: Blood pressure 97/67, pulse 72, resp. rate 18, weight 251 lb (113.9 kg), SpO2 97 %. General: No acute distress.  Patient appears well-groomed.   Head:  Normocephalic/atraumatic Eyes:  fundi examined but not visualized Neck: supple, no paraspinal tenderness, full range of motion Back: No paraspinal tenderness Heart: regular rate and rhythm Lungs: Clear to auscultation bilaterally. Vascular: No carotid bruits. Neurological Exam: Mental status: alert and oriented to person, place, and time, recent and remote memory intact, fund of knowledge intact, attention and concentration intact, speech fluent and not dysarthric, language  intact. Cranial nerves: CN I: not tested CN II: pupils equal, round and reactive to light, visual fields intact CN III, IV, VI:  full range of motion, no nystagmus, no ptosis CN V: facial sensation intact CN VII: upper and lower face symmetric CN VIII: hearing intact CN IX, X: gag intact, uvula midline CN XI: sternocleidomastoid and  trapezius muscles intact CN XII: tongue midline Bulk & Tone: normal, no fasciculations. Motor:  5-/5 hand grip, 3-/5 bilateral hip flexion, 4+/5 left knee flexion, 5-/5 left knee extension, 5-/5 left ankle dorsiflexion, 4+/5 left EHL, 5-/5 right EHL.  Otherwise, 5/5. Sensation:  Pinprick sensation reduced in feet up to inguinal region bilateral; vibration and proprioception sensation loss up to knees. Deep Tendon Reflexes:  2+ upper extremities, absent lower extremities toes downgoing.   Finger to nose testing:  Some difficulty bilaterall Heel to shin:  Unable to perform. Gait:  Requires pushing off with upper extremities to stand.  Wide-based, unsteady.  Difficulty taking steps.  Romberg positive.  IMPRESSION: Acute inflammatory demyelinating polyneuropathy  PLAN: 1.  Will check NCV-EMG to assess severity of neuropathy 2.  Continue PT/OT 3.  Neuralgia control as per pain management. 4.  He is looking into short-term disability. 5.  Follow up after NCV-EMG  Thank you for allowing me to take part in the care of this patient.  Shon Millet, DO  CC: Arva Chafe, DO

## 2019-09-02 ENCOUNTER — Encounter: Payer: Self-pay | Admitting: Neurology

## 2019-09-02 ENCOUNTER — Other Ambulatory Visit: Payer: Self-pay

## 2019-09-02 ENCOUNTER — Ambulatory Visit (INDEPENDENT_AMBULATORY_CARE_PROVIDER_SITE_OTHER): Payer: BC Managed Care – PPO | Admitting: Neurology

## 2019-09-02 ENCOUNTER — Telehealth: Payer: Self-pay

## 2019-09-02 VITALS — BP 97/67 | HR 72 | Resp 18 | Wt 251.0 lb

## 2019-09-02 DIAGNOSIS — G4700 Insomnia, unspecified: Secondary | ICD-10-CM | POA: Diagnosis not present

## 2019-09-02 DIAGNOSIS — F1011 Alcohol abuse, in remission: Secondary | ICD-10-CM | POA: Diagnosis not present

## 2019-09-02 DIAGNOSIS — K219 Gastro-esophageal reflux disease without esophagitis: Secondary | ICD-10-CM | POA: Diagnosis not present

## 2019-09-02 DIAGNOSIS — R2689 Other abnormalities of gait and mobility: Secondary | ICD-10-CM | POA: Diagnosis not present

## 2019-09-02 DIAGNOSIS — K760 Fatty (change of) liver, not elsewhere classified: Secondary | ICD-10-CM | POA: Diagnosis not present

## 2019-09-02 DIAGNOSIS — M4712 Other spondylosis with myelopathy, cervical region: Secondary | ICD-10-CM | POA: Diagnosis not present

## 2019-09-02 DIAGNOSIS — R471 Dysarthria and anarthria: Secondary | ICD-10-CM | POA: Diagnosis not present

## 2019-09-02 DIAGNOSIS — R32 Unspecified urinary incontinence: Secondary | ICD-10-CM | POA: Diagnosis not present

## 2019-09-02 DIAGNOSIS — I10 Essential (primary) hypertension: Secondary | ICD-10-CM | POA: Diagnosis not present

## 2019-09-02 DIAGNOSIS — G610 Guillain-Barre syndrome: Secondary | ICD-10-CM

## 2019-09-02 DIAGNOSIS — J45909 Unspecified asthma, uncomplicated: Secondary | ICD-10-CM | POA: Diagnosis not present

## 2019-09-02 DIAGNOSIS — R296 Repeated falls: Secondary | ICD-10-CM | POA: Diagnosis not present

## 2019-09-02 DIAGNOSIS — D539 Nutritional anemia, unspecified: Secondary | ICD-10-CM | POA: Diagnosis not present

## 2019-09-02 MED ORDER — THIAMINE HCL 100 MG PO TABS: 100.0000 mg | ORAL_TABLET | Freq: Every day | ORAL | 1 refills | Status: DC

## 2019-09-02 MED ORDER — MAGNESIUM OXIDE 400 (241.3 MG) MG PO TABS: 400.0000 mg | ORAL_TABLET | Freq: Two times a day (BID) | ORAL | 1 refills | Status: DC

## 2019-09-02 MED ORDER — DULOXETINE HCL 30 MG PO CPEP: 90.0000 mg | ORAL_CAPSULE | Freq: Every day | ORAL | 1 refills | Status: DC

## 2019-09-02 MED ORDER — METOPROLOL TARTRATE 100 MG PO TABS: 100.0000 mg | ORAL_TABLET | Freq: Two times a day (BID) | ORAL | 1 refills | Status: DC

## 2019-09-02 MED ORDER — PREGABALIN 200 MG PO CAPS: 200.0000 mg | ORAL_CAPSULE | Freq: Three times a day (TID) | ORAL | 0 refills | Status: DC

## 2019-09-02 MED ORDER — SENNA 8.6 MG PO TABS: 1.0000 | ORAL_TABLET | Freq: Two times a day (BID) | ORAL | 0 refills | Status: DC

## 2019-09-02 MED ORDER — TRAMADOL HCL 50 MG PO TABS: 50.0000 mg | ORAL_TABLET | Freq: Four times a day (QID) | ORAL | 0 refills | Status: DC | PRN

## 2019-09-02 MED ORDER — METHOCARBAMOL 500 MG PO TABS: 500.0000 mg | ORAL_TABLET | Freq: Three times a day (TID) | ORAL | 0 refills | Status: DC

## 2019-09-02 MED ORDER — HYDRALAZINE HCL 50 MG PO TABS: 75.0000 mg | ORAL_TABLET | Freq: Three times a day (TID) | ORAL | 0 refills | Status: DC

## 2019-09-02 MED ORDER — FOLIC ACID 1 MG PO TABS: 1.0000 mg | ORAL_TABLET | Freq: Every day | ORAL | 1 refills | Status: DC

## 2019-09-02 NOTE — Patient Instructions (Signed)
1.  We will schedule a nerve conduction study-EMG to assess severity of neuropathy 2.  Continue physical therapy and occupational therapy 3.  Follow up in 3 months   Guillain-Barr Syndrome Guillain-Barr syndrome (GBS) is a rare disorder in which the body's disease-fighting system (immune system) attacks the nerves. This causes numbness and tingling. It can eventually develop into a serious condition and, in some cases, cause paralysis. GBS does not spread from person to person (is not contagious). Most people with GBS recover within a few months. However, some people may have muscle weakness that lasts for a few years. What are the causes? The exact cause of GBS is not known. In most cases, GBS develops a few days or weeks after you have an infection, such as:  A stomach infection caused by Campylobacter bacteria. This type of infection usually causes diarrhea.  An infection of the nose, throat, and upper airways (respiratory infection), such as a cold or the flu.  A viral infection. Less commonly, GBS may be triggered by:  Surgery.  A vaccine. What are the signs or symptoms? The most common first symptoms are tingling, numbness, and weakness in the lower legs. Other symptoms may develop over hours, days, or weeks. These may include:  Muscle weakness that spreads from the legs to the abdomen and arms.  Aching or burning pain.  Weakness of muscles in the face.  Trouble chewing or swallowing.  Slurred speech.  Inability to move the arms, legs, or both (paralysis).  Difficulty breathing. How is this diagnosed? This condition may be diagnosed based on:  Your symptoms and medical history. Your health care provider will ask about any recent infections you have had.  A physical exam.  A test that measures how quickly your nerves carry signals to your muscles (nerve conduction velocity test).  Testing a sample of fluid that surrounds the spinal cord (lumbar puncture). How is  this treated? If your symptoms are mild, you may be given medicines to control your symptoms. If your condition becomes severe, you may need to be treated at a hospital. At the hospital, treatment may include:  Medicines. These help to improve numbness or tingling sensations in your arms and legs (paresthesias) caused by irritation of the nerves.  Plasma exchange (plasmapheresis). This is a procedure in which the immune system cells that are attacking your nerves are removed from your blood.  Getting proteins (immunoglobulins or antibodies) that slow down the immune system's attack on your nerves. These may be given through an IV inserted into one of your veins.  Oxygen and breathing assistance. Treatment may also include physical therapy to help strengthen your muscles. After being treated in the hospital, you may be transferred to a rehabilitation center to get intensive physical therapy. Once your strength improves, you may continue to get physical therapy at home. Additional physical therapy may be needed for many months. Follow these instructions at home:   If physical therapy was prescribed, do exercises as told by your health care provider.  Make sure you have the support you need. Someone may need to help you bathe, dress, and prepare meals until you regain your strength.  Rest as needed. Return to your normal activities as told by your health care provider. Ask your health care provider what activities are safe for you.  Take over-the-counter and prescription medicines only as told by your health care provider.  Keep all follow-up visits as told by your health care provider. This is important. Contact a  health care provider if you:  Have new symptoms or your symptoms get worse.  Do not feel like you are getting better or regaining strength.  Do not feel safe or supported at home.  Are having trouble coping with your recovery. Get help right away if you:  Have trouble  breathing.  Have trouble swallowing.  Develop a fever.  Choke after eating or drinking.  Cannot move.  Faint.  Have pain, swelling, warmth, or redness in an arm or leg.  Have chest pain. Summary  Guillain-Barr syndrome (GBS) is a rare disorder in which the body's disease-fighting system (immune system) attacks the nerves.  GBS often requires treatment at a hospital. Treatment in the hospital may include medicines, plasmapheresis, getting proteins that slow down the immune system's attack on your nerves, or oxygen and breathing assistance.  Months of physical therapy may be needed until you regain your strength. This information is not intended to replace advice given to you by your health care provider. Make sure you discuss any questions you have with your health care provider. Document Revised: 10/04/2018 Document Reviewed: 03/11/2017 Elsevier Patient Education  2020 [employer].

## 2019-09-02 NOTE — Telephone Encounter (Signed)
His wife thinks Rehab may refill some of these

## 2019-09-02 NOTE — Telephone Encounter (Signed)
Refills done/patients wife informed refill done.

## 2019-09-02 NOTE — Telephone Encounter (Signed)
Patient wife called in to get a prescription refill for folic acid (FOLVITE) 1 MG tablet [[id number]]   DULoxetine (CYMBALTA) 30 MG capsule [[id number]]   hydrALAZINE (APRESOLINE) 50 MG tablet [[id number]]   magnesium oxide (MAG-OX) 400 (241.3 Mg) MG tablet [[id number]]   methocarbamol (ROBAXIN) 500 MG tablet [[id number]]   metoprolol tartrate (LOPRESSOR) 100 MG tablet [[id number]]   pregabalin (LYRICA) 200 MG capsule [[id number]]   senna (SENOKOT) 8.6 MG TABS tablet [[id number]]   thiamine 100 MG tablet [[id number]]    traMADol (ULTRAM) 50 MG tablet [[id number]]   Please send it to [id number] [place] - [address] AT [facility] [address]  [address], [place] [address]  Phone:  [phone] Fax:  [phone]  DEA #:  [id number]

## 2019-09-02 NOTE — Telephone Encounter (Signed)
Return Jorge Weaver call, she reports she placed a call ti his PCP and they will refill his medications. She was given the appointment date and time with Dr Berline Chough, she verbalizes understanding.

## 2019-09-02 NOTE — Telephone Encounter (Signed)
Mrs. Schloemer called: Mr. Googe needs an refill on all of his medications.   She has been advised maintenance meds may or may not be refilled. However message will be sent.   Please advise.

## 2019-09-04 DIAGNOSIS — D539 Nutritional anemia, unspecified: Secondary | ICD-10-CM | POA: Diagnosis not present

## 2019-09-04 DIAGNOSIS — J45909 Unspecified asthma, uncomplicated: Secondary | ICD-10-CM | POA: Diagnosis not present

## 2019-09-04 DIAGNOSIS — M4712 Other spondylosis with myelopathy, cervical region: Secondary | ICD-10-CM | POA: Diagnosis not present

## 2019-09-04 DIAGNOSIS — R2689 Other abnormalities of gait and mobility: Secondary | ICD-10-CM | POA: Diagnosis not present

## 2019-09-04 DIAGNOSIS — R32 Unspecified urinary incontinence: Secondary | ICD-10-CM | POA: Diagnosis not present

## 2019-09-04 DIAGNOSIS — R471 Dysarthria and anarthria: Secondary | ICD-10-CM | POA: Diagnosis not present

## 2019-09-04 DIAGNOSIS — G610 Guillain-Barre syndrome: Secondary | ICD-10-CM | POA: Diagnosis not present

## 2019-09-04 DIAGNOSIS — G4700 Insomnia, unspecified: Secondary | ICD-10-CM | POA: Diagnosis not present

## 2019-09-04 DIAGNOSIS — R296 Repeated falls: Secondary | ICD-10-CM | POA: Diagnosis not present

## 2019-09-04 DIAGNOSIS — F1011 Alcohol abuse, in remission: Secondary | ICD-10-CM | POA: Diagnosis not present

## 2019-09-04 DIAGNOSIS — K760 Fatty (change of) liver, not elsewhere classified: Secondary | ICD-10-CM | POA: Diagnosis not present

## 2019-09-04 DIAGNOSIS — I10 Essential (primary) hypertension: Secondary | ICD-10-CM | POA: Diagnosis not present

## 2019-09-04 DIAGNOSIS — K219 Gastro-esophageal reflux disease without esophagitis: Secondary | ICD-10-CM | POA: Diagnosis not present

## 2019-09-05 DIAGNOSIS — R296 Repeated falls: Secondary | ICD-10-CM | POA: Diagnosis not present

## 2019-09-05 DIAGNOSIS — M4712 Other spondylosis with myelopathy, cervical region: Secondary | ICD-10-CM | POA: Diagnosis not present

## 2019-09-05 DIAGNOSIS — G4700 Insomnia, unspecified: Secondary | ICD-10-CM | POA: Diagnosis not present

## 2019-09-05 DIAGNOSIS — G610 Guillain-Barre syndrome: Secondary | ICD-10-CM | POA: Diagnosis not present

## 2019-09-05 DIAGNOSIS — R2689 Other abnormalities of gait and mobility: Secondary | ICD-10-CM | POA: Diagnosis not present

## 2019-09-05 DIAGNOSIS — K219 Gastro-esophageal reflux disease without esophagitis: Secondary | ICD-10-CM | POA: Diagnosis not present

## 2019-09-05 DIAGNOSIS — I10 Essential (primary) hypertension: Secondary | ICD-10-CM | POA: Diagnosis not present

## 2019-09-05 DIAGNOSIS — K760 Fatty (change of) liver, not elsewhere classified: Secondary | ICD-10-CM | POA: Diagnosis not present

## 2019-09-05 DIAGNOSIS — F1011 Alcohol abuse, in remission: Secondary | ICD-10-CM | POA: Diagnosis not present

## 2019-09-05 DIAGNOSIS — D539 Nutritional anemia, unspecified: Secondary | ICD-10-CM | POA: Diagnosis not present

## 2019-09-05 DIAGNOSIS — J45909 Unspecified asthma, uncomplicated: Secondary | ICD-10-CM | POA: Diagnosis not present

## 2019-09-05 DIAGNOSIS — R471 Dysarthria and anarthria: Secondary | ICD-10-CM | POA: Diagnosis not present

## 2019-09-05 DIAGNOSIS — R32 Unspecified urinary incontinence: Secondary | ICD-10-CM | POA: Diagnosis not present

## 2019-09-09 ENCOUNTER — Telehealth: Payer: Self-pay | Admitting: Family Medicine

## 2019-09-09 NOTE — Telephone Encounter (Signed)
OK 

## 2019-09-09 NOTE — Telephone Encounter (Signed)
Caller/Agency: [facility])  Callback Number: [phone]  Requesting OT/PT/Skilled Nursing/Social Work/Speech Therapy: OT  Frequency: 1 time a week for 2 weeks  Need verbal order

## 2019-09-09 NOTE — Telephone Encounter (Signed)
Okay for verbal orders? Please advise 

## 2019-09-09 NOTE — Telephone Encounter (Signed)
VO given to Jorge Weaver at [facility]

## 2019-09-10 DIAGNOSIS — G610 Guillain-Barre syndrome: Secondary | ICD-10-CM | POA: Diagnosis not present

## 2019-09-10 DIAGNOSIS — R2689 Other abnormalities of gait and mobility: Secondary | ICD-10-CM | POA: Diagnosis not present

## 2019-09-10 DIAGNOSIS — D539 Nutritional anemia, unspecified: Secondary | ICD-10-CM | POA: Diagnosis not present

## 2019-09-10 DIAGNOSIS — F1011 Alcohol abuse, in remission: Secondary | ICD-10-CM | POA: Diagnosis not present

## 2019-09-10 DIAGNOSIS — K760 Fatty (change of) liver, not elsewhere classified: Secondary | ICD-10-CM | POA: Diagnosis not present

## 2019-09-10 DIAGNOSIS — R296 Repeated falls: Secondary | ICD-10-CM | POA: Diagnosis not present

## 2019-09-10 DIAGNOSIS — R471 Dysarthria and anarthria: Secondary | ICD-10-CM | POA: Diagnosis not present

## 2019-09-10 DIAGNOSIS — K219 Gastro-esophageal reflux disease without esophagitis: Secondary | ICD-10-CM | POA: Diagnosis not present

## 2019-09-10 DIAGNOSIS — J45909 Unspecified asthma, uncomplicated: Secondary | ICD-10-CM | POA: Diagnosis not present

## 2019-09-10 DIAGNOSIS — M4712 Other spondylosis with myelopathy, cervical region: Secondary | ICD-10-CM | POA: Diagnosis not present

## 2019-09-10 DIAGNOSIS — G4700 Insomnia, unspecified: Secondary | ICD-10-CM | POA: Diagnosis not present

## 2019-09-10 DIAGNOSIS — I10 Essential (primary) hypertension: Secondary | ICD-10-CM | POA: Diagnosis not present

## 2019-09-10 DIAGNOSIS — R32 Unspecified urinary incontinence: Secondary | ICD-10-CM | POA: Diagnosis not present

## 2019-09-12 ENCOUNTER — Encounter: Payer: BC Managed Care – PPO | Attending: Registered Nurse | Admitting: Physical Medicine and Rehabilitation

## 2019-09-12 ENCOUNTER — Other Ambulatory Visit: Payer: Self-pay

## 2019-09-12 ENCOUNTER — Encounter: Payer: Self-pay | Admitting: Physical Medicine and Rehabilitation

## 2019-09-12 VITALS — BP 97/62 | HR 69 | Temp 96.8°F | Ht 71.0 in

## 2019-09-12 DIAGNOSIS — G610 Guillain-Barre syndrome: Secondary | ICD-10-CM | POA: Diagnosis not present

## 2019-09-12 DIAGNOSIS — K592 Neurogenic bowel, not elsewhere classified: Secondary | ICD-10-CM | POA: Insufficient documentation

## 2019-09-12 DIAGNOSIS — Z993 Dependence on wheelchair: Secondary | ICD-10-CM | POA: Diagnosis not present

## 2019-09-12 DIAGNOSIS — R531 Weakness: Secondary | ICD-10-CM | POA: Diagnosis not present

## 2019-09-12 DIAGNOSIS — M792 Neuralgia and neuritis, unspecified: Secondary | ICD-10-CM | POA: Insufficient documentation

## 2019-09-12 MED ORDER — OXCARBAZEPINE 300 MG PO TABS
300.0000 mg | ORAL_TABLET | Freq: Every day | ORAL | 5 refills | Status: DC
Start: 2019-09-12 — End: 2020-03-15

## 2019-09-12 MED ORDER — TRAMADOL HCL 50 MG PO TABS: 50.0000 mg | ORAL_TABLET | Freq: Four times a day (QID) | ORAL | 2 refills | Status: DC | PRN

## 2019-09-12 NOTE — Progress Notes (Signed)
Subjective:    Patient ID: Jorge Glad., male    DOB: [birth year], 54 y.o.   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    Hands -nerve pain is still the same.    Lyrica 200 mg TID Duloxetine 90 mg daily- takes at night Tramadol 1x/day- takes at night- doesn't know if helps hand pain, because goes to sleep after takes it.  And doesn't take every day.  Eases off a little if takes earlier.  Makes him constipated.   Fell yesterday and busted up skin on leg.  Walked with RW  Was OK to walk with RW with someone there.   Having PT and OT coming to the home-1x/week.  Going to put in to extend it.   Watching a lot of TV- bathing himself with small/moderate amount of help- shower chair.  Needs assistance with getting dressed-  If bends over, fell on the floor-   Arms and hands hurt so bad, it's hard to use arms.  Wife doing the dressing mainly.    Saw disability doctor last Saturday - fyi- working on disability.       Pain Inventory Average Pain 7 Pain Right Now 7 My pain is constant, sharp and burning  In the last 24 hours, has pain interfered with the following? General activity 7 Relation with others 0 Enjoyment of life 0 What TIME of day is your pain at its worst? All the time. Sleep (in general) Fair  Pain is worse with: bending, sitting and inactivity Pain improves with: nothing. Relief from Meds: Medication only makes me sleep.  Mobility ability to climb steps?  no do you drive?  no use a wheelchair needs help with transfers Do you have any goals in this area?  yes  Function disabled: date disabled 2012.  Neuro/Psych bladder control problems bowel control problems weakness numbness tremor tingling trouble walking spasms  Prior Studies Any changes since last visit?  no  Physicians involved in your care Any changes since last visit?  no   Family  History  Problem Relation Age of Onset  . Hypertension Mother   . Heart attack Father   . Colon cancer Sister   . Esophageal cancer Neg Hx   . Rectal cancer Neg Hx   . Stomach cancer Neg Hx    Social History   Socioeconomic History  . Marital status: Married    Spouse name: [name]  . Number of children: 5  . Years of education: Not on file  . Highest education level: Associate degree: occupational, Scientist, product/process development, or vocational program  Occupational History    Comment: electrician, heat/AC  Tobacco Use  . Smoking status: Never Smoker  . Smokeless tobacco: Never Used  Vaping Use  . Vaping Use: Never used  Substance and Sexual Activity  . Alcohol use: Yes    Alcohol/week: 3.0 standard drinks    Types: 3 Cans of beer per week  . Drug use: No  . Sexual activity: Not on file  Other Topics Concern  . Not on file  Social History Narrative   Lives with wife, child   Caffeine- occas   Right handed   One story home   Social Determinants of Health   Financial Resource Strain:   . Difficulty of Paying Living Expenses:   Food Insecurity:   . Worried About Programme researcher, broadcasting/film/video in the Last  Year:   . Ran Out of Food in the Last Year:   Transportation Needs:   . Film/video editor (Medical):   Marland Kitchen Lack of Transportation (Non-Medical):   Physical Activity:   . Days of Exercise per Week:   . Minutes of Exercise per Session:   Stress:   . Feeling of Stress :   Social Connections:   . Frequency of Communication with Friends and Family:   . Frequency of Social Gatherings with Friends and Family:   . Attends Religious Services:   . Active Member of Clubs or Organizations:   . Attends Archivist Meetings:   Marland Kitchen Marital Status:    Past Surgical History:  Procedure Laterality Date  . ANTERIOR CERVICAL DECOMP/DISCECTOMY FUSION N/A 01/16/2019   Procedure: Cervical six corpectomy with Cervical five-seven fusion and plating;  Surgeon: Ashok Pall, MD;  Location: [facility];   Service: Neurosurgery;  Laterality: N/A;  . NO PAST SURGERIES    . WISDOM TOOTH EXTRACTION    . WRIST SURGERY Left 2014   fx   Past Medical History:  Diagnosis Date  . Anemia    low iron  . Asthma    as a child  . Cervical spondylosis with myelopathy   . Fatty liver   . GERD (gastroesophageal reflux disease)   . Heart murmur    when he was younger   . Hypertension   . Pneumonia    BP 97/62   Pulse 69   Temp (!) 96.8 F (36 C)   Ht 5\' 11"  (1.803 m)   SpO2 92%   BMI 35.01 kg/m   Opioid Risk Score:   Fall Risk Score:  `1  Depression screen PHQ 2/9  Depression screen [facility] 2/9 09/12/2019 08/14/2019  Decreased Interest 0 0  Down, Depressed, Hopeless 0 0  PHQ - 2 Score 0 0  Altered sleeping 0 0  Tired, decreased energy 0 3  Change in appetite 0 0  Feeling bad or failure about yourself  0 0  Trouble concentrating 0 0  Moving slowly or fidgety/restless 0 0  Suicidal thoughts 0 0  PHQ-9 Score 0 3  Difficult doing work/chores - Not difficult at all   Review of Systems  Constitutional: Negative.   HENT: Negative.   Eyes: Negative.   Respiratory: Negative.   Cardiovascular: Negative.   Gastrointestinal: Positive for constipation.  Endocrine: Negative.   Genitourinary: Negative.   Musculoskeletal: Positive for gait problem.  Skin: Negative.   Allergic/Immunologic: Negative.   Neurological:       Numbness, tinglig  Hematological: Negative.   Psychiatric/Behavioral: Negative.        Objective:   Physical Exam Awake, alert, appropriate, in manual w/c, accompanied by wife, NAD MS: UEs- deltoids, biceps, triceps, WE, grip 4/5- and finger abd 2/5 B/L Decreased hand strength and posturing LEs- HF 2/5, KE 4-/5, KF 4-/5, DF and PF 4/5        Assessment & Plan:  Pt is a 54 yr old with GBS and severe nerve pain and weakness  1. Went over Trileptal, Keppra, and Low dose Naltrexone.   2. Oxcarbemazepine/Trileptal- 300 mg nightly x 1 week, then 600 mg nightly x 1  week then 900 mg nightly- for nerve pain.   3. Once at 900 mg of Oxcarbemazepine, will check BMP/lab to look for low sodium.  Lab slip to anywhere at a [facility] to have done. Do at 3-4 weeks from starting Oxcarbemazepine  4. CONTINUE Duloxetine and Pregabalin UNTIL  gets good results from new medicine- IF gets good results within 3-4 weeks- then can wean Pregabalin- so to wean- 200 mg 2x/day x 3-4 days, then 200 mg nightly x 3-4 days, then stop.   5. Needs refill of Tramadol- tramadol 50 mg q6 hours as needed #90- 2 refills- can cause constipation , so might need over the counter laxatives/stool softeners.   6. Insurance didn't approve lidocaine patches-    7. Can use lidocaine cream on back, but doesn't always penetrate as deeply as patches- can try Salonpas over the counter- which is less expensive today.    8. Discussed prognosis - variability daily is normal- - going to take 18-24 months til he hits his maximal improvement.   9. F/U in 2 months- call to let me know if New meds are helping.   I spent a total of 45 minutes on appointment-as detailed above.

## 2019-09-12 NOTE — Patient Instructions (Addendum)
Pt is a 54 yr old with GBS and severe nerve pain and weakness  1. Went over Trileptal, Keppra, and Low dose Naltrexone.   2. Oxcarbemazepine/Trileptal- 300 mg nightly x 1 week, then 600 mg nightly x 1 week then 900 mg nightly- for nerve pain.   3. Once at 900 mg of Oxcarbemazepine, will check BMP/lab to look for low sodium.  Lab slip to anywhere at a [facility] to have done. In 3-4 weeks- if develops rash you don't recognize, stop immediately.   4. CONTINUE Duloxetine and Pregabalin UNTIL gets good results from new medicine- IF gets good results within 3-4 weeks- then can wean Pregabalin- so to wean- 200 mg 2x/day x 3-4 days, then 200 mg nightly x 3-4 days, then stop.   5. Needs refill of Tramadol- tramadol 50 mg q6 hours as needed #90- 2 refills- can cause constipation , so might need over the counter laxatives/stool softeners.   6. Insurance didn't approve lidocaine patches-    7. Can use lidocaine cream on back, but doesn't always penetrate as deeply as patches- can try Salonpas over the counter- which is less expensive today.    8. Discussed prognosis - variability daily is normal- - going to take 18-24 months til he hits his maximal improvement.   9. F/U in 2 months- call to let me know if New meds are helping.

## 2019-09-17 DIAGNOSIS — I10 Essential (primary) hypertension: Secondary | ICD-10-CM | POA: Diagnosis not present

## 2019-09-17 DIAGNOSIS — R471 Dysarthria and anarthria: Secondary | ICD-10-CM | POA: Diagnosis not present

## 2019-09-17 DIAGNOSIS — D539 Nutritional anemia, unspecified: Secondary | ICD-10-CM | POA: Diagnosis not present

## 2019-09-17 DIAGNOSIS — F1011 Alcohol abuse, in remission: Secondary | ICD-10-CM | POA: Diagnosis not present

## 2019-09-17 DIAGNOSIS — R32 Unspecified urinary incontinence: Secondary | ICD-10-CM | POA: Diagnosis not present

## 2019-09-17 DIAGNOSIS — G4700 Insomnia, unspecified: Secondary | ICD-10-CM | POA: Diagnosis not present

## 2019-09-17 DIAGNOSIS — R296 Repeated falls: Secondary | ICD-10-CM | POA: Diagnosis not present

## 2019-09-17 DIAGNOSIS — G610 Guillain-Barre syndrome: Secondary | ICD-10-CM | POA: Diagnosis not present

## 2019-09-17 DIAGNOSIS — K760 Fatty (change of) liver, not elsewhere classified: Secondary | ICD-10-CM | POA: Diagnosis not present

## 2019-09-17 DIAGNOSIS — K219 Gastro-esophageal reflux disease without esophagitis: Secondary | ICD-10-CM | POA: Diagnosis not present

## 2019-09-17 DIAGNOSIS — R2689 Other abnormalities of gait and mobility: Secondary | ICD-10-CM | POA: Diagnosis not present

## 2019-09-17 DIAGNOSIS — J45909 Unspecified asthma, uncomplicated: Secondary | ICD-10-CM | POA: Diagnosis not present

## 2019-09-17 DIAGNOSIS — M4712 Other spondylosis with myelopathy, cervical region: Secondary | ICD-10-CM | POA: Diagnosis not present

## 2019-09-29 ENCOUNTER — Other Ambulatory Visit: Payer: Self-pay | Admitting: Family Medicine

## 2019-09-29 MED ORDER — METHOCARBAMOL 500 MG PO TABS: 500.0000 mg | ORAL_TABLET | Freq: Three times a day (TID) | ORAL | 0 refills | Status: DC

## 2019-10-01 DIAGNOSIS — I10 Essential (primary) hypertension: Secondary | ICD-10-CM | POA: Diagnosis not present

## 2019-10-01 DIAGNOSIS — K219 Gastro-esophageal reflux disease without esophagitis: Secondary | ICD-10-CM | POA: Diagnosis not present

## 2019-10-01 DIAGNOSIS — K760 Fatty (change of) liver, not elsewhere classified: Secondary | ICD-10-CM | POA: Diagnosis not present

## 2019-10-01 DIAGNOSIS — R0681 Apnea, not elsewhere classified: Secondary | ICD-10-CM | POA: Diagnosis not present

## 2019-10-07 ENCOUNTER — Other Ambulatory Visit: Payer: Self-pay | Admitting: Family Medicine

## 2019-10-07 MED ORDER — PREGABALIN 200 MG PO CAPS: 200.0000 mg | ORAL_CAPSULE | Freq: Three times a day (TID) | ORAL | 0 refills | Status: DC

## 2019-10-07 MED ORDER — HYDRALAZINE HCL 50 MG PO TABS: 75.0000 mg | ORAL_TABLET | Freq: Three times a day (TID) | ORAL | 0 refills | Status: DC

## 2019-10-29 ENCOUNTER — Other Ambulatory Visit: Payer: Self-pay | Admitting: Family Medicine

## 2019-10-30 ENCOUNTER — Other Ambulatory Visit: Payer: Self-pay | Admitting: Family Medicine

## 2019-11-06 ENCOUNTER — Other Ambulatory Visit: Payer: Self-pay | Admitting: Family Medicine

## 2019-11-07 ENCOUNTER — Encounter: Payer: Self-pay | Admitting: Physical Medicine and Rehabilitation

## 2019-11-07 ENCOUNTER — Other Ambulatory Visit: Payer: Self-pay

## 2019-11-07 ENCOUNTER — Encounter: Payer: BC Managed Care – PPO | Attending: Registered Nurse | Admitting: Physical Medicine and Rehabilitation

## 2019-11-07 VITALS — BP 111/75 | HR 76 | Temp 98.2°F | Ht 71.0 in | Wt 270.0 lb

## 2019-11-07 DIAGNOSIS — Z993 Dependence on wheelchair: Secondary | ICD-10-CM | POA: Diagnosis not present

## 2019-11-07 DIAGNOSIS — K592 Neurogenic bowel, not elsewhere classified: Secondary | ICD-10-CM | POA: Diagnosis present

## 2019-11-07 DIAGNOSIS — M792 Neuralgia and neuritis, unspecified: Secondary | ICD-10-CM | POA: Insufficient documentation

## 2019-11-07 DIAGNOSIS — G610 Guillain-Barre syndrome: Secondary | ICD-10-CM | POA: Insufficient documentation

## 2019-11-07 DIAGNOSIS — R531 Weakness: Secondary | ICD-10-CM | POA: Diagnosis present

## 2019-11-07 MED ORDER — METAXALONE 400 MG PO TABS: 400.0000 mg | ORAL_TABLET | Freq: Three times a day (TID) | ORAL | 5 refills | Status: DC | PRN

## 2019-11-07 NOTE — Patient Instructions (Signed)
Pt is a 54 yr old with GBS and severe nerve pain and weakness   1. Change Methocarbemol to Skelaxin- Metaxalone- - pt VERY sedated due to Robaxin. Also got sedated on Flexeril in the hospital- so has failed 2 sedating muscle relaxants.  Will try Skelaxin 400 mg 3x/day as needed- only take if NEEDS it.   2. BMP ordered- to check sodium levels since on Trileptal/Oxcarbemazepine.   3. Can go up to 300 mg in Am and 900 mg Of Oxcarbemazepine- cannot increase until gets BMP/labs done.   4. Took him off Either Duloxetine or Lyrica/pregabalin- call back to let me know which he stopped- doesn't want to restart   5. Will get him back to do B/L shoulder- bicipital tendinitis B/L steroid injections. And possible trigger point injections into upper traps.    6. See if can get insurance to cover 1 OT appointment to get brace for L hand-    7. F/U in 2 or so weeks for injections-

## 2019-11-07 NOTE — Progress Notes (Signed)
Subjective:    Patient ID: Jorge Glad., male    DOB: Jan 30, 1966, 54 y.o.   MRN: [id number]  HPI  Pt is a 54 yr old with GBS and severe nerve pain and weakness here for f/u.   Trileptal- working [employer].   Nerve pain is "going down"- was 7/10 this time- last time marked 9/10.   Weaned off Lyrica-  B/L arms hurts- mainly in shoulders and biceps- hurts to lift above head- like to wash hair,  Put on deodorant, etc.  Like he's worked out-  Really sore.   Gets happy arms and feet- will spasm and lift arms up or kick something. Muscle spasms.    On robaxin- thinks he takes 2x/day-    Insurance stopped paying for outpatient PT/OT.  Got both H/h and outpatient PT.    Still feels like fingers are numb and things drop on floor.   Feels like stabbed with icepicks- feels like frostbite- wants to cry- pain is better, but still an issue.  Doesn't think worth restarting Lyrica- can't feel much, but what does feel is ice cold vs burning hot.   Back pain- still hurting- bad- lidoderm was helpful, but insurance won't cover it.    Also sleeps all the time. Due to medicines.    Larey Seat trying to not use RW- 1 month ago.   Can take a few steps- with RW- ~ 15-20 ft -very wobbly.  Even with RW, not stable   Pain Inventory Average Pain 7 Pain Right Now 7 My pain is burning and aching  In the last 24 hours, has pain interfered with the following? General activity 7 Relation with others 7 Enjoyment of life 7 What TIME of day is your pain at its worst? morning  and night Sleep (in general) Good  Pain is worse with: walking, bending, standing and some activites Pain improves with: rest and medication Relief from Meds: 7  Family History  Problem Relation Age of Onset  . Hypertension Mother   . Heart attack Father   . Colon cancer Sister   . Esophageal cancer Neg Hx   . Rectal cancer Neg Hx   . Stomach cancer Neg Hx    Social History   Socioeconomic History  . Marital  status: Married    Spouse name: Jorge Weaver  . Number of children: 5  . Years of education: Not on file  . Highest education level: Associate degree: occupational, Scientist, product/process development, or vocational program  Occupational History    Comment: electrician, heat/AC  Tobacco Use  . Smoking status: Never Smoker  . Smokeless tobacco: Never Used  Vaping Use  . Vaping Use: Never used  Substance and Sexual Activity  . Alcohol use: Yes    Alcohol/week: 3.0 standard drinks    Types: 3 Cans of beer per week  . Drug use: No  . Sexual activity: Not on file  Other Topics Concern  . Not on file  Social History Narrative   Lives with wife, child   Caffeine- occas   Right handed   One story home   Social Determinants of Health   Financial Resource Strain:   . Difficulty of Paying Living Expenses: Not on file  Food Insecurity:   . Worried About Programme researcher, broadcasting/film/video in the Last Year: Not on file  . Ran Out of Food in the Last Year: Not on file  Transportation Needs:   . Lack of Transportation (Medical): Not on file  . Lack of Transportation (  Non-Medical): Not on file  Physical Activity:   . Days of Exercise per Week: Not on file  . Minutes of Exercise per Session: Not on file  Stress:   . Feeling of Stress : Not on file  Social Connections:   . Frequency of Communication with Friends and Family: Not on file  . Frequency of Social Gatherings with Friends and Family: Not on file  . Attends Religious Services: Not on file  . Active Member of Clubs or Organizations: Not on file  . Attends Banker Meetings: Not on file  . Marital Status: Not on file   Past Surgical History:  Procedure Laterality Date  . ANTERIOR CERVICAL DECOMP/DISCECTOMY FUSION N/A 01/16/2019   Procedure: Cervical six corpectomy with Cervical five-seven fusion and plating;  Surgeon: Coletta Memos, MD;  Location: [facility];  Service: Neurosurgery;  Laterality: N/A;  . NO PAST SURGERIES    . WISDOM TOOTH EXTRACTION    . WRIST  SURGERY Left 2014   fx   Past Surgical History:  Procedure Laterality Date  . ANTERIOR CERVICAL DECOMP/DISCECTOMY FUSION N/A 01/16/2019   Procedure: Cervical six corpectomy with Cervical five-seven fusion and plating;  Surgeon: Coletta Memos, MD;  Location: [facility] OR;  Service: Neurosurgery;  Laterality: N/A;  . NO PAST SURGERIES    . WISDOM TOOTH EXTRACTION    . WRIST SURGERY Left 2014   fx   Past Medical History:  Diagnosis Date  . Anemia    low iron  . Asthma    as a child  . Cervical spondylosis with myelopathy   . Fatty liver   . GERD (gastroesophageal reflux disease)   . Heart murmur    when he was younger   . Hypertension   . Pneumonia    BP 111/75   Pulse 76   Temp 98.2 F (36.8 C)   Ht 5\' 11"  (1.803 m)   Wt 270 lb (122.5 kg)   SpO2 97%   BMI 37.66 kg/m   Opioid Risk Score:   Fall Risk Score:  `1  Depression screen PHQ 2/9  Depression screen [facility] 2/9 09/12/2019 08/14/2019  Decreased Interest 0 0  Down, Depressed, Hopeless 0 0  PHQ - 2 Score 0 0  Altered sleeping 0 0  Tired, decreased energy 0 3  Change in appetite 0 0  Feeling bad or failure about yourself  0 0  Trouble concentrating 0 0  Moving slowly or fidgety/restless 0 0  Suicidal thoughts 0 0  PHQ-9 Score 0 3  Difficult doing work/chores - Not difficult at all    Review of Systems  Musculoskeletal: Positive for gait problem.       Spasms  Neurological: Positive for dizziness, tremors, weakness and numbness.  Psychiatric/Behavioral: Negative for dysphoric mood.  All other systems reviewed and are negative.      Objective:   Physical Exam  Awake alert, appropriate, wife on phone, NAD L hand has cramped up- like PIP's are flexed somewhat- and DIP's are slightly flexed- not really MCP's  Flexing biceps makes the most pain in shoulders B/L- with TTP over anterior AND posterior shoulder Also had tight upper traps, levators and rhomboids.  Doesn't have spasticity per se- I.e (-) Hoffman's (and  doesn't have a cause for spasticity)  However L hand is VERY tight- esp in 3rd digit and less so in thumb and 4th digit.   MS: UEs  Deltoid 3+/5, biceps 4-/5, triceps 4+/5, WE 4/5, grip 4-/5, finger abd  2+/5  LEs; HF 2+/5 on R; 2/5 on L; KE 4-/5, KF 4-/5, DF and PF 4-/5  Neuro: No hoffman's- no clonus- sensation  Face still numb No place on body that sensation is normal R mild facial droop- can catch self drooling.   Extr: trace to 1+ LE edema to ankles B/L       Assessment & Plan:    Pt is a 54 yr old with GBS and severe nerve pain and weakness   1. Change Methocarbemol to Skelaxin- Metaxalone- - pt VERY sedated due to Robaxin. Also got sedated on Flexeril in the hospital- so has failed 2 sedating muscle relaxants.  Will try Skelaxin 400 mg 3x/day as needed- only take if NEEDS it.   2. BMP ordered- to check sodium levels since on Trileptal/Oxcarbemazepine.   3. Can go up to 300 mg in Am and 900 mg Of Oxcarbemazepine- cannot increase until gets BMP/labs done.   4. Took him off Either Duloxetine or Lyrica/pregabalin- call back to let me know which he stopped- doesn't want to restart   5. Will get him back to do B/L shoulder- bicipital tendinitis B/L steroid injections. And possible trigger point injections into upper traps.    6. See if can get insurance to cover 1 OT appointment to get brace for L hand-    7. F/U in 2 or so weeks for injections-   I spent a total of 40 minutes on visit- as detailed above.

## 2019-11-08 LAB — BASIC METABOLIC PANEL
BUN/Creatinine Ratio: 13 (ref 9–20)
BUN: 11 mg/dL (ref 6–24)
CO2: 28 mmol/L (ref 20–29)
Calcium: 9.5 mg/dL (ref 8.7–10.2)
Chloride: 108 mmol/L — ABNORMAL HIGH (ref 96–106)
Creatinine, Ser: 0.83 mg/dL (ref 0.76–1.27)
GFR calc Af Amer: 115 mL/min/{1.73_m2} (ref >59)
GFR calc non Af Amer: 99 mL/min/{1.73_m2} (ref >59)
Glucose: 123 mg/dL — ABNORMAL HIGH (ref 65–99)
Potassium: 4.3 mmol/L (ref 3.5–5.2)
Sodium: 147 mmol/L — ABNORMAL HIGH (ref 134–144)

## 2019-11-10 ENCOUNTER — Telehealth: Payer: Self-pay | Admitting: *Deleted

## 2019-11-10 NOTE — Telephone Encounter (Signed)
Jorge Weaver wife called and medntion something about information about what med he dropped and some lab results. I attempted to reach her back for mor information but no anwer.  Will try again later.

## 2019-11-12 ENCOUNTER — Telehealth: Payer: Self-pay

## 2019-11-12 ENCOUNTER — Ambulatory Visit (INDEPENDENT_AMBULATORY_CARE_PROVIDER_SITE_OTHER): Payer: BC Managed Care – PPO | Admitting: Neurology

## 2019-11-12 DIAGNOSIS — G610 Guillain-Barre syndrome: Secondary | ICD-10-CM | POA: Diagnosis not present

## 2019-11-12 NOTE — Telephone Encounter (Signed)
LMOVM for pt to call back 

## 2019-11-12 NOTE — Procedures (Signed)
[facility]  [address] [place], [address]  [place], [place] [address] Tel: [phone] Fax:  [phone] Test Date:  11/12/2019  Patient: Jorge Weaver, Jorge Weaver. DOB: [birth year] Physician: Nita Sickle, DO  Sex: Male Height: 5\' 11"  Ref Phys: , D.O.  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  NCV & EMG Findings: Extensive electrodiagnostic testing of the right upper and lower extremity shows:  1. Right median and ulnar sensory responses show prolonged latency (R4.0, R3.8 ms) and reduced amplitude (R4.6, R4.1 V).  Right radial, sural, and superficial peroneal sensory responses are absent. 2. Right median motor response shows prolonged latency (4.5 ms).  Right ulnar motor response shows slowed conduction velocity across the elbow (A Elbow-B Elbow, 30 m/s).   3. Right peroneal (EDB) and tibial motor responses are absent.  Right peroneal motor response at the tibialis anterior is within normal limits. 4. Right ulnar F wave is prolonged.  Right tibial H reflex study is absent. 5. Chronic motor axonal loss changes are seen affecting the right flexor digitorum longus, first dorsal interosseous, abductor pollicis brevis, and extensor indicis proprius muscles.  Active denervation is isolated to the first dorsal interosseous muscle.  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     ___________________________ May 2021, DO    Nerve Conduction Studies Anti Sensory Summary Table   Stim Site NR Peak (ms) Norm Peak (ms) P-T Amp (V) Norm P-T Amp  Right Median Anti Sensory (2nd Digit)  33C  Wrist    4.0 <3.6 4.6 >15  Right Radial Anti Sensory (Base 1st Digit)  33C    Wrist NR  <2.7  >14  Right Sup Peroneal Anti Sensory (Ant Lat Mall)  33C  12 cm NR  <4.6  >4  Right Sural Anti Sensory (Lat Mall)  33C  Calf NR  <4.6  >4  Right Ulnar Anti Sensory (5th Digit)  33C  Wrist    3.8 <3.1 4.1 >10   Motor Summary Table   Stim Site NR Onset (ms) Norm Onset (ms) O-P Amp (mV) Norm O-P Amp Site1 Site2 Delta-0 (ms) Dist (cm) Vel (m/s) Norm Vel (m/s)  Right Median Motor (Abd Poll Brev)  33C  Wrist    4.5 <4.0 7.4 >6 Elbow Wrist 5.7 29.0 51 >50  Elbow    10.2  6.8         Right Peroneal Motor (Ext Dig Brev)  33C  Ankle NR  <6.0  >2.5 B Fib Ankle  0.0  >40  B Fib NR     Poplt B Fib  0.0  >40  Poplt NR            Right Peroneal TA Motor (Tib Ant)  33C  Fib Head    3.4 <4.5 4.2 >3 Poplit Fib Head 1.8 10.0 56 >40  Poplit    5.2  4.0         Right Tibial Motor (Abd Hall Brev)  33C  Ankle NR  <6.0  >4 Knee Ankle  0.0  >40  Knee NR            Right Ulnar Motor (Abd Dig Minimi)  33C  Wrist    3.0 <3.1 8.7 >7 B Elbow Wrist 5.4 27.0 50 >50  B Elbow    8.4  8.5  A Elbow B Elbow 3.3 10.0 30 >50  A Elbow    11.7  7.6          F Wave Studies   NR F-Lat (ms) Lat Norm (ms) L-R F-Lat (ms)  Right Ulnar (Mrkrs) (Abd Dig Min)  33C     39.67 <33    H Reflex Studies   NR H-Lat (ms) Lat Norm (ms) L-R H-Lat (ms)  Right Tibial (Gastroc)  33C  NR  <35    EMG   Side Muscle Ins Act Fibs Psw Fasc Number Recrt Dur Dur. Amp Amp. Poly Poly. Comment  Right AntTibialis Nml Nml Nml Nml Nml Nml Nml Nml Nml Nml Nml Nml N/A  Right Gastroc Nml Nml Nml Nml Nml Nml Nml Nml Nml Nml Nml Nml N/A  Right Flex Dig Long Nml Nml Nml Nml 1- Rapid Some 1+ Some 1+ Nml Nml N/A  Right RectFemoris Nml Nml Nml Nml Nml Nml Nml Nml Nml Nml Nml Nml N/A  Right GluteusMed Nml Nml Nml Nml Nml Nml Nml Nml Nml Nml Nml Nml N/A  Right 1stDorInt Nml 1+ Nml Nml 1- Rapid Nml Nml Nml Nml Nml Nml N/A  Right Abd Poll Brev Nml Nml Nml Nml 1- Rapid Nml Nml Nml Nml Nml Nml N/A  Right Ext Indicis Nml Nml Nml  Nml 1- Rapid Nml Nml Nml Nml Nml Nml N/A  Right PronatorTeres Nml Nml Nml Nml Nml Nml Nml Nml Nml Nml Nml Nml N/A  Right Biceps Nml Nml Nml Nml Nml Nml Nml Nml Nml Nml Nml Nml N/A  Right Triceps Nml Nml Nml Nml Nml Nml Nml Nml Nml Nml Nml Nml N/A  Right Deltoid Nml Nml Nml Nml Nml Nml Nml Nml Nml Nml Nml Nml N/A  Right FlexCarpiUln Nml Nml Nml Nml Nml Nml Nml Nml Nml Nml Nml Nml N/A      Waveforms:

## 2019-11-12 NOTE — Telephone Encounter (Signed)
-----   Message from Drema Dallas, DO sent at 11/12/2019  1:05 PM EDT ----- Nerve study does demonstrate severe but chronic neuropathy.  All we can do at this time is to continue physical therapy.  There is still possibility of improvement.  He should keep his follow up appointment with me as I will need to re-evaluate him.

## 2019-11-12 NOTE — Telephone Encounter (Signed)
I have not been able to reach Jorge Weaver. Jorge Weaver has an appt next week in our office. This can be discussed at that time. Encounter will be closed.

## 2019-11-13 ENCOUNTER — Other Ambulatory Visit: Payer: Self-pay | Admitting: Family Medicine

## 2019-11-19 ENCOUNTER — Telehealth: Payer: Self-pay

## 2019-11-19 NOTE — Telephone Encounter (Signed)
EMG results Nerve study does demonstrate severe but chronic neuropathy.  All we can do at this time is to continue physical therapy.  There is still possibility of improvement.  He should keep his follow up appointment with me as I will need to re-evaluate him.   Given to pt wife.    Pt wife wanted to know if the pt could get at home PT. They were advised by his Physical Therapist that he would benefit from more PT at home. Pt uses [facility]. Please advise

## 2019-11-20 ENCOUNTER — Other Ambulatory Visit: Payer: Self-pay

## 2019-11-20 DIAGNOSIS — G959 Disease of spinal cord, unspecified: Secondary | ICD-10-CM

## 2019-11-20 DIAGNOSIS — G610 Guillain-Barre syndrome: Secondary | ICD-10-CM

## 2019-11-20 NOTE — Telephone Encounter (Signed)
OK to order 

## 2019-11-21 ENCOUNTER — Other Ambulatory Visit: Payer: Self-pay

## 2019-11-21 ENCOUNTER — Encounter: Payer: Self-pay | Attending: Registered Nurse | Admitting: Physical Medicine and Rehabilitation

## 2019-11-21 ENCOUNTER — Encounter: Payer: Self-pay | Admitting: Physical Medicine and Rehabilitation

## 2019-11-21 VITALS — BP 117/77 | HR 83 | Temp 98.4°F

## 2019-11-21 DIAGNOSIS — M792 Neuralgia and neuritis, unspecified: Secondary | ICD-10-CM

## 2019-11-21 DIAGNOSIS — R531 Weakness: Secondary | ICD-10-CM | POA: Diagnosis present

## 2019-11-21 DIAGNOSIS — Z993 Dependence on wheelchair: Secondary | ICD-10-CM

## 2019-11-21 DIAGNOSIS — M7918 Myalgia, other site: Secondary | ICD-10-CM

## 2019-11-21 DIAGNOSIS — G610 Guillain-Barre syndrome: Secondary | ICD-10-CM | POA: Diagnosis not present

## 2019-11-21 DIAGNOSIS — K592 Neurogenic bowel, not elsewhere classified: Secondary | ICD-10-CM | POA: Diagnosis present

## 2019-11-21 DIAGNOSIS — M25512 Pain in left shoulder: Secondary | ICD-10-CM

## 2019-11-21 DIAGNOSIS — M25511 Pain in right shoulder: Secondary | ICD-10-CM

## 2019-11-21 NOTE — Progress Notes (Signed)
  Pt is a 53 yr old with GBS and severe nerve pain and weakness   Didn't go up Trileptal Since concerned about how to take AM mornings.    Did stop Duloxetine-   Hasn't noticed much difference with Skelaxin  Took it 1 hour ago- maybe has noticed 10% improvement-  Had a spasm of R foot 15 minutes ago.    Moans a lot more at night. Wasn't doing it in the beginning like this.  In the last week, groaning and grunting a lot- having more pain. Can't roll over, because shoulders won't let him.   He's in hospital bed  Feet have improved in swelling lately.  A little swollen today- but not bad.     Exam: Awake, alert, appropriate, accompanied by wife, NAD LEs 1+ LE edema B/L Trigger points in rhomboids, upper traps, etc.  TTP over RTC/posterior shoulders B/L Has growth on bottom R lip- that's bleeding. - had 3 weeks  Plan: 1. steroid injection was performed at Rotator cuff injections B/L using 1% plain Lidocaine and 40mg  /1cc of Kenalog. This was well tolerated.  Cleaned with betadine x3 and allowed to dry- then alcohol then injected using 27 gauge 1.5 inch needle- no bleeding or complications.    F/U in 3 months for B/L RTC posterior shoulder injections.  Lidocaine will kick in 15 minutes- and wear off tonight- the steroid will kick in tomorrow within 24 hours and take up to 72 hours to fully kick in.  2. Patient here for trigger point injections for  Consent done and on chart.  Cleaned areas with alcohol and injected using a 27 gauge 1.5 inch needle  Injected 6cc Using 1% Lidocaine with no EPI  Upper traps B/L Levators B/L Posterior scalenes B/L Middle scalenes Splenius Capitus Pectoralis Major B/L Rhomboids B/L Infraspinatus Teres Major/minor B/L Thoracic paraspinals B/L Lumbar paraspinals Other injections-    Patient's level of pain prior was 6-7/10 Current level of pain after injections is can raise arms up now- which is new- ~ 5/10 -can move now.   There  was no bleeding or complications.  Patient was advised to drink a lot of water on day after injections to flush system Will have increased soreness for 12-48 hours after injections.  Can use Lidocaine patches the day AFTER injections Can use theracane on day of injections in places didn't inject Can use heating pad 4-6 hours AFTER injections  3. H/H PT and OT since they stopped for no reason it sounds like- also got order from Neurologist, but will continue just in case.   4. Increase Oxcarbemazapine/Trileptal to 300 mg in AM and 900 mg at night- for nerve pain.    5. Had EMG/NCS done by Neurology  6. R lower lip- has growth on it- not sure why, but is bleeding- had ~ 3 weeks- concerning- needs to see PCP.   7. Con't Skelaxin for now- might need Baclofen.   8. Can continue Tramadol- will need opiate contract and  Drug screen, if possible- will be negative since doesn't take often.   9. Can call me in next 1-2 weeks about possible Baclofen for muscle spasms if needs it.   10. Doesn't need refills today.   11. F/U in 4 weeks- for trigger point injections   I spent a total of 45 minutes on appointment- as detailed above.

## 2019-11-21 NOTE — Telephone Encounter (Signed)
LMOVM, Please call back with the Correct [facility].

## 2019-11-21 NOTE — Patient Instructions (Signed)
1. steroid injection was performed at Rotator cuff injections B/L using 1% plain Lidocaine and 40mg  /1cc of Kenalog. This was well tolerated.  Cleaned with betadine x3 and allowed to dry- then alcohol then injected using 27 gauge 1.5 inch needle- no bleeding or complications.    F/U in 3 months for B/L RTC posterior shoulder injections.  Lidocaine will kick in 15 minutes- and wear off tonight- the steroid will kick in tomorrow within 24 hours and take up to 72 hours to fully kick in.  2. Patient here for trigger point injections for  Consent done and on chart.  Cleaned areas with alcohol and injected using a 27 gauge 1.5 inch needle  Injected 6cc Using 1% Lidocaine with no EPI  Upper traps B/L Levators B/L Posterior scalenes B/L Middle scalenes Splenius Capitus Pectoralis Major B/L Rhomboids B/L Infraspinatus Teres Major/minor B/L Thoracic paraspinals B/L Lumbar paraspinals Other injections-    Patient's level of pain prior was 6-7/10 Current level of pain after injections is can raise arms up now- which is new- ~ 5/10 -can move now.   There was no bleeding or complications.  Patient was advised to drink a lot of water on day after injections to flush system Will have increased soreness for 12-48 hours after injections.  Can use Lidocaine patches the day AFTER injections Can use theracane on day of injections in places didn't inject Can use heating pad 4-6 hours AFTER injections  3. H/H PT and OT since they stopped for no reason it sounds like- also got order from Neurologist, but will continue just in case.   4. Increase Oxcarbemazapine/Trileptal to 300 mg in AM and 900 mg at night- for nerve pain.    5. Had EMG/NCS done by Neurology  6. R lower lip- has growth on it- not sure why, but is bleeding- had ~ 3 weeks- concerning- needs to see PCP.   7. Con't Skelaxin for now- might need Baclofen.   8. Can continue Tramadol- will need opiate contract and  Drug screen,  if possible- will be negative since doesn't take often.   9. Can call me in next 1-2 weeks about possible Baclofen for muscle spasms if needs it.   10. Doesn't need refills today.   11. F/U in 4 weeks- for trigger point injections

## 2019-12-02 ENCOUNTER — Other Ambulatory Visit: Payer: Self-pay | Admitting: Family Medicine

## 2019-12-10 ENCOUNTER — Telehealth: Payer: Self-pay | Admitting: Neurology

## 2019-12-10 NOTE — Telephone Encounter (Signed)
Patient's wife doesn't want to use [facility] for PT. Please refer patient somewhere Dr Everlena Cooper would recommend.

## 2019-12-10 NOTE — Telephone Encounter (Signed)
Tried calling pt Wife back, LMOVM.  Per The PT referral needs to be made within 90 days of his last visit. Per pt chart pt last seen 09/02/19.   Once pt comes in 02/09/20, we can added the referral then to [facility] if pt and wife would agree.   Pt Wife returned our call, If pt is unable to see a new Home PT,Is there a way to send the referral to [facility].   Advised Pt Wife to call [facility] to see if they have the same protocols. If not I will send he referral to them, If they do we will discuss at he pt f/u appt.

## 2019-12-10 NOTE — Telephone Encounter (Signed)
Referral faxed to [facility]. Fax number given by wife. [phone]

## 2019-12-12 ENCOUNTER — Other Ambulatory Visit: Payer: Self-pay | Admitting: Family Medicine

## 2019-12-17 ENCOUNTER — Other Ambulatory Visit: Payer: Self-pay

## 2019-12-17 ENCOUNTER — Ambulatory Visit (INDEPENDENT_AMBULATORY_CARE_PROVIDER_SITE_OTHER): Payer: BC Managed Care – PPO | Admitting: Family Medicine

## 2019-12-17 ENCOUNTER — Other Ambulatory Visit: Payer: Self-pay | Admitting: Family Medicine

## 2019-12-17 ENCOUNTER — Encounter: Payer: Self-pay | Admitting: Family Medicine

## 2019-12-17 VITALS — BP 122/72 | HR 61 | Temp 98.0°F | Ht 70.0 in | Wt 289.0 lb

## 2019-12-17 DIAGNOSIS — L980 Pyogenic granuloma: Secondary | ICD-10-CM

## 2019-12-17 DIAGNOSIS — K130 Diseases of lips: Secondary | ICD-10-CM

## 2019-12-17 NOTE — Progress Notes (Addendum)
Chief Complaint  Patient presents with  . lip problem    Jorge Weaver. is a 54 y.o. male here for a skin complaint.  Duration: 1 month Location: lip Pruritic? No Painful? No Drainage? Yes New soaps/lotions/topicals/detergents? No Other associated symptoms: None Therapies tried thus far: None  Past Medical History:  Diagnosis Date  . Anemia    low iron  . Asthma    as a child  . Cervical spondylosis with myelopathy   . Fatty liver   . GERD (gastroesophageal reflux disease)   . Heart murmur    when he was younger   . Hypertension   . Pneumonia     BP 122/72 (BP Location: Left Arm, Patient Position: Sitting, Cuff Size: Normal)   Pulse 61   Temp 98 F (36.7 C) (Oral)   Ht 5\' 10"  (1.778 m)   Wt 289 lb (131.1 kg)   SpO2 97%   BMI 41.47 kg/m  Gen: awake, alert, appearing stated age Lungs: No accessory muscle use Skin: See below. Measures 0.8 cm.  Psych: Age appropriate judgment and insight    Procedure note; shave biopsy Indication: Biopsy, removal of irritating lesion Informed consent was obtained. The area was cleaned with betadine and injected with 0.5 mL of 1% lidocaine with epinephrine. A Dermablade was slightly bent and used to cut under the area of interest. The specimen was placed in a sterile specimen cup and sent to the lab. The area was then cauterized ensuring adequate hemostasis. There were no complications noted. The patient tolerated the procedure well.  Lip lesion - Plan: PR SHAV SKIN LES 0.6-1.0 CM FACE,FACIAL  Orders as above. Warning signs and symptoms verbalized and written down in AVS. Aftercare instructions verbalized and written down.  Await biopsy results.  F/u prn. The patient voiced understanding and agreement to the plan.  [place], DO 12/17/19 10:20 AM

## 2019-12-17 NOTE — Addendum Note (Signed)
Addended by: Scharlene Gloss B on: 12/17/2019 10:55 AM   Modules accepted: Orders

## 2019-12-17 NOTE — Patient Instructions (Signed)
Things to look out for: increasing pain not relieved by ibuprofen/acetaminophen, fevers, spreading redness, drainage of pus, or foul odor.  OK to take Tylenol 1000 mg (2 extra strength tabs) or 975 mg (3 regular strength tabs) every 6 hours as needed.  Give me a week to get the results of your biopsy back.  Let me know if it comes back.  Let us know if you need anything.

## 2019-12-26 ENCOUNTER — Other Ambulatory Visit: Payer: Self-pay

## 2019-12-26 ENCOUNTER — Encounter: Payer: Self-pay | Attending: Registered Nurse | Admitting: Physical Medicine and Rehabilitation

## 2019-12-26 ENCOUNTER — Encounter: Payer: Self-pay | Admitting: Physical Medicine and Rehabilitation

## 2019-12-26 VITALS — BP 110/74 | HR 65 | Temp 97.8°F | Ht 70.0 in | Wt 270.0 lb

## 2019-12-26 DIAGNOSIS — G610 Guillain-Barre syndrome: Secondary | ICD-10-CM | POA: Insufficient documentation

## 2019-12-26 DIAGNOSIS — M792 Neuralgia and neuritis, unspecified: Secondary | ICD-10-CM | POA: Insufficient documentation

## 2019-12-26 DIAGNOSIS — Z993 Dependence on wheelchair: Secondary | ICD-10-CM | POA: Insufficient documentation

## 2019-12-26 DIAGNOSIS — Z79891 Long term (current) use of opiate analgesic: Secondary | ICD-10-CM | POA: Insufficient documentation

## 2019-12-26 DIAGNOSIS — M25512 Pain in left shoulder: Secondary | ICD-10-CM | POA: Insufficient documentation

## 2019-12-26 DIAGNOSIS — G894 Chronic pain syndrome: Secondary | ICD-10-CM | POA: Insufficient documentation

## 2019-12-26 DIAGNOSIS — Z5181 Encounter for therapeutic drug level monitoring: Secondary | ICD-10-CM | POA: Insufficient documentation

## 2019-12-26 DIAGNOSIS — M25511 Pain in right shoulder: Secondary | ICD-10-CM | POA: Insufficient documentation

## 2019-12-26 DIAGNOSIS — R531 Weakness: Secondary | ICD-10-CM | POA: Insufficient documentation

## 2019-12-26 MED ORDER — TRAMADOL HCL 50 MG PO TABS
50.0000 mg | ORAL_TABLET | Freq: Four times a day (QID) | ORAL | 5 refills | Status: DC | PRN
Start: 2019-12-26 — End: 2020-03-29

## 2019-12-26 NOTE — Progress Notes (Signed)
Pt is a 54 yr old with GBS and severe nerve pain and weakness here for follow up.   Had R lower lip lesion taken off 9/29- hard to eat around it. Has worked when got shave biopsy done/cauterized.     Things are the same.  Trigger point injections didn't really work last time.    The shoulder injections helped a little bit, but didn't last long.    Tramadol helps a lot, but tries to avoid it- only take occ/rarely- last time took was 1 month prior.   Main side effect is constipation.   Sleeps all the time- drowsy now.  Thinks still taking Duloxetine a green and white tablet And Lyrica 200 mg TID Not taking Trazodone at bedtime. Hasn't taken since got home.    Takes a few steps and is real wobbly- balance is poor- tries to walk without RW- "like a baby"- has to hold arms out for balance.  Can walk down the hallway WITH the RW- is a little better.  Still having difficulties with strength.   Some toes are broken accordng to daughter- on L foot- trying to grab hospital table- hit them on the bar.    Exam: Awake, alert, appropriate, accompanied by daughter- Jmihyia- in manual w/c, NAD MS: RLE- HF 3/5, KE 4/5; DF and PF 4+/5 LLE- HF 3-/5 KE 4/5, DF 4/5, and PF 4/5  Pain in L foot/toes with palpation.  Didn't go to physician- hasn't taped them- blue 2nd/4th toes and HURTS bad-    Plan: 1. Will prescribe tramadol to take more regularly- will try 2-3x/day.   2. Main side effect is constipation- suggest taking laxative/senna- suggest taking 2 pills/day- either together or separate, whichever you would prefer.   3. Thinks Oxcarbazepine- is tingling badly, but not on fire anymore.  Will continue Oxcarbazepine.   4. D/C skelaxin/Metaxalone  5. Lyrica/Pregabalin 200 mg 2x/day x 4 days, then 200 mg nightly x 4 days- and if pain gets worse, then obviously, need to restart- then increase back to previous doses.    6.  Try taping L toes to toe next to them- to keep them straight,  so don't curl up.   7. Doesn't want trigger point or should injections- weren't that helpful.    8. H/H PT- ordered at last visit- have wife check into this.  [facility]- is the H/H agency.   9. F/U in 6 weeks.   I spent a total of 35 minutes on visit- as detailed above.

## 2019-12-26 NOTE — Patient Instructions (Signed)
Plan: 1. Will prescribe tramadol to take more regularly- will try 2-3x/day.   2. Main side effect is constipation- suggest taking laxative/senna- suggest taking 2 pills/day- either together or separate, whichever you would prefer.   3. Thinks Oxcarbazepine- is tingling badly, but not on fire anymore.  Will continue Oxcarbazepine.   4. D/C skelaxin/Metaxalone  5. Lyrica/Pregabalin 200 mg 2x/day x 4 days, then 200 mg nightly x 4 days- and if pain gets worse, then obviously, need to restart- then increase back to previous doses.    6.  Try taping L toes to toe next to them- to keep them straight, so don't curl up.   7. Doesn't want trigger point or should injections- weren't that helpful.    8. H/H PT- ordered at last visit- have wife check into this.  [facility] is the H/H agency.   9. F/U in 6 weeks.

## 2019-12-29 ENCOUNTER — Telehealth: Payer: Self-pay

## 2019-12-29 NOTE — Telephone Encounter (Signed)
Mrs. Klich called [facility] about  the referral for in home  PT & OT. However there is no one to service [name]. She would like to speak to you about the next step.   Call back ph [phone].

## 2020-01-02 LAB — DRUG TOX MONITOR 1 W/CONF, ORAL FLD
Amphetamines: NEGATIVE ng/mL (ref <10)
Barbiturates: NEGATIVE ng/mL (ref <10)
Benzodiazepines: NEGATIVE ng/mL (ref <0.50)
Buprenorphine: NEGATIVE ng/mL (ref <0.10)
Cocaine: NEGATIVE ng/mL (ref <5.0)
Fentanyl: NEGATIVE ng/mL (ref <0.10)
Heroin Metabolite: NEGATIVE ng/mL (ref <1.0)
MARIJUANA: NEGATIVE ng/mL (ref <2.5)
MDMA: NEGATIVE ng/mL (ref <10)
Meprobamate: NEGATIVE ng/mL (ref <2.5)
Methadone: NEGATIVE ng/mL (ref <5.0)
Nicotine Metabolite: NEGATIVE ng/mL (ref <5.0)
Opiates: NEGATIVE ng/mL (ref <2.5)
Phencyclidine: NEGATIVE ng/mL (ref <10)
Tapentadol: NEGATIVE ng/mL (ref <5.0)
Tramadol: 86.1 ng/mL — ABNORMAL HIGH (ref <5.0)
Tramadol: POSITIVE ng/mL — AB (ref <5.0)
Zolpidem: NEGATIVE ng/mL (ref <5.0)

## 2020-01-02 LAB — DRUG TOX ALC METAB W/CON, ORAL FLD: Alcohol Metabolite: NEGATIVE ng/mL (ref <25)

## 2020-01-02 NOTE — Telephone Encounter (Signed)
We need to find if there's any other H/H agency that can see him- please see if April can help [place] find another H/h agency for pt- because he's homebound and there's no one to take to Outpt PT appointments.

## 2020-01-05 ENCOUNTER — Other Ambulatory Visit: Payer: Self-pay | Admitting: Family Medicine

## 2020-01-05 NOTE — Telephone Encounter (Signed)
I spoke to April and he does not have insurance coverage. So that maybe the reason why. He maybe able get facility PT & OT as self pay.

## 2020-01-06 ENCOUNTER — Telehealth: Payer: Self-pay | Admitting: *Deleted

## 2020-01-06 NOTE — Telephone Encounter (Signed)
Oral swab drug screen was consistent for prescribed medications.  ?

## 2020-01-20 ENCOUNTER — Other Ambulatory Visit: Payer: Self-pay | Admitting: Family Medicine

## 2020-02-06 ENCOUNTER — Encounter: Payer: Self-pay | Admitting: Physical Medicine and Rehabilitation

## 2020-02-06 NOTE — Progress Notes (Deleted)
NEUROLOGY FOLLOW UP OFFICE NOTE  Pawan Knechtel [id number]   Subjective:  Jorge Weaver is a 54 year old right-handed Black male with cervical spondylosis with myelopathy s/p ACDF at C5-6 and C6-7 in October 2020 and HTN who follows up for Guillain-Barre syndrome.  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  He has continued PT/OT ***  HISTORY: He was admitted to [facility] on 06/27/2019 for progressive numbness and weakness over the course of a couple of months.  No preceding viral illness.  It started numbness in the feet that gradually progressed up the legs until his legs gave out.  MRI of brain with and without contrast was unremarkable.  MRI of cervical spine performed showed status post ACDF at C5-6 and C6-7 with associated cord myelomalacia and resolved spinal stenosis as well as mild multilevel spinal stenosis elsewhere as well as residual cord mass effect due to C5 endplate spurring.  MRI of thoracic spine with and without contrast showed multilevel degenerative changes with mild to moderate spinal stenosis at T9-10 and T10-11 with normal cord morphology.  MRI of lumbar spine with and without contrast showed chronic degenerative disc disease with mild to moderate multifactorial spinal stenosis.  There was nothing requiring surgical intervention as per neurosurgery.  MRI of brain was unremarkable.  He underwent lumbar puncture which demonstrated elevated CSF protein of 102 with cell count 1, and glucose 82 .  He underwent 5 day course of IVIg.  Initial labs also demonstrated a low K+ of 2.2, which was replaced.  He was treated accordingly.  Other labs included negative SARS Coronavirus 2, negative HIV, negative RPR, ceruloplasmin 22.9, TSH 1.777, B1 135, B12 571, MMA 15.2, negative heavy metals panel, low folate 3.2, low iron 40, vitamin E 8.  Since discharge he has been receiving home  PT/OT twice a week.  He reports some mild improvement.  Notes significant numbness in hands and forearms, numbness in feet up to inguinal region.  He is so numb that he cannot pick up and grip objects.  He reports numbness of his face.  Tightness in upper arms.  He notes burning in the hands and feet.  Bladder and bowel function overall okay but has occasional accidents.  He can only ambulate with a walker.  His blood pressure has been running low and experiences orthostasis with dizziness if he gets up too quickly.  He has history of alcohol abuse.  He has not had any alcohol. He underwent lumbar spine surgery last fall.  Currently takes maximum doses of Cymbalta and Lyrica and treats with tramadol for breakthrough pain.  Failed gabapentin.   Imaging: 06/27/2019 MRI BRAIN WO:  IMPRESSION:  No acute intracranial abnormality and largely unremarkable for age MRI appearance of the brain. 06/27/2019 MRI CERVICAL SPINE W WO:  IMPRESSION:  1. 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  2. Status post ACDF at C5-C6 and C6-C7.  Resolved spinal stenosis at C6-C7, and regressed although not resolved abnormal spinal cord signal and enhancement centered at that level. 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  3. Mild cervical spinal stenosis elsewhere, including some residual cord mass effect related to C5 endplate spurring, but no other abnormal cord signal is identified.  4. Degenerative neural foraminal stenosis, up to moderate at the left C5 and C8 nerve levels, and severe at the left C6 nerve level. 06/27/2019 MRI LUMBAR SPINE W  WO:  IMPRESSION:  1. Lumbar thecal sac patency has improved since the January MRI at all levels above L4-L5, due to regressed epidural lipomatosis.  2. Chronic disc degeneration at L4-L5 has progressed in the form of new right foraminal annular fissure. Moderate right L4 foraminal stenosis. Mild to moderate  multifactorial spinal stenosis is stable.  3.  Advanced chronic L5-S1 degeneration is stable with mild spinal and lateral recess stenosis, and moderate to severe L5 foraminal stenosis greater on the left. 06/30/2019 MRI THORACIC SPINE W WO:  IMPRESSION:  1. Technically limited exam due to motion artifact.  2. Grossly normal MRI appearance of the thoracic cord with no cord signal abnormality identified. No acute abnormality or findings to explain patient's symptoms identified.  3. Multilevel degenerative disc bulging with facet hypertrophy as above. Resultant mild to moderate spinal stenosis at T9-10 and T10-11.  He is a Teaching laboratory technician.    PAST MEDICAL HISTORY: Past Medical History:  Diagnosis Date  . Anemia    low iron  . Asthma    as a child  . Cervical spondylosis with myelopathy   . Fatty liver   . GERD (gastroesophageal reflux disease)   . Heart murmur    when he was younger   . Hypertension   . Pneumonia     MEDICATIONS: Current Outpatient Medications on File Prior to Visit  Medication Sig Dispense Refill  . acetaminophen (TYLENOL) 325 MG tablet Take 1-2 tablets (325-650 mg total) by mouth every 4 (four) hours as needed for mild pain.    . DULoxetine (CYMBALTA) 30 MG capsule Take 3 capsules (90 mg total) by mouth at bedtime. 90 capsule 1  . folic acid (FOLVITE) 1 MG tablet TAKE 1 TABLET(1 MG) BY MOUTH DAILY 30 tablet 1  . hydrALAZINE (APRESOLINE) 50 MG tablet TAKE 1 AND 1/2 TABLETS(75 MG) BY MOUTH EVERY 8 HOURS 150 tablet 0  . MAGNESIUM-OXIDE 400 (241.3 Mg) MG tablet TAKE 1 TABLET(400 MG) BY MOUTH TWICE DAILY 60 tablet 0  . metaxalone (SKELAXIN) 400 MG tablet Take 1 tablet (400 mg total) by mouth 3 (three) times daily as needed. 90 tablet 5  . methocarbamol (ROBAXIN) 500 MG tablet TAKE 1 TABLET(500 MG) BY MOUTH THREE TIMES DAILY 90 tablet 0  . metoprolol tartrate (LOPRESSOR) 100 MG tablet TAKE 1 TABLET(100 MG) BY MOUTH TWICE DAILY 60 tablet 1  . Oxcarbazepine (TRILEPTAL)  300 MG tablet Take 1 tablet (300 mg total) by mouth at bedtime. X 1 week then 600 mg nightly x 1 week then 900 mg nightly- for nerve pain. 90 tablet 5  . pregabalin (LYRICA) 200 MG capsule TAKE 1 CAPSULE(200 MG) BY MOUTH THREE TIMES DAILY 90 capsule 5  . senna (SENOKOT) 8.6 MG TABS tablet TAKE 1 TABLET(8.6 MG) BY MOUTH TWICE DAILY 60 tablet 0  . thiamine 100 MG tablet TAKE 1 TABLET(100 MG) BY MOUTH DAILY 30 tablet 0  . traMADol (ULTRAM) 50 MG tablet Take 1 tablet (50 mg total) by mouth every 6 (six) hours as needed. 90 tablet 5  . traZODone (DESYREL) 50 MG tablet Take 0.5-1 tablets (25-50 mg total) by mouth at bedtime as needed for sleep. 30 tablet 0   No current facility-administered medications on file prior to visit.    ALLERGIES: No Known Allergies  FAMILY HISTORY: Family History  Problem Relation Age of Onset  . Hypertension Mother   . Heart attack Father   . Colon cancer Sister   . Esophageal cancer Neg Hx   .  Rectal cancer Neg Hx   . Stomach cancer Neg Hx    ***.  SOCIAL HISTORY: Social History   Socioeconomic History  . Marital status: Married    Spouse name: [name]  . Number of children: 5  . Years of education: Not on file  . Highest education level: Associate degree: occupational, Scientist, product/process development, or vocational program  Occupational History    Comment: electrician, heat/AC  Tobacco Use  . Smoking status: Never Smoker  . Smokeless tobacco: Never Used  Vaping Use  . Vaping Use: Never used  Substance and Sexual Activity  . Alcohol use: Yes    Alcohol/week: 3.0 standard drinks    Types: 3 Cans of beer per week  . Drug use: No  . Sexual activity: Not on file  Other Topics Concern  . Not on file  Social History Narrative   Lives with wife, child   Caffeine- occas   Right handed   One story home   Social Determinants of Health   Financial Resource Strain:   . Difficulty of Paying Living Expenses: Not on file  Food Insecurity:   . Worried About Brewing technologist in the Last Year: Not on file  . Ran Out of Food in the Last Year: Not on file  Transportation Needs:   . Lack of Transportation (Medical): Not on file  . Lack of Transportation (Non-Medical): Not on file  Physical Activity:   . Days of Exercise per Week: Not on file  . Minutes of Exercise per Session: Not on file  Stress:   . Feeling of Stress : Not on file  Social Connections:   . Frequency of Communication with Friends and Family: Not on file  . Frequency of Social Gatherings with Friends and Family: Not on file  . Attends Religious Services: Not on file  . Active Member of Clubs or Organizations: Not on file  . Attends Banker Meetings: Not on file  . Marital Status: Not on file  Intimate Partner Violence:   . Fear of Current or Ex-Partner: Not on file  . Emotionally Abused: Not on file  . Physically Abused: Not on file  . Sexually Abused: Not on file     Objective:  *** General: No acute distress.  Patient appears ***-groomed.   Head:  Normocephalic/atraumatic Eyes:  Fundi examined but not visualized Neck: supple, no paraspinal tenderness, full range of motion Heart:  Regular rate and rhythm Lungs:  Clear to auscultation bilaterally Back: No paraspinal tenderness Neurological Exam: alert and oriented to person, place, and time. Attention span and concentration intact, recent and remote memory intact, fund of knowledge intact.  Speech fluent and not dysarthric, language intact.  CN II-XII intact. Bulk and tone normal, muscle strength 5/5 throughout.  Sensation to light touch, temperature and vibration intact.  Deep tendon reflexes 2+ throughout, toes downgoing.  Finger to nose and heel to shin testing intact.  Gait normal, Romberg negative.   Assessment/Plan:   ***  Shon Millet, DO  CC: ***

## 2020-02-09 ENCOUNTER — Encounter: Payer: Self-pay | Admitting: Neurology

## 2020-02-09 ENCOUNTER — Ambulatory Visit: Payer: BC Managed Care – PPO | Admitting: Neurology

## 2020-02-09 DIAGNOSIS — Z029 Encounter for administrative examinations, unspecified: Secondary | ICD-10-CM

## 2020-02-10 ENCOUNTER — Other Ambulatory Visit: Payer: Self-pay | Admitting: Family Medicine

## 2020-02-17 ENCOUNTER — Other Ambulatory Visit: Payer: Self-pay | Admitting: Family Medicine

## 2020-02-24 ENCOUNTER — Other Ambulatory Visit: Payer: Self-pay | Admitting: Family Medicine

## 2020-03-15 ENCOUNTER — Other Ambulatory Visit: Payer: Self-pay | Admitting: Physical Medicine and Rehabilitation

## 2020-03-15 ENCOUNTER — Other Ambulatory Visit: Payer: Self-pay | Admitting: Family Medicine

## 2020-03-29 ENCOUNTER — Encounter: Payer: Self-pay | Admitting: Physical Medicine and Rehabilitation

## 2020-03-29 ENCOUNTER — Encounter: Payer: Self-pay | Attending: Registered Nurse | Admitting: Physical Medicine and Rehabilitation

## 2020-03-29 ENCOUNTER — Other Ambulatory Visit: Payer: Self-pay

## 2020-03-29 VITALS — BP 144/95 | HR 59 | Temp 98.8°F | Ht 70.0 in | Wt 270.0 lb

## 2020-03-29 DIAGNOSIS — G894 Chronic pain syndrome: Secondary | ICD-10-CM | POA: Insufficient documentation

## 2020-03-29 DIAGNOSIS — M792 Neuralgia and neuritis, unspecified: Secondary | ICD-10-CM | POA: Insufficient documentation

## 2020-03-29 DIAGNOSIS — G610 Guillain-Barre syndrome: Secondary | ICD-10-CM | POA: Insufficient documentation

## 2020-03-29 MED ORDER — TRAMADOL HCL 50 MG PO TABS: 50.0000 mg | ORAL_TABLET | Freq: Four times a day (QID) | ORAL | 5 refills | Status: DC | PRN

## 2020-03-29 NOTE — Patient Instructions (Signed)
  1. [facility]- [phone]- [phone]-  [phone] [address] Start with 3 mg daily- increase after 1 week to 6mg  daily- they will call when it's ready- 1 month's supply  2. Tramadol 50 mg 4x/day as needed #90 with 5 RFs  3. Have to call or send me an email at 3 weeks to let me know how it's going.   4. Continue the oxcarbemazipine- 600 mg nightly- if pain relief MUCH better with low dose naltrexone- CAN wean off it- if weans, go to 300 mg nightly x 1 week, then stop.   5. Don't have to wait for f/u to let me know how things are going.   6. F/U in 4 weeks.

## 2020-03-29 NOTE — Progress Notes (Signed)
Subjective:    Patient ID: Jorge Glad., male    DOB: 26-Mar-1965, 55 y.o.   MRN: [id number]  HPI  Pt is a 55 yr old with GBS and severe nerve pain and weakness here for follow up.  Pain about the same Cut Oxcarbemazapine to 600 mg nightly due to feet swelling real bad. Fine ever since.   Off Lyrica. Due to cost!   Needs tramadol- has been taking more regularly-  And helps a little, but ran out of funds to refill- has 3 pills left.    Pain so much everywhere, not walking much.    Pain Inventory Average Pain 9 Pain Right Now 9 My pain is sharp  In the last 24 hours, has pain interfered with the following? General activity 6 Relation with others 5 Enjoyment of life 5 What TIME of day is your pain at its worst? morning , daytime, evening and night Sleep (in general) Fair  Pain is worse with: walking, bending, sitting and standing Pain improves with: medication Relief from Meds: 3  Family History  Problem Relation Age of Onset  . Hypertension Mother   . Heart attack Father   . Colon cancer Sister   . Esophageal cancer Neg Hx   . Rectal cancer Neg Hx   . Stomach cancer Neg Hx    Social History   Socioeconomic History  . Marital status: Married    Spouse name: Jorge Weaver  . Number of children: 5  . Years of education: Not on file  . Highest education level: Associate degree: occupational, Scientist, product/process development, or vocational program  Occupational History    Comment: electrician, heat/AC  Tobacco Use  . Smoking status: Never Smoker  . Smokeless tobacco: Never Used  Vaping Use  . Vaping Use: Never used  Substance and Sexual Activity  . Alcohol use: Yes    Alcohol/week: 3.0 standard drinks    Types: 3 Cans of beer per week  . Drug use: No  . Sexual activity: Not on file  Other Topics Concern  . Not on file  Social History Narrative   Lives with wife, child   Caffeine- occas   Right handed   One story home   Social Determinants of Health   Financial Resource  Strain: Not on file  Food Insecurity: Not on file  Transportation Needs: Not on file  Physical Activity: Not on file  Stress: Not on file  Social Connections: Not on file   Past Surgical History:  Procedure Laterality Date  . ANTERIOR CERVICAL DECOMP/DISCECTOMY FUSION N/A 01/16/2019   Procedure: Cervical six corpectomy with Cervical five-seven fusion and plating;  Surgeon: Coletta Memos, MD;  Location: [facility] OR;  Service: Neurosurgery;  Laterality: N/A;  . NO PAST SURGERIES    . WISDOM TOOTH EXTRACTION    . WRIST SURGERY Left 2014   fx   Past Surgical History:  Procedure Laterality Date  . ANTERIOR CERVICAL DECOMP/DISCECTOMY FUSION N/A 01/16/2019   Procedure: Cervical six corpectomy with Cervical five-seven fusion and plating;  Surgeon: Coletta Memos, MD;  Location: [facility] OR;  Service: Neurosurgery;  Laterality: N/A;  . NO PAST SURGERIES    . WISDOM TOOTH EXTRACTION    . WRIST SURGERY Left 2014   fx   Past Medical History:  Diagnosis Date  . Anemia    low iron  . Asthma    as a child  . Cervical spondylosis with myelopathy   . Fatty liver   . GERD (gastroesophageal reflux disease)   .  Heart murmur    when he was younger   . Hypertension   . Pneumonia    BP (!) 144/95   Pulse (!) 59   Temp 98.8 F (37.1 C)   Ht 5\' 10"  (1.778 m)   Wt 270 lb (122.5 kg)   SpO2 94%   BMI 38.74 kg/m   Opioid Risk Score:   Fall Risk Score:  `1  Depression screen PHQ 2/9  Depression screen [facility] 2/9 12/26/2019 09/12/2019 08/14/2019  Decreased Interest 1 0 0  Down, Depressed, Hopeless 1 0 0  PHQ - 2 Score 2 0 0  Altered sleeping - 0 0  Tired, decreased energy - 0 3  Change in appetite - 0 0  Feeling bad or failure about yourself  - 0 0  Trouble concentrating - 0 0  Moving slowly or fidgety/restless - 0 0  Suicidal thoughts - 0 0  PHQ-9 Score - 0 3  Difficult doing work/chores - - Not difficult at all    Review of Systems  Constitutional: Negative.   HENT: Negative.   Eyes:  Negative.   Respiratory: Negative.   Cardiovascular: Negative.   Gastrointestinal: Negative.   Endocrine: Negative.   Genitourinary: Negative.   Musculoskeletal: Positive for myalgias.  Skin: Negative.   Allergic/Immunologic: Negative.   Neurological: Negative.        Neuropathy  Hematological: Negative.   Psychiatric/Behavioral: Negative.   All other systems reviewed and are negative.      Objective:   Physical Exam  Awake, alert, appropriate, in w/c, NAD Wife on phone Walking some with Single point cane.       Assessment & Plan:   Pt is a 55 yr old with GBS and severe nerve pain and weakness here for follow up.   1. [facility]- [phone]- [phone]-  [phone] [address] Start with 3 mg daily- increase after 1 week to 6mg  daily- they will call when it's ready- 1 month's supply- calle din supply while in room with pt-   2. Tramadol 50 mg 4x/day as needed #90 with 5 RFs  3. Have to call or send me an email at 3 weeks to let me know how it's going.   4. Continue the oxcarbemazipine- 600 mg nightly- if pain relief MUCH better with low dose naltrexone- CAN wean off it- if weans, go to 300 mg nightly x 1 week, then stop.   5. Don't have to wait for f/u to let me know how things are going.   6. F/U in 4 weeks.   I spent a total of 25 minutes on visit- as detailed above.

## 2020-04-16 ENCOUNTER — Other Ambulatory Visit: Payer: Self-pay | Admitting: Family Medicine

## 2020-04-23 ENCOUNTER — Other Ambulatory Visit: Payer: Self-pay | Admitting: Family Medicine

## 2020-04-23 MED ORDER — SENNA 8.6 MG PO TABS
ORAL_TABLET | ORAL | 6 refills | Status: DC
Start: 2020-04-23 — End: 2021-04-15

## 2020-05-03 ENCOUNTER — Encounter: Payer: Self-pay | Attending: Registered Nurse | Admitting: Physical Medicine and Rehabilitation

## 2020-05-03 DIAGNOSIS — G894 Chronic pain syndrome: Secondary | ICD-10-CM | POA: Insufficient documentation

## 2020-05-03 DIAGNOSIS — G610 Guillain-Barre syndrome: Secondary | ICD-10-CM | POA: Insufficient documentation

## 2020-05-03 DIAGNOSIS — M792 Neuralgia and neuritis, unspecified: Secondary | ICD-10-CM | POA: Insufficient documentation

## 2020-05-10 NOTE — Telephone Encounter (Signed)
Encounter opened in error

## 2020-06-04 ENCOUNTER — Telehealth: Payer: Self-pay

## 2020-06-04 NOTE — Telephone Encounter (Signed)
[facility] called stating patient needs a refill on Naltrexone 3 mg cas #44 on 03/29/20 last filled.

## 2020-06-04 NOTE — Telephone Encounter (Signed)
Have called in Naltrexone 6 mg daily 30 day supply with 5 refills- thank you, ML

## 2020-07-19 ENCOUNTER — Other Ambulatory Visit: Payer: Self-pay | Admitting: Family Medicine

## 2020-09-06 ENCOUNTER — Other Ambulatory Visit: Payer: Self-pay

## 2020-09-06 ENCOUNTER — Encounter: Payer: Self-pay | Attending: Physical Medicine and Rehabilitation | Admitting: Physical Medicine and Rehabilitation

## 2020-09-06 ENCOUNTER — Encounter: Payer: Self-pay | Admitting: Physical Medicine and Rehabilitation

## 2020-09-06 VITALS — BP 133/91 | HR 73 | Temp 98.4°F | Ht 70.0 in | Wt 267.0 lb

## 2020-09-06 DIAGNOSIS — G610 Guillain-Barre syndrome: Secondary | ICD-10-CM | POA: Insufficient documentation

## 2020-09-06 DIAGNOSIS — M792 Neuralgia and neuritis, unspecified: Secondary | ICD-10-CM | POA: Insufficient documentation

## 2020-09-06 DIAGNOSIS — G894 Chronic pain syndrome: Secondary | ICD-10-CM | POA: Insufficient documentation

## 2020-09-06 MED ORDER — OXCARBAZEPINE 300 MG PO TABS: 900.0000 mg | ORAL_TABLET | Freq: Every day | ORAL | 5 refills | Status: DC

## 2020-09-06 MED ORDER — TRAMADOL HCL 50 MG PO TABS: 50.0000 mg | ORAL_TABLET | Freq: Four times a day (QID) | ORAL | 5 refills | Status: DC | PRN

## 2020-09-06 MED ORDER — CYCLOBENZAPRINE HCL 10 MG PO TABS: 10.0000 mg | ORAL_TABLET | Freq: Three times a day (TID) | ORAL | 5 refills | Status: DC | PRN

## 2020-09-06 NOTE — Progress Notes (Signed)
Subjective:    Patient ID: Jorge Glad., male    DOB: 1965/04/19, 55 y.o.   MRN: [id number]  HPI Pt is a 55 yr old with GBS and severe nerve pain and weakness here for follow up for his GBS.   Is walking now , but does use a cane sometimes.  Balance still not great/not perfect!  Has fallen some- 2x- since I last saw him-  Was trying to walk up driveway which is pretty steep. Both times.   Tried Low dose naltrexone- didn't help at all.   Took tramadol-last time Thursday- tries to not take tramadol often- does need refill- has been since January since last filled.   Still on Trileptal 600 mg QHS-   Having spasms- which is worse- taking Robaxin daily- maybe 2x/day- usually takes in  morning-  Spasms all day.  Would like a different spasm medicine.   Bowel and bladder doing OK.   Has walked the grocery store-not [employer]-. Usually uses scooter at grocery store. Doing short distances, not long distances.   Pain Inventory Average Pain 8 Pain Right Now 5 My pain is intermittent, constant, burning, and tingling  LOCATION OF PAIN  bilateral arm and leg, back  BOWEL Number of stools per week: 3-4 Oral laxative use Yes  Type of laxative sennakot Enema or suppository use No  History of colostomy No  Incontinent No   BLADDER Normal In and out cath, frequency na Able to self cath  na Bladder incontinence No  Frequent urination No  Leakage with coughing No  Difficulty starting stream No  Incomplete bladder emptying No    Mobility walk without assistance walk with assistance use a cane use a walker how many minutes can you walk? 5 ability to climb steps?  yes do you drive?  yes  Function not employed: date last employed . I need assistance with the following:  dressing, bathing, meal prep, household duties, and shopping  Neuro/Psych weakness numbness tingling trouble walking spasms  Prior Studies Any changes since last visit?  no  Physicians involved in  your care Any changes since last visit?  no   Family History  Problem Relation Age of Onset   Hypertension Mother    Heart attack Father    Colon cancer Sister    Esophageal cancer Neg Hx    Rectal cancer Neg Hx    Stomach cancer Neg Hx    Social History   Socioeconomic History   Marital status: Married    Spouse name: [name]   Number of children: 5   Years of education: Not on file   Highest education level: Associate degree: occupational, Scientist, product/process development, or vocational program  Occupational History    Comment: electrician, heat/AC  Tobacco Use   Smoking status: Never   Smokeless tobacco: Never  Vaping Use   Vaping Use: Never used  Substance and Sexual Activity   Alcohol use: Yes    Alcohol/week: 3.0 standard drinks    Types: 3 Cans of beer per week   Drug use: No   Sexual activity: Not on file  Other Topics Concern   Not on file  Social History Narrative   Lives with wife, child   Caffeine- occas   Right handed   One story home   Social Determinants of Health   Financial Resource Strain: Not on file  Food Insecurity: Not on file  Transportation Needs: Not on file  Physical Activity: Not on file  Stress: Not on file  Social Connections: Not on file   Past Surgical History:  Procedure Laterality Date   ANTERIOR CERVICAL DECOMP/DISCECTOMY FUSION N/A 01/16/2019   Procedure: Cervical six corpectomy with Cervical five-seven fusion and plating;  Surgeon: Coletta Memos, MD;  Location: [facility] OR;  Service: Neurosurgery;  Laterality: N/A;   NO PAST SURGERIES     WISDOM TOOTH EXTRACTION     WRIST SURGERY Left 2014   fx   Past Medical History:  Diagnosis Date   Anemia    low iron   Asthma    as a child   Cervical spondylosis with myelopathy    Fatty liver    GERD (gastroesophageal reflux disease)    Heart murmur    when he was younger    Hypertension    Pneumonia    BP (!) 133/91 (BP Location: Left Arm, Patient Position: Sitting, Cuff Size: Large)   Pulse 73    Temp 98.4 F (36.9 C) (Oral)   Ht 5\' 10"  (1.778 m)   Wt 267 lb (121.1 kg)   SpO2 94%   BMI 38.31 kg/m   Opioid Risk Score:   Fall Risk Score:  `1  Depression screen PHQ 2/9  Depression screen [facility] 2/9 12/26/2019 09/12/2019 08/14/2019  Decreased Interest 1 0 0  Down, Depressed, Hopeless 1 0 0  PHQ - 2 Score 2 0 0  Altered sleeping - 0 0  Tired, decreased energy - 0 3  Change in appetite - 0 0  Feeling bad or failure about yourself  - 0 0  Trouble concentrating - 0 0  Moving slowly or fidgety/restless - 0 0  Suicidal thoughts - 0 0  PHQ-9 Score - 0 3  Difficult doing work/chores - - Not difficult at all     Review of Systems  Musculoskeletal:  Positive for back pain.       Bilateral arm and leg pain spasms  Neurological:  Positive for weakness.       Tingling  All other systems reviewed and are negative.     Objective:   Physical Exam Awake, alert, depressed affect; not in wheelchair today!, lost some weight?, NAD MS: UE- deltoids, biceps, triceps, and WE 5/5- grip is 4+/5 and finger abd 4-/5 B/L L 5th digit crooked- chronic LE- HF 4-/5, KE- 4+/5, DF 4/5 and PF 5-/5  Neuro: Don't see any spasticity type muscle spasms No increased tone, but is stiff- c/o of it in back and arms and legs.  No hoffman's B/L (Feels like been lifting weights)  Muscle REALLY tight/palpable trigger points on exam esp in rhomboids, and upper traps- like rocks.   Shuffling gait and arms at side when walks!    Assessment & Plan:   Pt is a 55 yr old with GBS and severe nerve pain and weakness here for follow up for his GBS.   Tennis balls- 2-4 minutes- on tight muscles- lean against hard surface- hold pressure- no massage with this technique-  Can also use rolling pin on thighs and calves- roll towards heart. Can also help muscle tightness.  Can try a massage "pad" that you put on your chair, but tennis ball/roller gets to more spots.  Change methocarbemol - stop using- to  Flexeril/Cyclobenzaprine-  take 10 mg up to 3x/day as needed- but don't take more than 4-5days/week. Don't want your body to be used to the medicine.  Refill tramadol- 50 mg q6 hours as needed- #90- 5 refills Trigger point injections didn't help at all when got them-  F/U in 3 months Call me to discuss how things are going.  Oxcarbemezepine- 600 mg nightly- for nerve pain I spent a total of 30 minutes on visit- discussing tennis balls, rolling pnd how to work on myofascial pain and pain control overall.

## 2020-09-06 NOTE — Patient Instructions (Signed)
Pt is a 55 yr old with GBS and severe nerve pain and weakness here for follow up for his GBS.   Tennis balls- 2-4 minutes- on tight muscles- lean against hard surface- hold pressure- no massage with this technique-  Can also use rolling pin on thighs and calves- roll towards heart. Can also help muscle tightness.  Can try a massage "pad" that you put on your chair, but tennis ball/roller gets to more spots.  Change methocarbemol - stop using- to Flexeril/Cyclobenzaprine-  take 10 mg up to 3x/day as needed- but don't take more than 4-5days/week. Don't want your body to be used to the medicine.  Refill tramadol- 50 mg q6 hours as needed- #90- 5 refills Trigger point injections didn't help at all when got them-  F/U in 3 months Call me to discuss how things are going.  Oxcarbemezepine- 600 mg nightly- for nerve pain

## 2020-12-10 ENCOUNTER — Encounter: Payer: Self-pay | Admitting: Physical Medicine and Rehabilitation

## 2021-02-28 ENCOUNTER — Encounter: Payer: Self-pay | Attending: Physical Medicine and Rehabilitation | Admitting: Physical Medicine and Rehabilitation

## 2021-02-28 ENCOUNTER — Encounter: Payer: Self-pay | Admitting: Physical Medicine and Rehabilitation

## 2021-02-28 ENCOUNTER — Other Ambulatory Visit: Payer: Self-pay

## 2021-02-28 VITALS — BP 147/102 | HR 83 | Temp 98.5°F | Ht 70.0 in | Wt 237.2 lb

## 2021-02-28 DIAGNOSIS — G610 Guillain-Barre syndrome: Secondary | ICD-10-CM

## 2021-02-28 DIAGNOSIS — G894 Chronic pain syndrome: Secondary | ICD-10-CM

## 2021-02-28 MED ORDER — TRAMADOL HCL 50 MG PO TABS: 50.0000 mg | ORAL_TABLET | Freq: Four times a day (QID) | ORAL | 5 refills | Status: DC | PRN

## 2021-02-28 MED ORDER — CYCLOBENZAPRINE HCL 10 MG PO TABS: 10.0000 mg | ORAL_TABLET | Freq: Three times a day (TID) | ORAL | 5 refills | Status: DC | PRN

## 2021-02-28 NOTE — Patient Instructions (Signed)
Pt is a 55 yr old with GBS and severe nerve pain and weakness here for follow up for his GBS.    Will refill Tramadol- 50 mg q6 hours prn- #90- 5 refills 2. Didn't get good results with Low dose Naltrexone. Or Trileptal or Keppra- so not many other nerve pain med options- also has tried Duloxetine and Lyrica without good results.   3. Also will refill Flexeril 10 mg 3x/day as needed # 90 5 refills.    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 Wrote Doctor's note and explained this- and can call me if questions.    5. If it doesn't work; get too tired/fatigued, call me and can back off of work some.    6. F/U in 3 months to see how doing.

## 2021-02-28 NOTE — Progress Notes (Signed)
Subjective:    Patient ID: Santiago Glad., male    DOB: 02-29-1966, 55 y.o.   MRN: [id number]  HPI  Pt is a 55 yr old with GBS and severe nerve pain and weakness here for follow up for his GBS.  And severe nerve pan.    Ran out of meds- tramadol and Flexeril.  Couldn't tell a difference with Trileptal - so stopped it.   Some days are OK; but some days still bad.   Needs to get refills of Tramadol-  Tramadol doesn't bring pain down much- takes the edge off.   Walks 1/4 mile in neighborhood sometimes Drove here today- Can walk 1/4 mile before gets tired.  Almost ready to go back to work!  Social Hx: Things OK at home  Pain Inventory Average Pain 5 Pain Right Now 4 My pain is dull and spasms  LOCATION OF PAIN  lower back  BOWEL Number of stools per week: 4   BLADDER Normal    Mobility walk without assistance ability to climb steps?  yes do you drive?  yes  Function disabled: date disabled 2021  Neuro/Psych weakness numbness tingling spasms  Prior Studies Any changes since last visit?  no  Physicians involved in your care Any changes since last visit?  no   Family History  Problem Relation Age of Onset   Hypertension Mother    Heart attack Father    Colon cancer Sister    Esophageal cancer Neg Hx    Rectal cancer Neg Hx    Stomach cancer Neg Hx    Social History   Socioeconomic History   Marital status: Married    Spouse name: [name]   Number of children: 5   Years of education: Not on file   Highest education level: Associate degree: occupational, Scientist, product/process development, or vocational program  Occupational History    Comment: electrician, heat/AC  Tobacco Use   Smoking status: Never   Smokeless tobacco: Never  Vaping Use   Vaping Use: Never used  Substance and Sexual Activity   Alcohol use: Yes    Alcohol/week: 3.0 standard drinks    Types: 3 Cans of beer per week   Drug use: No   Sexual activity: Not on file  Other Topics Concern   Not  on file  Social History Narrative   Lives with wife, child   Caffeine- occas   Right handed   One story home   Social Determinants of Health   Financial Resource Strain: Not on file  Food Insecurity: Not on file  Transportation Needs: Not on file  Physical Activity: Not on file  Stress: Not on file  Social Connections: Not on file   Past Surgical History:  Procedure Laterality Date   ANTERIOR CERVICAL DECOMP/DISCECTOMY FUSION N/A 01/16/2019   Procedure: Cervical six corpectomy with Cervical five-seven fusion and plating;  Surgeon: Coletta Memos, MD;  Location: [facility] OR;  Service: Neurosurgery;  Laterality: N/A;   NO PAST SURGERIES     WISDOM TOOTH EXTRACTION     WRIST SURGERY Left 2014   fx   Past Medical History:  Diagnosis Date   Anemia    low iron   Asthma    as a child   Cervical spondylosis with myelopathy    Fatty liver    GERD (gastroesophageal reflux disease)    Heart murmur    when he was younger    Hypertension    Pneumonia    Ht 5\' 10"  (1.778 m)  Wt 237 lb 3.2 oz (107.6 kg)   BMI 34.03 kg/m   Opioid Risk Score:   Fall Risk Score:  `1  Depression screen PHQ 2/9  Depression screen [facility] 2/9 02/28/2021 12/26/2019 09/12/2019 08/14/2019  Decreased Interest 0 1 0 0  Down, Depressed, Hopeless 0 1 0 0  PHQ - 2 Score 0 2 0 0  Altered sleeping - - 0 0  Tired, decreased energy - - 0 3  Change in appetite - - 0 0  Feeling bad or failure about yourself  - - 0 0  Trouble concentrating - - 0 0  Moving slowly or fidgety/restless - - 0 0  Suicidal thoughts - - 0 0  PHQ-9 Score - - 0 3  Difficult doing work/chores - - - Not difficult at all     Review of Systems  Constitutional: Negative.   HENT: Negative.    Eyes: Negative.   Respiratory: Negative.    Cardiovascular: Negative.   Gastrointestinal: Negative.   Endocrine: Negative.   Genitourinary: Negative.   Musculoskeletal:  Positive for back pain.  Skin: Negative.   Allergic/Immunologic: Negative.    Neurological:  Positive for weakness and numbness.  Hematological: Negative.   Psychiatric/Behavioral:  Positive for sleep disturbance.       Objective:   Physical Exam  Awake, alert, appropriate, BMI 34- has lost 30 lbs; NAD MS: 5/5 in UE B/L LE's- 4+/5 HF and 5/5 B/L    However almost lurching gait without an assistive device-  Doesn't look completely steady     Assessment & Plan:   Pt is a 55 yr old with GBS and severe nerve pain and weakness here for follow up for his GBS. Abnormal gait. Also nerve pain.    Will refill Tramadol- 50 mg q6 hours prn- #90- 5 refills 2. Didn't get good results with Low dose Naltrexone. Or Trileptal or Keppra- so not many other nerve pain med options- also has tried Duloxetine and Lyrica without good results.   3. Also will refill Flexeril 10 mg 3x/day as needed # 90 5 refills.    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 Wrote Doctor's note and explained this- and can call me if questions.    5. If it doesn't work; get too tired/fatigued, call me and can back off of work some.    6. F/U in 3 months to see how doing.   I spent a total of 22 minutes on visit- going over work -and starting back to work- as well as went over all meds to see if anything else can work- with going back to work, don't want to increase pain meds. Since works doing basically paperwork for job, can likely get back to work, however will keep a close eye on his continued recovery gait is still NOT normal.

## 2021-04-15 ENCOUNTER — Ambulatory Visit (INDEPENDENT_AMBULATORY_CARE_PROVIDER_SITE_OTHER): Payer: 59 | Admitting: Family Medicine

## 2021-04-15 ENCOUNTER — Encounter: Payer: Self-pay | Admitting: Family Medicine

## 2021-04-15 VITALS — BP 130/84 | HR 84 | Temp 98.7°F | Ht 70.0 in | Wt 236.5 lb

## 2021-04-15 DIAGNOSIS — G610 Guillain-Barre syndrome: Secondary | ICD-10-CM

## 2021-04-15 DIAGNOSIS — Z125 Encounter for screening for malignant neoplasm of prostate: Secondary | ICD-10-CM

## 2021-04-15 DIAGNOSIS — Z0000 Encounter for general adult medical examination without abnormal findings: Secondary | ICD-10-CM | POA: Diagnosis not present

## 2021-04-15 DIAGNOSIS — Z1211 Encounter for screening for malignant neoplasm of colon: Secondary | ICD-10-CM | POA: Diagnosis not present

## 2021-04-15 LAB — COMPREHENSIVE METABOLIC PANEL
ALT: 10 U/L (ref 0–53)
AST: 15 U/L (ref 0–37)
Albumin: 4 g/dL (ref 3.5–5.2)
Alkaline Phosphatase: 83 U/L (ref 39–117)
BUN: 7 mg/dL (ref 6–23)
CO2: 28 mEq/L (ref 19–32)
Calcium: 9.3 mg/dL (ref 8.4–10.5)
Chloride: 106 mEq/L (ref 96–112)
Creatinine, Ser: 0.93 mg/dL (ref 0.40–1.50)
GFR: 91.25 mL/min (ref >60.00)
Glucose, Bld: 93 mg/dL (ref 70–99)
Potassium: 4.1 mEq/L (ref 3.5–5.1)
Sodium: 143 mEq/L (ref 135–145)
Total Bilirubin: 0.4 mg/dL (ref 0.2–1.2)
Total Protein: 7.5 g/dL (ref 6.0–8.3)

## 2021-04-15 LAB — LIPID PANEL
Cholesterol: 198 mg/dL (ref 0–200)
HDL: 89.2 mg/dL (ref >39.00)
LDL Cholesterol: 98 mg/dL (ref 0–99)
NonHDL: 108.44
Total CHOL/HDL Ratio: 2
Triglycerides: 53 mg/dL (ref 0.0–149.0)
VLDL: 10.6 mg/dL (ref 0.0–40.0)

## 2021-04-15 LAB — CBC
HCT: 46.8 % (ref 39.0–52.0)
Hemoglobin: 15.2 g/dL (ref 13.0–17.0)
MCHC: 32.4 g/dL (ref 30.0–36.0)
MCV: 96.7 fl (ref 78.0–100.0)
Platelets: 214 10*3/uL (ref 150.0–400.0)
RBC: 4.83 Mil/uL (ref 4.22–5.81)
RDW: 15.1 % (ref 11.5–15.5)
WBC: 3 10*3/uL — ABNORMAL LOW (ref 4.0–10.5)

## 2021-04-15 LAB — PSA: PSA: 1.03 ng/mL (ref 0.10–4.00)

## 2021-04-15 MED ORDER — TIZANIDINE HCL 4 MG PO TABS
4.0000 mg | ORAL_TABLET | Freq: Four times a day (QID) | ORAL | 0 refills | Status: DC | PRN
Start: 2021-04-15 — End: 2021-05-11

## 2021-04-15 NOTE — Patient Instructions (Addendum)
Give us 2-3 business days to get the results of your labs back.   Keep the diet clean and stay active.  If you do not hear anything about your referral in the next 1-2 weeks, call our office and ask for an update.  Let us know if you need anything. 

## 2021-04-15 NOTE — Progress Notes (Signed)
Chief Complaint  Patient presents with   Annual Exam    Well Male Jorge Weaver. is here for a complete physical.   His last physical was >1 year ago.  Current diet: in general, a "healthy" diet.  Current exercise: walking Weight trend:  Fatigue out of ordinary? No. Seat belt? Not always Advanced directive? No  Health maintenance Shingrix- No Colonoscopy- Yes Tetanus- Yes HIV- Yes Hep C- Yes  GBS- Pt w hx of GBS, slowly recovering for past year. Taking Ultram from PM&R in addition to Flexeril. Has failed Robaxin and Skelaxin. Flexeril does not seem that it helps his spasms either. Strength and balance steadily improving over past year.   Past Medical History:  Diagnosis Date   Anemia    low iron   Asthma    as a child   Cervical spondylosis with myelopathy    Fatty liver    GERD (gastroesophageal reflux disease)    Heart murmur    when he was younger    Hypertension    Pneumonia     Past Surgical History:  Procedure Laterality Date   ANTERIOR CERVICAL DECOMP/DISCECTOMY FUSION N/A 01/16/2019   Procedure: Cervical six corpectomy with Cervical five-seven fusion and plating;  Surgeon: Coletta Memos, MD;  Location: [facility] OR;  Service: Neurosurgery;  Laterality: N/A;   NO PAST SURGERIES     WISDOM TOOTH EXTRACTION     WRIST SURGERY Left 2014   fx    Medications  Current Outpatient Medications on File Prior to Visit  Medication Sig Dispense Refill   traMADol (ULTRAM) 50 MG tablet Take 1 tablet (50 mg total) by mouth every 6 (six) hours as needed. 90 tablet 5    Allergies No Known Allergies  Family History Family History  Problem Relation Age of Onset   Hypertension Mother    Heart attack Father    Colon cancer Sister    Esophageal cancer Neg Hx    Rectal cancer Neg Hx    Stomach cancer Neg Hx     Review of Systems: Constitutional:  no fevers Eye:  no recent significant change in vision Ear/Nose/Mouth/Throat:  Ears:  no hearing loss Nose/Mouth/Throat:  no  complaints of nasal congestion, no sore throat Cardiovascular:  no chest pain Respiratory:  no shortness of breath Gastrointestinal:  no change in bowel habits GU:  Male: negative for dysuria, frequency Musculoskeletal/Extremities:  no joint pain Integumentary (Skin/Breast):  no abnormal skin lesions reported Neurologic:  +spasms Endocrine: No unexpected weight changes Hematologic/Lymphatic:  no abnormal bleeding  Exam BP 130/84 (BP Location: Left Arm, Cuff Size: Normal)    Pulse 84    Temp 98.7 F (37.1 C) (Oral)    Ht 5\' 10"  (1.778 m)    Wt 236 lb 8 oz (107.3 kg)    SpO2 96%    BMI 33.93 kg/m  General:  well developed, well nourished, in no apparent distress Skin:  no significant moles, warts, or growths Head:  no masses, lesions, or tenderness Eyes:  pupils equal and round, sclera anicteric without injection Ears:  canals without lesions, TMs shiny without retraction, no obvious effusion, no erythema Nose:  nares patent, septum midline, mucosa normal Throat/Pharynx:  lips and gingiva without lesion; tongue and uvula midline; non-inflamed pharynx; no exudates or postnasal drainage Neck: neck supple without adenopathy, thyromegaly, or masses Cardiac: RRR, no bruits, no LE edema Lungs:  clear to auscultation, breath sounds equal bilaterally, no respiratory distress Abdomen: BS+, soft, non-tender, non-distended, no masses or organomegaly  noted Rectal: Deferred Musculoskeletal:  symmetrical muscle groups noted without atrophy or deformity Neuro:  gait normal; deep tendon reflexes normal and symmetric Psych: well oriented with normal range of affect and appropriate judgment/insight  Assessment and Plan  Well adult exam - Plan: CBC, Comprehensive metabolic panel, Lipid panel  Screening PSA (prostate specific antigen) - Plan: PSA  GBS (Guillain-Barre syndrome) (HCC), Chronic - Plan: tiZANidine (ZANAFLEX) 4 MG tablet  Screen for colon cancer - Plan: Ambulatory referral to  Gastroenterology   Well 56 y.o. male. Counseled on diet and exercise. Counseled on risks and benefits of prostate cancer screening with PSA. The patient agrees to undergo testing. Politely declined all vaccinations. GBS- trial Zanaflex, stop Flexeril. Appreciate PM&R. Letter for work given stating he has no other contraindication to working outside of what PM&R is managing.  Immunizations, labs, and further orders as above. Follow up in 1 yr or prn. The patient voiced understanding and agreement to the plan.  [name], DO 04/15/21 10:59 AM

## 2021-05-11 ENCOUNTER — Telehealth: Payer: Self-pay | Admitting: Family Medicine

## 2021-05-11 DIAGNOSIS — G610 Guillain-Barre syndrome: Secondary | ICD-10-CM

## 2021-05-11 MED ORDER — TIZANIDINE HCL 4 MG PO TABS: 4.0000 mg | ORAL_TABLET | Freq: Four times a day (QID) | ORAL | 2 refills | Status: DC | PRN

## 2021-05-11 NOTE — Telephone Encounter (Signed)
Medication:  tiZANidine (ZANAFLEX) 4 MG tablet [[id number]]     Has the patient contacted their pharmacy? No. (If no, request that the patient contact the pharmacy for the refill.) (If yes, when and what did the pharmacy advise?)     Preferred Pharmacy (with phone number or street name):  [id number] [place] Sp[place], [place] - [address] AT [facility] [address]  [address], [place] [address]  Phone:  [phone]  Fax:  [phone]     Agent: Please be advised that RX refills may take up to 3 business days. We ask that you follow-up with your pharmacy.

## 2021-05-11 NOTE — Telephone Encounter (Signed)
Rx sent 

## 2021-06-01 ENCOUNTER — Encounter: Payer: 59 | Attending: Physical Medicine and Rehabilitation | Admitting: Physical Medicine and Rehabilitation

## 2021-06-01 DIAGNOSIS — G894 Chronic pain syndrome: Secondary | ICD-10-CM | POA: Insufficient documentation

## 2021-06-01 DIAGNOSIS — G610 Guillain-Barre syndrome: Secondary | ICD-10-CM | POA: Insufficient documentation

## 2021-06-23 ENCOUNTER — Encounter (HOSPITAL_BASED_OUTPATIENT_CLINIC_OR_DEPARTMENT_OTHER): Payer: Self-pay | Admitting: Emergency Medicine

## 2021-06-23 ENCOUNTER — Emergency Department (HOSPITAL_BASED_OUTPATIENT_CLINIC_OR_DEPARTMENT_OTHER): Payer: 59

## 2021-06-23 ENCOUNTER — Emergency Department (HOSPITAL_BASED_OUTPATIENT_CLINIC_OR_DEPARTMENT_OTHER)
Admission: EM | Admit: 2021-06-23 | Discharge: 2021-06-23 | Disposition: A | Discharge status: Home / Self Care | Payer: 59 | Attending: Emergency Medicine | Admitting: Emergency Medicine

## 2021-06-23 DIAGNOSIS — I10 Essential (primary) hypertension: Secondary | ICD-10-CM | POA: Insufficient documentation

## 2021-06-23 DIAGNOSIS — Z8669 Personal history of other diseases of the nervous system and sense organs: Secondary | ICD-10-CM

## 2021-06-23 DIAGNOSIS — M545 Low back pain, unspecified: Secondary | ICD-10-CM | POA: Insufficient documentation

## 2021-06-23 DIAGNOSIS — J45909 Unspecified asthma, uncomplicated: Secondary | ICD-10-CM | POA: Insufficient documentation

## 2021-06-23 DIAGNOSIS — W1830XA Fall on same level, unspecified, initial encounter: Secondary | ICD-10-CM | POA: Diagnosis not present

## 2021-06-23 DIAGNOSIS — W19XXXA Unspecified fall, initial encounter: Secondary | ICD-10-CM

## 2021-06-23 DIAGNOSIS — R531 Weakness: Secondary | ICD-10-CM | POA: Diagnosis not present

## 2021-06-23 DIAGNOSIS — S300XXA Contusion of lower back and pelvis, initial encounter: Secondary | ICD-10-CM

## 2021-06-23 HISTORY — DX: Guillain-Barre syndrome: G61.0

## 2021-06-23 LAB — BASIC METABOLIC PANEL
Anion gap: 12 (ref 5–15)
BUN: 8 mg/dL (ref 6–20)
CO2: 25 mmol/L (ref 22–32)
Calcium: 8.6 mg/dL — ABNORMAL LOW (ref 8.9–10.3)
Chloride: 105 mmol/L (ref 98–111)
Creatinine, Ser: 0.91 mg/dL (ref 0.61–1.24)
GFR, Estimated: >60 mL/min (ref >60)
Glucose, Bld: 90 mg/dL (ref 70–99)
Potassium: 4 mmol/L (ref 3.5–5.1)
Sodium: 142 mmol/L (ref 135–145)

## 2021-06-23 LAB — CBC WITH DIFFERENTIAL/PLATELET
Abs Immature Granulocytes: 0.01 10*3/uL (ref 0.00–0.07)
Basophils Absolute: 0 10*3/uL (ref 0.0–0.1)
Basophils Relative: 0 %
Eosinophils Absolute: 0 10*3/uL (ref 0.0–0.5)
Eosinophils Relative: 0 %
HCT: 46.5 % (ref 39.0–52.0)
Hemoglobin: 15.7 g/dL (ref 13.0–17.0)
Immature Granulocytes: 0 %
Lymphocytes Relative: 31 %
Lymphs Abs: 1.5 10*3/uL (ref 0.7–4.0)
MCH: 32.1 pg (ref 26.0–34.0)
MCHC: 33.8 g/dL (ref 30.0–36.0)
MCV: 95.1 fL (ref 80.0–100.0)
Monocytes Absolute: 0.4 10*3/uL (ref 0.1–1.0)
Monocytes Relative: 9 %
Neutro Abs: 3 10*3/uL (ref 1.7–7.7)
Neutrophils Relative %: 60 %
Platelets: 225 10*3/uL (ref 150–400)
RBC: 4.89 MIL/uL (ref 4.22–5.81)
RDW: 14.3 % (ref 11.5–15.5)
WBC: 4.9 10*3/uL (ref 4.0–10.5)
nRBC: 0 % (ref 0.0–0.2)

## 2021-06-23 LAB — MAGNESIUM: Magnesium: 2.2 mg/dL (ref 1.7–2.4)

## 2021-06-23 MED ORDER — MORPHINE SULFATE (PF) 4 MG/ML IV SOLN
4.0000 mg | Freq: Once | INTRAVENOUS | Status: AC
  Administered 2021-06-23: 4 mg via INTRAVENOUS
  Filled 2021-06-23: qty 1

## 2021-06-23 NOTE — ED Provider Notes (Signed)
?[facility] EMERGENCY DEPARTMENT ?Provider Note ? ? ?CSN: [id number] ?Arrival date & time: 06/23/21  0247 ? ?  ? ?History ? ?No chief complaint on file. ? ? ?Jorge Weaver. is a 56 y.o. male. ? ?Patient is a 56 year old male presenting with complaints of fall.  He has a history of Guillain-Barr? with neuropathic pain, and weakness which makes him prone to falls.  He also has history of asthma, hypertension, anxiety, and EtOH abuse.  He was apparently getting out of the car this evening when he fell backward and injured his back.  He denies any radiation into his legs.  He denies any bowel or bladder complaints.  He denies having struck his head, neck pain, or loss of consciousness.  He denies other complaints. ? ?The history is provided by the patient.  ? ?  ? ?Home Medications ?Prior to Admission medications   ?Medication Sig Start Date End Date Taking? Authorizing Provider  ?tiZANidine (ZANAFLEX) 4 MG tablet Take 1 tablet (4 mg total) by mouth every 6 (six) hours as needed for muscle spasms. 05/11/21   [name], DO  ?traMADol (ULTRAM) 50 MG tablet Take 1 tablet (50 mg total) by mouth every 6 (six) hours as needed. 02/28/21   Courtney Heys, MD  ?   ? ?Allergies    ?Patient has no known allergies.   ? ?Review of Systems   ?Review of Systems  ?All other systems reviewed and are negative. ? ?Physical Exam ?Updated Vital Signs ?SpO2 95%  ?Physical Exam ?Vitals and nursing note reviewed.  ?Constitutional:   ?   General: He is not in acute distress. ?   Appearance: He is well-developed. He is not diaphoretic.  ?HENT:  ?   Head: Normocephalic and atraumatic.  ?Neck:  ?   Comments: There is no cervical spine tenderness.  He has painless range of motion in all directions. ?Cardiovascular:  ?   Rate and Rhythm: Normal rate and regular rhythm.  ?   Heart sounds: No murmur heard. ?  No friction rub.  ?Pulmonary:  ?   Effort: Pulmonary effort is normal. No respiratory distress.  ?   Breath sounds: Normal  breath sounds. No wheezing or rales.  ?Abdominal:  ?   General: Bowel sounds are normal. There is no distension.  ?   Palpations: Abdomen is soft.  ?   Tenderness: There is no abdominal tenderness.  ?Musculoskeletal:     ?   General: Normal range of motion.  ?   Cervical back: Normal range of motion and neck supple.  ?   Comments: There is tenderness to palpation in the soft tissues of the lumbar region.  There is no bony tenderness or step-off.  The thoracic spine is nontender.  ?Skin: ?   General: Skin is warm and dry.  ?Neurological:  ?   Mental Status: He is alert and oriented to person, place, and time.  ?   Coordination: Coordination normal.  ? ? ?ED Results / Procedures / Treatments   ?Labs ?(all labs ordered are listed, but only abnormal results are displayed) ?Labs Reviewed  ?BASIC METABOLIC PANEL  ?CBC WITH DIFFERENTIAL/PLATELET  ?ETHANOL  ? ? ?EKG ?None ? ?Radiology ?No results found. ? ?Procedures ?Procedures  ? ? ?Medications Ordered in ED ?Medications  ?morphine (PF) 4 MG/ML injection 4 mg (has no administration in time range)  ? ? ?ED Course/ Medical Decision Making/ A&P ? ?Patient is a 56 year old male with history of Guillain-Barr? and other  medical history as per HPI.  Patient presenting after a fall.  He was apparently getting out of car when he became weak, fell, and landed on his back.  He is complaining of back pain and generalized weakness.  He also reports spasming in his hands. ? ?Patient arrives here with stable vital signs and appears neurologically intact of the lower extremities.  He does have some tenderness in the lower back, however CT scan fails to reveal fracture or other acute injury. ? ?Laboratory studies obtained to evaluate for the cause of his spasms and generalized weakness.  There are no significant electrolyte abnormalities to explain this.  In the past he has been hypokalemic, but potassium today is 4.0. ? ?At this point, I see no indication for admission.  Patient feels as  though he can safely be discharged.  Patient's sister is present and will take him home. ? ?Final Clinical Impression(s) / ED Diagnoses ?Final diagnoses:  ?None  ? ? ?Rx / DC Orders ?ED Discharge Orders   ? ? None  ? ?  ? ? ?  ?Veryl Speak, MD ?06/29/21 2300 ? ?

## 2021-06-23 NOTE — Discharge Instructions (Signed)
Continue medications as previously prescribed. ? ?Follow-up with your neurologist if symptoms are not improving in the next few days. ?

## 2021-06-23 NOTE — ED Notes (Signed)
Placed on oxygen to maintain oxygen saturation while sleeping, pt drowsy after taking morphine.  ? ?Pt sister called and made staff aware she will be able to pick patient up at 7:30 am and she will also bring clothes for patient. Charge nurse made aware.  ?

## 2021-06-23 NOTE — ED Notes (Signed)
Resting quietly with eyes closed, paper scrubs provided to client. Cont to await for his caregiver to arrive to take pt home. Client voices no complaints at this time ?

## 2021-06-23 NOTE — ED Triage Notes (Signed)
Per EMS pt fell getting out of car. Pt has Guillain-Barre with weekness. Unable to get up.  ?

## 2021-08-06 IMAGING — MR MR LUMBAR SPINE WO/W CM
6 of 8 series · 23 of 48 positions shown · IV contrast (gadavist)
Comparison: [HOSPITAL] [HOSPITAL] Lumbar MRIs 04/19/2019,
12/07/2018

CLINICAL DATA: 53-year-old male with increasing neurologic
deficits, involuntary twitches and spasms. Status post cervical
spine ACDF on 01/16/2019.

EXAM:
MRI LUMBAR SPINE WITHOUT AND WITH CONTRAST
TECHNIQUE: Multiplanar and multiecho pulse sequences of the lumbar spine were
obtained without and with intravenous contrast.
CONTRAST:  10mL GADAVIST GADOBUTROL 1 MMOL/ML IV SOLN in conjunction
with contrast enhanced imaging of the head and cervical spine
reported separately.

[Series 5: T2 · sagittal · 4.0mm · 0.73mm/px · 3 of 16 slices shown (1 of 2)]
[im 1/16]
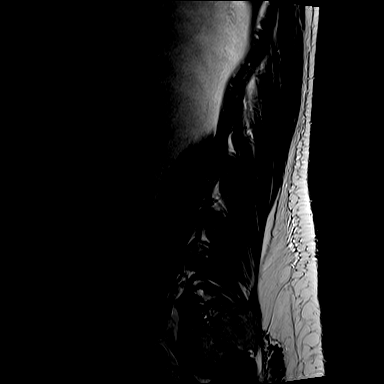
[im 8/16]
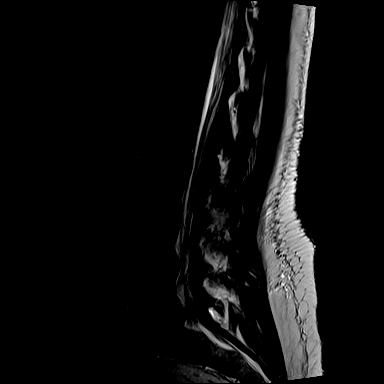
[im 16/16]
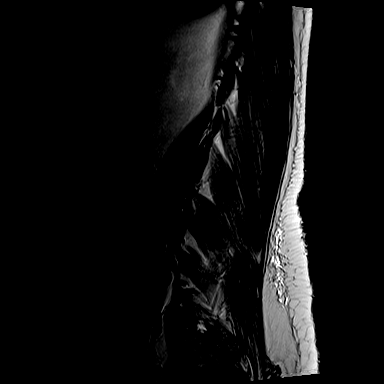

[Series 6: STIR · sagittal · 4.0mm · 0.55mm/px · 2 of 16 slices shown]
[im 1/16]
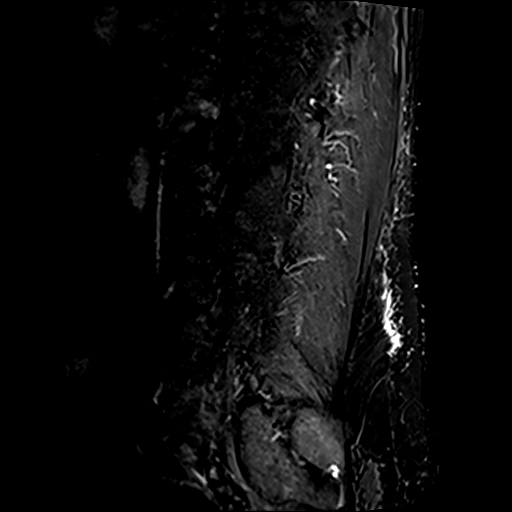
[im 8/16]
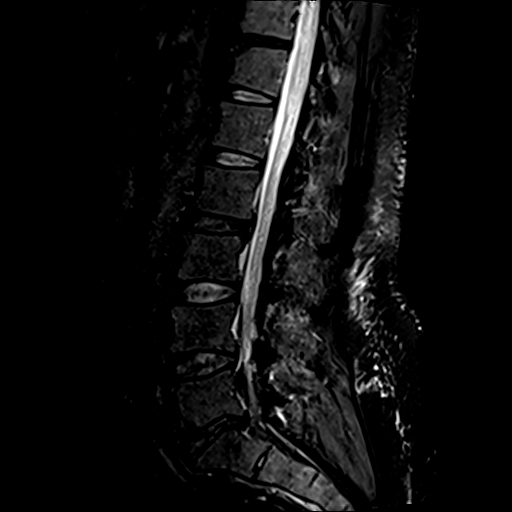

[Series 7: T1 · sagittal · 4.0mm · 0.88mm/px · 3 of 16 slices shown (1 of 2)]
[im 1/16]
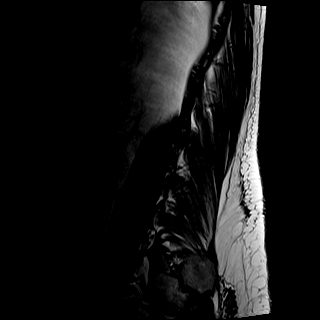
[im 8/16]
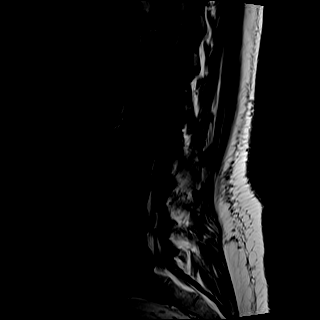
[im 16/16]
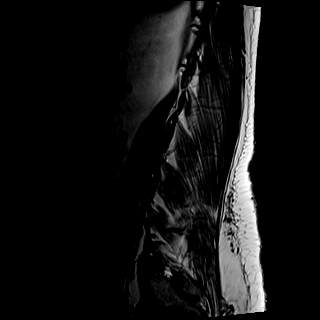

[Series 8: T2 · axial · 4.0mm · 0.57mm/px · z∈[-689,-458]mm · 6 of 39 slices shown (2 of 2)]
[im 1/39]
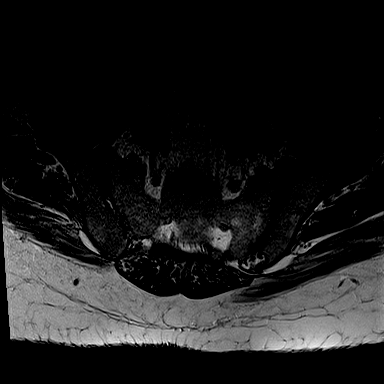
[im 8/39]
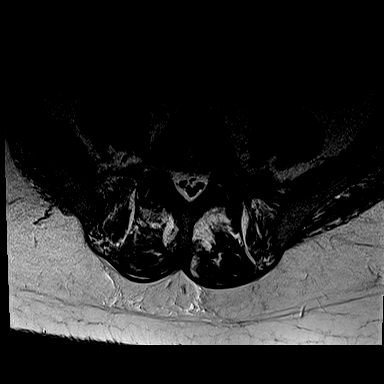
[im 16/39]
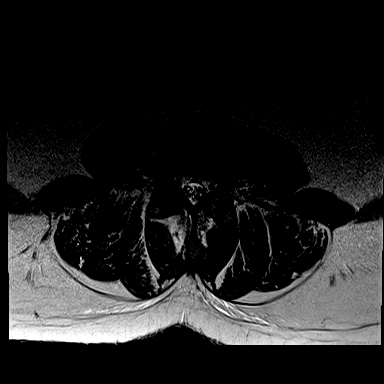
[im 23/39]
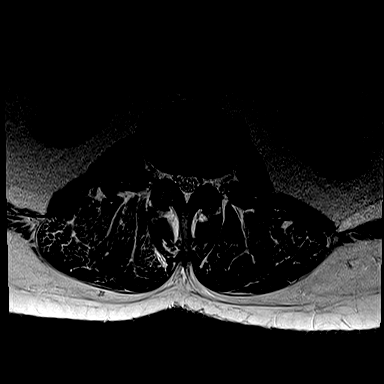
[im 31/39]
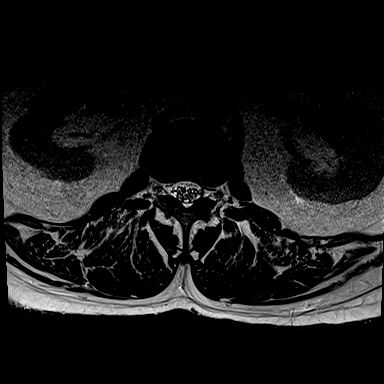
[im 39/39]
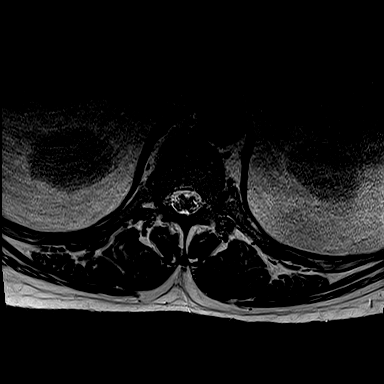

[Series 9: T1 · axial · 4.0mm · 0.34mm/px · z∈[-689,-458]mm · 6 of 39 slices shown (2 of 2)]
[im 1/39]
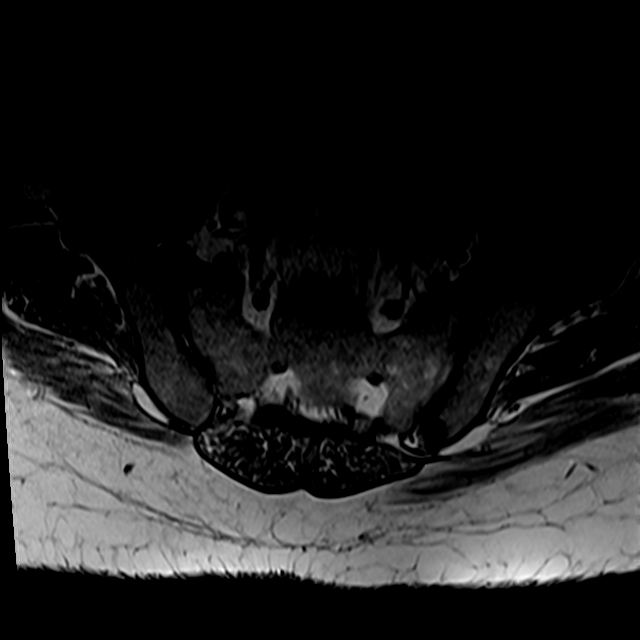
[im 8/39]
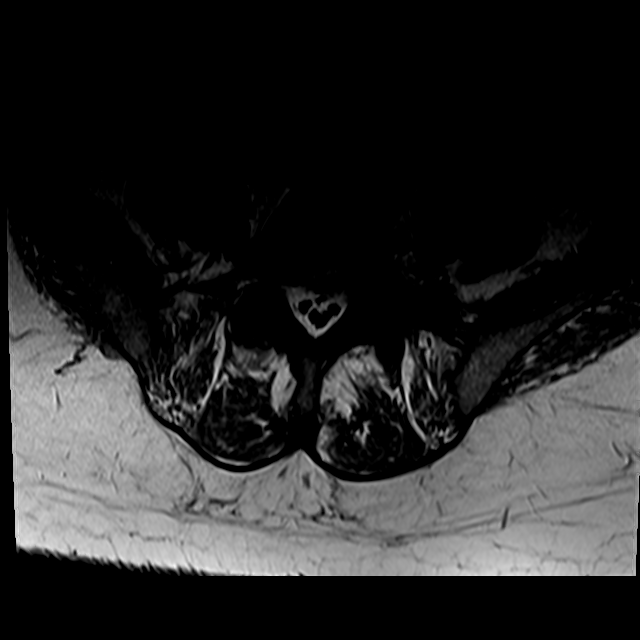
[im 16/39]
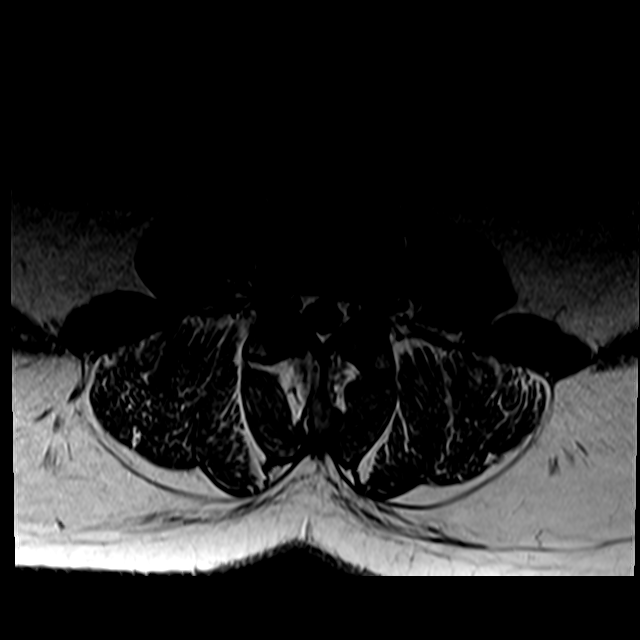
[im 23/39]
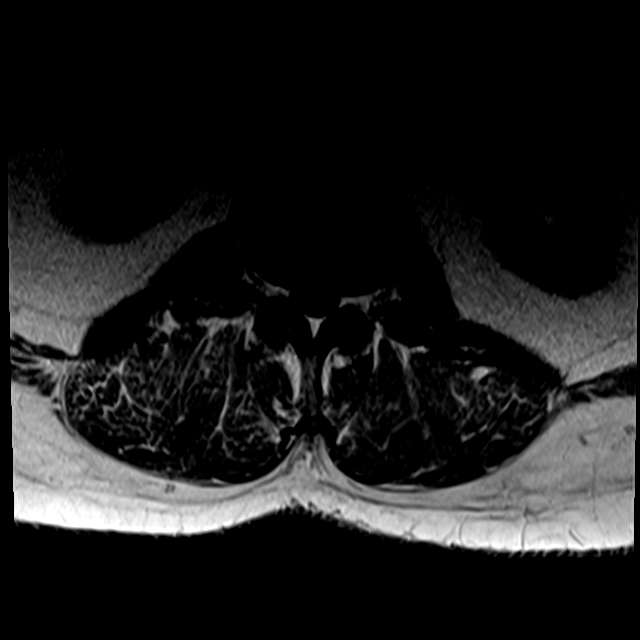
[im 31/39]
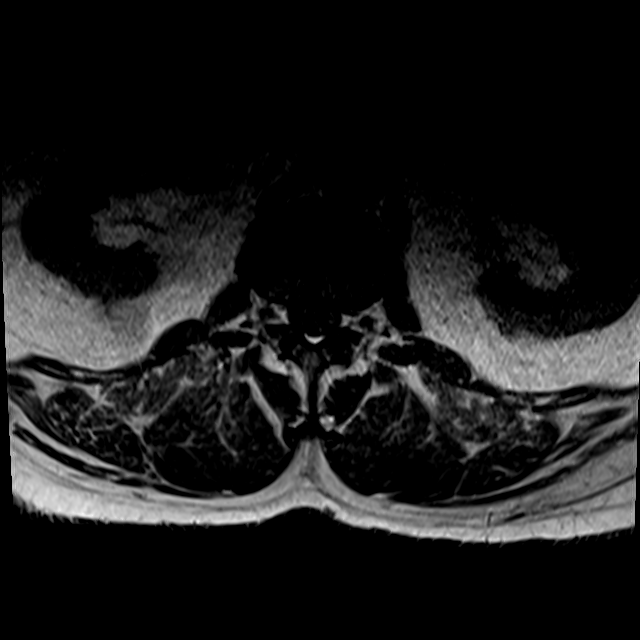
[im 39/39]
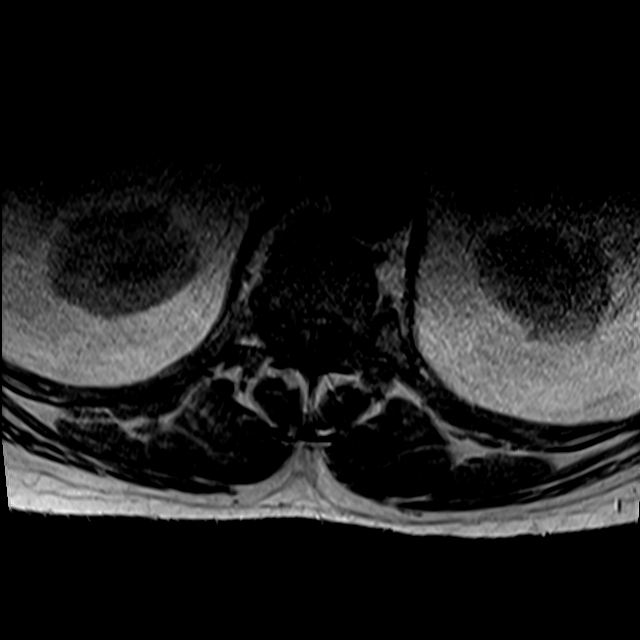

[Series 10: T1 fat-sat post-contrast · sagittal · 4.0mm · 0.88mm/px · 3 of 16 slices shown]
[im 1/16]
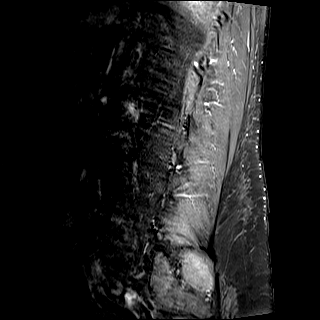
[im 8/16]
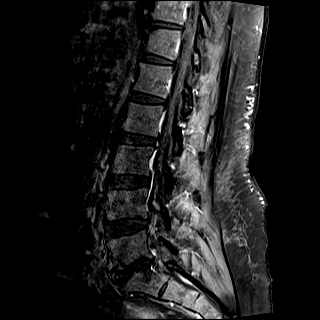
[im 16/16]
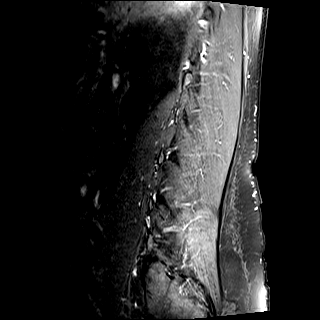

[23 of 48 positions shown; findings below may reference images not displayed]

FINDINGS: Segmentation:  Normal.

Alignment: Stable degenerative appearing retrolisthesis at L5-S1,
straightening of lumbar lordosis elsewhere. There is also mild
chronic retrolisthesis of L2 on L3.

Vertebrae: No marrow edema or evidence of acute osseous abnormality.
Visualized bone marrow signal is within normal limits. Intact
visible sacrum and SI joints.

Conus medullaris and cauda equina: Conus extends to the T12-L1
level. No lower spinal cord or conus signal abnormality. No abnormal
intradural enhancement. No dural thickening.

Paraspinal and other soft tissues: Stable and negative; benign
appearing right renal midpole cyst.

Disc levels:

Since 04/19/2019 lower thoracic and lumbar thecal sac patency
appears mildly improved above L4-L5 due to regression of epidural
lipomatosis.

At L2-L3 circumferential disc bulge with small posterior annular
fissure and mild posterior element hypertrophy persist. Mild
residual spinal stenosis at that level, improved from [DATE]-L5: Persistent mild to moderate spinal stenosis in part due to
epidural lipomatosis. Circumferential disc bulge, progressed right
foraminal annular fissure since [DATE] image 28).
Moderate facet hypertrophy with degenerative facet joint fluid. No
convincing lateral recess stenosis. Moderate right L4 foraminal
stenosis.

L5-S1: Continued epidural lipomatosis. Chronic retrolisthesis and
disc space loss with circumferential disc osteophyte complex and
moderate posterior element hypertrophy. Stable spinal and mild
lateral recess stenosis. Stable moderate to severe left and moderate
right L5 foraminal stenosis.
IMPRESSION: 1. Lumbar thecal sac patency has improved since the [facility] MRI at
all levels above L4-L5, due to regressed epidural lipomatosis.
2. Chronic disc degeneration at L4-L5 has progressed in the form of
new right foraminal annular fissure. Moderate right L4 foraminal
stenosis. Mild to moderate multifactorial spinal stenosis is stable.
3. Advanced chronic L5-S1 degeneration is stable with mild spinal
and lateral recess stenosis, and moderate to severe L5 foraminal
stenosis greater on the left.

## 2021-10-02 ENCOUNTER — Other Ambulatory Visit: Payer: Self-pay

## 2021-10-02 ENCOUNTER — Emergency Department (HOSPITAL_BASED_OUTPATIENT_CLINIC_OR_DEPARTMENT_OTHER)
Admission: EM | Admit: 2021-10-02 | Discharge: 2021-10-03 | Disposition: A | Discharge status: Home / Self Care | Payer: 59 | Attending: Emergency Medicine | Admitting: Emergency Medicine

## 2021-10-02 ENCOUNTER — Emergency Department (HOSPITAL_BASED_OUTPATIENT_CLINIC_OR_DEPARTMENT_OTHER): Payer: 59

## 2021-10-02 ENCOUNTER — Encounter (HOSPITAL_BASED_OUTPATIENT_CLINIC_OR_DEPARTMENT_OTHER): Payer: Self-pay | Admitting: Emergency Medicine

## 2021-10-02 DIAGNOSIS — I10 Essential (primary) hypertension: Secondary | ICD-10-CM | POA: Insufficient documentation

## 2021-10-02 DIAGNOSIS — R072 Precordial pain: Secondary | ICD-10-CM | POA: Diagnosis present

## 2021-10-02 DIAGNOSIS — R0789 Other chest pain: Secondary | ICD-10-CM

## 2021-10-02 DIAGNOSIS — J45909 Unspecified asthma, uncomplicated: Secondary | ICD-10-CM | POA: Insufficient documentation

## 2021-10-02 LAB — BASIC METABOLIC PANEL
Anion gap: 15 (ref 5–15)
BUN: 8 mg/dL (ref 6–20)
CO2: 20 mmol/L — ABNORMAL LOW (ref 22–32)
Calcium: 9 mg/dL (ref 8.9–10.3)
Chloride: 101 mmol/L (ref 98–111)
Creatinine, Ser: 1.09 mg/dL (ref 0.61–1.24)
GFR, Estimated: >60 mL/min (ref >60)
Glucose, Bld: 93 mg/dL (ref 70–99)
Potassium: 3.4 mmol/L — ABNORMAL LOW (ref 3.5–5.1)
Sodium: 136 mmol/L (ref 135–145)

## 2021-10-02 LAB — CBC
HCT: 41.6 % (ref 39.0–52.0)
Hemoglobin: 14.2 g/dL (ref 13.0–17.0)
MCH: 33 pg (ref 26.0–34.0)
MCHC: 34.1 g/dL (ref 30.0–36.0)
MCV: 96.7 fL (ref 80.0–100.0)
Platelets: 152 10*3/uL (ref 150–400)
RBC: 4.3 MIL/uL (ref 4.22–5.81)
RDW: 13.3 % (ref 11.5–15.5)
WBC: 4.8 10*3/uL (ref 4.0–10.5)
nRBC: 0 % (ref 0.0–0.2)

## 2021-10-02 LAB — TROPONIN I (HIGH SENSITIVITY): Troponin I (High Sensitivity): 5 ng/L (ref <18)

## 2021-10-02 NOTE — ED Triage Notes (Signed)
Pt POV c/o chest pain x2 days that radiates to BL arms. Reports numbness in BLLE x2 days.  C/o ShOB "a little bit."  Diaphoresis x2 days.

## 2021-10-02 NOTE — ED Provider Notes (Signed)
[facility] EMERGENCY DEPARTMENT Provider Note   CSN: [id number] Arrival date & time: 10/02/21  2135     History  Chief Complaint  Patient presents with   Chest Pain    Jorge Weaver. is a 56 y.o. male.  Patient is a 56 year old male with past medical history of hypertension, GERD, asthma, Guillain-Barr, chronic pain.  Patient presenting today with complaints of chest discomfort.  He describes a 4-day history of tightness across the front of his chest with no associated shortness of breath, nausea, diaphoresis, or radiation to the arm or jaw.  He denies any exertional symptoms.  His discomfort is constant and not exacerbated with deep breathing or movement.  He denies any fevers, chills, or cough.  He reports no prior cardiac history, but does report having stress test in the past, however this has been sometime ago.  The history is provided by the patient.  Chest Pain Pain location:  Substernal area Pain quality: tightness   Pain radiates to:  Does not radiate Pain severity:  Moderate Onset quality:  Gradual Duration:  4 days Timing:  Constant Progression:  Worsening Chronicity:  New Relieved by:  Nothing Worsened by:  Nothing      Home Medications Prior to Admission medications   Medication Sig Start Date End Date Taking? Authorizing Provider  tiZANidine (ZANAFLEX) 4 MG tablet Take 1 tablet (4 mg total) by mouth every 6 (six) hours as needed for muscle spasms. 05/11/21   [name], DO  traMADol (ULTRAM) 50 MG tablet Take 1 tablet (50 mg total) by mouth every 6 (six) hours as needed. 02/28/21   Genice Rouge, MD      Allergies    Patient has no known allergies.    Review of Systems   Review of Systems  Cardiovascular:  Positive for chest pain.  All other systems reviewed and are negative.   Physical Exam Updated Vital Signs BP (!) 161/103   Pulse 66   Temp 97.9 F (36.6 C) (Tympanic)   Resp 18   Ht 5\' 10"  (1.778 m)   Wt 106.6 kg    SpO2 100%   BMI 33.72 kg/m  Physical Exam Vitals and nursing note reviewed.  Constitutional:      General: He is not in acute distress.    Appearance: He is well-developed. He is not diaphoretic.  HENT:     Head: Normocephalic and atraumatic.  Cardiovascular:     Rate and Rhythm: Normal rate and regular rhythm.     Heart sounds: No murmur heard.    No friction rub.  Pulmonary:     Effort: Pulmonary effort is normal. No respiratory distress.     Breath sounds: Normal breath sounds. No wheezing or rales.  Abdominal:     General: Bowel sounds are normal. There is no distension.     Palpations: Abdomen is soft.     Tenderness: There is no abdominal tenderness.  Musculoskeletal:        General: Normal range of motion.     Cervical back: Normal range of motion and neck supple.     Right lower leg: No tenderness. No edema.     Left lower leg: No tenderness. No edema.  Skin:    General: Skin is warm and dry.  Neurological:     Mental Status: He is alert and oriented to person, place, and time.     Coordination: Coordination normal.     ED Results / Procedures / Treatments  Labs (all labs ordered are listed, but only abnormal results are displayed) Labs Reviewed  BASIC METABOLIC PANEL - Abnormal; Notable for the following components:      Result Value   Potassium 3.4 (*)    CO2 20 (*)    All other components within normal limits  CBC  TROPONIN I (HIGH SENSITIVITY)  TROPONIN I (HIGH SENSITIVITY)    EKG EKG Interpretation  Date/Time:  Sunday October 02 2021 21:54:48 EDT Ventricular Rate:  78 PR Interval:  148 QRS Duration: 84 QT Interval:  394 QTC Calculation: 449 R Axis:   69 Text Interpretation: Normal sinus rhythm Normal ECG When compared with ECG of 27-Jun-2019 17:33, no significant change is noted Confirmed by Geoffery Lyons ([id number]) on 10/02/2021 11:08:09 PM  Radiology DG Chest 2 View  Result Date: 10/02/2021 CLINICAL DATA:  Chest pain for 2 days radiating to  bilateral upper extremities EXAM: CHEST - 2 VIEW COMPARISON:  07/25/2019 FINDINGS: Frontal and lateral views of the chest demonstrate a stable cardiac silhouette. No acute airspace disease, effusion, or pneumothorax. There are no acute bony abnormalities. IMPRESSION: 1. Stable chest, no acute process. Electronically Signed   By: Sharlet Salina M.D.   On: 10/02/2021 22:26    Procedures Procedures  {Document cardiac monitor, telemetry assessment procedure when appropriate:1}  Medications Ordered in ED Medications - No data to display  ED Course/ Medical Decision Making/ A&P                           Medical Decision Making Amount and/or Complexity of Data Reviewed Labs: ordered. Radiology: ordered.   ***  {Document critical care time when appropriate:1} {Document review of labs and clinical decision tools ie heart score, Chads2Vasc2 etc:1}  {Document your independent review of radiology images, and any outside records:1} {Document your discussion with family members, caretakers, and with consultants:1} {Document social determinants of health affecting pt's care:1} {Document your decision making why or why not admission, treatments were needed:1} Final Clinical Impression(s) / ED Diagnoses Final diagnoses:  None    Rx / DC Orders ED Discharge Orders     None

## 2021-10-03 LAB — TROPONIN I (HIGH SENSITIVITY): Troponin I (High Sensitivity): 8 ng/L (ref <18)

## 2021-10-03 MED ORDER — METHOCARBAMOL 500 MG PO TABS: 500.0000 mg | ORAL_TABLET | Freq: Three times a day (TID) | ORAL | 0 refills | Status: DC | PRN

## 2021-10-03 NOTE — Discharge Instructions (Addendum)
Take ibuprofen 600 mg every 6 hours as needed for pain.  Follow-up with cardiology if symptoms persist.  The contact information for the [facility] cardiology clinic has been provided in this discharge summary for you to call and make these arrangements.

## 2022-12-22 ENCOUNTER — Other Ambulatory Visit: Payer: Self-pay | Admitting: Family Medicine

## 2022-12-22 DIAGNOSIS — Z1211 Encounter for screening for malignant neoplasm of colon: Secondary | ICD-10-CM

## 2022-12-22 DIAGNOSIS — Z1212 Encounter for screening for malignant neoplasm of rectum: Secondary | ICD-10-CM

## 2023-01-02 ENCOUNTER — Encounter: Payer: Self-pay | Admitting: Family Medicine

## 2023-01-02 ENCOUNTER — Ambulatory Visit (INDEPENDENT_AMBULATORY_CARE_PROVIDER_SITE_OTHER): Payer: Medicare Other | Admitting: Family Medicine

## 2023-01-02 VITALS — BP 144/88 | HR 92 | Temp 98.0°F | Resp 16 | Ht 70.0 in | Wt 232.8 lb

## 2023-01-02 DIAGNOSIS — R030 Elevated blood-pressure reading, without diagnosis of hypertension: Secondary | ICD-10-CM | POA: Diagnosis not present

## 2023-01-02 DIAGNOSIS — R252 Cramp and spasm: Secondary | ICD-10-CM | POA: Diagnosis not present

## 2023-01-02 MED ORDER — BACLOFEN 10 MG PO TABS: 10.0000 mg | ORAL_TABLET | Freq: Three times a day (TID) | ORAL | 0 refills | Status: DC

## 2023-01-02 NOTE — Progress Notes (Signed)
Chief Complaint  Patient presents with   Follow-up    Follow up/ discuss cologuard    Subjective: Patient is a 57 y.o. male here for f/u.  Patient has a history of chronic demyelinating polyneuropathy.  Since this diagnosis, he has had spasticity of his forearm musculature.  It will come randomly and last for several moments before spontaneously resolving.  He was prescribed tizanidine in the past which did not help.  He does not stay particularly well-hydrated.  No current pain, swelling, redness, or bruising.  Blood pressure elevated today.  He does not check his blood pressures routinely.  Diet could be better.  He does not exercise routinely.  No chest pain or shortness of breath.  Past Medical History:  Diagnosis Date   Anemia    low iron   Asthma    as a child   Cervical spondylosis with myelopathy    Fatty liver    GERD (gastroesophageal reflux disease)    Guillain Barr syndrome (HCC)    Heart murmur    when he was younger    Hypertension    Pneumonia     Objective: BP (!) 144/88 (BP Location: Left Arm, Cuff Size: Large)   Pulse 92   Temp 98 F (36.7 C) (Oral)   Resp 16   Ht 5\' 10"  (1.778 m)   Wt 232 lb 12.8 oz (105.6 kg)   SpO2 98%   BMI 33.40 kg/m  General: Awake, appears stated age Heart: RRR, no LE edema Lungs: CTAB, no rales, wheezes or rhonchi. No accessory muscle use Psych: Age appropriate judgment and insight, normal affect and mood  Assessment and Plan: Spasticity - Plan: baclofen (LIORESAL) 10 MG tablet  Elevated blood pressure reading  Chronic, uncontrolled.  Start baclofen 10 mg 3 times daily.  Stay hydrated.  Stretch.  Follow-up in 6 weeks to recheck. Recommended considering investing in a home blood pressure monitor.  Counseled on diet and exercise.  Follow-up in 6 weeks and we will see what his blood pressure is. The patient voiced understanding and agreement to the plan.  [name] [place], DO 01/02/23  4:33 PM

## 2023-01-02 NOTE — Patient Instructions (Signed)
Check your blood pressures 2-3 times per week, alternating the time of day you check it. If it is high, considering waiting 1-2 minutes and rechecking. If it gets higher, your anxiety is likely creeping up and we should avoid rechecking.   Keep the diet clean and stay active.  Aim to do some physical exertion for 150 minutes per week. This is typically divided into 5 days per week, 30 minutes per day. The activity should be enough to get your heart rate up. Anything is better than nothing if you have time constraints.  Let us know if you need anything.

## 2023-01-15 ENCOUNTER — Telehealth: Payer: Self-pay | Admitting: Family Medicine

## 2023-01-15 NOTE — Telephone Encounter (Signed)
Pt states he was supposed to get labs done earlier this month but did not have his ins yet. He would like to go ahead and get those done. Please advise once orders are in.

## 2023-01-16 NOTE — Telephone Encounter (Signed)
Per note from 01/03/23 - Pt to follow-up in 6 weeks w/ Dr. [name]

## 2023-01-17 NOTE — Telephone Encounter (Signed)
Pt scheduled  

## 2023-02-13 ENCOUNTER — Encounter: Payer: Self-pay | Admitting: Family Medicine

## 2023-02-13 ENCOUNTER — Ambulatory Visit (INDEPENDENT_AMBULATORY_CARE_PROVIDER_SITE_OTHER): Payer: Medicare Other | Admitting: Family Medicine

## 2023-02-13 VITALS — BP 134/86 | HR 73 | Temp 98.0°F | Resp 16 | Ht 70.0 in | Wt 241.4 lb

## 2023-02-13 DIAGNOSIS — J189 Pneumonia, unspecified organism: Secondary | ICD-10-CM | POA: Diagnosis not present

## 2023-02-13 DIAGNOSIS — R252 Cramp and spasm: Secondary | ICD-10-CM | POA: Diagnosis not present

## 2023-02-13 DIAGNOSIS — Z1211 Encounter for screening for malignant neoplasm of colon: Secondary | ICD-10-CM

## 2023-02-13 MED ORDER — AZITHROMYCIN 250 MG PO TABS: ORAL_TABLET | ORAL | 0 refills | Status: DC

## 2023-02-13 MED ORDER — DANTROLENE SODIUM 25 MG PO CAPS: 25.0000 mg | ORAL_CAPSULE | Freq: Three times a day (TID) | ORAL | 1 refills | Status: DC

## 2023-02-13 NOTE — Patient Instructions (Addendum)
Let me know if there are cost issues.  For the first week, take 1 capsule daily of the dantrolene. Then move up to 3 times daily after that.   Continue to push fluids, practice good hand hygiene, and cover your mouth if you cough.  If you start having fevers, shaking or shortness of breath, seek immediate care.  OK to take Tylenol 1000 mg (2 extra strength tabs) or 975 mg (3 regular strength tabs) every 6 hours as needed.  Let us know if you need anything.

## 2023-02-13 NOTE — Progress Notes (Signed)
Chief Complaint  Patient presents with   Follow-up    Follow up    Subjective Jorge Weaver. is a 57 y.o. male who presents for hypertension follow up. He does not monitor home blood pressures. No Cp or SOB. Diet is OK. He is not routinely exercising. He is not on any medication.   Started on Baclofen 10 mg TID for spasticity following chronic GBS. He reports 20% improvement. Was compliant, no AE's.  He has failed Zanaflex, Skelaxin, Robaxin, Flexeril and now baclofen.  Cough Duration: 3 weeks, not improving Associated symptoms: chest tightness and cough Denies: sinus congestion, sinus pain, rhinorrhea, itchy watery eyes, ear pain, ear drainage, sore throat, wheezing, shortness of breath, myalgia, and fevers Treatment to date: none Sick contacts: No   Past Medical History:  Diagnosis Date   Anemia    low iron   Asthma    as a child   Cervical spondylosis with myelopathy    Fatty liver    GERD (gastroesophageal reflux disease)    Guillain Barr syndrome (HCC)    Heart murmur    when he was younger    Hypertension    Pneumonia     Exam BP 134/86 (BP Location: Left Arm, Patient Position: Sitting, Cuff Size: Normal)   Pulse 73   Temp 98 F (36.7 C) (Oral)   Resp 16   Ht 5\' 10"  (1.778 m)   Wt 241 lb 6.4 oz (109.5 kg)   SpO2 95%   BMI 34.64 kg/m  General:  well developed, well nourished, in no apparent distress Heart: RRR, no bruits, no LE edema Lungs: clear to auscultation, no accessory muscle use HEENT: Ear canals are patent without otorrhea, TMs negative bilaterally, no sinus TTP, nares are patent without rhinorrhea, MMM, no pharyngeal exudate or erythema Neuro: Gait is cautious and slow Psych: well oriented with normal range of affect and appropriate judgment/insight  Spasticity - Plan: dantrolene (DANTRIUM) 25 MG capsule  Screen for colon cancer - Plan: Ambulatory referral to Gastroenterology  Walking pneumonia - Plan: azithromycin (ZITHROMAX) 250 MG  tablet  Chronic, uncontrolled.  Stop baclofen.  Start dantrolene 25 mg daily for 7 days and then start taking it 3 times daily.  Follow-up in 1 month recheck.  If no improvement, will try Valium. Refer to gastroenterology today. Z-Pak, supportive care, send message if no better. Elevated blood pressure has resolved. The patient voiced understanding and agreement to the plan.  [name] [place], DO 02/13/23  11:53 AM

## 2023-03-16 ENCOUNTER — Encounter: Payer: Self-pay | Admitting: Family Medicine

## 2023-03-16 ENCOUNTER — Ambulatory Visit (INDEPENDENT_AMBULATORY_CARE_PROVIDER_SITE_OTHER): Payer: Medicare Other | Admitting: Family Medicine

## 2023-03-16 VITALS — BP 138/86 | HR 78 | Temp 98.0°F | Resp 16 | Ht 70.0 in | Wt 234.2 lb

## 2023-03-16 DIAGNOSIS — R252 Cramp and spasm: Secondary | ICD-10-CM | POA: Insufficient documentation

## 2023-03-16 DIAGNOSIS — R058 Other specified cough: Secondary | ICD-10-CM

## 2023-03-16 MED ORDER — BENZONATATE 200 MG PO CAPS: 200.0000 mg | ORAL_CAPSULE | Freq: Two times a day (BID) | ORAL | 0 refills | Status: DC | PRN

## 2023-03-16 MED ORDER — DANTROLENE SODIUM 50 MG PO CAPS: 50.0000 mg | ORAL_CAPSULE | Freq: Three times a day (TID) | ORAL | 1 refills | Status: DC

## 2023-03-16 NOTE — Patient Instructions (Addendum)
Please contact the GI team at: [phone]  Send me a message or call in 2 weeks updating me on your progress.   Heat (pad or rice pillow in microwave) over affected area, 10-15 minutes twice daily.   Ice/cold pack over area for 10-15 min twice daily.  OK to take Tylenol 1000 mg (2 extra strength tabs) or 975 mg (3 regular strength tabs) every 6 hours as needed.  Let us know if you need anything.

## 2023-03-16 NOTE — Progress Notes (Signed)
Chief Complaint  Patient presents with   Follow-up    Follow up    Subjective: Patient is a 57 y.o. male here for f/u spasticity.  1 mo ago he was started on Dantrolene 25 mg TID. He reports compliance, no AE's. Helped 10-20%.  He has failed Skelaxin, Robaxin, Flexeril, Zanaflex, and baclofen.  He has a history of GBS.  Symptoms really started after that.  Patient was seen a month ago for upper respiratory infection.  He was treated and is steadily improving.  He has some lingering coughing and intermittent wheezing.  No fevers or other lingering symptoms.  Past Medical History:  Diagnosis Date   Anemia    low iron   Asthma    as a child   Cervical spondylosis with myelopathy    Fatty liver    GERD (gastroesophageal reflux disease)    Guillain Barr syndrome (HCC)    Heart murmur    when he was younger    Hypertension    Pneumonia     Objective: BP 138/86   Pulse 78   Temp 98 F (36.7 C) (Oral)   Resp 16   Ht 5\' 10"  (1.778 m)   Wt 234 lb 3.2 oz (106.2 kg)   SpO2 96%   BMI 33.60 kg/m  General: Awake, appears stated age Heart: RRR Lungs: CTAB, no rales, wheezes or rhonchi. No accessory muscle use Neuro: Gait is normal for him-slight limp/mild; 5/5 strength throughout, no cerebellar signs Psych: Age appropriate judgment and insight, normal affect and mood  Assessment and Plan: Spasticity - Plan: Hepatic function panel  Post-viral cough syndrome - Plan: benzonatate (TESSALON) 200 MG capsule  Chronic, unstable.  Check liver function.  Increase dantrolene from 25 mg 3 times daily to 50 mg 3 times daily.  Heat, ice, Tylenol.  Send message in 2 weeks if no improvement and we will consider diazepam.  Follow-up in 1 month. Reassurance.  This should resolve in the next several weeks spontaneously.  Tessalon Perles as needed.  We discussed doing nothing in addition to inhalers. The patient voiced understanding and agreement to the plan.  [name] [place], DO 03/16/23   1:48 PM

## 2023-03-27 ENCOUNTER — Telehealth: Payer: Self-pay | Admitting: Family Medicine

## 2023-03-27 NOTE — Telephone Encounter (Signed)
 Copied from CRM [id number]. Topic: Medicare AWV >> Mar 27, 2023  1:40 PM Nathanel DEL wrote: Reason for CRM: Called LVM 03/27/2023 to schedule AWV. Please schedule Virtual or Telehealth visits ONLY.   Nathanel Paschal; Care Guide Ambulatory Clinical Support [place] [facility] Direct Dial: [phone]

## 2023-04-16 ENCOUNTER — Ambulatory Visit (INDEPENDENT_AMBULATORY_CARE_PROVIDER_SITE_OTHER): Payer: No Typology Code available for payment source | Admitting: Family Medicine

## 2023-04-16 ENCOUNTER — Encounter: Payer: Self-pay | Admitting: Family Medicine

## 2023-04-16 VITALS — BP 138/84 | HR 82 | Temp 98.0°F | Resp 16 | Ht 70.0 in | Wt 234.0 lb

## 2023-04-16 DIAGNOSIS — R252 Cramp and spasm: Secondary | ICD-10-CM | POA: Diagnosis not present

## 2023-04-16 MED ORDER — DANTROLENE SODIUM 100 MG PO CAPS: 100.0000 mg | ORAL_CAPSULE | Freq: Three times a day (TID) | ORAL | 2 refills | Status: AC

## 2023-04-16 NOTE — Progress Notes (Signed)
Chief Complaint  Patient presents with   Follow-up    Subjective: Patient is a 58 y.o. male here for facility.  2 months ago he was started on dantrolene 25 mg 3 times daily.  Helped a little bit.  He had another 25% improvement after increasing the 50 mg 3 times daily.  He reports compliance and no adverse effects.  Has failed Skelaxin, Robaxin, Flexeril, baclofen, and Zanaflex.  Past Medical History:  Diagnosis Date   Anemia    low iron   Asthma    as a child   Cervical spondylosis with myelopathy    Fatty liver    GERD (gastroesophageal reflux disease)    Guillain Barr syndrome (HCC)    Heart murmur    when he was younger    Hypertension    Pneumonia     Objective: BP 138/84   Pulse 82   Temp 98 F (36.7 C) (Oral)   Resp 16   Ht 5\' 10"  (1.778 m)   Wt 234 lb (106.1 kg)   SpO2 98%   BMI 33.58 kg/m  General: Awake, appears stated age Neuro: DTRs equal and symmetric in the lower extremities, no clonus, no cerebellar signs, gait is cautious, 5/5 strength in lower extremities Lungs: No accessory muscle use Psych: Age appropriate judgment and insight, normal affect and mood  Assessment and Plan: Spasticity  Chronic, uncontrolled.  Increase dantrolene from 50 mg 3 times daily to 100 mg 3 times daily.  Send measures of having issues.  I will see him in 1 month to recheck and to do a physical. The patient voiced understanding and agreement to the plan.  [name] [place], DO 04/16/23  1:42 PM

## 2023-04-16 NOTE — Patient Instructions (Addendum)
Do you Cologuard please.   Let us know if you need anything.

## 2023-04-19 DIAGNOSIS — Z008 Encounter for other general examination: Secondary | ICD-10-CM | POA: Diagnosis not present

## 2023-04-19 DIAGNOSIS — R2681 Unsteadiness on feet: Secondary | ICD-10-CM | POA: Diagnosis not present

## 2023-04-19 DIAGNOSIS — G610 Guillain-Barre syndrome: Secondary | ICD-10-CM | POA: Diagnosis not present

## 2023-04-19 DIAGNOSIS — R7303 Prediabetes: Secondary | ICD-10-CM | POA: Diagnosis not present

## 2023-04-19 DIAGNOSIS — Z6832 Body mass index (BMI) 32.0-32.9, adult: Secondary | ICD-10-CM | POA: Diagnosis not present

## 2023-04-19 DIAGNOSIS — E669 Obesity, unspecified: Secondary | ICD-10-CM | POA: Diagnosis not present

## 2023-05-15 ENCOUNTER — Ambulatory Visit: Payer: No Typology Code available for payment source | Admitting: Family Medicine

## 2023-07-24 ENCOUNTER — Telehealth: Payer: Self-pay | Admitting: Family Medicine

## 2023-07-24 NOTE — Telephone Encounter (Signed)
 Copied from CRM [id number]. Topic: Medicare AWV >> Jul 24, 2023 11:24 AM Juliana Ocean wrote: Reason for CRM: LVM 07/24/2023 to schedule AWV. Please schedule Virtual or Telehealth visits ONLY  Rosalee Collins; Care Guide Ambulatory Clinical Support [place] [facility] Direct Dial: [phone]

## 2023-10-03 ENCOUNTER — Telehealth: Payer: Self-pay | Admitting: Family Medicine

## 2023-10-03 NOTE — Telephone Encounter (Signed)
 Copied from CRM [id number]. Topic: Medicare AWV >> Oct 03, 2023  1:37 PM Nathanel DEL wrote: Reason for CRM: Called LVM 10/03/2023 to schedule AWV. Please schedule Virtual or Telehealth visits ONLY.   Nathanel Paschal; Care Guide Ambulatory Clinical Support [place] [facility] Direct Dial: [phone]

## 2023-10-29 ENCOUNTER — Telehealth: Payer: Self-pay | Admitting: *Deleted

## 2023-10-29 ENCOUNTER — Ambulatory Visit (INDEPENDENT_AMBULATORY_CARE_PROVIDER_SITE_OTHER): Admitting: *Deleted

## 2023-10-29 VITALS — Ht 70.0 in | Wt 234.0 lb

## 2023-10-29 DIAGNOSIS — Z0000 Encounter for general adult medical examination without abnormal findings: Secondary | ICD-10-CM

## 2023-10-29 DIAGNOSIS — Z1211 Encounter for screening for malignant neoplasm of colon: Secondary | ICD-10-CM

## 2023-10-29 NOTE — Patient Instructions (Addendum)
 Jorge Weaver , Thank you for taking time out of your busy schedule to complete your Annual Wellness Visit with me. I enjoyed our conversation and look forward to speaking with you again next year. I, as well as your care team,  appreciate your ongoing commitment to your health goals. Please review the following plan we discussed and let me know if I can assist you in the future. Your Game plan/ To Do List   Referrals: If you haven't heard from the office you've been referred to, please reach out to them at the phone provided.   [facility] (colon consult):  [phone] Eye Exam:  See the list below of Providers in the [facility] area to reach out to.  Follow up Visits: Next Medicare AWV with our clinical staff: 10/30/24 3pm    Next Office Visit with your provider: 11/06/23 1pm.  Clinician Recommendations:  Aim for 30 minutes of exercise or brisk walking, 6-8 glasses of water, and 5 servings of fruits and vegetables each day.   You will need to get the following vaccines at your local pharmacy: Pneumonia, Hepatitis B.      This is a list of the screening recommended for you and due dates:  Health Maintenance  Topic Date Due   Medicare Annual Wellness Visit  Never done   Pneumococcal Vaccine for high risk medical condition (1 of 2 - PCV) Never done   Pneumococcal Vaccine for age over 1 (1 of 2 - PCV) Never done   Hepatitis B Vaccine (1 of 3 - 19+ 3-dose series) Never done   Colon Cancer Screening  Never done   DTaP/Tdap/Td vaccine (3 - Td or Tdap) 10/28/2024*   Flu Shot  10/28/2024*   Hepatitis C Screening  Completed   HIV Screening  Completed   HPV Vaccine  Aged Out   Meningitis B Vaccine  Aged Out   COVID-19 Vaccine  Discontinued   Zoster (Shingles) Vaccine  Discontinued  *Topic was postponed. The date shown is not the original due date.   I have mailed you a set of advanced directives. Once completed and notarized, you may return a copy of your Advanced Directive(s) by either of  the following:  Bring a copy of your health care power of attorney and living will to the office to be added to your chart at your convenience. You can mail a copy to [facility] [address] [place], [place] [address] or email to [email or web address]  Advance Care Planning is important because it:  [x]  Makes sure you receive the medical care that is consistent with your values, goals, and preferences  [x]  It provides guidance to your family and loved ones and reduces their decisional burden about whether or not they are making the right decisions based on your wishes.  Follow the link provided in your after visit summary or read over the paperwork we have mailed to you to help you started getting your Advance Directives in place. If you need assistance in completing these, please reach out to us  so that we can help you!  See attachments for Preventive Care and Fall Prevention Tips.   [facility] Doctor Locations  [facility]     [facility] [address]      [address]   [place], [place] [address]                 [place], [place] [address] Phone: [phone]  Phone:  [phone] Fax: [phone]      Fax:  [phone]  [facility]                           [facility] [address]     [address] [place], [place] [address]                [place], [place] [address] Phone: [phone]     Phone:  [phone] Fax: [phone]      Fax:  [phone]  [facility]       [address] [place], [place] [address] Phone:  [phone] Fax:  [phone]  [facility] [address] [place], [place] [address] [phone]  [facility] [address] [place], [place] [address] Phone: [phone] Fax: [phone]   [facility] [address]  [place], [place] [address] Phone:  [phone] Fax:   [phone]  [facility] [address]  [place], [place] [address] Phone: [phone] Fax:  [phone]

## 2023-10-29 NOTE — Patient Instructions (Addendum)
 [name] , Thank you for taking time out of your busy schedule to complete your Annual Wellness Visit with me. I enjoyed our conversation and look forward to speaking with you again next year. I, as well as your care team,  appreciate your ongoing commitment to your health goals. Please review the following plan we discussed and let me know if I can assist you in the future. Your Game plan/ To Do List   Referrals: If you haven't heard from the office you've been referred to, please reach out to them at the phone provided.   [facility] (colon consult):  [phone] Eye Exam:  See the list below of Providers in the [facility] area to reach out to.  Follow up Visits: Next Medicare AWV with our clinical staff: 10/30/24 3pm    Next Office Visit with your provider: 11/06/23 1pm.  Clinician Recommendations:  Aim for 30 minutes of exercise or brisk walking, 6-8 glasses of water, and 5 servings of fruits and vegetables each day.   You will need to get the following vaccines at your local pharmacy: Pneumonia, Hepatitis B.      This is a list of the screening recommended for you and due dates:  Health Maintenance  Topic Date Due   Medicare Annual Wellness Visit  Never done   Pneumococcal Vaccine for high risk medical condition (1 of 2 - PCV) Never done   Pneumococcal Vaccine for age over 1 (1 of 2 - PCV) Never done   Hepatitis B Vaccine (1 of 3 - 19+ 3-dose series) Never done   Colon Cancer Screening  Never done   DTaP/Tdap/Td vaccine (3 - Td or Tdap) 10/28/2024*   Flu Shot  10/28/2024*   Hepatitis C Screening  Completed   HIV Screening  Completed   HPV Vaccine  Aged Out   Meningitis B Vaccine  Aged Out   COVID-19 Vaccine  Discontinued   Zoster (Shingles) Vaccine  Discontinued  *Topic was postponed. The date shown is not the original due date.   I have mailed you a set of advanced directives. Once completed and notarized, you may return a copy of your Advanced Directive(s) by either of  the following:  Bring a copy of your health care power of attorney and living will to the office to be added to your chart at your convenience. You can mail a copy to [facility] [address] [place], [place] [address] or email to [email or web address]  Advance Care Planning is important because it:  [x]  Makes sure you receive the medical care that is consistent with your values, goals, and preferences  [x]  It provides guidance to your family and loved ones and reduces their decisional burden about whether or not they are making the right decisions based on your wishes.  Follow the link provided in your after visit summary or read over the paperwork we have mailed to you to help you started getting your Advance Directives in place. If you need assistance in completing these, please reach out to us  so that we can help you!  See attachments for Preventive Care and Fall Prevention Tips.   [facility] Doctor Locations  [facility]     [facility] [address]      [address]   [place], [place] [address]                 [place], [place] [address] Phone: [phone]  Phone:  [phone] Fax: [phone]      Fax:  [phone]  [facility]                           [facility] [address]     [address] [place], [place] [address]                [place], [place] [address] Phone: [phone]     Phone:  [phone] Fax: [phone]      Fax:  [phone]  [facility]       [address] [place], [place] [address] Phone:  [phone] Fax:  [phone]  [facility] [address] [place], [place] [address] [phone]  [facility] [address] [place], [place] [address] Phone: [phone] Fax: [phone]   [facility] [address]  [place], [place] [address] Phone:  [phone] Fax:   [phone]  [facility] [address]  [place], [place] [address] Phone: [phone] Fax:  [phone]

## 2023-10-29 NOTE — Progress Notes (Signed)
 Subjective:   Jorge Weaver. is a 58 y.o. who presents for a Medicare Wellness preventive visit.  As a reminder, Annual Wellness Visits don't include a physical exam, and some assessments may be limited, especially if this visit is performed virtually. We may recommend an in-person follow-up visit with your provider if needed.  Visit Complete: Virtual I connected with  Mohid Furuya. on 10/29/23 by a audio enabled telemedicine application and verified that I am speaking with the correct person using two identifiers.  Patient Location: Home  Provider Location: Office/Clinic  I discussed the limitations of evaluation and management by telemedicine. The patient expressed understanding and agreed to proceed.  Vital Signs: Because this visit was a virtual/telehealth visit, some criteria may be missing or patient reported. Any vitals not documented were not able to be obtained and vitals that have been documented are patient reported.  VideoDeclined- This patient declined Librarian, academic. Therefore the visit was completed with audio only.  Persons Participating in Visit: Patient.  AWV Questionnaire: No: Patient Medicare AWV questionnaire was not completed prior to this visit.  Cardiac Risk Factors include: advanced age (>12men, >30 women);hypertension;male gender     Objective:    Today's Vitals   10/29/23 1510  Weight: 234 lb (106.1 kg)  Height: 5' 10 (1.778 m)   Body mass index is 33.58 kg/m.     10/29/2023    3:48 PM 10/02/2021    9:48 PM 09/02/2019   12:44 PM 07/07/2019    7:51 PM 06/28/2019    3:02 AM 03/19/2019    1:16 PM 01/14/2019    9:21 AM  Advanced Directives  Does Patient Have a Medical Advance Directive? No No No No No No No  Would patient like information on creating a medical advance directive? Yes (MAU/Ambulatory/Procedural Areas - Information given)   No - Patient declined No - Patient declined No - Patient declined Yes  (MAU/Ambulatory/Procedural Areas - Information given)    Current Medications (verified) Outpatient Encounter Medications as of 10/29/2023  Medication Sig   dantrolene  (DANTRIUM ) 100 MG capsule Take 1 capsule (100 mg total) by mouth 3 (three) times daily. (Patient not taking: Reported on 10/29/2023)   No facility-administered encounter medications on file as of 10/29/2023.    Allergies (verified) Patient has no known allergies.   History: Past Medical History:  Diagnosis Date   Anemia    low iron   Asthma    as a child   Cervical spondylosis with myelopathy    Fatty liver    GERD (gastroesophageal reflux disease)    Guillain Barr syndrome (HCC)    Heart murmur    when he was younger    Hypertension    Pneumonia    Past Surgical History:  Procedure Laterality Date   ANTERIOR CERVICAL DECOMP/DISCECTOMY FUSION N/A 01/16/2019   Procedure: Cervical six corpectomy with Cervical five-seven fusion and plating;  Surgeon: Gillie Duncans, MD;  Location: [facility] OR;  Service: Neurosurgery;  Laterality: N/A;   NO PAST SURGERIES     WISDOM TOOTH EXTRACTION     WRIST SURGERY Left 2014   fx   Family History  Problem Relation Age of Onset   Hypertension Mother    Heart attack Father    Colon cancer Sister    Esophageal cancer Neg Hx    Rectal cancer Neg Hx    Stomach cancer Neg Hx    Social History   Socioeconomic History   Marital status: Married  Spouse name: Allen   Number of children: 5   Years of education: Not on file   Highest education level: Associate degree: occupational, Scientist, product/process development, or vocational program  Occupational History    Comment: electrician, heat/AC  Tobacco Use   Smoking status: Never   Smokeless tobacco: Never  Vaping Use   Vaping status: Never Used  Substance and Sexual Activity   Alcohol use: Yes    Alcohol/week: 3.0 standard drinks of alcohol    Types: 3 Cans of beer per week   Drug use: No   Sexual activity: Not on file  Other Topics Concern    Not on file  Social History Narrative   Lives with wife, child   Caffeine- occas   Right handed   One story home   Social Drivers of Health   Financial Resource Strain: Medium Risk (10/29/2023)   Overall Financial Resource Strain (CARDIA)    Difficulty of Paying Living Expenses: Somewhat hard  Food Insecurity: No Food Insecurity (10/29/2023)   Hunger Vital Sign    Worried About Running Out of Food in the Last Year: Never true    Ran Out of Food in the Last Year: Never true  Transportation Needs: No Transportation Needs (10/29/2023)   PRAPARE - Administrator, Civil Service (Medical): No    Lack of Transportation (Non-Medical): No  Physical Activity: Insufficiently Active (10/29/2023)   Exercise Vital Sign    Days of Exercise per Week: 5 days    Minutes of Exercise per Session: 20 min  Stress: No Stress Concern Present (10/29/2023)   [employer] of Occupational Health - Occupational Stress Questionnaire    Feeling of Stress: Not at all  Social Connections: Moderately Integrated (10/29/2023)   Social Connection and Isolation Panel    Frequency of Communication with Friends and Family: More than three times a week    Frequency of Social Gatherings with Friends and Family: More than three times a week    Attends Religious Services: More than 4 times per year    Active Member of [employer] or Organizations: No    Attends Engineer, structural: Never    Marital Status: Married    Tobacco Counseling Counseling given: Not Answered    Clinical Intake:  Pre-visit preparation completed: Yes  Pain : No/denies pain     BMI - recorded: 33.58 Nutritional Risks: None Diabetes: No  Lab Results  Component Value Date   HGBA1C 6.1 08/07/2017   HGBA1C 6.3 05/10/2017     How often do you need to have someone help you when you read instructions, pamphlets, or other written materials from your doctor or pharmacy?: 1 - Never What is the last grade level you completed  in school?: associate's degree  Interpreter Needed?: No  Information entered by :: Lolita Libra, CMA   Activities of Daily Living     10/29/2023    3:23 PM  In your present state of health, do you have any difficulty performing the following activities:  Hearing? 0  Vision? 0  Difficulty concentrating or making decisions? 0  Walking or climbing stairs? 0  Dressing or bathing? 0  Doing errands, shopping? 0  Preparing Food and eating ? N  Using the Toilet? N  In the past six months, have you accidently leaked urine? N  Do you have problems with loss of bowel control? N  Managing your Medications? N  Managing your Finances? N  Housekeeping or managing your Housekeeping? N  Patient Care Team: Frann Mabel Mt, DO as PCP - General (Family Medicine) Skeet Juliene SAUNDERS, DO as Consulting Physician (Neurology)  I have updated your Care Teams any recent Medical Services you may have received from other providers in the past year.     Assessment:   This is a routine wellness examination for Jaythen.  Hearing/Vision screen Hearing Screening - Comments:: Denies hearing difficulties.  Vision Screening - Comments:: Doesn't have doctor currently, mailed list to pt to choose.   Goals Addressed   None    Depression Screen     10/29/2023    3:20 PM 02/13/2023   11:06 AM 01/02/2023    3:34 PM 04/15/2021   10:34 AM 02/28/2021    9:59 AM 12/26/2019   10:07 AM 09/12/2019   11:29 AM  PHQ 2/9 Scores  PHQ - 2 Score 0 0 0 0 0 2 0  PHQ- 9 Score 3      0    Fall Risk     10/29/2023    3:14 PM 02/13/2023   11:06 AM 01/02/2023    3:36 PM 01/02/2023    3:34 PM 04/15/2021   10:34 AM  Fall Risk   Falls in the past year? 0 0 0 0 0  Number falls in past yr: 0 0 0 0 0  Injury with Fall? 0 0 0 0 0  Risk for fall due to :     No Fall Risks  Follow up Education provided Falls evaluation completed  Falls evaluation completed Falls evaluation completed      Data saved with a previous  flowsheet row definition    MEDICARE RISK AT HOME:  Medicare Risk at Home Any stairs in or around the home?: Yes If so, are there any without handrails?: Yes Home free of loose throw rugs in walkways, pet beds, electrical cords, etc?: Yes Adequate lighting in your home to reduce risk of falls?: Yes Life alert?: No Use of a cane, walker or w/c?: No Grab bars in the bathroom?: Yes Shower chair or bench in shower?: Yes Elevated toilet seat or a handicapped toilet?: No  TIMED UP AND GO:  Was the test performed?  No,audio  Cognitive Function: 6CIT completed        10/29/2023    3:23 PM  6CIT Screen  What Year? 0 points  What month? 0 points  What time? 0 points  Count back from 20 2 points  Months in reverse 0 points  Repeat phrase 4 points  Total Score 6 points    Immunizations Immunization History  Administered Date(s) Administered   Tdap 10/15/2013, 10/15/2013    Screening Tests Health Maintenance  Topic Date Due   Pneumococcal Vaccine: 19-49 Years (1 of 2 - PCV) Never done   Pneumococcal Vaccine: 50+ Years (1 of 2 - PCV) Never done   Hepatitis B Vaccines (1 of 3 - 19+ 3-dose series) Never done   Colonoscopy  Never done   DTaP/Tdap/Td (3 - Td or Tdap) 10/28/2024 (Originally 10/16/2023)   INFLUENZA VACCINE  10/28/2024 (Originally 10/19/2023)   Medicare Annual Wellness (AWV)  10/28/2024   Hepatitis C Screening  Completed   HIV Screening  Completed   HPV VACCINES  Aged Out   Meningococcal B Vaccine  Aged Out   COVID-19 Vaccine  Discontinued   Zoster Vaccines- Shingrix  Discontinued    Health Maintenance  Health Maintenance Due  Topic Date Due   Pneumococcal Vaccine: 19-49 Years (1 of 2 - PCV) Never  done   Pneumococcal Vaccine: 50+ Years (1 of 2 - PCV) Never done   Hepatitis B Vaccines (1 of 3 - 19+ 3-dose series) Never done   Colonoscopy  Never done   Health Maintenance Items Addressed: Referral placed for colonoscopy. Pt will consider getting pneumonia and  hepatitis B vaccines at pharmacy. Declines flu vaccine.  Additional Screening:  Vision Screening: Recommended annual ophthalmology exams for early detection of glaucoma and other disorders of the eye. Would you like a referral to an eye doctor? No    Dental Screening: Recommended annual dental exams for proper oral hygiene  Community Resource Referral / Chronic Care Management: CRR required this visit?  No   CCM required this visit?  No   Plan:    I have personally reviewed and noted the following in the patient's chart:   Medical and social history Use of alcohol, tobacco or illicit drugs  Current medications and supplements including opioid prescriptions. Patient is not currently taking opioid prescriptions. Functional ability and status Nutritional status Physical activity Advanced directives List of other physicians Hospitalizations, surgeries, and ER visits in previous 12 months Vitals Screenings to include cognitive, depression, and falls Referrals and appointments  In addition, I have reviewed and discussed with patient certain preventive protocols, quality metrics, and best practice recommendations. A written personalized care plan for preventive services as well as general preventive health recommendations were provided to patient.   Lolita Libra, CMA   10/29/2023   After Visit Summary: (Mail) Due to this being a telephonic visit, the after visit summary with patients personalized plan was offered to patient via mail   Notes: See phone note

## 2023-10-29 NOTE — Telephone Encounter (Signed)
 Pt had AWV today. He states he stopped Dantrolene  after about a month of taking it as he could not see any improvement of his symptoms. I have scheduled him for his f/u/cpe (recommended after visit in January) on 11/06/23 1:00pm.

## 2023-11-06 ENCOUNTER — Encounter: Admitting: Family Medicine

## 2024-01-28 ENCOUNTER — Encounter: Payer: Self-pay | Admitting: Family Medicine

## 2024-04-16 ENCOUNTER — Ambulatory Visit: Payer: Self-pay

## 2024-04-16 NOTE — Telephone Encounter (Signed)
 This RN notes unable to triage patient due to connection issue.  Alternate triager to call patient back (team has been notified).      Message from Jorge Weaver sent at 04/16/2024  2:44 PM EST  Reason for Triage: wheezing, congestion, colored mucus

## 2024-04-16 NOTE — Telephone Encounter (Signed)
 Appt scheduled

## 2024-04-16 NOTE — Telephone Encounter (Signed)
 FYI Only or Action Required?: FYI only for provider: appointment scheduled on 2/2.  Patient was last seen in primary care on 04/16/2023 by Frann Mabel Mt, DO.  Called Nurse Triage reporting Wheezing and Cough.  Symptoms began a week ago.  Interventions attempted: Nothing.  Symptoms are: unchanged.  Triage Disposition: See HCP Within 4 Hours (Or PCP Triage)  Patient/caregiver understands and will follow disposition?: Yes      Message from [facility] sent at 04/16/2024  2:44 PM EST  Reason for Triage: wheezing, congestion, colored mucus   Reason for Disposition  Wheezing is present  Answer Assessment - Initial Assessment Questions 1. ONSET: When did the cough begin?      X 1 week   2. SEVERITY: How bad is the cough today?      Intermittent   3. SPUTUM: Describe the color of your sputum (e.g., none, dry cough; clear, white, yellow, green)     Yes, unable to expel.   4. HEMOPTYSIS: Are you coughing up any blood? If Yes, ask: How much? (e.g., flecks, streaks, tablespoons, etc.)     No   5. DIFFICULTY BREATHING: Are you having difficulty breathing? If Yes, ask: How bad is it? (e.g., mild, moderate, severe)      No   6. FEVER: Do you have a fever? If Yes, ask: What is your temperature, how was it measured, and when did it start?     No   7. CARDIAC HISTORY: Do you have any history of heart disease? (e.g., heart attack, congestive heart failure)      HTN    8. LUNG HISTORY: Do you have any history of lung disease?  (e.g., pulmonary embolus, asthma, emphysema)     Asthma       10. OTHER SYMPTOMS: Do you have any other symptoms? (e.g., runny nose, wheezing, chest pain)       Wheezing-intermittent with cough    Patient called in to triage with complaints of cough, wheezing  This has been ongoing for one week.  For home care, the patient is not taking any OTC medications.   Appointment scheduled for further evaluation on 2/2. A sooner  appointment was offered, patient declined and requested 2/2. Patient agrees with the plan of care, and will reach out if symptoms worsen or persist.  Protocols used: Cough - Acute Productive-A-AH

## 2024-04-21 ENCOUNTER — Ambulatory Visit: Admitting: Family Medicine

## 2024-10-30 ENCOUNTER — Ambulatory Visit
